# Patient Record
Sex: Female | Born: 1960
Health system: Southern US, Community
[De-identification: ages and names within clinical notes are randomized; demographics above are authoritative.]

## PROBLEM LIST (undated history)

## (undated) DIAGNOSIS — G930 Cerebral cysts: Secondary | ICD-10-CM

## (undated) DIAGNOSIS — M1990 Unspecified osteoarthritis, unspecified site: Secondary | ICD-10-CM

## (undated) DIAGNOSIS — Z98811 Dental restoration status: Secondary | ICD-10-CM

## (undated) DIAGNOSIS — R1310 Dysphagia, unspecified: Secondary | ICD-10-CM

## (undated) DIAGNOSIS — F419 Anxiety disorder, unspecified: Secondary | ICD-10-CM

## (undated) DIAGNOSIS — K219 Gastro-esophageal reflux disease without esophagitis: Secondary | ICD-10-CM

## (undated) DIAGNOSIS — J302 Other seasonal allergic rhinitis: Secondary | ICD-10-CM

## (undated) DIAGNOSIS — Z923 Personal history of irradiation: Secondary | ICD-10-CM

## (undated) DIAGNOSIS — C50919 Malignant neoplasm of unspecified site of unspecified female breast: Secondary | ICD-10-CM

## (undated) DIAGNOSIS — M810 Age-related osteoporosis without current pathological fracture: Secondary | ICD-10-CM

## (undated) DIAGNOSIS — H409 Unspecified glaucoma: Secondary | ICD-10-CM

## (undated) DIAGNOSIS — F458 Other somatoform disorders: Secondary | ICD-10-CM

## (undated) DIAGNOSIS — R198 Other specified symptoms and signs involving the digestive system and abdomen: Secondary | ICD-10-CM

## (undated) DIAGNOSIS — Z9221 Personal history of antineoplastic chemotherapy: Secondary | ICD-10-CM

## (undated) DIAGNOSIS — E039 Hypothyroidism, unspecified: Secondary | ICD-10-CM

## (undated) DIAGNOSIS — T4145XA Adverse effect of unspecified anesthetic, initial encounter: Secondary | ICD-10-CM

## (undated) DIAGNOSIS — G43909 Migraine, unspecified, not intractable, without status migrainosus: Secondary | ICD-10-CM

## (undated) DIAGNOSIS — M84376A Stress fracture, unspecified foot, initial encounter for fracture: Secondary | ICD-10-CM

## (undated) HISTORY — DX: Unspecified osteoarthritis, unspecified site: M19.90

## (undated) HISTORY — DX: Gastro-esophageal reflux disease without esophagitis: K21.9

## (undated) HISTORY — DX: Malignant neoplasm of unspecified site of unspecified female breast: C50.919

## (undated) HISTORY — PX: BREAST LUMPECTOMY: SHX2

## (undated) HISTORY — PX: TONSILLECTOMY: SUR1361

## (undated) HISTORY — PX: ESOPHAGOGASTRODUODENOSCOPY ENDOSCOPY: SHX5814

## (undated) HISTORY — PX: LASIK: SHX215

## (undated) HISTORY — PX: TOOTH EXTRACTION: SUR596

## (undated) HISTORY — DX: Cerebral cysts: G93.0

## (undated) HISTORY — DX: Age-related osteoporosis without current pathological fracture: M81.0

---

## 1997-10-13 ENCOUNTER — Other Ambulatory Visit: Admission: RE | Admit: 1997-10-13 | Discharge: 1997-10-13 | Payer: Self-pay | Admitting: *Deleted

## 1998-02-09 ENCOUNTER — Other Ambulatory Visit: Admission: RE | Admit: 1998-02-09 | Discharge: 1998-02-09 | Payer: Self-pay | Admitting: *Deleted

## 1998-09-14 ENCOUNTER — Other Ambulatory Visit: Admission: RE | Admit: 1998-09-14 | Discharge: 1998-09-14 | Payer: Self-pay | Admitting: *Deleted

## 1999-02-01 ENCOUNTER — Other Ambulatory Visit: Admission: RE | Admit: 1999-02-01 | Discharge: 1999-02-01 | Payer: Self-pay | Admitting: *Deleted

## 1999-05-31 ENCOUNTER — Other Ambulatory Visit: Admission: RE | Admit: 1999-05-31 | Discharge: 1999-05-31 | Payer: Self-pay | Admitting: *Deleted

## 1999-11-29 ENCOUNTER — Other Ambulatory Visit: Admission: RE | Admit: 1999-11-29 | Discharge: 1999-11-29 | Payer: Self-pay | Admitting: *Deleted

## 2000-05-22 ENCOUNTER — Other Ambulatory Visit: Admission: RE | Admit: 2000-05-22 | Discharge: 2000-05-22 | Payer: Self-pay | Admitting: *Deleted

## 2000-06-20 ENCOUNTER — Other Ambulatory Visit: Admission: RE | Admit: 2000-06-20 | Discharge: 2000-06-20 | Payer: Self-pay | Admitting: *Deleted

## 2000-06-20 ENCOUNTER — Encounter (INDEPENDENT_AMBULATORY_CARE_PROVIDER_SITE_OTHER): Payer: Self-pay | Admitting: Specialist

## 2001-03-12 ENCOUNTER — Other Ambulatory Visit: Admission: RE | Admit: 2001-03-12 | Discharge: 2001-03-12 | Payer: Self-pay | Admitting: *Deleted

## 2001-09-03 ENCOUNTER — Other Ambulatory Visit: Admission: RE | Admit: 2001-09-03 | Discharge: 2001-09-03 | Payer: Self-pay | Admitting: *Deleted

## 2002-10-11 ENCOUNTER — Other Ambulatory Visit: Admission: RE | Admit: 2002-10-11 | Discharge: 2002-10-11 | Payer: Self-pay | Admitting: *Deleted

## 2003-08-19 ENCOUNTER — Ambulatory Visit (HOSPITAL_COMMUNITY): Admission: RE | Admit: 2003-08-19 | Discharge: 2003-08-19 | Payer: Self-pay | Admitting: *Deleted

## 2003-10-27 ENCOUNTER — Other Ambulatory Visit: Admission: RE | Admit: 2003-10-27 | Discharge: 2003-10-27 | Payer: Self-pay | Admitting: *Deleted

## 2004-10-04 ENCOUNTER — Ambulatory Visit (HOSPITAL_COMMUNITY): Admission: RE | Admit: 2004-10-04 | Discharge: 2004-10-04 | Payer: Self-pay | Admitting: *Deleted

## 2004-11-10 ENCOUNTER — Other Ambulatory Visit: Admission: RE | Admit: 2004-11-10 | Discharge: 2004-11-10 | Payer: Self-pay | Admitting: *Deleted

## 2005-10-12 ENCOUNTER — Ambulatory Visit (HOSPITAL_COMMUNITY): Admission: RE | Admit: 2005-10-12 | Discharge: 2005-10-12 | Payer: Self-pay | Admitting: *Deleted

## 2005-12-12 ENCOUNTER — Other Ambulatory Visit: Admission: RE | Admit: 2005-12-12 | Discharge: 2005-12-12 | Payer: Self-pay | Admitting: Obstetrics and Gynecology

## 2006-10-20 ENCOUNTER — Ambulatory Visit (HOSPITAL_COMMUNITY): Admission: RE | Admit: 2006-10-20 | Discharge: 2006-10-20 | Payer: Self-pay | Admitting: Obstetrics and Gynecology

## 2006-12-15 ENCOUNTER — Other Ambulatory Visit: Admission: RE | Admit: 2006-12-15 | Discharge: 2006-12-15 | Payer: Self-pay | Admitting: Obstetrics & Gynecology

## 2006-12-22 ENCOUNTER — Ambulatory Visit (HOSPITAL_COMMUNITY): Admission: RE | Admit: 2006-12-22 | Discharge: 2006-12-22 | Payer: Self-pay | Admitting: Obstetrics & Gynecology

## 2007-11-02 ENCOUNTER — Ambulatory Visit (HOSPITAL_COMMUNITY): Admission: RE | Admit: 2007-11-02 | Discharge: 2007-11-02 | Payer: Self-pay | Admitting: Obstetrics & Gynecology

## 2007-11-21 LAB — CONVERTED CEMR LAB: Pap Smear: NORMAL

## 2007-12-17 ENCOUNTER — Other Ambulatory Visit: Admission: RE | Admit: 2007-12-17 | Discharge: 2007-12-17 | Payer: Self-pay | Admitting: Obstetrics and Gynecology

## 2008-10-15 ENCOUNTER — Encounter: Admission: RE | Admit: 2008-10-15 | Discharge: 2008-10-15 | Payer: Self-pay | Admitting: Specialist

## 2008-11-03 ENCOUNTER — Ambulatory Visit (HOSPITAL_COMMUNITY): Admission: RE | Admit: 2008-11-03 | Discharge: 2008-11-03 | Payer: Self-pay | Admitting: Obstetrics and Gynecology

## 2008-11-07 ENCOUNTER — Encounter: Payer: Self-pay | Admitting: Internal Medicine

## 2009-01-13 ENCOUNTER — Ambulatory Visit: Payer: Self-pay | Admitting: Internal Medicine

## 2009-01-13 DIAGNOSIS — R519 Headache, unspecified: Secondary | ICD-10-CM | POA: Insufficient documentation

## 2009-01-13 DIAGNOSIS — R51 Headache: Secondary | ICD-10-CM | POA: Insufficient documentation

## 2009-01-13 DIAGNOSIS — J309 Allergic rhinitis, unspecified: Secondary | ICD-10-CM | POA: Insufficient documentation

## 2009-01-13 DIAGNOSIS — H409 Unspecified glaucoma: Secondary | ICD-10-CM | POA: Insufficient documentation

## 2009-01-21 ENCOUNTER — Ambulatory Visit: Payer: Self-pay | Admitting: Internal Medicine

## 2009-01-21 LAB — CONVERTED CEMR LAB
ALT: 19 units/L (ref 0–35)
AST: 22 units/L (ref 0–37)
Albumin: 3.7 g/dL (ref 3.5–5.2)
Alkaline Phosphatase: 43 units/L (ref 39–117)
BUN: 13 mg/dL (ref 6–23)
Basophils Relative: 0.3 % (ref 0.0–3.0)
Bilirubin, Direct: 0 mg/dL (ref 0.0–0.3)
CO2: 30 meq/L (ref 19–32)
Calcium: 8.5 mg/dL (ref 8.4–10.5)
Chloride: 108 meq/L (ref 96–112)
Cholesterol: 166 mg/dL (ref 0–200)
Creatinine, Ser: 0.7 mg/dL (ref 0.4–1.2)
Eosinophils Relative: 2.4 % (ref 0.0–5.0)
GFR calc non Af Amer: 95.01 mL/min (ref >60)
Glucose, Bld: 90 mg/dL (ref 70–99)
HCT: 39.7 % (ref 36.0–46.0)
HDL: 57.7 mg/dL (ref >39.00)
Hemoglobin: 13.6 g/dL (ref 12.0–15.0)
LDL Cholesterol: 95 mg/dL (ref 0–99)
Lymphocytes Relative: 30.8 % (ref 12.0–46.0)
MCHC: 34.2 g/dL (ref 30.0–36.0)
MCV: 95.8 fL (ref 78.0–100.0)
Monocytes Relative: 7.8 % (ref 3.0–12.0)
Neutrophils Relative %: 58.7 % (ref 43.0–77.0)
Platelets: 285 10*3/uL (ref 150.0–400.0)
Potassium: 4.1 meq/L (ref 3.5–5.1)
RBC: 4.15 M/uL (ref 3.87–5.11)
RDW: 13.6 % (ref 11.5–14.6)
Sodium: 140 meq/L (ref 135–145)
TSH: 0.73 microintl units/mL (ref 0.35–5.50)
Total Bilirubin: 1.1 mg/dL (ref 0.3–1.2)
Total CHOL/HDL Ratio: 3
Total Protein: 6.9 g/dL (ref 6.0–8.3)
Triglycerides: 66 mg/dL (ref 0.0–149.0)
VLDL: 13.2 mg/dL (ref 0.0–40.0)
WBC: 5.2 10*3/uL (ref 4.5–10.5)

## 2009-01-27 LAB — CONVERTED CEMR LAB: Varicella IgG: 2.67 — ABNORMAL HIGH

## 2009-03-10 ENCOUNTER — Ambulatory Visit: Payer: Self-pay | Admitting: Internal Medicine

## 2009-03-10 DIAGNOSIS — N390 Urinary tract infection, site not specified: Secondary | ICD-10-CM | POA: Insufficient documentation

## 2009-03-10 LAB — CONVERTED CEMR LAB
Bilirubin Urine: NEGATIVE
Glucose, Urine, Semiquant: NEGATIVE
Ketones, urine, test strip: NEGATIVE
Nitrite: NEGATIVE
Protein, U semiquant: NEGATIVE
Specific Gravity, Urine: 1.01
Urobilinogen, UA: 0.2
pH: 5.5

## 2009-03-23 ENCOUNTER — Ambulatory Visit: Payer: Self-pay | Admitting: Internal Medicine

## 2009-03-23 LAB — CONVERTED CEMR LAB
Bilirubin Urine: NEGATIVE
Glucose, Urine, Semiquant: NEGATIVE
Ketones, urine, test strip: NEGATIVE
Nitrite: NEGATIVE
Protein, U semiquant: NEGATIVE
Specific Gravity, Urine: 1.01
Urobilinogen, UA: 0.2
WBC Urine, dipstick: NEGATIVE
pH: 6

## 2009-05-17 ENCOUNTER — Emergency Department (HOSPITAL_COMMUNITY): Admission: EM | Admit: 2009-05-17 | Discharge: 2009-05-17 | Payer: Self-pay | Admitting: Emergency Medicine

## 2009-06-26 ENCOUNTER — Ambulatory Visit: Payer: Self-pay | Admitting: Internal Medicine

## 2009-07-31 ENCOUNTER — Ambulatory Visit: Payer: Self-pay | Admitting: Family Medicine

## 2009-07-31 LAB — CONVERTED CEMR LAB
Bilirubin Urine: NEGATIVE
Blood in Urine, dipstick: NEGATIVE
Glucose, Urine, Semiquant: NEGATIVE
Ketones, urine, test strip: NEGATIVE
Nitrite: NEGATIVE
Protein, U semiquant: NEGATIVE
Specific Gravity, Urine: 1.01
Urobilinogen, UA: 0.2
pH: 5.5

## 2009-08-01 ENCOUNTER — Encounter: Payer: Self-pay | Admitting: Internal Medicine

## 2009-10-05 ENCOUNTER — Ambulatory Visit: Payer: Self-pay | Admitting: Internal Medicine

## 2009-10-05 DIAGNOSIS — M549 Dorsalgia, unspecified: Secondary | ICD-10-CM | POA: Insufficient documentation

## 2009-10-05 LAB — CONVERTED CEMR LAB
Bilirubin Urine: NEGATIVE
Glucose, Urine, Semiquant: NEGATIVE
Ketones, urine, test strip: NEGATIVE
Nitrite: NEGATIVE
Protein, U semiquant: NEGATIVE
Specific Gravity, Urine: 1.01
Urobilinogen, UA: 0.2
pH: 7

## 2009-10-23 ENCOUNTER — Telehealth: Payer: Self-pay | Admitting: *Deleted

## 2009-11-06 ENCOUNTER — Ambulatory Visit (HOSPITAL_COMMUNITY): Admission: RE | Admit: 2009-11-06 | Discharge: 2009-11-06 | Payer: Self-pay | Admitting: Obstetrics and Gynecology

## 2010-01-04 ENCOUNTER — Ambulatory Visit: Payer: Self-pay | Admitting: Internal Medicine

## 2010-04-22 ENCOUNTER — Ambulatory Visit: Payer: Self-pay | Admitting: Sports Medicine

## 2010-04-22 DIAGNOSIS — M25539 Pain in unspecified wrist: Secondary | ICD-10-CM | POA: Insufficient documentation

## 2010-04-22 DIAGNOSIS — M84376A Stress fracture, unspecified foot, initial encounter for fracture: Secondary | ICD-10-CM | POA: Insufficient documentation

## 2010-07-12 ENCOUNTER — Ambulatory Visit: Payer: Self-pay | Admitting: Internal Medicine

## 2010-07-12 DIAGNOSIS — R1310 Dysphagia, unspecified: Secondary | ICD-10-CM | POA: Insufficient documentation

## 2010-07-12 DIAGNOSIS — K219 Gastro-esophageal reflux disease without esophagitis: Secondary | ICD-10-CM

## 2010-07-12 HISTORY — DX: Gastro-esophageal reflux disease without esophagitis: K21.9

## 2010-07-13 ENCOUNTER — Encounter: Payer: Self-pay | Admitting: Gastroenterology

## 2010-07-21 ENCOUNTER — Encounter (INDEPENDENT_AMBULATORY_CARE_PROVIDER_SITE_OTHER): Payer: Self-pay | Admitting: *Deleted

## 2010-07-21 ENCOUNTER — Ambulatory Visit: Payer: Self-pay | Admitting: Gastroenterology

## 2010-07-21 DIAGNOSIS — R110 Nausea: Secondary | ICD-10-CM | POA: Insufficient documentation

## 2010-08-22 DIAGNOSIS — T8859XA Other complications of anesthesia, initial encounter: Secondary | ICD-10-CM

## 2010-08-22 HISTORY — DX: Other complications of anesthesia, initial encounter: T88.59XA

## 2010-09-06 ENCOUNTER — Ambulatory Visit: Admit: 2010-09-06 | Payer: Self-pay | Admitting: Gastroenterology

## 2010-09-23 NOTE — Progress Notes (Signed)
Summary: antibiotic yet  Phone Note Call from Patient Call back at Work Phone [phone]   Call For: Bonnie Ward Summary of Call: Dr. Demetrius Charity to be figuring out antibiotic for me.  Has she done it?  [facility] Initial call taken by: Rudy Jew, RN,  October 23, 2009 2:21 PM  Follow-up for Phone Call        Left message for pt call back. I also spoke with Gwenn to see if culture was ordered. She is checking on this. Follow-up by: Romualdo Bolk, CMA Duncan Dull),  October 23, 2009 2:32 PM  Additional Follow-up for Phone Call Additional follow up Details #1::        Culture wasn't ordered in EMR therefore it was not done. Additional Follow-up by: Rita Ohara,  October 23, 2009 3:06 PM    Additional Follow-up for Phone Call Additional follow up Details #2::    Left message for pt to call back to give [place] a update on her symptoms. Romualdo Bolk, CMA (AAMA)  October 23, 2009 3:58 PM LMTOCB Romualdo Bolk, CMA Duncan Dull)  October 28, 2009 11:32 AM Pt called back saying that she is not having any symptoms but is wondering if we were going to call in a antibiotic to take occ. for uti's. Follow-up by: Romualdo Bolk, CMA Duncan Dull),  October 28, 2009 1:46 PM  Additional Follow-up for Phone Call Additional follow up Details #3:: Details for Additional Follow-up Action Taken: apologize for the delay. Last germ had resistance  so go ahead and use macrobid 100  1 by mouth with  IC for uti prevention. disp 30 refill x 3   if gets new uti symptom would recheck her then . Additional Follow-up by: Madelin Headings MD,  October 29, 2009 8:24 AM  New/Updated Medications: NITROFURANTOIN MONOHYD MACRO 100 MG CAPS (NITROFURANTOIN MONOHYD MACRO) 1 by mouth once daily as needed for prevention of uti Prescriptions: NITROFURANTOIN MONOHYD MACRO 100 MG CAPS (NITROFURANTOIN MONOHYD MACRO) 1 by mouth once daily as needed for prevention of uti  #30 x 3   Entered by:   Romualdo Bolk, CMA (AAMA)   Authorized  by:   Madelin Headings MD   Signed by:   Romualdo Bolk, CMA (AAMA) on 10/29/2009   Method used:   Electronically to        [facility]. [address] [phone]* (retail)       [address]       [place], [place]  [address]       Ph: [phone] or [phone]       Fax: [phone]   RxID:   [id number]  Left message on machine that rx has been called into pharmacy. Romualdo Bolk, CMA Duncan Dull)  October 29, 2009 9:56 AM

## 2010-09-23 NOTE — Letter (Signed)
Summary: [facility] Instructions  [facility]  [address] [place], [place] [address]   Phone: [phone]  Fax: [phone]       Bonnie Ward    1960/10/24    MRN: [id number]        Procedure Day /Date:09/06/10  MON     Arrival Time:1 pm      Procedure Time:2 pm     Location of Procedure:                    X  [facility] (4th Floor)   PREPARATION FOR COLONOSCOPY WITH MOVIPREP   Starting 5 days prior to your procedure 09/01/10 do not eat nuts, seeds, popcorn, corn, beans, peas,  salads, or any raw vegetables.  Do not take any fiber supplements (e.g. Metamucil, Citrucel, and Benefiber).  THE DAY BEFORE YOUR PROCEDURE         DATE: 09/05/10  DAY: SUN  1.  Drink clear liquids the entire day-NO SOLID FOOD  2.  Do not drink anything colored red or purple.  Avoid juices with pulp.  No orange juice.  3.  Drink at least 64 oz. (8 glasses) of fluid/clear liquids during the day to prevent dehydration and help the prep work efficiently.  CLEAR LIQUIDS INCLUDE: Water Jello Ice Popsicles Tea (sugar ok, no milk/cream) Powdered fruit flavored drinks Coffee (sugar ok, no milk/cream) Gatorade Juice: apple, white grape, white cranberry  Lemonade Clear bullion, consomm, broth Carbonated beverages (any kind) Strained chicken noodle soup Hard Candy                             4.  In the morning, mix first dose of MoviPrep solution:    Empty 1 Pouch A and 1 Pouch B into the disposable container    Add lukewarm drinking water to the top line of the container. Mix to dissolve    Refrigerate (mixed solution should be used within 24 hrs)  5.  Begin drinking the prep at 5:00 p.m. The MoviPrep container is divided by 4 marks.   Every 15 minutes drink the solution down to the next mark (approximately 8 oz) until the full liter is complete.   6.  Follow completed prep with 16 oz of clear liquid of your choice (Nothing red or purple).  Continue to drink clear  liquids until bedtime.  7.  Before going to bed, mix second dose of MoviPrep solution:    Empty 1 Pouch A and 1 Pouch B into the disposable container    Add lukewarm drinking water to the top line of the container. Mix to dissolve    Refrigerate  THE DAY OF YOUR PROCEDURE      DATE: 09/06/10 DAY: MON  Beginning at 9 a.m. (5 hours before procedure):         1. Every 15 minutes, drink the solution down to the next mark (approx 8 oz) until the full liter is complete.  2. Follow completed prep with 16 oz. of clear liquid of your choice.    3. You may drink clear liquids until 12 noon (2 HOURS BEFORE PROCEDURE).   MEDICATION INSTRUCTIONS  Unless otherwise instructed, you should take regular prescription medications with a small sip of water   as early as possible the morning of your procedure.           OTHER INSTRUCTIONS  You will need a responsible adult at least  50 years of age to accompany you and drive you home.   This person must remain in the waiting room during your procedure.  Wear loose fitting clothing that is easily removed.  Leave jewelry and other valuables at home.  However, you may wish to bring a book to read or  an iPod/MP3 player to listen to music as you wait for your procedure to start.  Remove all body piercing jewelry and leave at home.  Total time from sign-in until discharge is approximately 2-3 hours.  You should go home directly after your procedure and rest.  You can resume normal activities the  day after your procedure.  The day of your procedure you should not:   Drive   Make legal decisions   Operate machinery   Drink alcohol   Return to work  You will receive specific instructions about eating, activities and medications before you leave.    The above instructions have been reviewed and explained to me by   _______________________    I fully understand and can verbalize these instructions _____________________________  Date _________

## 2010-09-23 NOTE — Assessment & Plan Note (Signed)
Summary: POSSIBLE STRESS FX LEFT FOOT TOP/ R WRIST PAIN X 1 WK   Vital Signs:  Patient profile:   50 year old female Menstrual status:  irregular BP sitting:   136 / 83  Vitals Entered By: Lillia Pauls CMA (April 22, 2010 8:35 AM)  History of Present Illness: 50yo female with c/o L foot & R wrist/hand pain. L foot/2nd MT pain since 02/22/10 when did walk/runs 3-days in a row. To [facility] a few weeks ago, dx with likely stress fx.  Wore Post-op shoe 2-3 weeks, but not wearing any more. Notes swelling of foot that is worse after end of the day.  Notes splaying of toes.  Denies numbness or tingling of the feet. Has been doing spin classes & palates without discomfort.  On her feet all day as hair dressor. Has been using tylenol. Similar episode about 1-year ago that lasted about 70-month but spontaneously resolved. Hx of broken wrist 2-years ago at step class when fell.  No hx stress fx. Hx of osteopenia based on DEXA scan 2-yrs ago - taking Calcium 1200mg  & Vit D 2000 units daily.  R wrist pain/swelling x 1-wk.  Pt is R handed & works as Producer, television/film/video.Marland Kitchen Hx of fx 2-yrs ago. No recent injury/trauma. Notes dorsal swelling on ulnar aspect of hand. No numbness/tingling.  No weakness. Not taking any medications. Not using any brace. Aggrevating her at work & with daily activities.   Allergies: No Known Drug Allergies  Review of Systems      See HPI  Physical Exam  General:  Well-developed,well-nourished,in no acute distress; alert,appropriate and cooperative throughout examination Msk:  L FOOT: mod forefoot collapse.  Small amount of swelling over dorsal aspect 2nd & 3rd MT.  TTP over distal 2nd MT, mild TTP over distal 3rd MT.  No tenderness over MT heads on plantar aspect of foot.  Rt foot without swelling, tenderness, defomity.  She has normal stability of ankles b/l.  Normal ankle ROM & strength.  R WRIST: mild amount of swelling over ulnar styloid.  Full ROM without pain.  Mild  TTP over ulnar styloid & over tendons of extensor carpi ulnaris.  No tenderness over TFCC.  Wrist strength +5/5, but did have some pain with resisted ulnar deviation & wrist extension. Left wrist with FROM without pain, swelling, tenderness, weakness. Pulses:  +2/4 radial b/l Neurologic:  sensation intact to light touch.   Additional Exam:  MSK U/S - L foot: evidence of healing 2nd MT stress fx (approx 90% healed), (+)cap sign on transverse view with good callous formation.  No significant increased blood flow visualized on doppler.  3rd MT with mild irregularity, no clear fx.  Small amount of surrounding edema at 3rd MT head.  Images saved.   Impression & Recommendations:  Problem # 1:  STRESS FRACTURE OF THE METATARSALS (ZOX-096.04) Assessment New  - Lt 2nd MT stress fx, DOI  ~02/22/10, approx 90% healed at this time, confirmed with MSK u/s today - Fitted with arch strap in office today - Small MT pads placed in sandles.  Small MT pads placed on Sports Insoles, but pt does not have shoes here in the office to confirm fit.  Should return to office if insoles do not fit in shoes or if need MT pads adjusted. - Cont. Ca + Vit D - f/u 4-wks  Orders: [place] LIMITED ([address]) Foot Orthosis ( Arch Strap/Heel Cup) [phone]) Foot Insert [phone]) Sports Insoles [phone])  Problem # 2:  FOOT  PAIN, LEFT (ICD-729.5)  - Secondary to stress fx.  See above  Orders: [place] LIMITED ([address]) Foot Orthosis ( Arch Strap/Heel Cup) [phone]) Foot Insert (U9811) Sports Insoles [phone])  Problem # 3:  WRIST PAIN, RIGHT (ICD-719.43) - Exam c/w mild tendonitis of Extensor Carpi Ulnaris - Fitted with Wrist thumb loop brace in office today.  Should wear at work, ok to get wet. - Reviewed hand/wrist exercises to do after work with light weight - wrist extensions, supination/pronation exercises. - May use tylenol prn  Orders: Upper Limb Ortosis (wrist loop, arm band, finger splint) (G9562)  Complete Medication List: 1)   Maxalt-mlt 10 Mg Tbdp (Rizatriptan benzoate) .Marland Kitchen.. 1 by mouth at onset of migraine  can repeat in 2-4 hours 2)  Nitrofurantoin Monohyd Macro 100 Mg Caps (Nitrofurantoin monohyd macro) .Marland Kitchen.. 1 by mouth once daily as needed for prevention of uti 3)  Amoxicillin-pot Clavulanate 875-125 Mg Tabs (Amoxicillin-pot clavulanate) .Marland Kitchen.. 1 by mouth two times a day  for sinustis 4)  Fluticasone Propionate 50 Mcg/act Susp (Fluticasone propionate) .... 2 sprays each nares once daily  for allergy control 5)  Fluconazole 150 Mg Tabs (Fluconazole) .Marland Kitchen.. 1 by mouth and can repeat in 3 days

## 2010-09-23 NOTE — Letter (Signed)
Summary: New Patient letter  [facility]  [address] [place], [place] [address]   Phone: [phone]  Fax: [phone]       07/13/2010 MRN: [id number]  [facility] [address] [place], [place]  [address]  Dear Ms. Cardin,  Welcome to the Gastroenterology Division at [facility].    You are scheduled to see Dr.  Christella Hartigan on 07-21-10 at 9:15am on the 3rd floor at [facility], [address]  We ask that you try to arrive at our office 15 minutes prior to your appointment time to allow for check-in.  We would like you to complete the enclosed self-administered evaluation form prior to your visit and bring it with you on the day of your appointment.  We will review it with you.  Also, please bring a complete list of all your medications or, if you prefer, bring the medication bottles and we will list them.  Please bring your insurance card so that we may make a copy of it.  If your insurance requires a referral to see a specialist, please bring your referral form from your primary care physician.  Co-payments are due at the time of your visit and may be paid by cash, check or credit card.     Your office visit will consist of a consult with your physician (includes a physical exam), any laboratory testing he/she may order, scheduling of any necessary diagnostic testing (e.g. x-ray, ultrasound, CT-scan), and scheduling of a procedure (e.g. Endoscopy, Colonoscopy) if required.  Please allow enough time on your schedule to allow for any/all of these possibilities.    If you cannot keep your appointment, please call [phone] to cancel or reschedule prior to your appointment date.  This allows [place] the opportunity to schedule an appointment for another patient in need of care.  If you do not cancel or reschedule by 5 p.m. the business day prior to your appointment date, you will be charged a $50.00 late cancellation/no-show fee.    Thank you for choosing  [facility] for your medical needs.  We appreciate the opportunity to care for you.  Please visit [place] at our website  to learn more about our practice.                     Sincerely,                                                             The Gastroenterology Division

## 2010-09-23 NOTE — Assessment & Plan Note (Signed)
Summary: BLADDER INF/CJR   Vital Signs:  Patient profile:   50 year old female Menstrual status:  irregular LMP:     07/16/2009 Weight:      148 pounds Temp:     98.4 degrees F oral Pulse rate:   72 / minute BP sitting:   100 / 60  (right arm) Cuff size:   regular  Vitals Entered By: Romualdo Bolk, CMA (AAMA) (October 05, 2009 3:22 PM) CC: Burning upon urination and left sided back that started today. LMP (date): 07/16/2009 LMP - Character: heavy Menarche (age onset years): 11   Menses interval (days): varies Menstrual flow (days): 7 Enter LMP: 07/16/2009 Last PAP Result normal   History of Present Illness: Bonnie Ward comesin today for   for SDA for above .  Onset this am with dysuria  but ocould have been there longer. No rx   Also developed left back pain worse when getting up and down.  no radiation  . No fever. Consider IC prophylaxis . Last UTI in december. Recent plane travel from [place] .  Feels well off of  Hormones .  Has less and  no recent hot flushes    Preventive Screening-Counseling & Management  Alcohol-Tobacco     Alcohol drinks/day: <1     Alcohol type: all     Smoking Status: quit     Year Quit: 6 years ago  Caffeine-Diet-Exercise     Caffeine use/day: 1-2     Does Patient Exercise: yes     Type of exercise: pilates, spin class, step and running     Times/week: 5  Current Medications (verified): 1)  Maxalt-Mlt 10 Mg Tbdp (Rizatriptan Benzoate) .Marland Kitchen.. 1 By Mouth At Onset of Migraine  Can Repeat in 2-4 Hours  Allergies (verified): No Known Drug Allergies  Past History:  Past medical, surgical, family and social histories (including risk factors) reviewed, and no changes noted (except as noted below).  Past Medical History: Reviewed history from 06/26/2009 and no changes required. Headache Allergic rhinitis  seasonal  ? cats  Migraines on OCPs     No hx of varicella? ? recurrent UTIs  Left  frontal arachnoid cyst  Past Surgical  History: Reviewed history from 01/13/2009 and no changes required. Denies surgical history  Family History: Reviewed history from 01/13/2009 and no changes required. Father: type 2 DM, HBP, osteoporosis, arthritis  Mother: Pt of Dr. Laurita Quint, Osteoporosis, HBP, Hypothyroid , Siblings: Healthy  Social History: Reviewed history from 01/13/2009 and no changes required. Occupation: Hairstylist    Married  hhof 2  Former Smoker Alcohol use-yes Regular exercise-yes Development worker, international aid.   Husband smokes outside   Review of Systems  The patient denies anorexia, fever, weight loss, weight gain, chest pain, prolonged cough, abdominal pain, melena, hematochezia, severe indigestion/heartburn, hematuria, incontinence, difficulty walking, depression, abnormal bleeding, enlarged lymph nodes, and angioedema.         no period since off ocps  and feels better  no Gaylyn Rong now.   Physical Exam  General:  Well-developed,well-nourished,in no acute distress; alert,appropriate and cooperative throughout examination Head:  normocephalic and atraumatic.   Eyes:  nl  Ears:  no external deformities.   Nose:  no external deformity and no nasal discharge.   Neck:  No deformities, masses, or tenderness noted. Lungs:  normal respiratory effort and no intercostal retractions.   Heart:  normal rate, regular rhythm, no murmur, and no gallop.   Abdomen:  soft, no distention, no masses, no guarding, no  hepatomegaly, and no splenomegaly.  no flank pain Msk:  left LS area discomfort  with mild spasm gait normal Pulses:  nl cap refill  Neurologic:  non focal  Skin:  turgor normal, color normal, no ecchymoses, and no petechiae.   Cervical Nodes:  No lymphadenopathy noted Psych:  Oriented X3, not anxious appearing, and not depressed appearing.     Impression & Recommendations:  Problem # 1:  UTI'S, RECURRENT (ICD-599.0)  ic prophylaxis is sreasonable.    wait on culture to decide on best med.  Her updated medication list  for this problem includes:    Nitrofurantoin Monohyd Macro 100 Mg Caps (Nitrofurantoin monohyd macro) .Marland Kitchen... 1 by mouth two times a day for uti  Orders: UA Dipstick w/o Micro (automated)  (81003)  Encouraged to push clear liquids, get enough rest, and take acetaminophen as needed. To be seen in 10 days if no improvement, sooner if worse.  Problem # 2:  DYSURIA (ICD-788.1)  poss uti   Her updated medication list for this problem includes:    Nitrofurantoin Monohyd Macro 100 Mg Caps (Nitrofurantoin monohyd macro) .Marland Kitchen... 1 by mouth two times a day for uti  Encouraged to push clear liquids, get enough rest, and take acetaminophen as needed. To be seen in 10 days if no improvement, sooner if worse.  Problem # 3:  HEADACHE (ICD-784.0) better off hormones  Her updated medication list for this problem includes:    Maxalt-mlt 10 Mg Tbdp (Rizatriptan benzoate) .Marland Kitchen... 1 by mouth at onset of migraine  can repeat in 2-4 hours  Problem # 4:  BACK PAIN (ICD-724.5) seems MS cause  follow up if persistent and progressive    Complete Medication List: 1)  Maxalt-mlt 10 Mg Tbdp (Rizatriptan benzoate) .Marland Kitchen.. 1 by mouth at onset of migraine  can repeat in 2-4 hours 2)  Nitrofurantoin Monohyd Macro 100 Mg Caps (Nitrofurantoin monohyd macro) .Marland Kitchen.. 1 by mouth two times a day for uti  Patient Instructions: 1)  take  antibiotic  for uti   2)  depending. on the culture result   will begin  UTI prophylaxis.  3)  Will call this in. 4)  call if persistent and progressive  problem Ithik your back pain could be muscular strain.  Prescriptions: NITROFURANTOIN MONOHYD MACRO 100 MG CAPS (NITROFURANTOIN MONOHYD MACRO) 1 by mouth two times a day for UTI  #14 x 0   Entered and Authorized by:   Madelin Headings MD   Signed by:   Madelin Headings MD on 10/05/2009   Method used:   Electronically to        [facility]. [address] [phone]* (retail)       [address]       [place], [place]  [address]        Ph: [phone] or [phone]       Fax: [phone]   RxID:   [id number]   Laboratory Results   Urine Tests    Routine Urinalysis   Color: yellow Appearance: Clear Glucose: negative   (Normal Range: Negative) Bilirubin: negative   (Normal Range: Negative) Ketone: negative   (Normal Range: Negative) Spec. Gravity: 1.010   (Normal Range: 1.003-1.035) Blood: 2+   (Normal Range: Negative) pH: 7.0   (Normal Range: 5.0-8.0) Protein: negative   (Normal Range: Negative) Urobilinogen: 0.2   (Normal Range: 0-1) Nitrite: negative   (Normal Range: Negative) Leukocyte Esterace: trace   (Normal Range:  Negative)    Comments: Rita Ohara  October 05, 2009 3:33 PM

## 2010-09-23 NOTE — Assessment & Plan Note (Signed)
History of Present Illness Visit Type: Initial Consult Primary GI MD: Rob Bunting MD Primary Provider: Berniece Andreas, MD Chief Complaint: Dysphagia, Genella Rife History of Present Illness:     very pleasant 50 year old woman.  she noticed GERD like troubles over the past year, getting worse.  About a week, a lot of nausea.  Some solid food dysphagia (especially dry food).  Overall she has lost 2 pounds intentionally.  She has early satiety, over the past 1 year or so.    NO vomitting.  Can bloat after eating (knows she is lactose intolerant, takes lactase pills).  prilosec, started a week ago.  Has really helped a lot..nausea gone.  she takes it sporadically throughout the day.  can take 4-6 advil a week for migraines.  No overt GI bleeding.           Current Medications (verified): 1)  Maxalt-Mlt 10 Mg Tbdp (Rizatriptan Benzoate) .Marland Kitchen.. 1 By Mouth At Onset of Migraine  Can Repeat in 2-4 Hours 2)  Nitrofurantoin Monohyd Macro 100 Mg Caps (Nitrofurantoin Monohyd Macro) .Marland Kitchen.. 1 By Mouth Once Daily As Needed For Prevention of Uti 3)  Prilosec 20 Mg Cpdr (Omeprazole) .Marland Kitchen.. 1 By Mouth Once Daily or Two Times A Day  As Directed  Allergies (verified): No Known Drug Allergies  Past History:  Past Medical History: Headache Allergic rhinitis  seasonal  ? cats  Migraines on OCPs     No hx of varicella? ? recurrent UTIs  Left  frontal arachnoid cyst     Past Surgical History: Denies surgical history    Family History: Father: type 2 DM, HBP, osteoporosis, arthritis  Mother: Pt of Dr. Laurita Quint, Osteoporosis, HBP, Hypothyroid , Siblings: Healthy family   mom on nexium.  no colon cancer  Social History: Occupation: Hairstylist    Married  hhof 2  Former Smoker Alcohol use-yes Regular exercise-yes Development worker, international aid.   Husband smokes outside     Review of Systems       Pertinent positive and negative review of systems were noted in the above HPI and GI specific review of systems.  All  other review of systems was otherwise negative.   Vital Signs:  Patient profile:   50 year old female Menstrual status:  irregular Height:      66.5 inches Weight:      143.8 pounds BMI:     22.94 Pulse rate:   80 / minute Pulse rhythm:   regular BP sitting:   130 / 74  (left arm) Cuff size:   regular  Vitals Entered By: Harlow Mares CMA Khalea Ventura Dull) (July 21, 2010 9:15 AM)  Physical Exam  Additional Exam:  Constitutional: generally well appearing Psychiatric: alert and oriented times 3 Eyes: extraocular movements intact Mouth: oropharynx moist, no lesions Neck: supple, no lymphadenopathy Cardiovascular: heart regular rate and rythm Lungs: CTA bilaterally Abdomen: soft, non-tender, non-distended, no obvious ascites, no peritoneal signs, normal bowel sounds Extremities: no lower extremity edema bilaterally Skin: no lesions on visible extremities    Impression & Recommendations:  Problem # 1:  dyspepsia, dysphagia like symptoms this seems acid related since her symptoms have definitely improved on Prilosec once daily. She will continue that for now and I have given her a longer term prescription for it. We will proceed with EGD at her soonest convenience to check for hiatal hernia, peptic ulcer disease ( she was taking quite a few Advil a week), infection etc.  She was given a GERD handout as well  Problem #  2:  irritable bowel-like symptoms I did not mention above but she does have lower abdominal cramping, usually improved with BMs. She has seen a small amount of blood on the tissue paper at times. We will proceed with colonoscopy at the same time as her upper endoscopy.  Other Orders: Colon/Endo (Colon/Endo)  Patient Instructions: 1)  You should change the way you are taking your antiacid medicine (prilosec) so that you are taking it 20-30 minutes prior to a decent meal as that is the way the pill is designed to work most effectively. 2)  Continue cutting back on NSAIDs  as best you can. 3)  You will be scheduled to have an upper endoscopy. 4)  You will be scheduled to have a colonoscopy. 5)  GERD handout. 6)  The medication list was reviewed and reconciled.  All changed / newly prescribed medications were explained.  A complete medication list was provided to the patient / caregiver. Prescriptions: MOVIPREP 100 GM  SOLR (PEG-KCL-NACL-NASULF-NA ASC-C) As per prep instructions.  #1 x 0   Entered by:   Chales Abrahams CMA (AAMA)   Authorized by:   Rachael Fee MD   Signed by:   Rachael Fee MD on 07/21/2010   Method used:   Electronically to        [facility] [address] [phone]* (retail)       [address]       [place], [place]  [address]       Ph: [phone] or [phone]       Fax: [phone]   RxID:   [id number] PRILOSEC 20 MG CPDR (OMEPRAZOLE) 1 pill 20-30 min prior to BF meal  #30 x 11   Entered and Authorized by:   Rachael Fee MD   Signed by:   Rachael Fee MD on 07/21/2010   Method used:   Electronically to        [facility] [address] [phone]* (retail)       [address]       [place], [place]  [address]       Ph: [phone] or [phone]       Fax: [phone]   RxID:   [id number]

## 2010-09-23 NOTE — Assessment & Plan Note (Signed)
Summary: POSSIBLE URI AND FOLLOW UP ON MIGRAINE MEDICATION//SLM   Vital Signs:  Patient profile:   50 year old female Menstrual status:  irregular LMP:     12/26/2009 Height:      66.5 inches Weight:      145 pounds BMI:     23.14 Temp:     97.9 degrees F oral Pulse rate:   88 / minute BP sitting:   110 / 70  (right arm)  Vitals Entered By: Kathrynn Speed CMA (Jan 04, 2010 9:30 AM) CC: Fu migraines/ URI sore throat, tightness in chest, ear pain both x 1 wk LMP (date): 12/26/2009 LMP - Character: heavy Menarche (age onset years): 11   Menses interval (days): varies Menstrual flow (days): 7 Enter LMP: 12/26/2009 Last PAP Result normal   History of Present Illness: Latrenda Melena  comesin [place] appt because of resurgence of HA that had been in great control.    no period from nov to march  and no ha   with periods x 3 and no MHA . And then 4 days ago having migraine x 3 days  and now today ok .    ? trigger   ( after period)   mom in hip rehab  some stress .  She has had congestion for about amonth  and now ears hurt coughing some  and face pressure expecially when bends over .   and then congestion and cough and ear pain.    Had run out of maxalt .       Preventive Screening-Counseling & Management  Alcohol-Tobacco     Alcohol drinks/day: <1     Alcohol type: all     Smoking Status: quit     Year Quit: 6 years ago  Caffeine-Diet-Exercise     Caffeine use/day: 1-2     Does Patient Exercise: yes     Type of exercise: pilates, spin class, step and running     Times/week: 5  Current Medications (verified): 1)  Maxalt-Mlt 10 Mg Tbdp (Rizatriptan Benzoate) .Marland Kitchen.. 1 By Mouth At Onset of Migraine  Can Repeat in 2-4 Hours 2)  Nitrofurantoin Monohyd Macro 100 Mg Caps (Nitrofurantoin Monohyd Macro) .Marland Kitchen.. 1 By Mouth Once Daily As Needed For Prevention of Uti  Allergies (verified): No Known Drug Allergies  Past History:  Past medical, surgical, family and social histories  (including risk factors) reviewed for relevance to current acute and chronic problems.  Past Medical History: Reviewed history from 06/26/2009 and no changes required. Headache Allergic rhinitis  seasonal  ? cats  Migraines on OCPs     No hx of varicella? ? recurrent UTIs  Left  frontal arachnoid cyst  Past Surgical History: Reviewed history from 01/13/2009 and no changes required. Denies surgical history  Past History:  Care Management: Ophthalmology:  [name]  Neurosurgery: [name]   Dermatology:[name]  Gynecology:[name].  Urology: [name]  Family History: Reviewed history from 01/13/2009 and no changes required. Father: type 2 DM, HBP, osteoporosis, arthritis  Mother: Pt of Dr. Laurita Quint, Osteoporosis, HBP, Hypothyroid , Siblings: Healthy  Social History: Reviewed history from 01/13/2009 and no changes required. Occupation: Hairstylist    Married  hhof 2  Former Smoker Alcohol use-yes Regular exercise-yes Development worker, international aid.   Husband smokes outside   Review of Systems  The patient denies anorexia, fever, weight loss, weight gain, hoarseness, prolonged cough, and hemoptysis.         gets yeast infections with antibioitic s  Physical Exam  General:  Well-developed,well-nourished,in no acute distress; alert,appropriate and cooperative throughout examination Head:  normocephalic and atraumatic.   Eyes:  vision grossly intact.   Ears:  R ear normal and L ear normal.   Nose:  no external deformity and no external erythema.  congested   facial pressure   Mouth:  pharynx pink and moist.   Neck:  No deformities, masses, or tenderness noted. Lungs:  Normal respiratory effort, chest expands symmetrically. Lungs are clear to auscultation, no crackles or wheezes. Heart:  normal rate, regular rhythm, no murmur, and no gallop.   Pulses:  nl cpa refill  Neurologic:  grossly normal  Skin:  turgor normal, color normal, no ecchymoses, and no petechiae.   Cervical Nodes:  No  lymphadenopathy noted Psych:  Oriented X3, good eye contact, not anxious appearing, and not depressed appearing.     Impression & Recommendations:  Problem # 1:  SINUSITIS - ACUTE-NOS (ICD-461.9) add r rx if  needed on antibiotic  Her updated medication list for this problem includes:    Amoxicillin-pot Clavulanate 875-125 Mg Tabs (Amoxicillin-pot clavulanate) .Marland Kitchen... 1 by mouth two times a day  for sinustis    Fluticasone Propionate 50 Mcg/act Susp (Fluticasone propionate) .Marland Kitchen... 2 sprays each nares once daily  for allergy control  Problem # 2:  HEADACHE (ICD-784.0) migraine recurred  poss from above .  refill med Her updated medication list for this problem includes:    Maxalt-mlt 10 Mg Tbdp (Rizatriptan benzoate) .Marland Kitchen... 1 by mouth at onset of migraine  can repeat in 2-4 hours  Problem # 3:  ? of ALLERGIC RHINITIS, SEASONAL (ICD-477.0)  Complete Medication List: 1)  Maxalt-mlt 10 Mg Tbdp (Rizatriptan benzoate) .Marland Kitchen.. 1 by mouth at onset of migraine  can repeat in 2-4 hours 2)  Nitrofurantoin Monohyd Macro 100 Mg Caps (Nitrofurantoin monohyd macro) .Marland Kitchen.. 1 by mouth once daily as needed for prevention of uti 3)  Amoxicillin-pot Clavulanate 875-125 Mg Tabs (Amoxicillin-pot clavulanate) .Marland Kitchen.. 1 by mouth two times a day  for sinustis 4)  Fluticasone Propionate 50 Mcg/act Susp (Fluticasone propionate) .... 2 sprays each nares once daily  for allergy control 5)  Fluconazole 150 Mg Tabs (Fluconazole) .Marland Kitchen.. 1 by mouth and can repeat in 3 days  Patient Instructions: 1)  I think you have sinsusitis  aggravating your migraines  and poss allergy underlying this. 2)  will treat for infection and allergy   3)  limit the sudafed as this can  trigger migraine rebound headaches . 4)  expect improvment in 3-5 days   5)  call if needed and not getting better  Prescriptions: FLUCONAZOLE 150 MG TABS (FLUCONAZOLE) 1 by mouth and can repeat in 3 days  #2 x 1   Entered and Authorized by:   Madelin Headings MD    Signed by:   Madelin Headings MD on 01/04/2010   Method used:   Electronically to        [facility]. [address] [phone]* (retail)       [address]       [place], [place]  [address]       Ph: [phone] or [phone]       Fax: [phone]   RxID:   [id number] FLUTICASONE PROPIONATE 50 MCG/ACT SUSP (FLUTICASONE PROPIONATE) 2 sprays each nares once daily  for allergy control  #1 x PRN   Entered and Authorized by:   Madelin Headings MD   Signed by:  Madelin Headings MD on 01/04/2010   Method used:   Electronically to        [facility]. [address] [phone]* (retail)       [address]       [place], [place]  [address]       Ph: [phone] or [phone]       Fax: [phone]   RxID:   [id number] AMOXICILLIN-POT CLAVULANATE 875-125 MG TABS (AMOXICILLIN-POT CLAVULANATE) 1 by mouth two times a day  for sinustis  #20 x 0   Entered and Authorized by:   Madelin Headings MD   Signed by:   Madelin Headings MD on 01/04/2010   Method used:   Electronically to        [facility]. [address] [phone]* (retail)       [address]       [place], [place]  [address]       Ph: [phone] or [phone]       Fax: [phone]   RxID:   [id number] MAXALT-MLT 10 MG TBDP (RIZATRIPTAN BENZOATE) 1 by mouth at onset of migraine  can repeat in 2-4 hours  #9.0 Each x 3   Entered and Authorized by:   Madelin Headings MD   Signed by:   Madelin Headings MD on 01/04/2010   Method used:   Electronically to        [facility]. [address] [phone]* (retail)       [address]       [place], [place]  [address]       Ph: [phone] or [phone]       Fax: [phone]   RxID:   [id number]   Appended Document: POSSIBLE URI AND FOLLOW UP ON MIGRAINE MEDICATION//SLM tell patient that I reviewed her record and she should know that  nasal steroids possible can raise IOP ( intra ocular pressure)  over time  and she should ask her eye  doc if she can take the flonase on a regular basis for her poss nasal allergies.  Appended Document: POSSIBLE URI AND FOLLOW UP ON MIGRAINE MEDICATION//SLM LMTOCB  Appended Document: POSSIBLE URI AND FOLLOW UP ON MIGRAINE MEDICATION//SLM LMTOCB  Appended Document: POSSIBLE URI AND FOLLOW UP ON MIGRAINE MEDICATION//SLM Pt aware of results.

## 2010-09-23 NOTE — Assessment & Plan Note (Signed)
Summary: heartburn/njr   Vital Signs:  Patient profile:   50 year old female Menstrual status:  irregular LMP:     06/07/2010 Weight:      143 pounds Temp:     98.0 degrees F oral Pulse rate:   60 / minute BP sitting:   130 / 80  (left arm) Cuff size:   regular  Vitals Entered By: Romualdo Bolk, CMA (AAMA) (July 12, 2010 10:40 AM) CC: Pt is having bad heartburn and felt like she was going to throw up. Nausea, burning, lump in throat. Pt took pepto bismol and tums whiched help. Pt states that she couldn't keep her food down and doesn' t eat as much. She had it happen again today and took prilosec whiched helped. LMP (date): 06/07/2010 LMP - Character: heavy Menarche (age onset years): 11   Menses interval (days): varies Menstrual flow (days): 7 Enter LMP: 06/07/2010 Last PAP Result normal   History of Present Illness: Bonnie Ward comes in today  for increasing problem over the past 2 months. and last might was worse. ?  if had some gerd and then got a lot worse  over the past 2 months .   lots of stress.  last pm got a lot worse and after grilled cheese felt like food in throat  . better this am but  after ate bagel food stuck in  in upper chest .   hard to get food down and  almost  vomited last night  gagged.   No abd pain change in bowel habits . choked in vitamins recently nad in past sometimes.  No cough sob   but seems to have  Hiccoups" all the time ".   trying to lose  weight  has lost 3 pounds intentionally. In the past   5 years had had  intermittent mild GEERD signs and takes  otc 2 weeks  tums etc.     Preventive Screening-Counseling & Management  Alcohol-Tobacco     Alcohol drinks/day: <1     Alcohol type: all     Smoking Status: quit     Year Quit: 6 years ago  Caffeine-Diet-Exercise     Caffeine use/day: 1-2     Does Patient Exercise: yes     Type of exercise: pilates, spin class, step and running     Times/week: 5  Current Problems  (verified): 1)  Acid Reflux Disease  (ICD-530.81) 2)  Dysphagia Unspecified  (ICD-787.20) 3)  Wrist Pain, Right  (ICD-719.43) 4)  Stress Fracture of The Metatarsals  (ICD-733.94) 5)  ? of Allergic Rhinitis, Seasonal  (ICD-477.0) 6)  Back Pain  (ICD-724.5) 7)  Uti's, Recurrent  (ICD-599.0) 8)  Physical Examination  (ICD-V70.0) 9)  Glaucoma, Left Eye  (ICD-365.9) 10)  Allergic Rhinitis  (ICD-477.9) 11)  Arachnoid Cyst-left Frontal  (ICD-348.0) 12)  Headache  (ICD-784.0)  Current Medications (verified): 1)  Maxalt-Mlt 10 Mg Tbdp (Rizatriptan Benzoate) .Marland Kitchen.. 1 By Mouth At Onset of Migraine  Can Repeat in 2-4 Hours 2)  Nitrofurantoin Monohyd Macro 100 Mg Caps (Nitrofurantoin Monohyd Macro) .Marland Kitchen.. 1 By Mouth Once Daily As Needed For Prevention of Uti 3)  Fluconazole 150 Mg Tabs (Fluconazole) .Marland Kitchen.. 1 By Mouth and Can Repeat in 3 Days  Allergies (verified): No Known Drug Allergies  Past History:  Past medical, surgical, family and social histories (including risk factors) reviewed, and no changes noted (except as noted below).  Past Medical History: Reviewed history from 06/26/2009 and no changes required.  Headache Allergic rhinitis  seasonal  ? cats  Migraines on OCPs     No hx of varicella? ? recurrent UTIs  Left  frontal arachnoid cyst  Past Surgical History: Reviewed history from 01/13/2009 and no changes required. Denies surgical history  Past History:  Care Management: Ophthalmology:  [name]  Neurosurgery: [name]   Dermatology:[name]  Gynecology:[name].  Urology: [name]  Family History: Reviewed history from 01/13/2009 and no changes required. Father: type 2 DM, HBP, osteoporosis, arthritis  Mother: Pt of Dr. Laurita Quint, Osteoporosis, HBP, Hypothyroid , Siblings: Healthy  family   mom on nexium.   Social History: Reviewed history from 01/13/2009 and no changes required. Occupation: Hairstylist    Married  hhof 2  Former Smoker Alcohol use-yes Regular  exercise-yes Development worker, international aid.   Husband smokes outside   Review of Systems       The patient complains of anorexia.  The patient denies fever, weight gain, vision loss, syncope, dyspnea on exertion, prolonged cough, abdominal pain, melena, hematochezia, hematuria, transient blindness, difficulty walking, abnormal bleeding, and enlarged lymph nodes.    Physical Exam  General:  alert, well-developed, well-nourished, and well-hydrated.   Head:  normocephalic and atraumatic.   Neck:  No deformities, masses, or tenderness noted. Lungs:  Normal respiratory effort, chest expands symmetrically. Lungs are clear to auscultation, no crackles or wheezes. Heart:  Normal rate and regular rhythm. S1 and S2 normal without gallop, murmur, click, rub or other extra sounds. Abdomen:  Bowel sounds positive,abdomen soft and non-tender without masses, organomegaly or   noted. Extremities:  no clubbing cyanosis or edema  Neurologic:  grossly normal  Skin:  turgor normal, color normal, no ecchymoses, and no petechiae.   Cervical Nodes:  No lymphadenopathy noted Psych:  Oriented X3, good eye contact, not anxious appearing, and not depressed appearing.     Impression & Recommendations:  Problem # 1:  DYSPHAGIA UNSPECIFIED (ICD-787.20) Assessment New concern about  structural dysphagia with hx of gerd signs off and on for a whhile and pill and solid food dysphagia.  and hiccoughs.  no other . No weight loss related to this .     rec need Gi consult and most likely endoscopy .  counseled  and Expectant management .    Orders: Gastroenterology Referral (GI)  Problem # 2:  ACID REFLUX DISEASE (ICD-530.81) see above.   Her updated medication list for this problem includes:    Prilosec 20 Mg Cpdr (Omeprazole) .Marland Kitchen... 1 by mouth once daily or two times a day  as directed  Orders: Gastroenterology Referral (GI)  Complete Medication List: 1)  Maxalt-mlt 10 Mg Tbdp (Rizatriptan benzoate) .Marland Kitchen.. 1 by mouth at onset of  migraine  can repeat in 2-4 hours 2)  Nitrofurantoin Monohyd Macro 100 Mg Caps (Nitrofurantoin monohyd macro) .Marland Kitchen.. 1 by mouth once daily as needed for prevention of uti 3)  Fluconazole 150 Mg Tabs (Fluconazole) .Marland Kitchen.. 1 by mouth and can repeat in 3 days 4)  Prilosec 20 Mg Cpdr (Omeprazole) .Marland Kitchen.. 1 by mouth once daily or two times a day  as directed  Other Orders: Admin 1st Vaccine ([address]) Flu Vaccine 49yrs + ([address])  Patient Instructions: 1)  Take prilosec every day  for now.  and could take two times a day also .  2)  No other pills such as vitamins for now. 3)  Will notify you about Gi appt.   I think you will  need an endoscopy.    Orders Added: 1)  Admin 1st  Vaccine [90471] 2)  Flu Vaccine 35yrs + [46962] 3)  Gastroenterology Referral [GI] 4)  Est. Patient Level IV [[address]]   Flu Vaccine Consent Questions     Do you have a history of severe allergic reactions to this vaccine? no    Any prior history of allergic reactions to egg and/or gelatin? no    Do you have a sensitivity to the preservative Thimersol? no    Do you have a past history of Guillan-Barre Syndrome? no    Do you currently have an acute febrile illness? no    Have you ever had a severe reaction to latex? no    Vaccine information given and explained to patient? yes    Are you currently pregnant? no    Lot Number:[id number]   Exp Date:02/19/2011   Site Given  Left Deltoid IM Romualdo Bolk, CMA (AAMA)  July 12, 2010 10:44 AM          .lbflu

## 2010-10-04 ENCOUNTER — Other Ambulatory Visit: Payer: Self-pay | Admitting: Obstetrics and Gynecology

## 2010-10-04 DIAGNOSIS — Z1231 Encounter for screening mammogram for malignant neoplasm of breast: Secondary | ICD-10-CM

## 2010-10-05 ENCOUNTER — Other Ambulatory Visit: Payer: Self-pay | Admitting: Gastroenterology

## 2010-10-05 ENCOUNTER — Encounter (AMBULATORY_SURGERY_CENTER): Payer: BC Managed Care – PPO | Admitting: Gastroenterology

## 2010-10-05 DIAGNOSIS — K625 Hemorrhage of anus and rectum: Secondary | ICD-10-CM

## 2010-10-05 DIAGNOSIS — K449 Diaphragmatic hernia without obstruction or gangrene: Secondary | ICD-10-CM

## 2010-10-05 DIAGNOSIS — R109 Unspecified abdominal pain: Secondary | ICD-10-CM

## 2010-10-05 DIAGNOSIS — K3189 Other diseases of stomach and duodenum: Secondary | ICD-10-CM

## 2010-10-05 DIAGNOSIS — D126 Benign neoplasm of colon, unspecified: Secondary | ICD-10-CM

## 2010-10-12 ENCOUNTER — Encounter: Payer: Self-pay | Admitting: Gastroenterology

## 2010-10-13 NOTE — Procedures (Addendum)
Summary: Colonoscopy  Patient: Bonnie Ward Note: All result statuses are Final unless otherwise noted.  Tests: (1) Colonoscopy (COL)   COL Colonoscopy           DONE     [facility]     [address]     [place], [place]  [address]           COLONOSCOPY PROCEDURE REPORT           PATIENT:  [name], [name]  MR#:  [id number]     BIRTHDATE:  04-Dec-1960, 49 yrs. old  GENDER:  female     ENDOSCOPIST:  Rachael Fee, MD     REF. BY:  Neta Mends. Panosh, M.D.     PROCEDURE DATE:  10/05/2010     PROCEDURE:  Colonoscopy with snare polypectomy     ASA CLASS:  Class II     INDICATIONS:  IBS like symptoms, mild intermittent rectal bleeding           MEDICATIONS:   Fentanyl 75 mcg IV, Versed 7 mg IV           DESCRIPTION OF PROCEDURE:   After the risks benefits and     alternatives of the procedure were thoroughly explained, informed     consent was obtained.  Digital rectal exam was performed and     revealed no rectal masses.   The LB [id number] endoscope     was introduced through the anus and advanced to the cecum, which     was identified by both the appendix and ileocecal valve, without     limitations.  The quality of the prep was good, using MoviPrep.     The instrument was then slowly withdrawn as the colon was fully     examined.     <<PROCEDUREIMAGES>>     FINDINGS: Three sessile polyps were found, all were removed with     cold snare and all were sent to pathology (jar 1). These were     located in cecum and ascending colon, were 5-30mm across (see     image2).  This was otherwise a normal examination of the colon     (see image3, image1, and image5).   Retroflexed views in the     rectum revealed no abnormalities.    The scope was then withdrawn     from the patient and the procedure completed.     COMPLICATIONS:  None           ENDOSCOPIC IMPRESSION:     1) Three polyps, all removed and all sent to pathology     2) Otherwise normal examination          RECOMMENDATIONS:     1) If the polyps removed today are proven to be adenomatous     (pre-cancerous) polyps, you will need a colonoscopy in 3 years.     Otherwise you should continue to follow colorectal cancer     screening guidelines for "routine risk" patients with a     colonoscopy in 10 years.     2) You will receive a letter within 1-2 weeks with the results     of your biopsy as well as final recommendations. Please call my     office if you have not received a letter after 3 weeks.           ______________________________     Rachael Fee, MD  n.     eSIGNED:   Rachael Fee at 10/05/2010 11:07 AM           Arthur Holms, [id number]  Note: An exclamation mark (!) indicates a result that was not dispersed into the flowsheet. Document Creation Date: 10/05/2010 11:23 AM _______________________________________________________________________  (1) Order result status: Final Collection or observation date-time: 10/05/2010 11:00 Requested date-time:  Receipt date-time:  Reported date-time:  Referring Physician:   Ordering Physician: Rob Bunting [id number]) Specimen Source:  Source: Launa Grill Order Number: [id number] Lab site:   Appended Document: Colonoscopy     Procedures Next Due Date:    Colonoscopy: 09/2013

## 2010-10-13 NOTE — Procedures (Signed)
Summary: Upper Endoscopy  Patient: [name] Note: All result statuses are Final unless otherwise noted.  Tests: (1) Upper Endoscopy (EGD)   EGD Upper Endoscopy       DONE     [facility]     [address]     [place], [place]  [address]           ENDOSCOPY PROCEDURE REPORT           PATIENT:  Bonnie Ward, Bonnie Ward  MR#:  [id number]     BIRTHDATE:  [birth year], 49 yrs. old  GENDER:  female     ENDOSCOPIST:  Rachael Fee, MD     [facility] DATE:  10/05/2010     PROCEDURE:  EGD, diagnostic 19147     ASA CLASS:  Class II     INDICATIONS:  dyspepsia     MEDICATIONS:  There was residual sedation effect present from     prior procedure., Versed 1 mg IV     TOPICAL ANESTHETIC:  Exactacain Spray           DESCRIPTION OF PROCEDURE:   After the risks benefits and     alternatives of the procedure were thoroughly explained, informed     consent was obtained.  The LB [id number] endoscope was     introduced through the mouth and advanced to the second portion of     the duodenum, without limitations.  The instrument was slowly     withdrawn as the mucosa was fully examined.     <<PROCEDUREIMAGES>>     A 2cm hiatal hernia was found (see image5).  Otherwise the     examination was normal (see image3, image4, image6, image1, and     image2).    Retroflexed views revealed no abnormalities.    The     scope was then withdrawn from the patient and the procedure     completed.     COMPLICATIONS:  None           ENDOSCOPIC IMPRESSION:     1) Small hiatal hernia     2) Otherwise normal examination           RECOMMENDATIONS:     Continue to take "as needed" anti-acid meds.           ______________________________     Rachael Fee, MD           cc: Berniece Andreas, MD           n.     Rosalie Doctor:   Rachael Fee at 10/05/2010 11:10 AM           Arthur Holms, [phone]  Note: An exclamation mark (!) indicates a result that was not dispersed into the flowsheet. Document  Creation Date: 10/05/2010 11:23 AM _______________________________________________________________________  (1) Order result status: Final Collection or observation date-time: 10/05/2010 11:06 Requested date-time:  Receipt date-time:  Reported date-time:  Referring Physician:   Ordering Physician: Rob Bunting [phone]) Specimen Source:  Source: Launa Grill Order Number: [id number] Lab site:

## 2010-10-18 ENCOUNTER — Ambulatory Visit (INDEPENDENT_AMBULATORY_CARE_PROVIDER_SITE_OTHER): Payer: BC Managed Care – PPO | Admitting: Internal Medicine

## 2010-10-18 ENCOUNTER — Encounter: Payer: Self-pay | Admitting: Internal Medicine

## 2010-10-18 VITALS — BP 120/80 | HR 88 | Temp 98.8°F | Wt 147.0 lb

## 2010-10-18 DIAGNOSIS — J069 Acute upper respiratory infection, unspecified: Secondary | ICD-10-CM

## 2010-10-18 DIAGNOSIS — Z79890 Hormone replacement therapy (postmenopausal): Secondary | ICD-10-CM | POA: Insufficient documentation

## 2010-10-18 DIAGNOSIS — G930 Cerebral cysts: Secondary | ICD-10-CM | POA: Insufficient documentation

## 2010-10-18 NOTE — Progress Notes (Signed)
  Subjective:    Patient ID: Bonnie Ward, female    DOB: 1961-01-21, 50 y.o.   MRN: [id number]  HPI Patient comes in today for acute illness onset 3 days ago with st  Sneezing. and malaise and now congestion coughing and head pressure now has yellowed nasal Dc and coughing but no sob wheezing cp.  Using nyquil hx and took sudafed today.  Husband smokes but minimal ets.  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 No hx of asthma or lung disease abut has poss all rhinitis    Review of Systems Neg cp sob fever  As per hpi  Also  bleeding on the combipatch      Objective:   Physical Exam WD WN in nad looks tired  Non toxic and congested.  HEENT: Normocephalic ;atraumatic , Eyes;  PERRL, EOMs  Full, lids and conjunctiva clear,,Ears: no deformities, canals nl, TM landmarks normal, Nose: congested  Mucoid dc  Face mildy tender face maxilla and frontal area   Mouth : OP clear without lesion or edema .slight erythema  Neck  No mass or adenopathy. Chest:  Clear to A&P without wheezes rales or rhonchi CV:  S1-S2 no gallops or murmurs peripheral perfusion is normal.           Assessment & Plan:  Acute URI with sinus involvement     Expectant management.   Call with alarm features  And if  persistent or progressive consider adding antibioitc as appropriate

## 2010-10-18 NOTE — Patient Instructions (Addendum)
This is a viral URI  With sinus involvement.  This should resolve in 7-10 days . However the body aches should be gone in 2-3 days . Take decongestant and saline nose spray for  Moisturizing.     Nose spray  Would help prevent  nose bleeds.    Cough may get worse before gets better.  If getting increasing sinus pain despite above or continuing without improvement . Call for advise . Sometime antibiotic treatment will help at that point.

## 2010-10-19 NOTE — Letter (Signed)
Summary: Results Letter  [facility]  [address] [place], [place] [address]   Phone: [phone]  Fax: [phone]        October 12, 2010 MRN: [id number]    [facility] [address] [place], [place]  [address]    Dear Ms. Shaheen,   The polyps removed during your recent procedure were proven to be adenomatous.  These are pre-cancerous polyps that may have grown into cancers if they had not been removed.  Based on current nationally recognized surveillance guidelines, I recommend that you have a repeat colonoscopy in 3 years.   We will therefore put your information in our reminder system and will contact you in 3 years to schedule a repeat procedure.  Please call if you have any questions or concerns.       Sincerely,  Rachael Fee MD  This letter has been electronically signed by your physician.  Appended Document: Results Letter letter mailed

## 2010-11-12 ENCOUNTER — Ambulatory Visit (HOSPITAL_COMMUNITY)
Admission: RE | Admit: 2010-11-12 | Discharge: 2010-11-12 | Disposition: A | Discharge status: Home / Self Care | Payer: BC Managed Care – PPO | Source: Ambulatory Visit | Attending: Obstetrics and Gynecology | Admitting: Obstetrics and Gynecology

## 2010-11-12 DIAGNOSIS — Z1231 Encounter for screening mammogram for malignant neoplasm of breast: Secondary | ICD-10-CM | POA: Insufficient documentation

## 2011-01-28 ENCOUNTER — Other Ambulatory Visit: Payer: Self-pay | Admitting: Neurological Surgery

## 2011-01-28 DIAGNOSIS — G930 Cerebral cysts: Secondary | ICD-10-CM

## 2011-02-04 ENCOUNTER — Ambulatory Visit
Admission: RE | Admit: 2011-02-04 | Discharge: 2011-02-04 | Disposition: A | Discharge status: Home / Self Care | Payer: BC Managed Care – PPO | Source: Ambulatory Visit | Attending: Neurological Surgery | Admitting: Neurological Surgery

## 2011-02-04 DIAGNOSIS — G930 Cerebral cysts: Secondary | ICD-10-CM

## 2011-10-17 ENCOUNTER — Other Ambulatory Visit: Payer: Self-pay | Admitting: Nurse Practitioner

## 2011-10-17 DIAGNOSIS — Z1231 Encounter for screening mammogram for malignant neoplasm of breast: Secondary | ICD-10-CM

## 2011-11-14 ENCOUNTER — Ambulatory Visit (HOSPITAL_COMMUNITY)
Admission: RE | Admit: 2011-11-14 | Discharge: 2011-11-14 | Disposition: A | Discharge status: Home / Self Care | Payer: BC Managed Care – PPO | Source: Ambulatory Visit | Attending: Nurse Practitioner | Admitting: Nurse Practitioner

## 2011-11-14 DIAGNOSIS — Z1231 Encounter for screening mammogram for malignant neoplasm of breast: Secondary | ICD-10-CM | POA: Insufficient documentation

## 2012-11-05 ENCOUNTER — Other Ambulatory Visit: Payer: Self-pay | Admitting: Obstetrics and Gynecology

## 2012-11-05 DIAGNOSIS — Z1231 Encounter for screening mammogram for malignant neoplasm of breast: Secondary | ICD-10-CM

## 2012-11-19 ENCOUNTER — Ambulatory Visit (HOSPITAL_COMMUNITY)
Admission: RE | Admit: 2012-11-19 | Discharge: 2012-11-19 | Disposition: A | Discharge status: Home / Self Care | Payer: BC Managed Care – PPO | Source: Ambulatory Visit | Attending: Obstetrics and Gynecology | Admitting: Obstetrics and Gynecology

## 2012-11-19 DIAGNOSIS — Z1231 Encounter for screening mammogram for malignant neoplasm of breast: Secondary | ICD-10-CM | POA: Insufficient documentation

## 2012-12-07 ENCOUNTER — Other Ambulatory Visit: Payer: Self-pay | Admitting: Dermatology

## 2013-01-31 ENCOUNTER — Encounter: Payer: Self-pay | Admitting: *Deleted

## 2013-02-18 ENCOUNTER — Ambulatory Visit: Payer: Self-pay | Admitting: Nurse Practitioner

## 2013-03-20 ENCOUNTER — Other Ambulatory Visit: Payer: Self-pay | Admitting: Nurse Practitioner

## 2013-03-20 NOTE — Telephone Encounter (Signed)
aex is 8/11/4. Pt was last given imitrex 03/12/12 #9 with 12 refills. rx approved today for #9 with no refills

## 2013-03-22 ENCOUNTER — Ambulatory Visit: Payer: Self-pay | Admitting: Nurse Practitioner

## 2013-04-01 ENCOUNTER — Encounter: Payer: Self-pay | Admitting: Nurse Practitioner

## 2013-04-01 ENCOUNTER — Ambulatory Visit (INDEPENDENT_AMBULATORY_CARE_PROVIDER_SITE_OTHER): Payer: BC Managed Care – PPO | Admitting: Nurse Practitioner

## 2013-04-01 VITALS — BP 98/44 | HR 64 | Resp 12 | Ht 66.0 in | Wt 147.4 lb

## 2013-04-01 DIAGNOSIS — M8580 Other specified disorders of bone density and structure, unspecified site: Secondary | ICD-10-CM

## 2013-04-01 DIAGNOSIS — M899 Disorder of bone, unspecified: Secondary | ICD-10-CM

## 2013-04-01 DIAGNOSIS — Z0000 Encounter for general adult medical examination without abnormal findings: Secondary | ICD-10-CM

## 2013-04-01 DIAGNOSIS — Z01419 Encounter for gynecological examination (general) (routine) without abnormal findings: Secondary | ICD-10-CM

## 2013-04-01 LAB — POCT URINALYSIS DIPSTICK
Leukocytes, UA: NEGATIVE
pH, UA: 6

## 2013-04-01 NOTE — Patient Instructions (Signed)

## 2013-04-01 NOTE — Progress Notes (Signed)
52 y.o. G0P0 Legally Separated Caucasian Fe here for annual exam.  Patient and husband are trying to work out their differences so they are still dating.  No other partners for each. No vaso symptoms and has done well on Triest (progesterone 3-150-0.75 mg troche). Bio identical HRT given by Dr. Ananias Pilgrim. No vaginal dryness.  No LMP recorded. Patient is postmenopausal.          Sexually active: no  The current method of family planning is post menopausal status.    Exercising: yes  pilates, spin, strength training and walking.  Smoker:  no  Health Maintenance: Pap:  02/13/2012  Normal with Negative HR HPV  MMG:  11/20/12 normal Colonoscopy:  10/2010 precancer repeat in 3 years BMD:   12/22/2006  T Score: spine -1.9/ left femur total -2.4/ right femur total -2.4  (all labs including PTH and calcium is normal) TDaP:  08/2011 Labs: Hgb- 12.4    reports that she has quit smoking. She has never used smokeless tobacco. She reports that she does not drink alcohol or use illicit drugs.  Past Medical History  Diagnosis Date  . GLAUCOMA, LEFT EYE 01/13/2009  . ALLERGIC RHINITIS 01/13/2009  . ACID REFLUX DISEASE 07/12/2010  . UTI'S, RECURRENT 03/10/2009  . Arachnoid cyst     right frontal  . Hx of varicella   . Metatarsal stress fracture     hx of same   . IBS (irritable bowel syndrome)   . Abnormal Pap smear     CIN-I/HPV  . Arachnoid cyst     left frontal lobe  . Fracture     fractured foot    History reviewed. No pertinent past surgical history.  Current Outpatient Prescriptions  Medication Sig Dispense Refill  . bimatoprost (LUMIGAN) 0.03 % ophthalmic solution 1 drop at bedtime.      . calcium carbonate (OS-CAL) 600 MG TABS Take 600 mg by mouth 2 (two) times daily with a meal.      . cholecalciferol (VITAMIN D) 1000 UNITS tablet Take 1,000 Units by mouth daily.      . fish oil-omega-3 fatty acids 1000 MG capsule Take 2 g by mouth daily.      Marland Kitchen ibuprofen (ADVIL,MOTRIN) 100 MG tablet Take  100 mg by mouth every 6 (six) hours as needed for fever.      . Multiple Vitamin (MULTIVITAMIN) tablet Take 1 tablet by mouth daily.      Marland Kitchen omeprazole (PRILOSEC) 10 MG capsule Take 10 mg by mouth daily.      . SUMAtriptan (IMITREX) 25 MG tablet Take 25 mg by mouth every 2 (two) hours as needed for migraine.       No current facility-administered medications for this visit.    Family History  Problem Relation Age of Onset  . Osteoporosis Mother   . Hypertension Father   . Hyperlipidemia Father   . Diabetes Father   . Dementia Father     ROS:  Pertinent items are noted in HPI.  Otherwise, a comprehensive ROS was negative.  Exam:   BP 98/44  Pulse 64  Resp 12  Ht 5\' 6"  (1.676 m)  Wt 147 lb 6.4 oz (66.86 kg)  BMI 23.8 kg/m2 Height: 5\' 6"  (167.6 cm)  Ht Readings from Last 3 Encounters:  04/01/13 5\' 6"  (1.676 m)  07/21/10 5' 6.5" (1.689 m)  01/04/10 5' 6.5" (1.689 m)    General appearance: alert, cooperative and appears stated age Head: Normocephalic, without obvious abnormality, atraumatic Neck:  no adenopathy, supple, symmetrical, trachea midline and thyroid normal to inspection and palpation Lungs: clear to auscultation bilaterally Breasts: normal appearance, no masses or tenderness Heart: regular rate and rhythm Abdomen: soft, non-tender; no masses,  no organomegaly Extremities: extremities normal, atraumatic, no cyanosis or edema Skin: Skin color, texture, turgor normal. No rashes or lesions Lymph nodes: Cervical, supraclavicular, and axillary nodes normal. No abnormal inguinal nodes palpated Neurologic: Grossly normal   Pelvic: External genitalia:  no lesions              Urethra:  normal appearing urethra with no masses, tenderness or lesions              Bartholin's and Skene's: normal                 Vagina: normal appearing vagina with normal color and discharge, no lesions              Cervix: anteverted              Pap taken: no Bimanual Exam:  Uterus:   normal size, contour, position, consistency, mobility, non-tender              Adnexa: no mass, fullness, tenderness               Rectovaginal: Confirms               Anus:  normal sphincter tone, no lesions  A:  Well Woman with normal exam  Postmenopausal on bio-identical HRT with Dr. Alessandra Bevels (did not do well on conventional HRT)  Osteopenia borderline osteoporosis - conservative treatment  [facility] of osteoporosis both parents  P:   Pap smear as per guidelines   Mammogram due 11/2013  Failed to get BMD done last year order placed with Epic to get done now and will follow. Will continue with calcium and weight bearing exercise.  Counseled on breast self exam, osteoporosis, adequate intake of calcium and vitamin D, diet and exercise return annually or prn  An After Visit Summary was printed and given to the patient.

## 2013-04-03 NOTE — Progress Notes (Signed)
Encounter reviewed by Dr. Brook Silva.  

## 2013-05-13 ENCOUNTER — Other Ambulatory Visit: Payer: Self-pay | Admitting: Nurse Practitioner

## 2013-05-13 NOTE — Telephone Encounter (Signed)
Last AEX was 04/01/13 no rx was given, please advise.

## 2013-05-29 ENCOUNTER — Other Ambulatory Visit: Payer: Self-pay | Admitting: Dermatology

## 2013-08-07 ENCOUNTER — Encounter: Payer: Self-pay | Admitting: Gastroenterology

## 2013-09-02 ENCOUNTER — Encounter: Payer: Self-pay | Admitting: Gastroenterology

## 2013-09-30 ENCOUNTER — Ambulatory Visit (AMBULATORY_SURGERY_CENTER): Payer: Self-pay | Admitting: *Deleted

## 2013-09-30 VITALS — Ht 66.0 in | Wt 152.0 lb

## 2013-09-30 DIAGNOSIS — Z86010 Personal history of colonic polyps: Secondary | ICD-10-CM

## 2013-09-30 MED ORDER — MOVIPREP 100 G PO SOLR: ORAL | Status: DC

## 2013-09-30 NOTE — Progress Notes (Signed)
Patient denies any allergies to eggs or soy. Patient denies any problems with anesthesia. Patient states she has just started taking Nexium for heart burn.

## 2013-10-02 ENCOUNTER — Encounter: Payer: Self-pay | Admitting: Gastroenterology

## 2013-10-14 ENCOUNTER — Encounter: Payer: Self-pay | Admitting: Gastroenterology

## 2013-10-14 ENCOUNTER — Ambulatory Visit (AMBULATORY_SURGERY_CENTER): Payer: BC Managed Care – PPO | Admitting: Gastroenterology

## 2013-10-14 VITALS — BP 119/55 | HR 61 | Temp 99.0°F | Resp 15 | Ht 66.0 in | Wt 152.0 lb

## 2013-10-14 DIAGNOSIS — R933 Abnormal findings on diagnostic imaging of other parts of digestive tract: Secondary | ICD-10-CM

## 2013-10-14 DIAGNOSIS — K5289 Other specified noninfective gastroenteritis and colitis: Secondary | ICD-10-CM

## 2013-10-14 DIAGNOSIS — Z86010 Personal history of colonic polyps: Secondary | ICD-10-CM

## 2013-10-14 HISTORY — PX: COLONOSCOPY WITH PROPOFOL: SHX5780

## 2013-10-14 MED ORDER — SODIUM CHLORIDE 0.9 % IV SOLN: 500.0000 mL | INTRAVENOUS | Status: DC

## 2013-10-14 NOTE — Progress Notes (Signed)
Called to room to assist during endoscopic procedure.  Patient ID and intended procedure confirmed with present staff. Received instructions for my participation in the procedure from the performing physician.  

## 2013-10-14 NOTE — Patient Instructions (Signed)
YOU HAD AN ENDOSCOPIC PROCEDURE TODAY AT THE [facility]: Refer to the procedure report that was given to you for any specific questions about what was found during the examination.  If the procedure report does not answer your questions, please call your gastroenterologist to clarify.  If you requested that your care partner not be given the details of your procedure findings, then the procedure report has been included in a sealed envelope for you to review at your convenience later.  YOU SHOULD EXPECT: Some feelings of bloating in the abdomen. Passage of more gas than usual.  Walking can help get rid of the air that was put into your GI tract during the procedure and reduce the bloating. If you had a lower endoscopy (such as a colonoscopy or flexible sigmoidoscopy) you may notice spotting of blood in your stool or on the toilet paper. If you underwent a bowel prep for your procedure, then you may not have a normal bowel movement for a few days.  DIET: Your first meal following the procedure should be a light meal and then it is ok to progress to your normal diet.  A half-sandwich or bowl of soup is an example of a good first meal.  Heavy or fried foods are harder to digest and may make you feel nauseous or bloated.  Likewise meals heavy in dairy and vegetables can cause extra gas to form and this can also increase the bloating.  Drink plenty of fluids but you should avoid alcoholic beverages for 24 hours.  ACTIVITY: Your care partner should take you home directly after the procedure.  You should plan to take it easy, moving slowly for the rest of the day.  You can resume normal activity the day after the procedure however you should NOT DRIVE or use heavy machinery for 24 hours (because of the sedation medicines used during the test).    SYMPTOMS TO REPORT IMMEDIATELY: A gastroenterologist can be reached at any hour.  During normal business hours, 8:30 AM to 5:00 PM Monday through Friday,  call [phone].  After hours and on weekends, please call the GI answering service at [phone] who will take a message and have the physician on call contact you.   Following lower endoscopy (colonoscopy or flexible sigmoidoscopy):  Excessive amounts of blood in the stool  Significant tenderness or worsening of abdominal pains  Swelling of the abdomen that is new, acute  Fever of 100F or higher  FOLLOW UP: If any biopsies were taken you will be contacted by phone or by letter within the next 1-3 weeks.  Call your gastroenterologist if you have not heard about the biopsies in 3 weeks.  Our staff will call the home number listed on your records the next business day following your procedure to check on you and address any questions or concerns that you may have at that time regarding the information given to you following your procedure. This is a courtesy call and so if there is no answer at the home number and we have not heard from you through the emergency physician on call, we will assume that you have returned to your regular daily activities without incident.  SIGNATURES/CONFIDENTIALITY: You and/or your care partner have signed paperwork which will be entered into your electronic medical record.  These signatures attest to the fact that that the information above on your After Visit Summary has been reviewed and is understood.  Full responsibility of the confidentiality of this   discharge information lies with you and/or your care-partner.  Await biopsy results  Please continue your normal medications  Dr. Ardis Hughs' office will get in touch with you in 6 weeks to check on your heartburn symptoms

## 2013-10-14 NOTE — Op Note (Signed)
[facility]  [address] [place], [address]   COLONOSCOPY PROCEDURE REPORT  PATIENT: Bonnie Ward, Bonnie Ward  MR#: [id number] BIRTHDATE: [birth year] , 52  yrs. old GENDER: Female ENDOSCOPIST: Milus Banister, MD PROCEDURE DATE:  10/14/2013 PROCEDURE:   Colonoscopy with hot biopsy/bipolar First Screening Colonoscopy - Avg.  risk and is 50 yrs.  old or older - No.  Prior Negative Screening - Now for repeat screening. N/A  History of Adenoma - Now for follow-up colonoscopy & has been > or = to 3 yrs.  Yes hx of adenoma.  Has been 3 or more years since last colonoscopy.  Polyps Removed Today? No.  Recommend repeat exam, <10 yrs? Yes.  High risk (family or personal hx). ASA CLASS:   Class II INDICATIONS:Three pre-cancerous ("serrated adenomas" in right colon) removed 2012 [facility]. MEDICATIONS: MAC sedation, administered by CRNA and propofol (Diprivan) 200mg  IV  DESCRIPTION OF PROCEDURE:   After the risks benefits and alternatives of the procedure were thoroughly explained, informed consent was obtained.  A digital rectal exam revealed no abnormalities of the rectum.   The LB [id number]  endoscope was introduced through the anus and advanced to the cecum, which was identified by both the appendix and ileocecal valve. No adverse events experienced.   The quality of the prep was good.  The instrument was then slowly withdrawn as the colon was fully examined.   COLON FINDINGS: There was an area of apparent focal inflammation (about 1cm across); granular mucosa with some puncate erosions; located in cecum.  This did not appear neoplastic but was biopsied to be certain.  The examination was otherwise normal.  Retroflexed views revealed no abnormalities. The time to cecum=3 minutes 49 seconds.  Withdrawal time=9 minutes 37 seconds.  The scope was withdrawn and the procedure completed. COMPLICATIONS: There were no complications.  ENDOSCOPIC IMPRESSION: There was an area  of apparent focal inflammation (about 1cm across); granular mucosa with some puncate erosions; located in cecum.  This did not appear neoplastic but was biopsied to be certain.  The examination was otherwise normal  RECOMMENDATIONS: Given your previous polyps in 2012, you will likely need repeat colonoscopy in 5 years.  Await final pathology results from today's examination. Dr. Ardis Hughs office will get in touch with you about office visit in 6 weeks to discuss your worsening heartburn; please stay on once daily nexium (or prilosec, prevacid, etc) until then.   eSigned:  Milus Banister, MD 10/14/2013 9:05 AM   cc: Shanon Ace

## 2013-10-14 NOTE — Progress Notes (Signed)
A/ox3 pleased with MAC, report to Kristin RN 

## 2013-10-15 ENCOUNTER — Telehealth: Payer: Self-pay | Admitting: *Deleted

## 2013-10-15 NOTE — Telephone Encounter (Signed)
Ok, thanks.

## 2013-10-15 NOTE — Telephone Encounter (Signed)
  Follow up Call-  Call back number 10/14/2013  Post procedure Call Back phone  # [phone]  Permission to leave phone message Yes     Patient questions:  Do you have a fever, pain , or abdominal swelling? no Pain Score  0 *  Have you tolerated food without any problems? yes  Have you been able to return to your normal activities? yes  Do you have any questions about your discharge instructions: Diet   no Medications  no Follow up visit  no  Do you have questions or concerns about your Care? yes  Actions: * If pain score is 4 or above: No action needed, pain <4. At the end of the conversation patient stated she wanted [place] to know a concern. Patient stating she was awake during the procedure. Patient stating she heard conversation and felt the scope. Patient denied pain during the procedure. Patient stating she is a "light weight" and usually does not require much sedation but wants more sedation at next procedure. Discussed with patient that she most likely was not awake for entire procedure. Apologized to patient and informed that I would pass on this information.

## 2013-10-18 ENCOUNTER — Encounter: Payer: Self-pay | Admitting: Gastroenterology

## 2013-10-28 ENCOUNTER — Other Ambulatory Visit: Payer: Self-pay | Admitting: Nurse Practitioner

## 2013-10-28 DIAGNOSIS — Z1231 Encounter for screening mammogram for malignant neoplasm of breast: Secondary | ICD-10-CM

## 2013-12-02 ENCOUNTER — Ambulatory Visit (HOSPITAL_COMMUNITY)
Admission: RE | Admit: 2013-12-02 | Discharge: 2013-12-02 | Disposition: A | Discharge status: Home / Self Care | Payer: BC Managed Care – PPO | Source: Ambulatory Visit | Attending: Nurse Practitioner | Admitting: Nurse Practitioner

## 2013-12-02 DIAGNOSIS — M949 Disorder of cartilage, unspecified: Secondary | ICD-10-CM

## 2013-12-02 DIAGNOSIS — M899 Disorder of bone, unspecified: Secondary | ICD-10-CM | POA: Insufficient documentation

## 2013-12-02 DIAGNOSIS — Z13820 Encounter for screening for osteoporosis: Secondary | ICD-10-CM | POA: Insufficient documentation

## 2013-12-02 DIAGNOSIS — Z1231 Encounter for screening mammogram for malignant neoplasm of breast: Secondary | ICD-10-CM | POA: Insufficient documentation

## 2013-12-02 DIAGNOSIS — M8580 Other specified disorders of bone density and structure, unspecified site: Secondary | ICD-10-CM

## 2013-12-03 ENCOUNTER — Ambulatory Visit: Payer: BC Managed Care – PPO | Admitting: Gastroenterology

## 2013-12-05 ENCOUNTER — Telehealth: Payer: Self-pay | Admitting: *Deleted

## 2013-12-05 NOTE — Telephone Encounter (Signed)
Message copied by Graylon Good on Thu Dec 05, 2013 11:36 AM ------      Message from: Kem Boroughs R      Created: Wed Dec 04, 2013  1:27 PM       Let patient know that BM compared to 2008 showed an increase at the spine from -1.9 to -1.8 which is a 1.0% increase.  Then the hip went from -2.4 to -2.2 which is a 4.1 % increase.  Overall you are very stable on calcium, Vit D and exercise routine.  Keep up the good work! ------

## 2013-12-05 NOTE — Telephone Encounter (Signed)
Pt notified in result note.  

## 2013-12-05 NOTE — Telephone Encounter (Signed)
Pt returning call. Says it's ok to leave a detailed message because she will not be able to answer.

## 2013-12-05 NOTE — Telephone Encounter (Signed)
I have attempted to contact this patient by phone with the following results: left message to return my call on answering machine (home/mobile per DPR).  

## 2013-12-09 ENCOUNTER — Encounter: Payer: Self-pay | Admitting: Gastroenterology

## 2013-12-09 ENCOUNTER — Ambulatory Visit (INDEPENDENT_AMBULATORY_CARE_PROVIDER_SITE_OTHER): Payer: BC Managed Care – PPO | Admitting: Gastroenterology

## 2013-12-09 VITALS — BP 110/80 | HR 64 | Ht 66.5 in | Wt 146.0 lb

## 2013-12-09 DIAGNOSIS — K219 Gastro-esophageal reflux disease without esophagitis: Secondary | ICD-10-CM

## 2013-12-09 NOTE — Progress Notes (Signed)
Review of pertinent gastrointestinal problems: 1. Precancerous polyps in colon: colonoscopy Three pre-cancerous ("serrated adenomas" in right colon) removed 2012 Bonnie Ward.  Repeat colonoscopy Bonnie Ward 09/2013 found no further polyps.  Recall recommended 5 years. 2. GERD related dyspepsia 09/2010 EGD Bonnie Ward 2cm HH otherwise normal exam.   HPI: This is a    very pleasant 53 year old woman whom I last saw the time of   colonoscopy about a month or 2 ago.  Pyrosis intermittently. She takes PPI  And these work well but cause constipation.    Has gerd about 2 times per month.  2 cups caffine daily, no etoh.  TUms works well.  She has never tried H2 blockers    Review of systems: Pertinent positive and negative review of systems were noted in the above HPI section. Complete review of systems was performed and was otherwise normal.    Past Medical History  Diagnosis Date  . GLAUCOMA, LEFT EYE 01/13/2009  . ALLERGIC RHINITIS 01/13/2009  . ACID REFLUX DISEASE 07/12/2010  . UTI'S, RECURRENT 03/10/2009  . Arachnoid cyst     right frontal  . Hx of varicella   . Metatarsal stress fracture 2012    hx of same   . IBS (irritable bowel syndrome)   . Abnormal Pap smear 05/2000    CIN-I/HPV  . Arachnoid cyst     left frontal lobe  . Fracture 2004    fractured foot    Past Surgical History  Procedure Laterality Date  . Tonsillectomy  age 50  . Colonoscopy w/ biopsies  10/2010    pre cancer polyp, recheck in 3 years  . Esophagogastroduodenoscopy endoscopy  10/2010    hiatal hernia  . Tooth extraction      Current Outpatient Prescriptions  Medication Sig Dispense Refill  . cetirizine (ZYRTEC) 10 MG tablet Take 10 mg by mouth daily as needed for allergies. seasonal      . cholecalciferol (VITAMIN D) 1000 UNITS tablet Take 1,000 Units by mouth daily.      . Esomeprazole Magnesium (NEXIUM PO) Take 1 tablet by mouth daily.      . fish oil-omega-3 fatty acids 1000 MG capsule Take 2 g by mouth  daily.      Marland Kitchen Hormone Cream Base CREA 1 application by Other route daily.      Marland Kitchen ibuprofen (ADVIL,MOTRIN) 100 MG tablet Take 100 mg by mouth as needed.       . latanoprost (XALATAN) 0.005 % ophthalmic solution Place 1 drop into both eyes at bedtime.      . Multiple Vitamin (MULTIVITAMIN) tablet Take 1 tablet by mouth daily.      . Probiotic Product (PROBIOTIC DAILY PO) Take by mouth.      . SUMAtriptan (IMITREX) 50 MG tablet TAKE 1 TABLET BY MOUTH AT THE ONSET OF HEADACHE. TAKE NO MORE THAN 4 TABLETS IN 24 HOURS  9 tablet  5   No current facility-administered medications for this visit.    Allergies as of 12/09/2013  . (No Known Allergies)    Family History  Problem Relation Age of Onset  . Osteoporosis Mother   . Irritable bowel syndrome Mother   . Hypertension Mother   . Hypertension Father   . Hyperlipidemia Father   . Diabetes Father   . Dementia Father   . Colon cancer Maternal Grandfather 51    History   Social History  . Marital Status: Legally Separated    Spouse Name: N/A    Number of Children:  N/A  . Years of Education: N/A   Occupational History  . Not on file.   Social History Main Topics  . Smoking status: Former Smoker -- 0.50 packs/day for 10 years    Types: Cigarettes    Quit date: 02/19/2005  . Smokeless tobacco: Never Used  . Alcohol Use: Yes     Comment: weekends  . Drug Use: No  . Sexual Activity: No   Other Topics Concern  . Not on file   Social History Narrative   Hairstylist    Hh of 2    Former smoker   Husband smokes outside       Physical Exam: BP 110/80  Pulse 64  Ht 5' 6.5" (1.689 m)  Wt 146 lb (66.225 kg)  BMI 23.21 kg/m2 Constitutional: generally well-appearing Psychiatric: alert and oriented x3   Assessment and plan: 53 y.o. female with  personal history of precancerous colon polyps, GERD, known hiatal hernia  She really has only very mild GERD symptoms intermittently 2-3 times a month. I recommended she try H2  blockers since proton pump inhibitors seem to cause any unusual side effect of constipation for her. She has never tried H2 blockers but we'll try them and will call if she has any further questions or concerns.

## 2013-12-09 NOTE — Patient Instructions (Signed)
Chocolate can contribute to heartburn. Substitute pepcid or zantac for your prilosec type meds for occasional heartburn. Return as needed.

## 2014-04-07 ENCOUNTER — Encounter: Payer: Self-pay | Admitting: Nurse Practitioner

## 2014-04-07 ENCOUNTER — Ambulatory Visit (INDEPENDENT_AMBULATORY_CARE_PROVIDER_SITE_OTHER): Payer: BC Managed Care – PPO | Admitting: Nurse Practitioner

## 2014-04-07 VITALS — BP 130/78 | HR 72 | Ht 66.5 in | Wt 148.0 lb

## 2014-04-07 DIAGNOSIS — Z0000 Encounter for general adult medical examination without abnormal findings: Secondary | ICD-10-CM

## 2014-04-07 DIAGNOSIS — Z01419 Encounter for gynecological examination (general) (routine) without abnormal findings: Secondary | ICD-10-CM

## 2014-04-07 LAB — POCT URINALYSIS DIPSTICK
BILIRUBIN UA: NEGATIVE
Glucose, UA: NEGATIVE
KETONES UA: NEGATIVE
Leukocytes, UA: NEGATIVE
NITRITE UA: NEGATIVE
PH UA: 5
PROTEIN UA: NEGATIVE
RBC UA: NEGATIVE
Urobilinogen, UA: NEGATIVE

## 2014-04-07 MED ORDER — SUMATRIPTAN SUCCINATE 50 MG PO TABS: ORAL_TABLET | ORAL | Status: DC

## 2014-04-07 NOTE — Patient Instructions (Signed)

## 2014-04-07 NOTE — Progress Notes (Signed)
53 y.o. G0P0 Legally Separated Caucasian Fe here for annual exam.  Still on Bio identical HRT by Dr. Sharol Roussel. No vaso symptoms, no vaginal symptoms.  Patient's last menstrual period was 01/25/2011.          Sexually active: No.  The current method of family planning is post menopausal status.    Exercising: Yes.    Home exercise routine includes pilates, spin class, weightlifting, walking and yoga. Smoker:  no  Health Maintenance: Pap:  02/13/2012 normal with neg HR HPV MMG:  12/03/13 Bi-Rads neg Colonoscopy:  10/14/2013 wnl biopsy, repeat in 5 yeas BMD:   12/02/2013 Spine -1.8, Hip -2.2  TDaP:  08/23/2011 Labs: PCP      UA: neg ph; 5.0   reports that she quit smoking about 9 years ago. Her smoking use included Cigarettes. She has a 5 pack-year smoking history. She has never used smokeless tobacco. She reports that she drinks alcohol. She reports that she does not use illicit drugs.  Past Medical History  Diagnosis Date  . GLAUCOMA, LEFT EYE 01/13/2009  . ALLERGIC RHINITIS 01/13/2009  . ACID REFLUX DISEASE 07/12/2010  . UTI'S, RECURRENT 03/10/2009  . Arachnoid cyst     right frontal  . Hx of varicella   . Metatarsal stress fracture 2012    hx of same   . IBS (irritable bowel syndrome)   . Abnormal Pap smear 05/2000    CIN-I/HPV  . Arachnoid cyst     left frontal lobe  . Fracture 2004    fractured foot    Past Surgical History  Procedure Laterality Date  . Tonsillectomy  age 56  . Colonoscopy w/ biopsies  10/2010    pre cancer polyp, recheck in 3 years  . Esophagogastroduodenoscopy endoscopy  10/2010    hiatal hernia  . Tooth extraction      Current Outpatient Prescriptions  Medication Sig Dispense Refill  . cetirizine (ZYRTEC) 10 MG tablet Take 10 mg by mouth daily as needed for allergies. seasonal      . cholecalciferol (VITAMIN D) 1000 UNITS tablet Take 1,000 Units by mouth daily.      . Esomeprazole Magnesium (NEXIUM PO) Take 1 tablet by mouth daily.      . fish oil-omega-3  fatty acids 1000 MG capsule Take 2 g by mouth daily.      Marland Kitchen ibuprofen (ADVIL,MOTRIN) 100 MG tablet Take 100 mg by mouth as needed.       . latanoprost (XALATAN) 0.005 % ophthalmic solution Place 1 drop into both eyes at bedtime.      Marland Kitchen liothyronine (CYTOMEL) 5 MCG tablet Take 5 mcg by mouth 2 (two) times daily.      . Multiple Vitamin (MULTIVITAMIN) tablet Take 1 tablet by mouth daily.      . Probiotic Product (PROBIOTIC DAILY PO) Take by mouth.      . SUMAtriptan (IMITREX) 50 MG tablet TAKE 1 TABLET BY MOUTH AT THE ONSET OF HEADACHE. TAKE NO MORE THAN 4 TABLETS IN 24 HOURS  9 tablet  5  . Hormone Cream Base CREA 1 application by Other route daily.       No current facility-administered medications for this visit.    Family History  Problem Relation Age of Onset  . Osteoporosis Mother   . Irritable bowel syndrome Mother   . Hypertension Mother   . Hypertension Father   . Hyperlipidemia Father   . Diabetes Father   . Dementia Father   . Colon cancer Maternal  Grandfather 70    ROS:  Pertinent items are noted in HPI.  Otherwise, a comprehensive ROS was negative.  Exam:   BP 130/78  Pulse 72  Ht 5' 6.5" (1.689 m)  Wt 148 lb (67.132 kg)  BMI 23.53 kg/m2  LMP 01/25/2011 Height: 5' 6.5" (168.9 cm)  Ht Readings from Last 3 Encounters:  04/07/14 5' 6.5" (1.689 m)  12/09/13 5' 6.5" (1.689 m)  10/14/13 5\' 6"  (1.676 m)    General appearance: alert, cooperative and appears stated age Head: Normocephalic, without obvious abnormality, atraumatic Neck: no adenopathy, supple, symmetrical, trachea midline and thyroid normal to inspection and palpation Lungs: clear to auscultation bilaterally Breasts: normal appearance, no masses or tenderness Heart: regular rate and rhythm Abdomen: soft, non-tender; no masses,  no organomegaly Extremities: extremities normal, atraumatic, no cyanosis or edema Skin: Skin color, texture, turgor normal. No rashes or lesions Lymph nodes: Cervical,  supraclavicular, and axillary nodes normal. No abnormal inguinal nodes palpated Neurologic: Grossly normal   Pelvic: External genitalia:  no lesions              Urethra:  normal appearing urethra with no masses, tenderness or lesions              Bartholin's and Skene's: normal                 Vagina: normal appearing vagina with normal color and discharge, no lesions              Cervix: anteverted              Pap taken: No. Bimanual Exam:  Uterus:  normal size, contour, position, consistency, mobility, non-tender              Adnexa: no mass, fullness, tenderness               Rectovaginal: Confirms               Anus:  normal sphincter tone, no lesions  A:  Well Woman with normal exam  Postmenopausal on HRT  Hypothyroid on replacement  History of migraine HA's  P:   Reviewed health and wellness pertinent to exam  Pap smear not taken today  Mammogram is due 4/16  Refill on Imitrex prn headaches  Counseled on breast self exam, mammography screening, adequate intake of calcium and vitamin D, diet and exercise return annually or prn  An After Visit Summary was printed and given to the patient.

## 2014-04-13 NOTE — Progress Notes (Signed)
Encounter reviewed by Dr. Brook Silva.  

## 2014-08-22 DIAGNOSIS — Z923 Personal history of irradiation: Secondary | ICD-10-CM

## 2014-08-22 DIAGNOSIS — Z9221 Personal history of antineoplastic chemotherapy: Secondary | ICD-10-CM

## 2014-08-22 HISTORY — DX: Personal history of antineoplastic chemotherapy: Z92.21

## 2014-08-22 HISTORY — DX: Personal history of irradiation: Z92.3

## 2014-08-27 ENCOUNTER — Other Ambulatory Visit: Payer: Self-pay | Admitting: Nurse Practitioner

## 2014-08-27 NOTE — Telephone Encounter (Signed)
Medication refill request: Imitrex Last AEX:  04/07/14 Next AEX: 04/13/15 Last MMG (if hormonal medication request): 12/03/13 BIRADS1:Neg Refill authorized: 04/07/14 #9tabs/ 5R. Today #9 tabs/5R?

## 2014-11-26 ENCOUNTER — Other Ambulatory Visit: Payer: Self-pay | Admitting: Nurse Practitioner

## 2014-11-26 DIAGNOSIS — Z1231 Encounter for screening mammogram for malignant neoplasm of breast: Secondary | ICD-10-CM

## 2014-12-15 ENCOUNTER — Ambulatory Visit (HOSPITAL_COMMUNITY)
Admission: RE | Admit: 2014-12-15 | Discharge: 2014-12-15 | Disposition: A | Discharge status: Home / Self Care | Payer: BLUE CROSS/BLUE SHIELD | Source: Ambulatory Visit | Attending: Nurse Practitioner | Admitting: Nurse Practitioner

## 2014-12-15 DIAGNOSIS — Z1231 Encounter for screening mammogram for malignant neoplasm of breast: Secondary | ICD-10-CM | POA: Insufficient documentation

## 2014-12-16 ENCOUNTER — Other Ambulatory Visit: Payer: Self-pay | Admitting: Nurse Practitioner

## 2014-12-16 DIAGNOSIS — R928 Other abnormal and inconclusive findings on diagnostic imaging of breast: Secondary | ICD-10-CM

## 2014-12-21 DIAGNOSIS — C50919 Malignant neoplasm of unspecified site of unspecified female breast: Secondary | ICD-10-CM

## 2014-12-21 DIAGNOSIS — M84376A Stress fracture, unspecified foot, initial encounter for fracture: Secondary | ICD-10-CM

## 2014-12-21 HISTORY — DX: Stress fracture, unspecified foot, initial encounter for fracture: M84.376A

## 2014-12-21 HISTORY — DX: Malignant neoplasm of unspecified site of unspecified female breast: C50.919

## 2014-12-22 ENCOUNTER — Other Ambulatory Visit: Payer: Self-pay | Admitting: Nurse Practitioner

## 2014-12-22 ENCOUNTER — Ambulatory Visit
Admission: RE | Admit: 2014-12-22 | Discharge: 2014-12-22 | Disposition: A | Discharge status: Home / Self Care | Payer: BLUE CROSS/BLUE SHIELD | Source: Ambulatory Visit | Attending: Nurse Practitioner | Admitting: Nurse Practitioner

## 2014-12-22 DIAGNOSIS — R928 Other abnormal and inconclusive findings on diagnostic imaging of breast: Secondary | ICD-10-CM

## 2014-12-22 DIAGNOSIS — N6310 Unspecified lump in the right breast, unspecified quadrant: Secondary | ICD-10-CM

## 2014-12-25 ENCOUNTER — Other Ambulatory Visit: Payer: Self-pay | Admitting: Nurse Practitioner

## 2014-12-25 DIAGNOSIS — N6310 Unspecified lump in the right breast, unspecified quadrant: Secondary | ICD-10-CM

## 2014-12-26 ENCOUNTER — Ambulatory Visit
Admission: RE | Admit: 2014-12-26 | Discharge: 2014-12-26 | Disposition: A | Discharge status: Home / Self Care | Payer: BLUE CROSS/BLUE SHIELD | Source: Ambulatory Visit | Attending: Nurse Practitioner | Admitting: Nurse Practitioner

## 2014-12-26 DIAGNOSIS — N6310 Unspecified lump in the right breast, unspecified quadrant: Secondary | ICD-10-CM

## 2014-12-31 ENCOUNTER — Telehealth: Payer: Self-pay | Admitting: *Deleted

## 2014-12-31 DIAGNOSIS — Z170 Estrogen receptor positive status [ER+]: Secondary | ICD-10-CM

## 2014-12-31 DIAGNOSIS — C50411 Malignant neoplasm of upper-outer quadrant of right female breast: Secondary | ICD-10-CM | POA: Insufficient documentation

## 2014-12-31 NOTE — Telephone Encounter (Signed)
Received call back from patient.  Confirmed [facility] for 01/07/15 at 8am .  Instructions and contact information given.

## 2014-12-31 NOTE — Telephone Encounter (Signed)
Left message for a return to schedule for [facility] 5/18.  Awaiting patient response.

## 2015-01-07 ENCOUNTER — Ambulatory Visit: Payer: BLUE CROSS/BLUE SHIELD

## 2015-01-07 ENCOUNTER — Ambulatory Visit (HOSPITAL_BASED_OUTPATIENT_CLINIC_OR_DEPARTMENT_OTHER): Payer: BLUE CROSS/BLUE SHIELD | Admitting: Oncology

## 2015-01-07 ENCOUNTER — Other Ambulatory Visit: Payer: Self-pay | Admitting: General Surgery

## 2015-01-07 ENCOUNTER — Encounter: Payer: Self-pay | Admitting: Oncology

## 2015-01-07 ENCOUNTER — Ambulatory Visit: Payer: BLUE CROSS/BLUE SHIELD | Attending: General Surgery | Admitting: Physical Therapy

## 2015-01-07 ENCOUNTER — Ambulatory Visit
Admission: RE | Admit: 2015-01-07 | Discharge: 2015-01-07 | Disposition: A | Discharge status: Home / Self Care | Payer: BLUE CROSS/BLUE SHIELD | Source: Ambulatory Visit | Attending: Radiation Oncology | Admitting: Radiation Oncology

## 2015-01-07 ENCOUNTER — Other Ambulatory Visit (HOSPITAL_BASED_OUTPATIENT_CLINIC_OR_DEPARTMENT_OTHER): Payer: BLUE CROSS/BLUE SHIELD

## 2015-01-07 ENCOUNTER — Encounter: Payer: Self-pay | Admitting: Skilled Nursing Facility1

## 2015-01-07 ENCOUNTER — Encounter: Payer: Self-pay | Admitting: *Deleted

## 2015-01-07 ENCOUNTER — Encounter: Payer: Self-pay | Admitting: Physical Therapy

## 2015-01-07 VITALS — BP 141/82 | HR 80 | Temp 98.1°F | Resp 18 | Ht 65.5 in | Wt 148.6 lb

## 2015-01-07 DIAGNOSIS — C50411 Malignant neoplasm of upper-outer quadrant of right female breast: Secondary | ICD-10-CM

## 2015-01-07 DIAGNOSIS — M25511 Pain in right shoulder: Secondary | ICD-10-CM | POA: Insufficient documentation

## 2015-01-07 DIAGNOSIS — M25611 Stiffness of right shoulder, not elsewhere classified: Secondary | ICD-10-CM

## 2015-01-07 DIAGNOSIS — R293 Abnormal posture: Secondary | ICD-10-CM | POA: Diagnosis not present

## 2015-01-07 LAB — COMPREHENSIVE METABOLIC PANEL (CC13)
ALK PHOS: 60 U/L (ref 40–150)
ALT: 15 U/L (ref 0–55)
AST: 18 U/L (ref 5–34)
Albumin: 3.9 g/dL (ref 3.5–5.0)
Anion Gap: 9 mEq/L (ref 3–11)
BILIRUBIN TOTAL: 0.65 mg/dL (ref 0.20–1.20)
BUN: 15.4 mg/dL (ref 7.0–26.0)
CO2: 26 meq/L (ref 22–29)
Calcium: 8.8 mg/dL (ref 8.4–10.4)
Chloride: 104 mEq/L (ref 98–109)
Creatinine: 0.8 mg/dL (ref 0.6–1.1)
EGFR: 89 mL/min/{1.73_m2} — ABNORMAL LOW (ref >90)
Glucose: 78 mg/dl (ref 70–140)
Potassium: 4.6 mEq/L (ref 3.5–5.1)
SODIUM: 139 meq/L (ref 136–145)
TOTAL PROTEIN: 7 g/dL (ref 6.4–8.3)

## 2015-01-07 LAB — CBC WITH DIFFERENTIAL/PLATELET
BASO%: 0.5 % (ref 0.0–2.0)
Basophils Absolute: 0 10*3/uL (ref 0.0–0.1)
EOS%: 1.5 % (ref 0.0–7.0)
Eosinophils Absolute: 0.1 10*3/uL (ref 0.0–0.5)
HEMATOCRIT: 41.5 % (ref 34.8–46.6)
HEMOGLOBIN: 14 g/dL (ref 11.6–15.9)
LYMPH%: 26.8 % (ref 14.0–49.7)
MCH: 32 pg (ref 25.1–34.0)
MCHC: 33.6 g/dL (ref 31.5–36.0)
MCV: 95.1 fL (ref 79.5–101.0)
MONO#: 0.5 10*3/uL (ref 0.1–0.9)
MONO%: 7.8 % (ref 0.0–14.0)
NEUT%: 63.4 % (ref 38.4–76.8)
NEUTROS ABS: 4.2 10*3/uL (ref 1.5–6.5)
Platelets: 319 10*3/uL (ref 145–400)
RBC: 4.37 10*6/uL (ref 3.70–5.45)
RDW: 14.1 % (ref 11.2–14.5)
WBC: 6.6 10*3/uL (ref 3.9–10.3)
lymph#: 1.8 10*3/uL (ref 0.9–3.3)

## 2015-01-07 NOTE — Progress Notes (Signed)
Subjective:     Patient ID: Bonnie Ward, female   DOB: [birth year], 54 y.o.   MRN: [id number]  HPI   Review of Systems     Objective:   Physical Exam For the patient to understand and be given the tools to implement a healthy plant based diet during their cancer diagnosis.     Assessment:     Patient was seen today and found to be in good spirits and accompanied by her seemingly supportive sister. Pts right breast is afflicted. Pts current/relevant medications liothyronine. Pts ht 5'5.5'' 148 pounds and BMI 24.4. Pt states she conducts plenty of physical activity with 5 days a week (duration was not stated).     Plan:     Dietitian educated the patient on implementing a plant based diet by incorporating more plant proteins, fruits, and vegetables. As a part of a healthy routine physical activity was discussed. The importance of legitimate, evidence based information was discussed and examples were given. A folder of evidence based information with a focus on a plant based diet and general nutrition during cancer was given to the patient.  As a part of the continuum of care the cancer dietitian's contact information was given to the patient in the event they would like to have a follow up appointment.

## 2015-01-07 NOTE — Progress Notes (Signed)
[facility]  [phone] Fax:(336) [phone]     ID: Bonnie Ward DOB: [birth year]  MR#: [id number]  OHY#:[id number]  Patient Care Team: Burnis Medin, MD as PCP - General [place] Cage, [place] Kem Boroughs, FNP as Nurse Practitioner (Nurse Practitioner) Fanny Skates, MD as Consulting Physician (General Surgery) Chauncey Cruel, MD as Consulting Physician (Oncology) Thea Silversmith, MD as Consulting Physician (Radiation Oncology) Rockwell Germany, RN as Registered Nurse Mauro Kaufmann, RN as Registered Nurse Holley Bouche, NP as Nurse Practitioner (Nurse Practitioner) Kristeen Miss, MD as Consulting Physician (Neurosurgery) PCP: Lottie Dawson, MD OTHER MD:  CHIEF COMPLAINT: Estrogen receptor positive breast cancer  CURRENT TREATMENT: Awaiting definitive surgery   BREAST CANCER HISTORY:  Bonnie Ward had routine bilateral screening mammography at the [facility] for [facility] 01/09/2015 showing a possible mass in the right breast. On 12/22/2014 she underwent diagnostic right mammography with tomosynthesis and right breast ultrasonography. The breast density was category C. In the right breast there was an area of increased density with architectural distortion and faint microcalcifications in the upper outer quadrant. There was mild palpable soft tissue thickening in the area in question, but no palpable right axillary lymph nodes. Ultrasound confirmed a 1.7 cm irregular hypoechoic mass. There were no abnormal-appearing right axillary lymph nodes.  Biopsy of the right breast mass in question 12/26/2014 showed (SAA 16-08/08/2005) an invasive ductal carcinoma, grade 1 or 2, with a prognostic panel still pending.  Her subsequent history is as detailed below  INTERVAL HISTORY: Bonnie Ward was evaluated in the multidisciplinary breast cancer clinic 01/07/2015, accompanied by her friend Lodema Pilot. Her case was also presented at the multidisciplinary breast  cancer conference that same morning. At that time a tentative plan was suggested namely breast conservation surgery with sentinel lymph node sampling, followed by Oncotype if the lymph node is negative, then adjuvant radiation followed by anti-estrogens if the tumor is estrogen receptor positive. On the other hand if the tumor is triple negative or HER-2 positive the patient would require chemotherapy.  REVIEW OF SYSTEMS: There were no specific symptoms leading to the original mammogram, which was routinely scheduled. The patient denies unusual headaches, visual changes, nausea, vomiting, stiff neck, dizziness, or gait imbalance. There has been no cough, phlegm production, or pleurisy, no chest pain or pressure, and no change in bowel or bladder habits. The patient denies fever, rash, bleeding, unexplained fatigue or unexplained weight loss. A detailed review of systems was otherwise entirely negative.  PAST MEDICAL HISTORY: Past Medical History  Diagnosis Date  . GLAUCOMA, LEFT EYE 01/13/2009  . ALLERGIC RHINITIS 01/13/2009  . ACID REFLUX DISEASE 07/12/2010  . UTI'S, RECURRENT 03/10/2009  . Arachnoid cyst     right frontal  . Hx of varicella   . Metatarsal stress fracture 2012    hx of same   . IBS (irritable bowel syndrome)   . Abnormal Pap smear 05/2000    CIN-I/HPV  . Arachnoid cyst     left frontal lobe  . Fracture 2004    fractured foot  . Breast cancer   . Arthritis     PAST SURGICAL HISTORY: Past Surgical History  Procedure Laterality Date  . Tonsillectomy  age 84  . Colonoscopy w/ biopsies  10/2010    pre cancer polyp, recheck in 3 years  . Esophagogastroduodenoscopy endoscopy  10/2010    hiatal hernia  . Tooth extraction      FAMILY HISTORY Family History  Problem Relation Age of Onset  .  Osteoporosis Mother   . Irritable bowel syndrome Mother   . Hypertension Mother   . Hypertension Father   . Hyperlipidemia Father   . Diabetes Father   . Dementia Father   .  Colon cancer Maternal Grandfather 23  . Breast cancer Paternal Aunt   . Breast cancer Paternal Grandmother    the patient's father died at age 38 from pneumonia. [name] died at age 68 after bowel perforation. The patient had one brother, one sister. There is no history of breast or ovarian cancer in the family.  GYNECOLOGIC HISTORY:  Patient's last menstrual period was 01/25/2011. Menarche age 17, the patient is GX P0. She stopped having periods approximately 2011. She is still on hormone replacement, but is "trickling off it". She did take birth control pills for approximately 3 years remotely with no complications  SOCIAL HISTORY:  Bonnie Ward works as a Haematologist. She is divorced and lives by herself, with 2 dogs.    ADVANCED DIRECTIVES: The patient's brother [name] is her healthcare power of attorney. He may be reached in [place] at [place]: History  Substance Use Topics  . Smoking status: Former Smoker -- 0.50 packs/day for 10 years    Types: Cigarettes    Quit date: 02/19/2005  . Smokeless tobacco: Never Used  . Alcohol Use: Yes     Comment: weekends     Colonoscopy: February 2016  PAP: June 2015  Bone density: October 2015  Lipid panel:  No Known Allergies  Current Outpatient Prescriptions  Medication Sig Dispense Refill  . Hormone Cream Base CREA 1 application by Other route daily.    Marland Kitchen liothyronine (CYTOMEL) 5 MCG tablet Take 5 mcg by mouth 2 (two) times daily.    . SUMAtriptan (IMITREX) 50 MG tablet TAKE 1 TABLET BY MOUTH AT THE ONSET OF HEADACHE. TAKE NO MORE THAN 4 TABLETS IN 24 HOURS (Patient not taking: Reported on 01/07/2015) 9 tablet 5  . SUMAtriptan (IMITREX) 50 MG tablet TAKE 1 TABLET BY MOUTH AT THE ONSET OF HEADACHE. TAKE NO MORE THAN 4 TABLETS BY MOUTH IN 24 HOURS (Patient not taking: Reported on 01/07/2015) 9 tablet 5   No current facility-administered medications for this visit.    OBJECTIVE: Middle-aged white  woman in no acute distress Filed Vitals:   01/07/15 0845  BP: 141/82  Pulse: 80  Temp: 98.1 F (36.7 C)  Resp: 18     Body mass index is 24.34 kg/(m^2).    ECOG FS:0 - Asymptomatic  Ocular: Sclerae unicteric, pupils round and equal Ear-nose-throat: Oropharynx clear and moist Lymphatic: No cervical or supraclavicular adenopathy Lungs no rales or rhonchi, good excursion bilaterally Heart regular rate and rhythm, no murmur appreciated Abd soft, nontender, positive bowel sounds MSK no focal spinal tenderness, no joint edema Neuro: non-focal, well-oriented, appropriate affect Breasts: The right breast is status post recent biopsy. I do not palpate a well-defined mass. There is no skin or nipple change of concern. The right axilla is benign per the left breast is unremarkable.   LAB RESULTS:  CMP     Component Value Date/Time   NA 139 01/07/2015 0818   NA 140 01/21/2009 0828   K 4.6 01/07/2015 0818   K 4.1 01/21/2009 0828   CL 108 01/21/2009 0828   CO2 26 01/07/2015 0818   CO2 30 01/21/2009 0828   GLUCOSE 78 01/07/2015 0818   GLUCOSE 90 01/21/2009 0828   BUN 15.4 01/07/2015 0818   BUN 13  01/21/2009 0828   CREATININE 0.8 01/07/2015 0818   CREATININE 0.7 01/21/2009 0828   CALCIUM 8.8 01/07/2015 0818   CALCIUM 8.5 01/21/2009 0828   PROT 7.0 01/07/2015 0818   PROT 6.9 01/21/2009 0828   ALBUMIN 3.9 01/07/2015 0818   ALBUMIN 3.7 01/21/2009 0828   AST 18 01/07/2015 0818   AST 22 01/21/2009 0828   ALT 15 01/07/2015 0818   ALT 19 01/21/2009 0828   ALKPHOS 60 01/07/2015 0818   ALKPHOS 43 01/21/2009 0828   BILITOT 0.65 01/07/2015 0818   BILITOT 1.1 01/21/2009 0828   GFRNONAA 95.01 01/21/2009 0828    INo results found for: SPEP, UPEP  Lab Results  Component Value Date   WBC 6.6 01/07/2015   NEUTROABS 4.2 01/07/2015   HGB 14.0 01/07/2015   HCT 41.5 01/07/2015   MCV 95.1 01/07/2015   PLT 319 01/07/2015      Chemistry      Component Value Date/Time   NA 139  01/07/2015 0818   NA 140 01/21/2009 0828   K 4.6 01/07/2015 0818   K 4.1 01/21/2009 0828   CL 108 01/21/2009 0828   CO2 26 01/07/2015 0818   CO2 30 01/21/2009 0828   BUN 15.4 01/07/2015 0818   BUN 13 01/21/2009 0828   CREATININE 0.8 01/07/2015 0818   CREATININE 0.7 01/21/2009 0828      Component Value Date/Time   CALCIUM 8.8 01/07/2015 0818   CALCIUM 8.5 01/21/2009 0828   ALKPHOS 60 01/07/2015 0818   ALKPHOS 43 01/21/2009 0828   AST 18 01/07/2015 0818   AST 22 01/21/2009 0828   ALT 15 01/07/2015 0818   ALT 19 01/21/2009 0828   BILITOT 0.65 01/07/2015 0818   BILITOT 1.1 01/21/2009 0828       No results found for: LABCA2  No components found for: LABCA125  No results for input(s): INR in the last 168 hours.  Urinalysis    Component Value Date/Time   COLORURINE yellow 10/05/2009 1454   APPEARANCEUR Clear 10/05/2009 1454   LABSPEC 1.010 10/05/2009 1454   PHURINE 7.0 10/05/2009 1454   HGBUR 2+ 10/05/2009 1454   BILIRUBINUR negative 04/07/2014 1605   BILIRUBINUR negative 10/05/2009 1454   PROTEINUR negative 04/07/2014 1605   UROBILINOGEN negative 04/07/2014 1605   UROBILINOGEN 0.2 10/05/2009 1454   NITRITE negative 04/07/2014 1605   NITRITE negative 10/05/2009 1454   LEUKOCYTESUR Negative 04/07/2014 1605    STUDIES: Mm Digital Diagnostic Unilat R  12/26/2014   CLINICAL DATA:  Confirmation of clip placement after ultrasound-guided core needle biopsy of a suspicious 1.7 cm mass in the upper outer quadrant of the right breast.  EXAM: DIAGNOSTIC RIGHT MAMMOGRAM POST ULTRASOUND BIOPSY  COMPARISON:  Previous exam(s).  FINDINGS: Mammographic images were obtained following ultrasound guided biopsy of a suspicious 1.7 cm mass in the upper outer quadrant of the right breast at the 10 o'clock position approximately 6 cm from the nipple. The ribbon shaped tissue marker clip is appropriately positioned within the mass in the upper outer quadrant, posterior depth. Expected post biopsy  changes are present without evidence of hematoma.  IMPRESSION: Appropriate placement of the ribbon shaped tissue marker clip within the mass in the upper outer quadrant of the right breast, posterior depth.  Final Assessment: Post Procedure Mammograms for Marker Placement   Electronically Signed   By: Evangeline Dakin M.D.   On: 12/26/2014 16:21   Mm Digital Screening Bilateral  12/16/2014   CLINICAL DATA:  Screening.  EXAM: DIGITAL SCREENING  BILATERAL MAMMOGRAM WITH CAD  COMPARISON:  Previous exam(s).  ACR Breast Density Category c: The breast tissue is heterogeneously dense, which may obscure small masses.  FINDINGS: In the right breast, a possible mass warrants further evaluation. In the left breast, no findings suspicious for malignancy. Images were processed with CAD.  IMPRESSION: Further evaluation is suggested for possible mass in the right breast.  RECOMMENDATION: Diagnostic mammogram and possibly ultrasound of the right breast. (Code:[id number]-[id number])  The patient will be contacted regarding the findings, and additional imaging will be scheduled.  BI-RADS CATEGORY  0: Incomplete. Need additional imaging evaluation and/or prior mammograms for comparison.   Electronically Signed   By: Lajean Manes M.D.   On: 12/16/2014 08:01   US Breast Ltd Uni Right Inc Axilla  12/22/2014   CLINICAL DATA:  Possible right breast mass at recent screening mammography.  EXAM: DIGITAL DIAGNOSTIC RIGHT MAMMOGRAM WITH 3D TOMOSYNTHESIS WITH CAD  ULTRASOUND RIGHT BREAST  COMPARISON:  Previous examinations, including the screening mammogram dated 12/15/2014.  ACR Breast Density Category c: The breast tissue is heterogeneously dense, which may obscure small masses.  FINDINGS: 3D tomographic images of the right breast confirm an area of ill-defined increased density with architectural distortion and faint microcalcifications deep in the upper-outer quadrant of the right breast.  Mammographic images were processed with CAD.  On physical  exam, there is mild palpable soft tissue thickening and focal tenderness to palpation in the 10 o'clock position of the right breast, 6 cm from the nipple. There are no palpable right axillary lymph nodes.  Targeted ultrasound is performed, showing a 1.7 x 1.4 x 1.3 cm irregular hypoechoic mass with ill-defined surrounding increased echogenicity in the 10 o'clock position of the right breast, 6 cm from the nipple. There are no abnormal appearing right axillary lymph nodes.  IMPRESSION: 1.7 cm irregular mass in the 10 o'clock position of the right breast with imaging features highly suspicious for malignancy.  RECOMMENDATION: Right breast ultrasound-guided core needle biopsy. This has been scheduled for 3 p.m. on 12/26/2014.  I have discussed the findings and recommendations with the patient. Results were also provided in writing at the conclusion of the visit. If applicable, a reminder letter will be sent to the patient regarding the next appointment.  BI-RADS CATEGORY  5: Highly suggestive of malignancy.   Electronically Signed   By: Claudie Revering M.D.   On: 12/22/2014 16:33   Mm Diag Breast Tomo Uni Right  12/22/2014   CLINICAL DATA:  Possible right breast mass at recent screening mammography.  EXAM: DIGITAL DIAGNOSTIC RIGHT MAMMOGRAM WITH 3D TOMOSYNTHESIS WITH CAD  ULTRASOUND RIGHT BREAST  COMPARISON:  Previous examinations, including the screening mammogram dated 12/15/2014.  ACR Breast Density Category c: The breast tissue is heterogeneously dense, which may obscure small masses.  FINDINGS: 3D tomographic images of the right breast confirm an area of ill-defined increased density with architectural distortion and faint microcalcifications deep in the upper-outer quadrant of the right breast.  Mammographic images were processed with CAD.  On physical exam, there is mild palpable soft tissue thickening and focal tenderness to palpation in the 10 o'clock position of the right breast, 6 cm from the nipple. There  are no palpable right axillary lymph nodes.  Targeted ultrasound is performed, showing a 1.7 x 1.4 x 1.3 cm irregular hypoechoic mass with ill-defined surrounding increased echogenicity in the 10 o'clock position of the right breast, 6 cm from the nipple. There are no abnormal appearing right axillary lymph nodes.  IMPRESSION: 1.7 cm irregular mass in the 10 o'clock position of the right breast with imaging features highly suspicious for malignancy.  RECOMMENDATION: Right breast ultrasound-guided core needle biopsy. This has been scheduled for 3 p.m. on 12/26/2014.  I have discussed the findings and recommendations with the patient. Results were also provided in writing at the conclusion of the visit. If applicable, a reminder letter will be sent to the patient regarding the next appointment.  BI-RADS CATEGORY  5: Highly suggestive of malignancy.   Electronically Signed   By: Claudie Revering M.D.   On: 12/22/2014 16:33   [place] Rt Breast Bx W Loc Dev 1st Lesion Img Bx Spec US Guide  12/31/2014   ADDENDUM REPORT: 12/29/2014 11:14  ADDENDUM: Pathology revealed GRADE I TO II INVASIVE DUCTAL CARCINOMA in the right breast. This was found to be concordant by Dr. Evangeline Dakin. Pathology was discussed with the patient by telephone. She reported doing well after the biopsy with tenderness at the site. Post biopsy instructions and care were reviewed and her questions were answered. She has been [facility] at [facility] on [facility] 07[facility] 2015. My number was provided for additional questions and concerns.  Pathology results reported by Susa Raring RN, BSN on Dec 29, 2014.   Electronically Signed   By: Evangeline Dakin M.D.   On: 12/29/2014 11:14   12/31/2014   CLINICAL DATA:  Screening detected suspicious 1.7 cm mass in the upper outer quadrant of the right breast.  EXAM: ULTRASOUND GUIDED RIGHT BREAST CORE NEEDLE BIOPSY  COMPARISON:  Previous exam(s).  FINDINGS: I met with the patient and we  discussed the procedure of ultrasound-guided biopsy, including benefits and alternatives. We discussed the high likelihood of a successful procedure. We discussed the risks of the procedure, including infection, bleeding, tissue injury, clip migration, and inadequate sampling. Informed written consent was given. The usual time-out protocol was performed immediately prior to the procedure.  Using sterile technique and 2% Lidocaine as local anesthetic, under direct ultrasound visualization, a 14 gauge Bard Marquee core needle device was placed through a 13 gauge introducer needle and was used to perform biopsy of the suspicious 1.7 cm mass at the 10 o'clock position of the right breast 6 cm from the nipple using a lateral approach. At the conclusion of the procedure a ribbon shaped tissue marker clip was deployed into the biopsy cavity. Follow up 2 view mammogram was performed and dictated separately.  IMPRESSION: Ultrasound guided biopsy of a suspicious 1.7 cm mass in the upper outer quadrant of the right breast. No apparent complications.  Electronically Signed: By: Evangeline Dakin M.D. On: 12/26/2014 16:19    ASSESSMENT: 54 y.o. [place] woman status post right breast biopsy 12/26/2014 for a clinical T1 cN0, stage IA invasive ductal carcinoma, grade 1 or 2, prognostic panel pending  (1) the patient is a good candidate for breast conservation surgery  (2) chemotherapy will be necessary if the tumor is triple negative or HER-2 positive; the tumor is estrogen receptor positive and HER-2 negative we will request an Oncotype;  (3) adjuvant radiation to follow chemotherapy  (4) anti-estrogens to follow radiation  PLAN: We spent the better part of today's hour-long appointment discussing the biology of breast cancer in general, and the specifics of the patient's tumor in particular. Bonnie Ward understands we still lack crucial information regarding her systemic therapy options. Accordingly we discussed the  fact that if her cancer is triple negative, she will definitely need chemotherapy. Also  if the cancer is node positive, she would need chemotherapy.   If the cancer turns out to be HER-2 positive, she would need immunotherapy and that is generally coupled with chemotherapy when given with curative intent. If on the other hand the cancer is HER-2 negative and estrogen receptor positive, the benefit of chemotherapy might be marginal enough that it could be omitted. To help [place] with that decision we would request an Oncotype.  In terms of local treatment she understands there is no survival benefit between lumpectomy and mastectomy so long as the lumpectomy is followed by radiation.  She already has a trip to [place] planned for the third week in June. She wonders if her surgery can be postponed until after that trip. If the tumor is estrogen receptor positive, I would start her on tamoxifen and she could postpone the surgery as long as she would like. However if the tumor is triple negative, my advice would be to have the surgery as soon as possible.  Tentatively I am making her a return appointment for a month from now, but if she opts for postponing the surgery I would see her most likely late June or early July  Bonnie Ward has a good understanding of the overall plan. She agrees with it. She knows the goal of treatment in her case is cure. She will call with any problems that may develop before her next visit here.  Chauncey Cruel, MD   01/07/2015 5:21 PM Medical Oncology and Hematology [facility] [address] [place], [place] [address] Tel. [phone]    Fax. [phone]

## 2015-01-07 NOTE — Therapy (Signed)
[place], [place], [address] Phone: [phone]   Fax:  [phone]  Physical Therapy Evaluation  Patient Details  Name: Bonnie Ward MRN: [id number] Date of Birth: [birth year] Referring Provider:  Fanny Skates, MD  Encounter Date: 01/07/2015      PT End of Session - 01/07/15 1138    Visit Number 1   Number of Visits 1   PT Start Time [phone]   PT Stop Time 1000  Also saw pt from 1025-1035   PT Time Calculation (min) 18 min   Activity Tolerance Patient tolerated treatment well   Behavior During Therapy [facility] for tasks assessed/performed      Past Medical History  Diagnosis Date  . GLAUCOMA, LEFT EYE 01/13/2009  . ALLERGIC RHINITIS 01/13/2009  . ACID REFLUX DISEASE 07/12/2010  . UTI'S, RECURRENT 03/10/2009  . Arachnoid cyst     right frontal  . Hx of varicella   . Metatarsal stress fracture 2012    hx of same   . IBS (irritable bowel syndrome)   . Abnormal Pap smear 05/2000    CIN-I/HPV  . Arachnoid cyst     left frontal lobe  . Fracture 2004    fractured foot  . Breast cancer   . Arthritis     Past Surgical History  Procedure Laterality Date  . Tonsillectomy  age 69  . Colonoscopy w/ biopsies  10/2010    pre cancer polyp, recheck in 3 years  . Esophagogastroduodenoscopy endoscopy  10/2010    hiatal hernia  . Tooth extraction      There were no vitals filed for this visit.  Visit Diagnosis:  Carcinoma of upper-outer quadrant of right female breast - Plan: PT plan of care cert/re-cert  Decreased right shoulder range of motion - Plan: PT plan of care cert/re-cert  Abnormal posture - Plan: PT plan of care cert/re-cert      Subjective Assessment - 01/07/15 1139    Pertinent History Diagnosed 12/29/14 with right breast cancer measuring 1.7 cm in size.  Her prognostic panel is pending.  It is grade 1-2 invasive ductal carcinoma and is located in the upper outer quadrant of the posterior  right breast.            [facility] PT Assessment - 01/07/15 0001    Assessment   Medical Diagnosis Right breast cancer   Onset Date 12/29/14   Precautions   Precautions Other (comment)  Active breast cancer   Restrictions   Weight Bearing Restrictions No   Balance Screen   Has the patient fallen in the past 6 months No   Has the patient had a decrease in activity level because of a fear of falling?  No   Is the patient reluctant to leave their home because of a fear of falling?  No   Home Environment   Living Enviornment Private residence   Living Arrangements Alone   Available Help at Discharge Friend(s)   Prior Function   Level of Independence Independent with basic ADLs   Vocation Full time employment  Hairstylist   Vocation Requirements Prolonged standing and use of upper extremities   Leisure She exercises 5x/wk doing spin class, weights and walking dog   Cognition   Overall Cognitive Status Within Functional Limits for tasks assessed   Posture/Postural Control   Posture/Postural Control Postural limitations   Postural Limitations Rounded Shoulders;Forward head   ROM / Strength   AROM / PROM / Strength AROM;Strength  AROM   AROM Assessment Site Shoulder   Right/Left Shoulder Right;Left   Right Shoulder Extension 42 Degrees   Right Shoulder Flexion 130 Degrees   Right Shoulder ABduction 144 Degrees   Right Shoulder Internal Rotation 72 Degrees   Right Shoulder External Rotation 77 Degrees   Left Shoulder Extension 53 Degrees   Left Shoulder Flexion 155 Degrees   Left Shoulder ABduction 159 Degrees   Left Shoulder Internal Rotation 71 Degrees   Left Shoulder External Rotation 87 Degrees   Strength   Overall Strength Within functional limits for tasks performed           LYMPHEDEMA/ONCOLOGY QUESTIONNAIRE - 01/07/15 1129    Type   Cancer Type Right breast cancer   Lymphedema Assessments   Lymphedema Assessments Upper extremities   Right Upper Extremity  Lymphedema   10 cm Proximal to Olecranon Process 25.9 cm   Olecranon Process 23 cm   10 cm Proximal to Ulnar Styloid Process 20 cm   Just Proximal to Ulnar Styloid Process 14.4 cm   Across Hand at [employer] 17.9 cm   At Standard of 2nd Digit 5.6 cm   Left Upper Extremity Lymphedema   10 cm Proximal to Olecranon Process 26.3 cm   Olecranon Process 23.1 cm   10 cm Proximal to Ulnar Styloid Process 19.7 cm   Just Proximal to Ulnar Styloid Process 14.4 cm   Across Hand at [employer] 17 cm   At [place] of 2nd Digit 5.3 cm      Patient was instructed today in a home exercise program today for post op shoulder range of motion. These included active assist shoulder flexion in sitting, scapular retraction, wall walking with shoulder abduction, and hands behind head external rotation.  She was encouraged to do these twice a day, holding 3 seconds and repeating 5 times when permitted by her physician.         PT Education - 01/07/15 1131    Education provided Yes   Education Details Post op shoulder ROM HEP and lymphedema risk reduction   Person(s) Educated Patient   Methods Explanation;Demonstration;Handout   Comprehension Verbalized understanding              Breast Clinic Goals - 01/07/15 1143    Patient will be able to verbalize understanding of pertinent lymphedema risk reduction practices relevant to her diagnosis specifically related to skin care.   Time 1   Period Days   Status Achieved   Patient will be able to return demonstrate and/or verbalize understanding of the post-op home exercise program related to regaining shoulder range of motion.   Time 1   Period Days   Status Achieved   Patient will be able to verbalize understanding of the importance of attending the postoperative After Breast Cancer Class for further lymphedema risk reduction education and therapeutic exercise.   Time 1   Period Days   Status Achieved              Plan - 01/07/15 1139     Clinical Impression Statement Diagnosed 12/29/14 with right breast cancer measuring 1.7 cm in size.  Her prognostic panel is pending.  It is grade 1-2 invasive ductal carcinoma and is located in the upper outer quadrant of the posterior right breast.  She is planning to have a right lumpectomy and sentinel node biopsy followed by radiation.  She may undergo chemotherapy and/or anti-estrogen therapy depending on her prognositc panel results.  She will  benefit from post op PT to regain shoulder ROM and reduce lymphedema risk.   Pt will benefit from skilled therapeutic intervention in order to improve on the following deficits Decreased range of motion;Pain;Impaired UE functional use;Decreased strength;Decreased knowledge of precautions   Rehab Potential Excellent   Clinical Impairments Affecting Rehab Potential none   PT Frequency One time visit   PT Treatment/Interventions Therapeutic exercise;Patient/family education   Consulted and Agree with Plan of Care Patient     Patient will follow up at outpatient cancer rehab if needed following surgery.  If the patient requires physical therapy at that time, a specific plan will be dictated and sent to the referring physician for approval. The patient was educated today on appropriate basic range of motion exercises to begin post operatively and the importance of attending the After Breast Cancer class following surgery.  Patient was educated today on lymphedema risk reduction practices as it pertains to recommendations that will benefit the patient immediately following surgery.  She verbalized good understanding.  No additional physical therapy is indicated at this time.       Problem List Patient Active Problem List   Diagnosis Date Noted  . Breast cancer of upper-outer quadrant of right female breast 12/31/2014  . Hormone replacement therapy (postmenopausal) 10/18/2010  . Arachnoid cyst   . ACID REFLUX DISEASE 07/12/2010  . DYSPHAGIA UNSPECIFIED  07/12/2010  . WRIST PAIN, RIGHT 04/22/2010  . STRESS FRACTURE OF THE METATARSALS 04/22/2010  . BACK PAIN 10/05/2009  . UTI'S, RECURRENT 03/10/2009  . GLAUCOMA, LEFT EYE 01/13/2009  . ALLERGIC RHINITIS 01/13/2009  . HEADACHE 01/13/2009    Arlan Birks,MARTI COOPER, PT 01/07/2015, 11:44 AM  [place], [place], [address] Phone: [phone]   Fax:  [phone]

## 2015-01-07 NOTE — Progress Notes (Signed)
Radiation Oncology         [phone] ________________________________  Initial outpatient Consultation - Date: 01/07/2015   Name: Bonnie Ward MRN: [id number]   DOB: 14-Jul-1961  REFERRING PHYSICIAN: Fanny Skates, MD  DIAGNOSIS AND STAGE: Stage 1 invasive ductal carcinoma of the right breast.   HISTORY OF PRESENT ILLNESS::Bonnie Ward is a 54 y.o. female  presented for a screening mammogram. The mammogram found a mass in the upper-outer quadrant of the right breast. A 1.77 mm mass was seen on the ultrasound. The biopsy showed a grade I-II invasive ductal carcinoma. Prognostic profile is pending. The patient has done well since her biopsy. She is accompanied by good friend. The patient is interested in breast conservation.  PREVIOUS RADIATION THERAPY: No  Past medical, social and family history were reviewed in the electronic chart. Review of symptoms was reviewed in the electronic chart. Medications were reviewed in the electronic chart.   Gynecologic History  Age at first menstrual period? 11  Are you still having periods? No Approximate date of last period? 5 years ago  If you no longer have periods: Have you used hormone replacement? Yes  If YES, for how long? 2 years When did you stop? Still using. Obstetric History:  How many children have you carried to term? 0  Pregnant now or trying to get pregnant? No  Have you used birth control pills or hormone shots for contraception? Yes  If so, for how long (or approximate dates)? Intermittently for 3 years.  Would you be interested in learning more about the options to preserve fertility? No Health Maintenance:  Have you ever had a colonoscopy? Yes If yes, date? 2/16  Have you ever had a bone density? Yes If yes, date? 10/15  Date of your last PAP smear? 6/15 Date of your FIRST mammogram? Age 72   PHYSICAL EXAM: There were no vitals filed for this visit.. . Pleasant female. No distress. No palpable cervical, supraclavicular or  axillary adenopathy. Biopsy change in the upper outer quadrant of the right breast.  IMPRESSION: T1cN0 invasive ductal carcinoma of the right breast  PLAN: I spoke to the patient today regarding her diagnosis and options for treatment. We discussed the equivalence in terms of survival and local failure between mastectomy and breast conservation. We discussed the role of radiation in decreasing local failures in patients who undergo lumpectomy. We discussed the process of simulation and the placement tattoos. We discussed 4-6 weeks of treatment as an outpatient. We discussed the possibility of asymptomatic lung damage. We discussed the low likelihood of secondary malignancies. We discussed the possible side effects including but not limited to skin redness, fatigue, permanent skin darkening, and breast swelling. We discussed the process of simulation and the placement of tattoos. As her prognostic profile is still pending, it is unclear whether she will require chemotherapy or not. I did clarify with her that if she needed chemotherapy this would be performed prior to radiation. She met with medical oncology as well as a member of our patient family support team and our physical therapist. I will plan on seeing her back after her surgery and the decision has been made regarding chemotherapy.   I spent 40 minutes  face to face with the patient and more than 50% of that time was spent in counseling and/or coordination of care.  This document serves as a record of services personally performed by Thea Silversmith, MD. It was created on her behalf by Darcus Austin, a trained medical  scribe. The creation of this record is based on the scribe's personal observations and the provider's statements to them. This document has been checked and approved by the attending provider.  ------------------------------------------------  Thea Silversmith, MD

## 2015-01-07 NOTE — Progress Notes (Signed)
Clinical Social Work [place] Psychosocial Distress Screening [place]  Patient completed distress screening protocol and scored a 5 on the Psychosocial Distress Thermometer which indicates moderate distress. Clinical Social Worker met with patient and patients friend in [facility] to assess for distress and other psychosocial needs. Patient stated she felt "better" after meeting with the treatment team and getting information on her treatment plan. CSW and patient discussed common feeling and emotions when being diagnosed with cancer, and the importance of support during treatment. CSW informed patient of the support team and support services at [facility], and patient was agreeable to an [place] referal.CSW provided contact information and encouraged patient to call with any questions or concerns.   ONCBCN DISTRESS SCREENING 01/07/2015  Screening Type Initial Screening  Distress experienced in past week (1-10) 5  Practical problem type Insurance  Emotional problem type Nervousness/Anxiety  Physician notified of physical symptoms Yes  Referral to clinical psychology No  Referral to clinical social work Yes  Referral to dietition No  Referral to financial advocate No  Referral to support programs Yes  Referral to palliative care No   Johnnye Lana, MSW, LCSW, OSW-C Clinical Social Worker [place] [phone]

## 2015-01-07 NOTE — Progress Notes (Signed)
Check in new pt that is technically uninsured.  She is having a problem with her insurance that is supposed to be fixed next week.  I gave her the billing department's number as well as my contact info so when her insurance is effective we can add to her chart and resubmit claims for reimbursement.

## 2015-01-07 NOTE — Progress Notes (Signed)
Bonnie Ward is a very pleasant 54 y.o. female from [place], [place] with newly diagnosed grade 1-2 invasive ductal carcinoma of the right breast.  The tumor's prognostic profile is currently pending.   She presents today with her friend to the [facility]) for treatment consideration and recommendations from the breast surgeon, radiation oncologist, and medical oncologist.     I briefly met with Bonnie Ward and her friend during her [facility] visit today. We discussed the purpose of the Survivorship Clinic, which will include monitoring for recurrence, coordinating completion of age and gender-appropriate cancer screenings, promotion of overall wellness, as well as managing potential late/long-term side effects of anti-cancer treatments.    The treatment plan for Bonnie Ward will likely include surgery, radiation therapy, and potentially anti-estrogen therapy based on the prognostic profile.  As of today, the intent of treatment for Bonnie Ward is cure, therefore she will be eligible for the Survivorship Clinic upon her completion of treatment.  Her survivorship care plan (SCP) document will be drafted and updated throughout the course of her treatment trajectory. She will receive the SCP in an office visit with myself in the Survivorship Clinic once she has completed treatment.   Bonnie Ward was encouraged to ask questions and all questions were answered to her satisfaction.  She was given my business card and encouraged to contact me with any concerns regarding survivorship.  I look forward to participating in her care.   Mike Craze, NP [place] [phone]

## 2015-01-07 NOTE — Patient Instructions (Signed)

## 2015-01-08 ENCOUNTER — Other Ambulatory Visit: Payer: Self-pay | Admitting: General Surgery

## 2015-01-08 ENCOUNTER — Telehealth: Payer: Self-pay | Admitting: Oncology

## 2015-01-08 ENCOUNTER — Telehealth: Payer: Self-pay | Admitting: Nurse Practitioner

## 2015-01-08 DIAGNOSIS — C50411 Malignant neoplasm of upper-outer quadrant of right female breast: Secondary | ICD-10-CM

## 2015-01-08 NOTE — Telephone Encounter (Signed)
Left pt. A message that we were aware of the breast pathology and knew she has already seen the Oncologist. If there was anything we could do to call us back.

## 2015-01-08 NOTE — Telephone Encounter (Signed)
s.w. pt and advised on 6.20 appt....she could not do....emialed MD for new d.t. before she leaves country on 6.15 thru 6.23

## 2015-01-11 ENCOUNTER — Other Ambulatory Visit: Payer: Self-pay | Admitting: Oncology

## 2015-01-12 ENCOUNTER — Other Ambulatory Visit: Payer: Self-pay | Admitting: Oncology

## 2015-01-12 DIAGNOSIS — C50919 Malignant neoplasm of unspecified site of unspecified female breast: Secondary | ICD-10-CM

## 2015-01-12 NOTE — Progress Notes (Unsigned)
I called Diovan and let her know the results of her prognostic panel. This is very favorable. It shows her tumor to be triple positive. This does mean she will need a port and it also means that she will need some chemotherapy with her anti-HER-2 treatment. That of course is not the news she wanted to hear. In the long run however quit this will do is cut her risk of recurrence down to his sixth, as opposed to a half if the only systemic treatment we used was antiestrogen pills.  I send Dr. Dalbert Batman and note asking him to place a port at the time of the definitive lumpectomy.

## 2015-01-13 ENCOUNTER — Telehealth: Payer: Self-pay | Admitting: *Deleted

## 2015-01-13 ENCOUNTER — Telehealth (HOSPITAL_COMMUNITY): Payer: Self-pay | Admitting: Vascular Surgery

## 2015-01-13 ENCOUNTER — Telehealth: Payer: Self-pay | Admitting: Oncology

## 2015-01-13 NOTE — Telephone Encounter (Signed)
Left vm for pt to return call regarding [place] from 01/07/15.

## 2015-01-13 NOTE — Telephone Encounter (Signed)
lvm fo rpt regarding to chemo edu class

## 2015-01-13 NOTE — Telephone Encounter (Signed)
Spoke to pt concerning [place] from 01/07/15. She was informed by Dr. Jana Hakim of her prognostic panel and the need for chemo. Discussed working while receiving chemo and time out after surgery.  Confirmed surgery date and f/u appts. Encourage pt to call with questions or concerns. Received verbal understanding. Contact information given.

## 2015-01-13 NOTE — Telephone Encounter (Signed)
Left message to make a new pt appt.. W/ ECHO

## 2015-01-14 ENCOUNTER — Telehealth: Payer: Self-pay | Admitting: Oncology

## 2015-01-14 NOTE — Telephone Encounter (Signed)
returned call and lvm for pt confirming chemo edu appt and location

## 2015-01-20 ENCOUNTER — Other Ambulatory Visit: Payer: Self-pay | Admitting: General Surgery

## 2015-01-21 NOTE — H&P (Signed)
Bonnie Ward  Location: [facility] Patient #: [id number] DOB: 06/04/1961 Undefined / Language: Undefined / Race: Undefined Female       History of Present Illness The patient is a 54 year old female who presents with breast cancer.    This is a very pleasant 54 year old Caucasian female, referred by Dr. Peggye Fothergill at the breast center of [facility] for evaluation and management of invasive dtuctal carcinoma right breast, upper outer quadrant. her PCP is Dr. Shanon Ace and Dr. Adriana Simas. She is being seen in the [facility] today by Dr. Jana Hakim and Dr. Pablo Ledger and me she is divorced. Has no children. She is here with Lodema Pilot, one of her friends. recent screening mammograms show a 1.7 cm. mass in the right breast at the 10:00 position, 6 cm from the nipple, this looks very posterior and lateral. No adenopathy. Biopsy shows a grade 1-2 invasive ductal carcinoma. Breast diagnostic profile is pending. no prior breast problems. Comorbidities include that she is a former smoker, GERD, mild irritable bowel syndrome, arachnoid cyst, mild glaucoma. Mother died of a GI perforation. Father died. No family history of breast or ovarian or prostate cancer. As mentioned above, she is divorced, no choldren, has trip to [place] 02/05/2015 and would like to get her surgery done quickly. . Rare alcohol use. She quit tobacco use in 2016. Her sister will stay with her postop. she is strongly motivated for breast conservation, and I think she is a good candidate for that. She will be scheduled for right partial mastectomy with radioactive seed localization and right axillary sentinel node biopsy. I discussed the indications, details, technique risk of bleeding, infection, reoperation for positive marglns or positive nodes, arm swelling, arm numbness, cosmetic deformity. She understands all of these issues.rm numbness. She understands all of these issues. All of her questions are  answered. She agrees with this plan. she understands that she will also need chemotherapy and a port.. if she is triple negative. My office will call her tomorrow to begin the scheduling process.    Other Problems  Arthritis Breast Cancer Gastroesophageal Reflux Disease General anesthesia - complications Lump In Breast Melanoma Migraine Headache Thyroid Disease  Past Surgical History  Breast Biopsy Right. Colon Polyp Removal - Colonoscopy Oral Surgery Tonsillectomy  Diagnostic Studies History Colonoscopy within last year Mammogram within last year Pap Smear 1-5 years ago  Social History  Alcohol use Occasional alcohol use. Caffeine use Coffee. Illicit drug use Remotely quit drug use. Tobacco use Former smoker.  Family History  Arthritis Father, Mother. Breast Cancer Family Members In General. Diabetes Mellitus Father. Heart Disease Family Members In General. Hypertension Father. Thyroid problems Mother.  Pregnancy / Birth History  Age at menarche 81 years. Age of menopause 80-50 Contraceptive History Oral contraceptives. Gravida 0 Irregular periods Para 0  Review of Systems General Not Present- Appetite Loss, Chills, Fatigue, Fever, Night Sweats, Weight Gain and Weight Loss. Skin Not Present- Change in Wart/Mole, Dryness, Hives, Jaundice, New Lesions, Non-Healing Wounds, Rash and Ulcer. HEENT Present- Seasonal Allergies and Wears glasses/contact lenses. Not Present- Earache, Hearing Loss, Hoarseness, Nose Bleed, Oral Ulcers, Ringing in the Ears, Sinus Pain, Sore Throat, Visual Disturbances and Yellow Eyes. Respiratory Not Present- Bloody sputum, Chronic Cough, Difficulty Breathing, Snoring and Wheezing. Breast Present- Breast Mass. Not Present- Breast Pain, Nipple Discharge and Skin Changes. Cardiovascular Not Present- Chest Pain, Difficulty Breathing Lying Down, Leg Cramps, Palpitations, Rapid Heart Rate, Shortness of Breath  and Swelling of Extremities. Female Genitourinary Not Present-  Frequency, Nocturia, Painful Urination, Pelvic Pain and Urgency. Musculoskeletal Not Present- Back Pain, Joint Pain, Joint Stiffness, Muscle Pain, Muscle Weakness and Swelling of Extremities. Neurological Not Present- Decreased Memory, Fainting, Headaches, Numbness, Seizures, Tingling, Tremor, Trouble walking and Weakness. Psychiatric Present- Anxiety. Not Present- Bipolar, Change in Sleep Pattern, Depression, Fearful and Frequent crying. Endocrine Not Present- Cold Intolerance, Excessive Hunger, Hair Changes, Heat Intolerance, Hot flashes and New Diabetes. Hematology Not Present- Easy Bruising, Excessive bleeding, Gland problems, HIV and Persistent Infections.   Physical Exam  General Mental Status-Alert. General Appearance-Consistent with stated age. Hydration-Well hydrated. Voice-Normal.  Head and Neck Head-normocephalic, atraumatic with no lesions or palpable masses. Trachea-midline. Thyroid Gland Characteristics - normal size and consistency.  Eye Eyeball - Bilateral-Extraocular movements intact. Sclera/Conjunctiva - Bilateral-No scleral icterus.  Chest and Lung Exam Chest and lung exam reveals -quiet, even and easy respiratory effort with no use of accessory muscles and on auscultation, normal breath sounds, no adventitious sounds and normal vocal resonance. Inspection Chest Wall - Normal. Back - normal.  Breast Breast - Left-Symmetric, Non Tender, No Biopsy scars, no Dimpling, No Inflammation, No Lumpectomy scars, No Mastectomy scars, No Peau d' Orange. Breast - Right-Symmetric, Non Tender, No Biopsy scars, no Dimpling, No Inflammation, No Lumpectomy scars, No Mastectomy scars, No Peau d' Orange. Breast Lump-No Palpable Breast Mass. Note: small bruise right breast laterally. I question a small palpable mass laterally on the right. Might simply be a tiny hematoma. No other skin change. No  axillary adenopathy.left breast unremarkable   Cardiovascular Cardiovascular examination reveals -normal heart sounds, regular rate and rhythm with no murmurs and normal pedal pulses bilaterally.  Abdomen Inspection Inspection of the abdomen reveals - No Hernias. Skin - Scar - no surgical scars. Palpation/Percussion Palpation and Percussion of the abdomen reveal - Soft, Non Tender, No Rebound tenderness, No Rigidity (guarding) and No hepatosplenomegaly. Auscultation Auscultation of the abdomen reveals - Bowel sounds normal.  Neurologic Neurologic evaluation reveals -alert and oriented x 3 with no impairment of recent or remote memory. Mental Status-Normal.  Musculoskeletal Normal Exam - Left-Upper Extremity Strength Normal and Lower Extremity Strength Normal. Normal Exam - Right-Upper Extremity Strength Normal and Lower Extremity Strength Normal.  Lymphatic Head & Neck  General Head & Neck Lymphatics: Bilateral - Description - Normal. Axillary  General Axillary Region: Bilateral - Description - Normal. Tenderness - Non Tender. Femoral & Inguinal  Generalized Femoral & Inguinal Lymphatics: Bilateral - Description - Normal. Tenderness - Non Tender.    Assessment & Plan PRIMARY CANCER OF UPPER OUTER QUADRANT OF RIGHT FEMALE BREAST (174.4  C50.411)   Schedule for Surgery your recent mammograms and biopsy show a 1.7 cm invasive ductal carcinoma of the right breast. Your hormone receptors and HER-2/neu test are still pending. we have discussed surgical options.. you have stated that, if possible,you would like breast conservation surgery if possible, and Dr. Dalbert Batman thinks that you are a good candidate for that. If your cancer turns out to be a triple negative cancer , then you might need chemotherapy and a Port-A-Cath. We will make that decision in a few days. We will go aread and schedule you for a right partial mastectomy with radioactive seed localization right  axillary sentinel lymph node biopsy We discussed the techniques and risks of the surgery in detail Please read the written information that I gave you Dr. Darrel Hoover office will call you tomorrow to begin the scheduling process. Pt Education - Lumpectomy and Axillary Lymph Node Dissection: sentinel lymph nodes  FORMER  SMOKER (V15.82  Z87.891)  ARACHNOID CYST (348.0  G93.0)  CHRONIC GERD (530.81  K21.9)    Dekker Verga M. Dalbert Batman, M.D., [facility] General and Minimally invasive Surgery Breast and Colorectal Surgery Office:   [phone] Pager:   [phone]

## 2015-01-22 ENCOUNTER — Other Ambulatory Visit: Payer: Self-pay | Admitting: General Surgery

## 2015-01-22 ENCOUNTER — Encounter (HOSPITAL_BASED_OUTPATIENT_CLINIC_OR_DEPARTMENT_OTHER): Payer: Self-pay | Admitting: *Deleted

## 2015-01-22 ENCOUNTER — Ambulatory Visit
Admission: RE | Admit: 2015-01-22 | Discharge: 2015-01-22 | Disposition: A | Discharge status: Home / Self Care | Payer: BLUE CROSS/BLUE SHIELD | Source: Ambulatory Visit | Attending: General Surgery | Admitting: General Surgery

## 2015-01-22 ENCOUNTER — Ambulatory Visit (HOSPITAL_COMMUNITY): Admission: RE | Admit: 2015-01-22 | Payer: BLUE CROSS/BLUE SHIELD | Source: Ambulatory Visit

## 2015-01-22 DIAGNOSIS — Z01818 Encounter for other preprocedural examination: Secondary | ICD-10-CM

## 2015-01-22 DIAGNOSIS — C50411 Malignant neoplasm of upper-outer quadrant of right female breast: Secondary | ICD-10-CM

## 2015-01-22 NOTE — Pre-Procedure Instructions (Signed)
Had CBC, diff, CMET 01/07/2015

## 2015-01-23 ENCOUNTER — Ambulatory Visit (HOSPITAL_BASED_OUTPATIENT_CLINIC_OR_DEPARTMENT_OTHER): Payer: BLUE CROSS/BLUE SHIELD | Admitting: Anesthesiology

## 2015-01-23 ENCOUNTER — Ambulatory Visit
Admission: RE | Admit: 2015-01-23 | Discharge: 2015-01-23 | Disposition: A | Discharge status: Home / Self Care | Payer: BLUE CROSS/BLUE SHIELD | Source: Ambulatory Visit | Attending: General Surgery | Admitting: General Surgery

## 2015-01-23 ENCOUNTER — Ambulatory Visit (HOSPITAL_COMMUNITY): Payer: BLUE CROSS/BLUE SHIELD

## 2015-01-23 ENCOUNTER — Encounter (HOSPITAL_COMMUNITY)
Admission: RE | Admit: 2015-01-23 | Discharge: 2015-01-23 | Disposition: A | Discharge status: Home / Self Care | Payer: BLUE CROSS/BLUE SHIELD | Source: Ambulatory Visit | Attending: General Surgery | Admitting: General Surgery

## 2015-01-23 ENCOUNTER — Encounter (HOSPITAL_BASED_OUTPATIENT_CLINIC_OR_DEPARTMENT_OTHER): Payer: Self-pay

## 2015-01-23 ENCOUNTER — Ambulatory Visit (HOSPITAL_BASED_OUTPATIENT_CLINIC_OR_DEPARTMENT_OTHER)
Admission: RE | Admit: 2015-01-23 | Discharge: 2015-01-23 | Disposition: A | Discharge status: Home / Self Care | Payer: BLUE CROSS/BLUE SHIELD | Source: Ambulatory Visit | Attending: General Surgery | Admitting: General Surgery

## 2015-01-23 ENCOUNTER — Encounter (HOSPITAL_BASED_OUTPATIENT_CLINIC_OR_DEPARTMENT_OTHER)
Admission: RE | Disposition: A | Discharge status: Home / Self Care | Payer: Self-pay | Source: Ambulatory Visit | Attending: General Surgery

## 2015-01-23 DIAGNOSIS — Z95828 Presence of other vascular implants and grafts: Secondary | ICD-10-CM

## 2015-01-23 DIAGNOSIS — C50411 Malignant neoplasm of upper-outer quadrant of right female breast: Secondary | ICD-10-CM | POA: Diagnosis present

## 2015-01-23 DIAGNOSIS — G43909 Migraine, unspecified, not intractable, without status migrainosus: Secondary | ICD-10-CM | POA: Insufficient documentation

## 2015-01-23 DIAGNOSIS — C773 Secondary and unspecified malignant neoplasm of axilla and upper limb lymph nodes: Secondary | ICD-10-CM | POA: Diagnosis not present

## 2015-01-23 DIAGNOSIS — C50911 Malignant neoplasm of unspecified site of right female breast: Secondary | ICD-10-CM | POA: Insufficient documentation

## 2015-01-23 DIAGNOSIS — M1990 Unspecified osteoarthritis, unspecified site: Secondary | ICD-10-CM | POA: Insufficient documentation

## 2015-01-23 DIAGNOSIS — Z85820 Personal history of malignant melanoma of skin: Secondary | ICD-10-CM | POA: Insufficient documentation

## 2015-01-23 DIAGNOSIS — E079 Disorder of thyroid, unspecified: Secondary | ICD-10-CM | POA: Insufficient documentation

## 2015-01-23 DIAGNOSIS — F1910 Other psychoactive substance abuse, uncomplicated: Secondary | ICD-10-CM | POA: Insufficient documentation

## 2015-01-23 DIAGNOSIS — Z170 Estrogen receptor positive status [ER+]: Secondary | ICD-10-CM

## 2015-01-23 DIAGNOSIS — Z87891 Personal history of nicotine dependence: Secondary | ICD-10-CM | POA: Diagnosis not present

## 2015-01-23 DIAGNOSIS — K219 Gastro-esophageal reflux disease without esophagitis: Secondary | ICD-10-CM | POA: Insufficient documentation

## 2015-01-23 DIAGNOSIS — Z01818 Encounter for other preprocedural examination: Secondary | ICD-10-CM

## 2015-01-23 HISTORY — PX: RADIOACTIVE SEED GUIDED PARTIAL MASTECTOMY WITH AXILLARY SENTINEL LYMPH NODE BIOPSY: SHX6520

## 2015-01-23 HISTORY — DX: Stress fracture, unspecified foot, initial encounter for fracture: M84.376A

## 2015-01-23 HISTORY — DX: Other specified symptoms and signs involving the digestive system and abdomen: R19.8

## 2015-01-23 HISTORY — DX: Dental restoration status: Z98.811

## 2015-01-23 HISTORY — DX: Hypothyroidism, unspecified: E03.9

## 2015-01-23 HISTORY — DX: Other somatoform disorders: F45.8

## 2015-01-23 HISTORY — DX: Anxiety disorder, unspecified: F41.9

## 2015-01-23 HISTORY — DX: Dysphagia, unspecified: R13.10

## 2015-01-23 HISTORY — DX: Adverse effect of unspecified anesthetic, initial encounter: T41.45XA

## 2015-01-23 HISTORY — DX: Other seasonal allergic rhinitis: J30.2

## 2015-01-23 HISTORY — DX: Migraine, unspecified, not intractable, without status migrainosus: G43.909

## 2015-01-23 HISTORY — PX: PORTACATH PLACEMENT: SHX2246

## 2015-01-23 HISTORY — DX: Unspecified glaucoma: H40.9

## 2015-01-23 SURGERY — RADIOACTIVE SEED GUIDED PARTIAL MASTECTOMY WITH AXILLARY SENTINEL LYMPH NODE BIOPSY
Anesthesia: Regional | Site: Chest | Laterality: Right

## 2015-01-23 MED ORDER — METHYLENE BLUE 1 % INJ SOLN
INTRAMUSCULAR | Status: AC
Start: 2015-01-23 — End: 2015-01-23
  Filled 2015-01-23: qty 10

## 2015-01-23 MED ORDER — FENTANYL CITRATE (PF) 100 MCG/2ML IJ SOLN
INTRAMUSCULAR | Status: AC
  Filled 2015-01-23: qty 2

## 2015-01-23 MED ORDER — SODIUM CHLORIDE 0.9 % IJ SOLN: 3.0000 mL | Freq: Two times a day (BID) | INTRAMUSCULAR | Status: DC

## 2015-01-23 MED ORDER — SODIUM CHLORIDE 0.9 % IV SOLN: INTRAVENOUS | Status: DC

## 2015-01-23 MED ORDER — OXYCODONE HCL 5 MG PO TABS
5.0000 mg | ORAL_TABLET | Freq: Once | ORAL | Status: AC | PRN
  Administered 2015-01-23: 5 mg via ORAL

## 2015-01-23 MED ORDER — ACETAMINOPHEN 650 MG RE SUPP: 650.0000 mg | RECTAL | Status: DC | PRN

## 2015-01-23 MED ORDER — SODIUM CHLORIDE 0.9 % IJ SOLN
INTRAMUSCULAR | Status: DC | PRN
  Administered 2015-01-23: 5 mL

## 2015-01-23 MED ORDER — FENTANYL CITRATE (PF) 100 MCG/2ML IJ SOLN: 25.0000 ug | INTRAMUSCULAR | Status: DC | PRN

## 2015-01-23 MED ORDER — FENTANYL CITRATE (PF) 100 MCG/2ML IJ SOLN
INTRAMUSCULAR | Status: DC | PRN
  Administered 2015-01-23: 50 ug via INTRAVENOUS
  Administered 2015-01-23: 100 ug via INTRAVENOUS
  Administered 2015-01-23: 50 ug via INTRAVENOUS

## 2015-01-23 MED ORDER — OXYCODONE HCL 5 MG PO TABS
5.0000 mg | ORAL_TABLET | ORAL | Status: DC | PRN
Start: 2015-01-23 — End: 2015-01-23

## 2015-01-23 MED ORDER — OXYCODONE HCL 5 MG/5ML PO SOLN: 5.0000 mg | Freq: Once | ORAL | Status: AC | PRN

## 2015-01-23 MED ORDER — BUPIVACAINE-EPINEPHRINE (PF) 0.5% -1:200000 IJ SOLN
INTRAMUSCULAR | Status: DC | PRN
  Administered 2015-01-23: 18 mL via PERINEURAL

## 2015-01-23 MED ORDER — FENTANYL CITRATE (PF) 100 MCG/2ML IJ SOLN
50.0000 ug | INTRAMUSCULAR | Status: DC | PRN
  Administered 2015-01-23: 100 ug via INTRAVENOUS

## 2015-01-23 MED ORDER — HEPARIN SOD (PORK) LOCK FLUSH 100 UNIT/ML IV SOLN
INTRAVENOUS | Status: AC
  Filled 2015-01-23: qty 5

## 2015-01-23 MED ORDER — MIDAZOLAM HCL 2 MG/2ML IJ SOLN
INTRAMUSCULAR | Status: AC
  Filled 2015-01-23: qty 2

## 2015-01-23 MED ORDER — ONDANSETRON HCL 4 MG/2ML IJ SOLN: 4.0000 mg | Freq: Four times a day (QID) | INTRAMUSCULAR | Status: DC | PRN

## 2015-01-23 MED ORDER — CEFAZOLIN SODIUM-DEXTROSE 2-3 GM-% IV SOLR
INTRAVENOUS | Status: AC
  Filled 2015-01-23: qty 50

## 2015-01-23 MED ORDER — PROPOFOL 10 MG/ML IV BOLUS
INTRAVENOUS | Status: DC | PRN
  Administered 2015-01-23: 30 mg via INTRAVENOUS
  Administered 2015-01-23: 150 mg via INTRAVENOUS

## 2015-01-23 MED ORDER — BUPIVACAINE-EPINEPHRINE (PF) 0.5% -1:200000 IJ SOLN
INTRAMUSCULAR | Status: DC | PRN
  Administered 2015-01-23: 30 mL via PERINEURAL

## 2015-01-23 MED ORDER — HYDROMORPHONE HCL 1 MG/ML IJ SOLN
INTRAMUSCULAR | Status: AC
  Filled 2015-01-23: qty 1

## 2015-01-23 MED ORDER — SODIUM CHLORIDE 0.9 % IJ SOLN
INTRAMUSCULAR | Status: AC
  Filled 2015-01-23: qty 10

## 2015-01-23 MED ORDER — GLYCOPYRROLATE 0.2 MG/ML IJ SOLN: 0.2000 mg | Freq: Once | INTRAMUSCULAR | Status: DC | PRN

## 2015-01-23 MED ORDER — HYDROCODONE-ACETAMINOPHEN 5-325 MG PO TABS: 1.0000 | ORAL_TABLET | Freq: Four times a day (QID) | ORAL | Status: DC | PRN

## 2015-01-23 MED ORDER — HEPARIN (PORCINE) IN NACL 2-0.9 UNIT/ML-% IJ SOLN
INTRAMUSCULAR | Status: DC | PRN
  Administered 2015-01-23: 1 via INTRAVENOUS

## 2015-01-23 MED ORDER — LACTATED RINGERS IV SOLN
INTRAVENOUS | Status: DC
  Administered 2015-01-23 (×3): via INTRAVENOUS

## 2015-01-23 MED ORDER — FENTANYL CITRATE (PF) 100 MCG/2ML IJ SOLN
INTRAMUSCULAR | Status: AC
  Filled 2015-01-23: qty 8

## 2015-01-23 MED ORDER — BUPIVACAINE HCL (PF) 0.5 % IJ SOLN
INTRAMUSCULAR | Status: AC
  Filled 2015-01-23: qty 30

## 2015-01-23 MED ORDER — DEXAMETHASONE SODIUM PHOSPHATE 4 MG/ML IJ SOLN
INTRAMUSCULAR | Status: DC | PRN
  Administered 2015-01-23: 10 mg via INTRAVENOUS

## 2015-01-23 MED ORDER — SUCCINYLCHOLINE CHLORIDE 20 MG/ML IJ SOLN
INTRAMUSCULAR | Status: DC | PRN
  Administered 2015-01-23: 100 mg via INTRAVENOUS

## 2015-01-23 MED ORDER — MIDAZOLAM HCL 2 MG/2ML IJ SOLN
1.0000 mg | INTRAMUSCULAR | Status: DC | PRN
  Administered 2015-01-23: 1 mg via INTRAVENOUS
  Administered 2015-01-23 (×2): 2 mg via INTRAVENOUS

## 2015-01-23 MED ORDER — BUPIVACAINE-EPINEPHRINE (PF) 0.5% -1:200000 IJ SOLN
INTRAMUSCULAR | Status: AC
  Filled 2015-01-23: qty 30

## 2015-01-23 MED ORDER — HYDROMORPHONE HCL 1 MG/ML IJ SOLN
0.2500 mg | INTRAMUSCULAR | Status: DC | PRN
  Administered 2015-01-23 (×3): 0.5 mg via INTRAVENOUS

## 2015-01-23 MED ORDER — ONDANSETRON HCL 4 MG/2ML IJ SOLN
INTRAMUSCULAR | Status: DC | PRN
  Administered 2015-01-23: 4 mg via INTRAVENOUS

## 2015-01-23 MED ORDER — LIDOCAINE HCL (CARDIAC) 20 MG/ML IV SOLN
INTRAVENOUS | Status: DC | PRN
  Administered 2015-01-23: 60 mg via INTRAVENOUS

## 2015-01-23 MED ORDER — TECHNETIUM TC 99M SULFUR COLLOID FILTERED
1.0000 | Freq: Once | INTRAVENOUS | Status: AC | PRN
  Administered 2015-01-23: 1 via INTRADERMAL

## 2015-01-23 MED ORDER — CHLORHEXIDINE GLUCONATE 4 % EX LIQD: 1.0000 "application " | Freq: Once | CUTANEOUS | Status: DC

## 2015-01-23 MED ORDER — SODIUM CHLORIDE 0.9 % IJ SOLN: 3.0000 mL | INTRAMUSCULAR | Status: DC | PRN

## 2015-01-23 MED ORDER — BUPIVACAINE-EPINEPHRINE (PF) 0.25% -1:200000 IJ SOLN
INTRAMUSCULAR | Status: AC
  Filled 2015-01-23: qty 30

## 2015-01-23 MED ORDER — OXYCODONE HCL 5 MG PO TABS
ORAL_TABLET | ORAL | Status: AC
  Filled 2015-01-23: qty 1

## 2015-01-23 MED ORDER — CEFAZOLIN SODIUM-DEXTROSE 2-3 GM-% IV SOLR
2.0000 g | INTRAVENOUS | Status: AC
  Administered 2015-01-23: 2 g via INTRAVENOUS

## 2015-01-23 MED ORDER — SODIUM CHLORIDE 0.9 % IV SOLN: 250.0000 mL | INTRAVENOUS | Status: DC | PRN

## 2015-01-23 MED ORDER — EPHEDRINE SULFATE 50 MG/ML IJ SOLN
INTRAMUSCULAR | Status: DC | PRN
  Administered 2015-01-23: 5 mg via INTRAVENOUS
  Administered 2015-01-23: 10 mg via INTRAVENOUS
  Administered 2015-01-23: 5 mg via INTRAVENOUS

## 2015-01-23 MED ORDER — HEPARIN (PORCINE) IN NACL 2-0.9 UNIT/ML-% IJ SOLN
INTRAMUSCULAR | Status: AC
  Filled 2015-01-23: qty 500

## 2015-01-23 MED ORDER — ACETAMINOPHEN 325 MG PO TABS: 650.0000 mg | ORAL_TABLET | ORAL | Status: DC | PRN

## 2015-01-23 MED ORDER — BUPIVACAINE HCL (PF) 0.25 % IJ SOLN
INTRAMUSCULAR | Status: AC
  Filled 2015-01-23: qty 30

## 2015-01-23 SURGICAL SUPPLY — 81 items
ADH SKN CLS APL DERMABOND .7 (GAUZE/BANDAGES/DRESSINGS) ×2
APL SKNCLS STERI-STRIP NONHPOA (GAUZE/BANDAGES/DRESSINGS)
APPLIER CLIP 9.375 MED OPEN (MISCELLANEOUS) ×3
APR CLP MED 9.3 20 MLT OPN (MISCELLANEOUS) ×2
BAG DECANTER FOR FLEXI CONT (MISCELLANEOUS) ×3 IMPLANT
BENZOIN TINCTURE PRP APPL 2/3 (GAUZE/BANDAGES/DRESSINGS) IMPLANT
BINDER BREAST LRG (GAUZE/BANDAGES/DRESSINGS) ×1 IMPLANT
BINDER BREAST MEDIUM (GAUZE/BANDAGES/DRESSINGS) IMPLANT
BINDER BREAST XLRG (GAUZE/BANDAGES/DRESSINGS) IMPLANT
BINDER BREAST XXLRG (GAUZE/BANDAGES/DRESSINGS) IMPLANT
BLADE HEX COATED 2.75 (ELECTRODE) ×4 IMPLANT
BLADE SURG 10 STRL SS (BLADE) IMPLANT
BLADE SURG 15 STRL LF DISP TIS (BLADE) ×2 IMPLANT
BLADE SURG 15 STRL SS (BLADE) ×3
CANISTER SUC SOCK COL 7IN (MISCELLANEOUS) IMPLANT
CANISTER SUCT 1200ML W/VALVE (MISCELLANEOUS) ×3 IMPLANT
CHLORAPREP W/TINT 26ML (MISCELLANEOUS) ×4 IMPLANT
CLIP APPLIE 9.375 MED OPEN (MISCELLANEOUS) ×2 IMPLANT
COVER BACK TABLE 60X90IN (DRAPES) ×3 IMPLANT
COVER MAYO STAND STRL (DRAPES) ×4 IMPLANT
COVER PROBE 5X48 (MISCELLANEOUS) ×3
COVER PROBE W GEL 5X96 (DRAPES) ×3 IMPLANT
COVER SURGICAL LIGHT HANDLE (MISCELLANEOUS) ×1 IMPLANT
DECANTER SPIKE VIAL GLASS SM (MISCELLANEOUS) IMPLANT
DERMABOND ADVANCED (GAUZE/BANDAGES/DRESSINGS) ×1
DERMABOND ADVANCED .7 DNX12 (GAUZE/BANDAGES/DRESSINGS) ×2 IMPLANT
DEVICE DUBIN W/COMP PLATE 8390 (MISCELLANEOUS) ×3 IMPLANT
DRAPE C-ARM 42X72 X-RAY (DRAPES) ×4 IMPLANT
DRAPE LAPAROSCOPIC ABDOMINAL (DRAPES) ×3 IMPLANT
DRAPE UTILITY XL STRL (DRAPES) ×4 IMPLANT
DRSG PAD ABDOMINAL 8X10 ST (GAUZE/BANDAGES/DRESSINGS) IMPLANT
DRSG TEGADERM 2-3/8X2-3/4 SM (GAUZE/BANDAGES/DRESSINGS) IMPLANT
DRSG TEGADERM 4X10 (GAUZE/BANDAGES/DRESSINGS) IMPLANT
DRSG TEGADERM 4X4.75 (GAUZE/BANDAGES/DRESSINGS) IMPLANT
ELECT REM PT RETURN 9FT ADLT (ELECTROSURGICAL) ×3
ELECTRODE REM PT RTRN 9FT ADLT (ELECTROSURGICAL) ×2 IMPLANT
GAUZE SPONGE 4X4 12PLY STRL (GAUZE/BANDAGES/DRESSINGS) IMPLANT
GLOVE BIO SURGEON STRL SZ 6.5 (GLOVE) ×2 IMPLANT
GLOVE BIOGEL M STRL SZ7.5 (GLOVE) ×3 IMPLANT
GLOVE EUDERMIC 7 POWDERFREE (GLOVE) ×7 IMPLANT
GOWN STRL REUS W/ TWL LRG LVL3 (GOWN DISPOSABLE) ×4 IMPLANT
GOWN STRL REUS W/ TWL XL LVL3 (GOWN DISPOSABLE) ×2 IMPLANT
GOWN STRL REUS W/TWL LRG LVL3 (GOWN DISPOSABLE) ×6
GOWN STRL REUS W/TWL XL LVL3 (GOWN DISPOSABLE) ×6
IV CATH PLACEMENT UNIT 16 GA (IV SOLUTION) IMPLANT
IV KIT MINILOC 20X1 SAFETY (NEEDLE) IMPLANT
KIT CVR 48X5XPRB PLUP LF (MISCELLANEOUS) ×2 IMPLANT
KIT MARKER MARGIN INK (KITS) ×3 IMPLANT
KIT PORT POWER 8FR ISP CVUE (Catheter) ×3 IMPLANT
NDL BLUNT 17GA (NEEDLE) IMPLANT
NDL HYPO 25X1 1.5 SAFETY (NEEDLE) ×4 IMPLANT
NDL SAFETY ECLIPSE 18X1.5 (NEEDLE) ×2 IMPLANT
NEEDLE BLUNT 17GA (NEEDLE) IMPLANT
NEEDLE HYPO 18GX1.5 SHARP (NEEDLE) ×3
NEEDLE HYPO 22GX1.5 SAFETY (NEEDLE) ×3 IMPLANT
NEEDLE HYPO 25X1 1.5 SAFETY (NEEDLE) ×6 IMPLANT
NS IRRIG 1000ML POUR BTL (IV SOLUTION) ×3 IMPLANT
PACK BASIN DAY SURGERY FS (CUSTOM PROCEDURE TRAY) ×3 IMPLANT
PENCIL BUTTON HOLSTER BLD 10FT (ELECTRODE) ×4 IMPLANT
SET SHEATH INTRODUCER 10FR (MISCELLANEOUS) IMPLANT
SHEATH COOK PEEL AWAY SET 9F (SHEATH) IMPLANT
SHEET MEDIUM DRAPE 40X70 STRL (DRAPES) IMPLANT
SLEEVE SCD COMPRESS KNEE MED (MISCELLANEOUS) ×3 IMPLANT
SPONGE GAUZE 4X4 12PLY STER LF (GAUZE/BANDAGES/DRESSINGS) IMPLANT
SPONGE LAP 18X18 X RAY DECT (DISPOSABLE) IMPLANT
SPONGE LAP 4X18 X RAY DECT (DISPOSABLE) ×3 IMPLANT
STRIP CLOSURE SKIN 1/2X4 (GAUZE/BANDAGES/DRESSINGS) IMPLANT
SUT MNCRL AB 4-0 PS2 18 (SUTURE) ×3 IMPLANT
SUT PROLENE 2 0 CT2 30 (SUTURE) ×3 IMPLANT
SUT SILK 2 0 SH (SUTURE) ×3 IMPLANT
SUT VIC AB 2-0 CT1 27 (SUTURE)
SUT VIC AB 2-0 CT1 TAPERPNT 27 (SUTURE) IMPLANT
SUT VIC AB 3-0 SH 27 (SUTURE) ×3
SUT VIC AB 3-0 SH 27X BRD (SUTURE) IMPLANT
SUT VICRYL 3-0 CR8 SH (SUTURE) ×4 IMPLANT
SYR 5ML LUER SLIP (SYRINGE) ×3 IMPLANT
SYRINGE 10CC LL (SYRINGE) ×6 IMPLANT
TOWEL OR 17X24 6PK STRL BLUE (TOWEL DISPOSABLE) ×6 IMPLANT
TOWEL OR NON WOVEN STRL DISP B (DISPOSABLE) ×3 IMPLANT
TUBE CONNECTING 20X1/4 (TUBING) ×3 IMPLANT
YANKAUER SUCT BULB TIP NO VENT (SUCTIONS) ×3 IMPLANT

## 2015-01-23 NOTE — Op Note (Signed)
Patient Name:           Bonnie Ward   Date of Surgery:        01/23/2015  Pre op Diagnosis:      Invasive ductal carcinoma right breast, upper outer quadrant, triple positive tumor, clinical stage TIc, N0  Post op Diagnosis: Same     Procedure:                 Inject blue dye right breast                                      Right partial mastectomy with radioactive seed localization                                      Reexcision lateral margin                                      Reexcision superior margin                                      Right axillary sentinel node biopsy                                      Insertion of 8 French clearVue PowerPort tunneled venous vascular access device                                      Use of fluoroscopy for guidance and positioning  Surgeon:                     Edsel Petrin. Dalbert Batman, M.D., FACS  Assistant:                      OR staff  Operative Indications:    This is a very pleasant 54 year old Caucasian female, referred by Dr. Peggye Fothergill at the breast center of [facility] for evaluation and management of invasive dtuctal carcinoma right breast, upper outer quadrant, Triple positive tumor. . her PCP is Dr. Shanon Ace and Dr. Adriana Simas. She was  seen in the [facility] recently by Dr. Jana Hakim and Dr. Pablo Ledger and me she is divorced. Has no children.  . recent screening mammograms show a 1.7 cm. mass in the right breast at the 10:00 position, 6 cm from the nipple, this looks very posterior and lateral. No adenopathy. Biopsy shows a grade 1-2 invasive ductal carcinoma. Breast diagnostic profile  Shows TPBC, Her-2 positive.   no prior breast problems. Comorbidities include that she is a former smoker, GERD, mild irritable bowel syndrome, arachnoid cyst, mild glaucoma.  No family history of breast or ovarian or prostate cancer.. Rare alcohol use. She quit tobacco use in 2016. she is strongly motivated for breast conservation,  and I think she is a good candidate for that. She will be scheduled for right partial mastectomy with radioactive seed localization and right axillary sentinel node biopsy. Dr. Randell Patient not request Port-A-Cath insertion and we  have discussed that as well.  Operative Findings:       The radioactive seed was audible with the neoprobe in the holding area.  The posterior margin includes the pectoralis fascia, so the posterior margin is the pectoralis muscle.  I felt that I might be close laterally and possibly superiorly so after removing and x-raying  the specimen I reexcised the lateral margin and the superior margin and sent them in separate specimens.  I found 3 sentinel lymph nodes.  They did not feel or look grossly abnormal.  The port was inserted through the left subclavian vein and appears to be well positioned under fluoroscopy in the operating room.  Chest x-ray is pending.  Procedure in Detail:          The radioactive seed was placed in the upper outer quadrant of the right breast by the radiologist recently.  I was able to hear the seed  with the neoprobe in the holding area.  The patient underwent a pectoral block by Dr. Al Corpus.  Radionuclide was injected into the right breast by the nuclear medicine technician in the holding area.  The patient was then taken to the operating room and underwent general anesthesia with an LMA  device.  Surgical timeout was performed.  Antibiotic's were given.  Following alcohol prep I injected 5 mL of blue dye into the right breast, subareolar area.  This is methylene blue mixed with saline and the breast was massaged for a few minutes.  The entire right breast and right axilla were prepped and draped in a sterile fashion.     0.5% Marcaine with epinephrine was used as local infiltration anesthesia.  Identified the area of maximum radioactivity in the right breast upper outer quadrant.  This was about 3 cm lateral to the areolar margin.  A curvilinear incision was made.   I dissected down into the breast tissue using the neoprobe as a guide.  The tumor was fairly posterior.  I removed the specimen and marked with silk sutures and a 6 color ink.  Specimen mammogram looked good but the tumor looked off to one side.  I felt that I was probably close lateral and possibly superior and so I reexcised the lateral and superior margins and marked these with ink and sent them as separate specimens.    When I listened in the axilla for the sentinel nodes they were very high and far away from the lumpectomy incision so I had to make a separate incision at the hairline.  I dissected down through the clavipectoral fascia into the axilla and found 3 sentinel lymph nodes that were very hot and blue.  These were sent as separate specimens.  Hemostasis was excellent in both wounds and achieved with electrocautery.  They were both irrigated.  The lumpectomy wound was marked with metal clips.  The deeper layers of both wounds were closed with interrupted sutures of 3-0 Vicryl and the skin incisions were closed with running subcuticular 4-0 Monocryl and Dermabond.    The patient was repositioned with a small roll behind her shoulders and her arms tucked at her sides.  The neck and chest were prepped and draped in a can.  Another surgical timeout was performed.  A left subclavian venipuncture was performed with good blood return and I threaded the wire into the vena cava under fluoroscopic guidance.  A small incision was made at the wire insertion site.  Using the C-arm as a guide I drew a template  on the chest wall to guide insertion and positioning of the catheter so that the catheter tip would be in the vena cava near the right atrium.  I made a transverse incision below the left clavicle and created a subcutaneous cutaneous pocket at the level of the pectoralis fascia.  Using a tunneling device passed the catheter from the port pocket site to the wire insertion site.  Using the template on the  chest wall I measured the length of the catheter and cut it 21 cm in length.  The catheter was then secured to the port with the locking device and everything was flushed with heparinized saline.  I placed the port in the pocket.  I inserted the dilator and peel-away sheath over the guidewire without difficulty and removed the wire and dilator.  I threaded the catheter through the peel-away sheath and removed the peel-away sheath.  I had excellent blood return and it flushed easily.  Fluoroscopy confirmed that the catheter was well-positioned with its tip in this.  Vena cava and no deformity anywhere along its course.  I flushed the port and the catheter with concentrated heparin.  The port was sutured to the pectoralis fascia with 3 interrupted sutures of 20 proline.  The 60s tissue was closed with 3 oblique and the skin was closed with running 4-0 Monocryl subcuticular suture and Dermabond.     The patient tolerated all these procedures well and was taken to PACU in stable condition.  EBL 25 mL.  Counts correct.  Complications none.  A portable chest x-ray is planned.     Edsel Petrin. Dalbert Batman, M.D., FACS General and Minimally Invasive Surgery Breast and Colorectal Surgery  01/23/2015 2:41 PM

## 2015-01-23 NOTE — Anesthesia Procedure Notes (Addendum)
Anesthesia Regional Block:  Pectoralis block  Pre-Anesthetic Checklist: ,, timeout performed, Correct Patient, Correct Site, Correct Laterality, Correct Procedure, Correct Position, site marked, Risks and benefits discussed,  Surgical consent,  Pre-op evaluation,  At surgeon's request and post-op pain management  Laterality: Right  Prep: chloraprep       Needles:  Injection technique: Single-shot  Needle Type: Echogenic Needle     Needle Length: 9cm 9 cm Needle Gauge: 21 and 21 G    Additional Needles:  Procedures: ultrasound guided (picture in chart) Pectoralis block Narrative:  Start time: 01/23/2015 11:48 AM End time: 01/23/2015 11:54 AM Injection made incrementally with aspirations every 5 mL. Anesthesiologist: HODIERNE, ADAM  Additional Notes: Pt tolerated the procedure well.   Procedure Name: Intubation Date/Time: 01/23/2015 12:34 PM Performed by: Baxter Flattery Pre-anesthesia Checklist: Patient identified, Emergency Drugs available, Suction available and Patient being monitored Patient Re-evaluated:Patient Re-evaluated prior to inductionOxygen Delivery Method: Circle System Utilized Preoxygenation: Pre-oxygenation with 100% oxygen Intubation Type: IV induction Ventilation: Mask ventilation without difficulty Laryngoscope Size: Miller and 2 Grade View: Grade II Tube size: 7.0 mm Number of attempts: 1 Airway Equipment and Method: Bite block and Stylet Placement Confirmation: positive ETCO2 and breath sounds checked- equal and bilateral Secured at: 22 cm Tube secured with: Tape Dental Injury: Teeth and Oropharynx as per pre-operative assessment

## 2015-01-23 NOTE — Progress Notes (Signed)
  Assisted Dr. Hodierne with right, ultrasound guided, pectoralis block. Side rails up, monitors on throughout procedure. See vital signs in flow sheet. Tolerated Procedure well. 

## 2015-01-23 NOTE — Anesthesia Preprocedure Evaluation (Signed)
Anesthesia Evaluation  Patient identified by MRN, date of birth, ID band Patient awake    Reviewed: Allergy & Precautions, NPO status , Patient's Chart, lab work & pertinent test results  Airway Mallampati: II   Neck ROM: full    Dental   Pulmonary former smoker,  breath sounds clear to auscultation        Cardiovascular negative cardio ROS  Rhythm:regular Rate:Normal     Neuro/Psych  Headaches, Anxiety    GI/Hepatic GERD-  ,  Endo/Other  Hypothyroidism   Renal/GU      Musculoskeletal  (+) Arthritis -,   Abdominal   Peds  Hematology   Anesthesia Other Findings   Reproductive/Obstetrics Breast CA                             Anesthesia Physical Anesthesia Plan  ASA: II  Anesthesia Plan: General and Regional   Post-op Pain Management: MAC Combined w/ Regional for Post-op pain   Induction: Intravenous  Airway Management Planned: Oral ETT  Additional Equipment:   Intra-op Plan:   Post-operative Plan: Extubation in OR  Informed Consent: I have reviewed the patients History and Physical, chart, labs and discussed the procedure including the risks, benefits and alternatives for the proposed anesthesia with the patient or authorized representative who has indicated his/her understanding and acceptance.     Plan Discussed with: CRNA, Anesthesiologist and Surgeon  Anesthesia Plan Comments:         Anesthesia Quick Evaluation

## 2015-01-23 NOTE — Discharge Instructions (Signed)
° °Post Anesthesia Home Care Instructions ° °Activity: °Get plenty of rest for the remainder of the day. A responsible adult should stay with you for 24 hours following the procedure.  °For the next 24 hours, DO NOT: °-Drive a car °-Operate machinery °-Drink alcoholic beverages °-Take any medication unless instructed by your physician °-Make any legal decisions or sign important papers. ° °Meals: °Start with liquid foods such as gelatin or soup. Progress to regular foods as tolerated. Avoid greasy, spicy, heavy foods. If nausea and/or vomiting occur, drink only clear liquids until the nausea and/or vomiting subsides. Call your physician if vomiting continues. ° °Special Instructions/Symptoms: °Your throat may feel dry or sore from the anesthesia or the breathing tube placed in your throat during surgery. If this causes discomfort, gargle with warm salt water. The discomfort should disappear within 24 hours. ° °If you had a scopolamine patch placed behind your ear for the management of post- operative nausea and/or vomiting: ° °1. The medication in the patch is effective for 72 hours, after which it should be removed.  Wrap patch in a tissue and discard in the trash. Wash hands thoroughly with soap and water. °2. You may remove the patch earlier than 72 hours if you experience unpleasant side effects which may include dry mouth, dizziness or visual disturbances. °3. Avoid touching the patch. Wash your hands with soap and water after contact with the patch. °  ° ° °[facility] °Office Phone Number [phone] ° °BREAST BIOPSY/ PARTIAL MASTECTOMY: POST OP INSTRUCTIONS ° °Always review your discharge instruction sheet given to you by the facility where your surgery was performed. ° °IF YOU HAVE DISABILITY OR FAMILY LEAVE FORMS, YOU MUST BRING THEM TO THE OFFICE FOR PROCESSING.  DO NOT GIVE THEM TO YOUR DOCTOR. ° °1. A prescription for pain medication may be given to you upon discharge.  Take your pain  medication as prescribed, if needed.  If narcotic pain medicine is not needed, then you may take acetaminophen (Tylenol) or ibuprofen (Advil) as needed. °2. Take your usually prescribed medications unless otherwise directed °3. If you need a refill on your pain medication, please contact your pharmacy.  They will contact our office to request authorization.  Prescriptions will not be filled after 5pm or on week-ends. °4. You should eat very light the first 24 hours after surgery, such as soup, crackers, pudding, etc.  Resume your normal diet the day after surgery. °5. Most patients will experience some swelling and bruising in the breast.  Ice packs and a good support bra will help.  Swelling and bruising can take several days to resolve.  °6. It is common to experience some constipation if taking pain medication after surgery.  Increasing fluid intake and taking a stool softener will usually help or prevent this problem from occurring.  A mild laxative (Milk of Magnesia or Miralax) should be taken according to package directions if there are no bowel movements after 48 hours. °7. Unless discharge instructions indicate otherwise, you may remove your bandages 24-48 hours after surgery, and you may shower at that time.  You may have steri-strips (small skin tapes) in place directly over the incision.  These strips should be left on the skin for 7-10 days.  If your surgeon used skin glue on the incision, you may shower in 24 hours.  The glue will flake off over the next 2-3 weeks.  Any sutures or staples will be removed at the office during your follow-up visit. °8.   ACTIVITIES:  You may resume regular daily activities (gradually increasing) beginning the next day.  Wearing a good support bra or sports bra minimizes pain and swelling.  You may have sexual intercourse when it is comfortable. °a. You may drive when you no longer are taking prescription pain medication, you can comfortably wear a seatbelt, and you can  safely maneuver your car and apply brakes. °b. RETURN TO WORK:  ______________________________________________________________________________________ °9. You should see your doctor in the office for a follow-up appointment approximately two weeks after your surgery.  Your doctor’s nurse will typically make your follow-up appointment when she calls you with your pathology report.  Expect your pathology report 2-3 business days after your surgery.  You may call to check if you do not hear from us after three days. °10. OTHER INSTRUCTIONS: _______________________________________________________________________________________________ _____________________________________________________________________________________________________________________________________ °_____________________________________________________________________________________________________________________________________ °_____________________________________________________________________________________________________________________________________ ° °WHEN TO CALL YOUR DOCTOR: °1. Fever over 101.0 °2. Nausea and/or vomiting. °3. Extreme swelling or bruising. °4. Continued bleeding from incision. °5. Increased pain, redness, or drainage from the incision. ° °The clinic staff is available to answer your questions during regular business hours.  Please don’t hesitate to call and ask to speak to one of the nurses for clinical concerns.  If you have a medical emergency, go to the nearest emergency room or call 911.  A surgeon from [facility] is always on call at the hospital. ° °For further questions, please visit [email or web address]  °

## 2015-01-23 NOTE — Anesthesia Postprocedure Evaluation (Signed)
  Anesthesia Post-op Note  Patient: Bonnie Ward  Procedure(s) Performed: Procedure(s): RIGHT PARTIAL MASTECTOMY WITH RADIOACTIVE SEED LOCALIZATION  WITH RIGHT AXILLARY SENTINEL LYMPH NODE BIOPSY (Right) INSERTION PORT-A-CATH  (Left)  Patient Location: PACU  Anesthesia Type:GA combined with regional for post-op pain  Level of Consciousness: awake and alert   Airway and Oxygen Therapy: Patient Spontanous Breathing  Post-op Pain: mild  Post-op Assessment: Post-op Vital signs reviewed  Post-op Vital Signs: Reviewed  Last Vitals:  Filed Vitals:   01/23/15 1545  BP: 136/75  Pulse: 109  Temp:   Resp: 17    Complications: No apparent anesthesia complications

## 2015-01-23 NOTE — Transfer of Care (Signed)
Immediate Anesthesia Transfer of Care Note  Patient: Bonnie Ward  Procedure(s) Performed: Procedure(s): RIGHT PARTIAL MASTECTOMY WITH RADIOACTIVE SEED LOCALIZATION  WITH RIGHT AXILLARY SENTINEL LYMPH NODE BIOPSY (Right) INSERTION PORT-A-CATH WITH ULTRASOUND (Left)  Patient Location: PACU  Anesthesia Type:GA combined with regional for post-op pain  Level of Consciousness: awake, sedated and responds to stimulation  Airway & Oxygen Therapy: Patient Spontanous Breathing and Patient connected to face mask oxygen  Post-op Assessment: Report given to RN, Post -op Vital signs reviewed and stable and Patient moving all extremities  Post vital signs: Reviewed and stable  Last Vitals:  Filed Vitals:   01/23/15 1449  BP:   Pulse: 100  Temp:   Resp: 18    Complications: No apparent anesthesia complications

## 2015-01-23 NOTE — Interval H&P Note (Signed)
History and Physical Interval Note:  01/23/2015 11:56 AM  Bonnie Ward  has presented today for surgery, with the diagnosis of cancer right breast  The various methods of treatment have been discussed with the patient and family. After consideration of risks, benefits and other options for treatment, the patient has consented to  Procedure(s): RIGHT PARTIAL MASTECTOMY WITH RADIOACTIVE SEED LOCALIZATION  WITH RIGHT AXILLARY SENTINEL LYMPH NODE BIOPSY (Right) INSERTION PORT-A-CATH WITH ULTRASOUND (N/A) as a surgical intervention .  The patient's history has been reviewed, patient examined, no change in status, stable for surgery.  I have reviewed the patient's chart and labs.  Questions were answered to the patient's satisfaction.     [name]

## 2015-01-26 ENCOUNTER — Encounter (HOSPITAL_BASED_OUTPATIENT_CLINIC_OR_DEPARTMENT_OTHER): Payer: Self-pay | Admitting: General Surgery

## 2015-01-29 ENCOUNTER — Other Ambulatory Visit: Payer: Self-pay | Admitting: General Surgery

## 2015-01-29 ENCOUNTER — Telehealth: Payer: Self-pay

## 2015-01-29 NOTE — Telephone Encounter (Signed)
Office Notes dtd 01/29/15 rcvd from [facility].  Reviewed by Dr. Jana Hakim.  Sent to scan.

## 2015-02-03 ENCOUNTER — Ambulatory Visit (HOSPITAL_BASED_OUTPATIENT_CLINIC_OR_DEPARTMENT_OTHER)
Admission: RE | Admit: 2015-02-03 | Discharge: 2015-02-03 | Disposition: A | Discharge status: Home / Self Care | Payer: BLUE CROSS/BLUE SHIELD | Source: Ambulatory Visit | Attending: Internal Medicine | Admitting: Internal Medicine

## 2015-02-03 ENCOUNTER — Encounter (HOSPITAL_COMMUNITY): Payer: Self-pay

## 2015-02-03 ENCOUNTER — Other Ambulatory Visit: Payer: BLUE CROSS/BLUE SHIELD

## 2015-02-03 ENCOUNTER — Ambulatory Visit (HOSPITAL_COMMUNITY)
Admission: RE | Admit: 2015-02-03 | Discharge: 2015-02-03 | Disposition: A | Discharge status: Home / Self Care | Payer: BLUE CROSS/BLUE SHIELD | Source: Ambulatory Visit | Attending: Internal Medicine | Admitting: Internal Medicine

## 2015-02-03 ENCOUNTER — Encounter: Payer: Self-pay | Admitting: *Deleted

## 2015-02-03 VITALS — BP 114/78 | HR 84 | Wt 146.1 lb

## 2015-02-03 DIAGNOSIS — C50411 Malignant neoplasm of upper-outer quadrant of right female breast: Secondary | ICD-10-CM | POA: Diagnosis not present

## 2015-02-03 DIAGNOSIS — C50919 Malignant neoplasm of unspecified site of unspecified female breast: Secondary | ICD-10-CM | POA: Insufficient documentation

## 2015-02-03 NOTE — Progress Notes (Addendum)
Patient ID: Bonnie Ward, female   DOB: 1961/08/04, 54 y.o.   MRN: [id number]  Oncologist: Dr. Jana Hakim  54 yo with recently-diagnosed triple receptor positive breast cancer presents for cardio-oncology evaluation.  Cancer was diagnosed in 5/16.  She is s/p right partial mastectomy.  Cancer was ER+/PR+/HER2+, so planned for chemotherapy including Herceptin.  This has not yet been started.  She has no history of cardiac problems.  She has good exercise tolerance and does Pilates, spin, and TRX with no exertional dyspnea or chest pain.  No palpitations or lightheadedness.  No orthopnea/PND.    I reviewed today's echo.  EF was normal with normal strain and lateral s' patterns.   PMH: 1. GERD 2. IBS 3. Recurrent UTIs 4. Breast cancer: ER+/PR+/HER2+.  Diagnosed 5/16, had partial right mastectomy in 6/16.  - Echo (6/16) with EF 55-60%, normal RV, lateral s' 14 cm/sec, GLS -23.3%.   SH: Quit smoking in 2016, works as Haematologist, rare ETOH.   FH: Uncle with MI in his 78s.  ROS: All systems reviewed and negative except as per HPI.   Current Outpatient Prescriptions  Medication Sig Dispense Refill  . acetaminophen (TYLENOL) 325 MG tablet Take 650 mg by mouth every 6 (six) hours as needed.    . ALPRAZolam (XANAX) 0.25 MG tablet Take 0.25 mg by mouth at bedtime as needed for anxiety. 1/2 tab. daily    . bimatoprost (LUMIGAN) 0.03 % ophthalmic solution Place 1 drop into both eyes at bedtime.    Marland Kitchen HYDROcodone-acetaminophen (NORCO) 5-325 MG per tablet Take 1-2 tablets by mouth every 6 (six) hours as needed for moderate pain or severe pain. 30 tablet 0  . liothyronine (CYTOMEL) 5 MCG tablet Take 5 mcg by mouth 2 (two) times daily.    . SUMAtriptan (IMITREX) 50 MG tablet TAKE 1 TABLET BY MOUTH AT THE ONSET OF HEADACHE. TAKE NO MORE THAN 4 TABLETS BY MOUTH IN 24 HOURS 9 tablet 5   No current facility-administered medications for this encounter.   BP 114/78 mmHg  Pulse 84  Wt 146 lb 1.9 oz (66.28 kg)   SpO2 95%  LMP 01/25/2011 General: NAD Neck: No JVD, no thyromegaly or thyroid nodule.  Lungs: Clear to auscultation bilaterally with normal respiratory effort. CV: Nondisplaced PMI.  Heart regular S1/S2, no S3/S4, no murmur.  No peripheral edema.  No carotid bruit.  Normal pedal pulses.  Abdomen: Soft, nontender, no hepatosplenomegaly, no distention.  Skin: Intact without lesions or rashes. Port right upper chest.  Neurologic: Alert and oriented x 3.  Psych: Normal affect. Extremities: No clubbing or cyanosis.  HEENT: Normal.   Assessment/Plan: 54 yo with triple positive breast cancer s/p partial mastectomy now planned for chemotherapy including Herceptin.  Baseline echo was reviewed today and appears normal.  I reviewed with her the cardiotoxicity risk with Herceptin (about 10% see a significant fall in EF) and steps that we would take if she were to develop cardiotoxicity.  We will monitor her every 3 months with an echo while on Herceptin.  Next visit with echo was scheduled.   Loralie Champagne 02/03/2015

## 2015-02-03 NOTE — Patient Instructions (Signed)
We will contact you in 3 months to schedule your next appointment and echocardiogram  

## 2015-02-03 NOTE — Progress Notes (Signed)
  Echocardiogram 2D Echocardiogram has been performed.  Bonnie Ward 02/03/2015, 10:59 AM

## 2015-02-04 ENCOUNTER — Encounter: Payer: Self-pay | Admitting: *Deleted

## 2015-02-04 NOTE — Progress Notes (Signed)
Meet with patient last pm for chemo education.  States having surgery again for clear margins on 6/27.  Will need to cancel her app't to see Dr. Jana Hakim that day.   Patient on vacation from 6/16 to 6/22 in [place].  Chemo start date still to be scheduled.  Will let Dr. Virgie Dad nurse know of above.

## 2015-02-05 ENCOUNTER — Other Ambulatory Visit: Payer: Self-pay | Admitting: *Deleted

## 2015-02-09 ENCOUNTER — Ambulatory Visit: Payer: Self-pay | Admitting: Oncology

## 2015-02-09 ENCOUNTER — Other Ambulatory Visit: Payer: Self-pay | Admitting: Oncology

## 2015-02-12 ENCOUNTER — Encounter (HOSPITAL_BASED_OUTPATIENT_CLINIC_OR_DEPARTMENT_OTHER): Payer: Self-pay | Admitting: *Deleted

## 2015-02-13 NOTE — H&P (Signed)
Bonnie Ward 01/29/2015 8:01 AM Location: [facility] Patient #: [id number] DOB: 02-17-1961 Single / Language: Bonnie Ward / Race: White Female  History of Present Illness    The patient is a 54 year old female who presents with breast cancer. This patient returns for a postop visit and to discuss her pathology and the close margins. On January 23, 2015 she underwent right partial mastectomy seed localization, reexcision of lateral and superior margins, right axillary sentinel node biopsy, and port insertion for her triple-positive breast cancer. She still a little sore and a little swollen but doing fine. 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 Her posterior margin is close, but we took the dissection all the way down to the pectoralis muscle and so no further excision is warranted there. The tumor was posterior, however. Interestingly, the anterior margin is very close, less than 0.01 cm. I discussed this with Dr. Lyndon Ward and Dr. Tillman Ward and they say that there are microscopic nests of invasive cancer that are nonpalpable anteriorly, away from the primary mass. T1c, N1a. I have advised that this area be reexcised. I told her that most likely this would render a negative margin. I told her there was the possibility of multifocal disease and a small chance that she might wind up with a mastectomy, but hopefully not. MRI would not be very diagnostic most likely  because of the postop surgical changes. She is going to need chemotherapy regardless because she is triple-positive and she understands that. She is going to [place] next week and I told her we could do this the following week. I offered her an opinion to discuss this with Dr. Pablo Ward or Dr. Jana Ward but she was okay with going ahead with surgery. We talked about the techniques of reexcision and what I was trying to accomplish. She is aware of the risks, including bleeding,  infection, skin necrosis, nerve damage, and cosmetic deformity. She understands all these issues. All of her questions are answered. She agrees with this plan.   Allergies  Milk Digestant *DIGESTIVE AIDS*  Medication History Liothyronine Sodium (5MCG Tablet, Oral) Active. Xanax (0.25MG  Tablet, Oral as needed) Active. Lumigan (0.03% Solution, Ophthalmic as needed) Active. SUMAtriptan Succinate (50MG  Tablet, Oral) Active. Lumigan (0.01% Solution, Ophthalmic) Active. Medications Reconciled  Vitals   Weight: 146 lb Height: 66in Body Surface Area: 1.76 m Body Mass Index: 23.56 kg/m Pulse: 113 (Regular)  BP: 146/82 (Sitting, Left Arm, Standard)    Physical Exam  General Note: Pleasant. Alert. No distress. Mother is with her throughout the encounter   Chest and Lung Exam Note: Lungs clear. Port site left infraclavicular area looks fine.   Breast Note: Right breast incision laterally and right axillary incisions look good. A little bit of ecchymoses but only minimal swelling. No signs of infection or drainage. No arm swelling. No arm numbness or sensory deficit.     Assessment & Plan  PRIMARY CANCER OF UPPER OUTER QUADRANT OF RIGHT FEMALE BREAST (174.4  C50.411)   Schedule for Surgery You are recovering from your right lumpectomy, sentinel node biopsy and Port-A-Cath insertion without any obvious surgical complications. The wounds look fine. We discussed her pathology report in detail. You have one out of 3 positive sentinel nodes. You have a close posterior margin but we took the dissection all the way to the muscle so there is no real reason to do any further surgery posteriorly. You have some microscopic nests of cancer very very  close to the anterior margin, and this area will need to be reexcised. This needs to be done to assess the extent of her tumor and to be sure that everything has been removed. There is a small chance that you might have more  cancer then we thought, but most likely we will be done after the reexcision. We discussed the techniques and risk of the surgery in detail. You were offered a second opinion. Youi will be scheduled for right lumpectomy with reexcision of the anterior margin in the near future. FORMER SMOKER (V15.82  Z87.891) ARACHNOID CYST (348.0  G93.0) CHRONIC GERD (530.81  K21.9)   Bonnie Ward M. Bonnie Ward, M.D., [facility] General and Minimally invasive Surgery Breast and Colorectal Surgery Office:   [phone] Pager:   [phone]

## 2015-02-14 NOTE — Progress Notes (Signed)
[place]  [phone] [phone]) [phone]     ID: Bonnie Ward DOB: Oct 25, 1960  MR#: [id number]  YSA#:[id number]  Patient Care Team: Lollie Sails, MD as PCP - General (Internal Medicine) Milford Cage, OTR Kem Boroughs, FNP as Nurse Practitioner (Nurse Practitioner) Fanny Skates, MD as Consulting Physician (General Surgery) Chauncey Cruel, MD as Consulting Physician (Oncology) Thea Silversmith, MD as Consulting Physician (Radiation Oncology) Rockwell Germany, RN as Registered Nurse Mauro Kaufmann, RN as Registered Nurse Holley Bouche, NP as Nurse Practitioner (Nurse Practitioner) Kristeen Miss, MD as Consulting Physician (Neurosurgery) PCP: Lollie Sails, MD OTHER MD:  CHIEF COMPLAINT: Estrogen receptor positive breast cancer  CURRENT TREATMENT: adjuvant chemotherapy   BREAST CANCER HISTORY: From the initial intake note:   Bonnie Ward had routine bilateral screening mammography at the [facility] for 20 01/09/2015 showing a possible mass in the right breast. On 12/22/2014 she underwent diagnostic right mammography with tomosynthesis and right breast ultrasonography. The breast density was category C. In the right breast there was an area of increased density with architectural distortion and faint microcalcifications in the upper outer quadrant. There was mild palpable soft tissue thickening in the area in question, but no palpable right axillary lymph nodes. Ultrasound confirmed a 1.7 cm irregular hypoechoic mass. There were no abnormal-appearing right axillary lymph nodes.  Biopsy of the right breast mass in question 12/26/2014 showed ([facility] 16-08/08/2005) an invasive ductal carcinoma, grade 1 or 2, with a prognostic panel still pending.  Her subsequent history is as detailed below  INTERVAL HISTORY: Bonnie Ward returns today for follow-up of her breast cancer accompanied by       . After her last visit here, the prognostic panel from her  initial biopsy showed her tumor to be triple positive, with 100% estrogen and 90% progesterone receptor positivity, both with strong staining intensity, with an MIB-1 of 11%, and HER-2 amplified, with a signals ratio of 2.07 and the number per cell 2.28. She then proceeded to right lumpectomy and sentinel lymph node sampling 01/23/2015. This showed an invasive ductal carcinoma, grade 1, measuring 1.6 cm. 1 of 3 sentinel lymph nodes sampled had a macro metastatic deposit. There was no extracapsular extension. Margins were very close posteriorly and anteriorly.   The patient's case was presented at the multidisciplinary breast cancer conference 02/04/2015. At that time the margin question was discussed. The posterior margin is at fascia. The other margin will be reexcised at the time of port placement. Since the patient's axilla was clinically negative initially there will be no axillary dissection. She will then follow with adjuvant chemotherapy and then radiation and antiestrogen is as appropriate.    was evaluated in the multidisciplinary breast cancer clinic 01/07/2015, accompanied by her friend Bonnie Ward. Her case was also presented at the multidisciplinary breast cancer conference that same morning. At that time a tentative plan was suggested namely breast conservation surgery with sentinel lymph node sampling, followed by Oncotype if the lymph node is negative, then adjuvant radiation followed by anti-estrogens if the tumor is estrogen receptor positive. On the other hand if the tumor is triple negative or HER-2 positive the patient would require chemotherapy.  REVIEW OF SYSTEMS: There were no specific symptoms leading to the original mammogram, which was routinely scheduled. The patient denies unusual headaches, visual changes, nausea, vomiting, stiff neck, dizziness, or gait imbalance. There has been no cough, phlegm production, or pleurisy, no chest pain or pressure, and no change in bowel or  bladder habits. The  patient denies fever, rash, bleeding, unexplained fatigue or unexplained weight loss. A detailed review of systems was otherwise entirely negative.  PAST MEDICAL HISTORY: Past Medical History  Diagnosis Date  . ACID REFLUX DISEASE 07/12/2010  . Arachnoid cyst     left frontal lobe  . Migraines   . Arthritis     hands  . Nervous stomach   . Difficulty swallowing pills   . Hypothyroidism   . Stress fracture of foot 12/2014    right  . Glaucoma   . Breast cancer 12/2014    right  . Anxiety   . Dental crowns present   . Seasonal allergies   . Complication of anesthesia 2012    states woke up during colonoscopy with Propofol    PAST SURGICAL HISTORY: Past Surgical History  Procedure Laterality Date  . Tonsillectomy  age 54  . Esophagogastroduodenoscopy endoscopy    . Tooth extraction    . Lasik    . Colonoscopy with propofol  10/14/2013  . Radioactive seed guided mastectomy with axillary sentinel lymph node biopsy Right 01/23/2015    Procedure: RIGHT PARTIAL MASTECTOMY WITH RADIOACTIVE SEED LOCALIZATION  WITH RIGHT AXILLARY SENTINEL LYMPH NODE BIOPSY;  Surgeon: Fanny Skates, MD;  Location: [facility];  Service: General;  Laterality: Right;  . Portacath placement Left 01/23/2015    Procedure: INSERTION PORT-A-CATH ;  Surgeon: Fanny Skates, MD;  Location: [facility];  Service: General;  Laterality: Left;    FAMILY HISTORY Family History  Problem Relation Age of Onset  . Osteoporosis Mother   . Irritable bowel syndrome Mother   . Hypertension Mother   . Hypertension Father   . Hyperlipidemia Father   . Diabetes Father   . Dementia Father   . Colon cancer Maternal Grandfather 41  . Breast cancer Paternal Aunt   . Breast cancer Paternal Grandmother    the patient's father died at age 42 from pneumonia. [name] died at age 48 after bowel perforation. The patient had one brother, one sister. There is no history of  breast or ovarian cancer in the family.  GYNECOLOGIC HISTORY:  Patient's last menstrual period was 01/25/2011. Menarche age 88, the patient is GX P0. She stopped having periods approximately 2011. She is still on hormone replacement, but is "trickling off it". She did take birth control pills for approximately 3 years remotely with no complications  SOCIAL HISTORY:  Lavra works as a Haematologist. She is divorced and lives by herself, with 2 dogs.    ADVANCED DIRECTIVES: The patient's brother [name] is her healthcare power of attorney. He may be reached in [place] at [place]: History  Substance Use Topics  . Smoking status: Former Smoker -- 0.00 packs/day for 0 years    Quit date: 02/19/2005  . Smokeless tobacco: Never Used  . Alcohol Use: Yes     Comment: occasionally     Colonoscopy: February 2016  PAP: June 2015  Bone density: October 2015  Lipid panel:  Allergies  Allergen Reactions  . Lortab [Hydrocodone-Acetaminophen] Anxiety  . Milk-Related Compounds Diarrhea    GI UPSET    Current Outpatient Prescriptions  Medication Sig Dispense Refill  . acetaminophen (TYLENOL) 325 MG tablet Take 650 mg by mouth every 6 (six) hours as needed.    . ALPRAZolam (XANAX) 0.25 MG tablet Take 0.25 mg by mouth at bedtime as needed for anxiety. 1/2 tab. daily    . bimatoprost (LUMIGAN) 0.03 % ophthalmic  solution Place 1 drop into both eyes at bedtime.    Marland Kitchen HYDROcodone-acetaminophen (NORCO) 5-325 MG per tablet Take 1-2 tablets by mouth every 6 (six) hours as needed for moderate pain or severe pain. 30 tablet 0  . liothyronine (CYTOMEL) 5 MCG tablet Take 5 mcg by mouth 2 (two) times daily.    . SUMAtriptan (IMITREX) 50 MG tablet TAKE 1 TABLET BY MOUTH AT THE ONSET OF HEADACHE. TAKE NO MORE THAN 4 TABLETS BY MOUTH IN 24 HOURS 9 tablet 5   No current facility-administered medications for this visit.    OBJECTIVE: Middle-aged white woman in no acute  distress There were no vitals filed for this visit.   There is no weight on file to calculate BMI.    ECOG FS:0 - Asymptomatic  Ocular: Sclerae unicteric, pupils round and equal Ear-nose-throat: Oropharynx clear and moist Lymphatic: No cervical or supraclavicular adenopathy Lungs no rales or rhonchi, good excursion bilaterally Heart regular rate and rhythm, no murmur appreciated Abd soft, nontender, positive bowel sounds MSK no focal spinal tenderness, no joint edema Neuro: non-focal, well-oriented, appropriate affect Breasts: The right breast is status post recent biopsy. I do not palpate a well-defined mass. There is no skin or nipple change of concern. The right axilla is benign per the left breast is unremarkable.   LAB RESULTS:  CMP     Component Value Date/Time   NA 139 01/07/2015 0818   NA 140 01/21/2009 0828   K 4.6 01/07/2015 0818   K 4.1 01/21/2009 0828   CL 108 01/21/2009 0828   CO2 26 01/07/2015 0818   CO2 30 01/21/2009 0828   GLUCOSE 78 01/07/2015 0818   GLUCOSE 90 01/21/2009 0828   BUN 15.4 01/07/2015 0818   BUN 13 01/21/2009 0828   CREATININE 0.8 01/07/2015 0818   CREATININE 0.7 01/21/2009 0828   CALCIUM 8.8 01/07/2015 0818   CALCIUM 8.5 01/21/2009 0828   PROT 7.0 01/07/2015 0818   PROT 6.9 01/21/2009 0828   ALBUMIN 3.9 01/07/2015 0818   ALBUMIN 3.7 01/21/2009 0828   AST 18 01/07/2015 0818   AST 22 01/21/2009 0828   ALT 15 01/07/2015 0818   ALT 19 01/21/2009 0828   ALKPHOS 60 01/07/2015 0818   ALKPHOS 43 01/21/2009 0828   BILITOT 0.65 01/07/2015 0818   BILITOT 1.1 01/21/2009 0828   GFRNONAA 95.01 01/21/2009 0828    INo results found for: SPEP, UPEP  Lab Results  Component Value Date   WBC 6.6 01/07/2015   NEUTROABS 4.2 01/07/2015   HGB 14.0 01/07/2015   HCT 41.5 01/07/2015   MCV 95.1 01/07/2015   PLT 319 01/07/2015      Chemistry      Component Value Date/Time   NA 139 01/07/2015 0818   NA 140 01/21/2009 0828   K 4.6 01/07/2015 0818    K 4.1 01/21/2009 0828   CL 108 01/21/2009 0828   CO2 26 01/07/2015 0818   CO2 30 01/21/2009 0828   BUN 15.4 01/07/2015 0818   BUN 13 01/21/2009 0828   CREATININE 0.8 01/07/2015 0818   CREATININE 0.7 01/21/2009 0828      Component Value Date/Time   CALCIUM 8.8 01/07/2015 0818   CALCIUM 8.5 01/21/2009 0828   ALKPHOS 60 01/07/2015 0818   ALKPHOS 43 01/21/2009 0828   AST 18 01/07/2015 0818   AST 22 01/21/2009 0828   ALT 15 01/07/2015 0818   ALT 19 01/21/2009 0828   BILITOT 0.65 01/07/2015 0818   BILITOT 1.1 01/21/2009  0828       No results found for: LABCA2  No components found for: LABCA125  No results for input(s): INR in the last 168 hours.  Urinalysis    Component Value Date/Time   COLORURINE yellow 10/05/2009 1454   APPEARANCEUR Clear 10/05/2009 1454   LABSPEC 1.010 10/05/2009 1454   PHURINE 7.0 10/05/2009 1454   HGBUR 2+ 10/05/2009 1454   BILIRUBINUR negative 04/07/2014 1605   BILIRUBINUR negative 10/05/2009 1454   PROTEINUR negative 04/07/2014 1605   UROBILINOGEN negative 04/07/2014 1605   UROBILINOGEN 0.2 10/05/2009 1454   NITRITE negative 04/07/2014 1605   NITRITE negative 10/05/2009 1454   LEUKOCYTESUR Negative 04/07/2014 1605    STUDIES: Dg Chest 2 View  01/22/2015   CLINICAL DATA:  Newly diagnosed right breast carcinoma. Pre-op respiratory exam  EXAM: CHEST  2 VIEW  COMPARISON:  None.  FINDINGS: The heart size and mediastinal contours are within normal limits. Both lungs are clear. No evidence of pleural effusion. No mass or lymphadenopathy identified. Incidental note is made of nipple shadow overlying the left lung base on the frontal projection. The visualized skeletal structures are unremarkable except for mild thoracic dextroscoliosis.  IMPRESSION: No active cardiopulmonary disease.   Electronically Signed   By: Earle Gell M.D.   On: 01/22/2015 17:03   Nm Sentinel Node Inj-no Rpt (breast)  01/23/2015   CLINICAL DATA: Cancer right breast   Sulfur colloid  was injected intradermally by the nuclear medicine  technologist for breast cancer sentinel node localization.    Mm Breast Surgical Specimen  01/23/2015   CLINICAL DATA:  54 year old female status post right lumpectomy  EXAM: SPECIMEN RADIOGRAPH OF THE RIGHT BREAST  COMPARISON:  Previous exam(s).  FINDINGS: Status post excision of the right breast. The radioactive seed and biopsy marker clip are present, completely intact, and were marked for pathology.  IMPRESSION: Specimen radiograph of the right breast.   Electronically Signed   By: Pamelia Hoit M.D.   On: 01/23/2015 13:25   Dg Chest Port 1 View  01/23/2015   CLINICAL DATA:  54 year old female status post porta cath placement. Initial encounter. Breast cancer.  EXAM: PORTABLE CHEST - 1 VIEW  COMPARISON:  01/22/2015 chest radiographs  FINDINGS: Seated AP portable view of the chest at 1543 hours. Left chest subclavian approach porta cath placed. Catheter tip at the cavoatrial junction level. Stable cardiac size and mediastinal contours. No pneumothorax.  Interval postoperative changes to the right chest wall. Mildly lower lung volumes. No pulmonary edema, pleural effusion or consolidation. Infrahilar atelectasis. Asymmetric left first rib costochondral calcifications suspected to explain these stable small density partially projecting over the posterior left fifth rib. No pulmonary nodule identified.  IMPRESSION: 1. Left chest porta cath placed with no adverse features. Right chest wall postoperative changes. 2. Infrahilar atelectasis.   Electronically Signed   By: Genevie Ann M.D.   On: 01/23/2015 15:19   Dg Fluoro Guide Cv Line-no Report  01/23/2015   CLINICAL DATA:    FLOURO GUIDE CV LINE  Fluoroscopy was utilized by the requesting physician.  No radiographic  interpretation.    Mm Rt Radioactive Seed Loc Mammo Guide  01/22/2015   CLINICAL DATA:  54 year old female with recently diagnosed next invasive ductal carcinoma of the right breast.  EXAM: MAMMOGRAPHIC  GUIDED RADIOACTIVE SEED LOCALIZATION OF THE RIGHT BREAST  COMPARISON:  Previous exam(s)  FINDINGS: Patient presents for radioactive seed localization prior to right breast lumpectomy. I met with the patient and we discussed the  procedure of seed localization including benefits and alternatives. We discussed the high likelihood of a successful procedure. We discussed the risks of the procedure including infection, bleeding, tissue injury and further surgery. We discussed the low dose of radioactivity involved in the procedure. Informed, written consent was given.  The usual time-out protocol was performed immediately prior to the procedure.  Using mammographic guidance, sterile technique, 2% lidocaine and an I-125 radioactive seed, the ribbon shaped biopsy marking clip with associated mass in the upper-outer right breast was localized using a lateral to medial approach. The follow-up mammogram images confirm the seed in the expected location and are marked for Dr. Dalbert Batman.  Follow-up survey of the patient confirms presence of radioactive seed.  Order number of I-125 seed:  [id number].  Total activity:  0.260  Reference Date: 01/15/2015  The patient tolerated the procedure well and was released from the [facility]. She was given instructions regarding seed removal.  IMPRESSION: Radioactive seed localization right breast. No apparent complications.   Electronically Signed   By: Everlean Alstrom M.D.   On: 01/22/2015 15:49    Transthoracic Echocardiography  Patient:  Sumer, [place] MR #:    [id number] Study Date: 02/03/2015 Gender:   F Age:    37 Height:   167.6 cm Weight:   65.8 kg BSA:    1.76 m^2 Pt. Status: Room:  ORDERING   Missey Hasley, Virgie Dad REFERRING  Nikolaj Geraghty, Virgie Dad ATTENDING  Burnis Medin SONOGRAPHER Johny Chess, RDCS, CCT PERFORMING  [facility], Outpatient  cc:  ------------------------------------------------------------------- LV EF: 55% -   60%  -------------------------------------------------------------------  ASSESSMENT: 54 y.o. [place] woman status post right breast biopsy 12/26/2014 for a clinical T1 cN0, stage IA invasive ductal carcinoma, grade 1 or 2, estrogen and progesterone receptor positive, with an MIB-1 of 11%, and HER-2 amplified, with a signals ratio of 2.07, number per cell 2.28.  (1) status post right lumpectomy and sentinel lymph node sampling 01/23/2015 for a pT1c pN1, stage IIA invasive ductal carcinoma, grade 1, with focally positive anterior margins  (2) chemotherapy will consist of doxorubicin and cyclophosphamide in dose dense fashion 4 followed by paclitaxel weekly given with trastuzumab and pertuzumab  (3) trastuzumab will be continued to complete 1 year  (4) adjuvant radiation to follow chemotherapy  (5) anti-estrogens to follow radiation  PLAN: Pt's appt rescheduled due to surgery   Chauncey Cruel, MD   02/14/2015 4:57 PM Medical Oncology and Hematology [facility] [place], [place] [address] Tel. [phone]    Fax. [phone]

## 2015-02-16 ENCOUNTER — Ambulatory Visit (HOSPITAL_BASED_OUTPATIENT_CLINIC_OR_DEPARTMENT_OTHER): Payer: BLUE CROSS/BLUE SHIELD | Admitting: Anesthesiology

## 2015-02-16 ENCOUNTER — Ambulatory Visit (HOSPITAL_BASED_OUTPATIENT_CLINIC_OR_DEPARTMENT_OTHER)
Admission: RE | Admit: 2015-02-16 | Discharge: 2015-02-16 | Disposition: A | Discharge status: Home / Self Care | Payer: BLUE CROSS/BLUE SHIELD | Source: Ambulatory Visit | Attending: General Surgery | Admitting: General Surgery

## 2015-02-16 ENCOUNTER — Telehealth: Payer: Self-pay | Admitting: Oncology

## 2015-02-16 ENCOUNTER — Ambulatory Visit: Payer: BLUE CROSS/BLUE SHIELD | Admitting: Oncology

## 2015-02-16 ENCOUNTER — Encounter (HOSPITAL_BASED_OUTPATIENT_CLINIC_OR_DEPARTMENT_OTHER)
Admission: RE | Disposition: A | Discharge status: Home / Self Care | Payer: Self-pay | Source: Ambulatory Visit | Attending: General Surgery

## 2015-02-16 ENCOUNTER — Encounter (HOSPITAL_BASED_OUTPATIENT_CLINIC_OR_DEPARTMENT_OTHER): Payer: Self-pay

## 2015-02-16 DIAGNOSIS — K219 Gastro-esophageal reflux disease without esophagitis: Secondary | ICD-10-CM | POA: Insufficient documentation

## 2015-02-16 DIAGNOSIS — Z79899 Other long term (current) drug therapy: Secondary | ICD-10-CM | POA: Insufficient documentation

## 2015-02-16 DIAGNOSIS — G930 Cerebral cysts: Secondary | ICD-10-CM | POA: Diagnosis not present

## 2015-02-16 DIAGNOSIS — Z87891 Personal history of nicotine dependence: Secondary | ICD-10-CM | POA: Insufficient documentation

## 2015-02-16 DIAGNOSIS — Z170 Estrogen receptor positive status [ER+]: Secondary | ICD-10-CM

## 2015-02-16 DIAGNOSIS — C50411 Malignant neoplasm of upper-outer quadrant of right female breast: Secondary | ICD-10-CM | POA: Diagnosis present

## 2015-02-16 DIAGNOSIS — E039 Hypothyroidism, unspecified: Secondary | ICD-10-CM | POA: Insufficient documentation

## 2015-02-16 HISTORY — PX: RE-EXCISION OF BREAST LUMPECTOMY: SHX6048

## 2015-02-16 LAB — POCT HEMOGLOBIN-HEMACUE: Hemoglobin: 13.5 g/dL (ref 12.0–15.0)

## 2015-02-16 SURGERY — EXCISION, LESION, BREAST
Anesthesia: General | Site: Breast | Laterality: Right

## 2015-02-16 MED ORDER — LACTATED RINGERS IV SOLN
INTRAVENOUS | Status: DC
  Administered 2015-02-16 (×2): via INTRAVENOUS

## 2015-02-16 MED ORDER — LIDOCAINE-EPINEPHRINE (PF) 1 %-1:200000 IJ SOLN
INTRAMUSCULAR | Status: AC
  Filled 2015-02-16: qty 10

## 2015-02-16 MED ORDER — SCOPOLAMINE 1 MG/3DAYS TD PT72: 1.0000 | MEDICATED_PATCH | Freq: Once | TRANSDERMAL | Status: DC | PRN

## 2015-02-16 MED ORDER — FENTANYL CITRATE (PF) 100 MCG/2ML IJ SOLN
50.0000 ug | INTRAMUSCULAR | Status: DC | PRN
Start: 2015-02-16 — End: 2015-02-16
  Administered 2015-02-16: 100 ug via INTRAVENOUS

## 2015-02-16 MED ORDER — GLYCOPYRROLATE 0.2 MG/ML IJ SOLN: 0.2000 mg | Freq: Once | INTRAMUSCULAR | Status: DC | PRN

## 2015-02-16 MED ORDER — CEFAZOLIN SODIUM-DEXTROSE 2-3 GM-% IV SOLR
INTRAVENOUS | Status: AC
  Filled 2015-02-16: qty 50

## 2015-02-16 MED ORDER — FENTANYL CITRATE (PF) 100 MCG/2ML IJ SOLN
INTRAMUSCULAR | Status: AC
  Filled 2015-02-16: qty 2

## 2015-02-16 MED ORDER — PROPOFOL 10 MG/ML IV BOLUS
INTRAVENOUS | Status: DC | PRN
  Administered 2015-02-16: 200 mg via INTRAVENOUS

## 2015-02-16 MED ORDER — DEXAMETHASONE SODIUM PHOSPHATE 4 MG/ML IJ SOLN
INTRAMUSCULAR | Status: DC | PRN
  Administered 2015-02-16: 10 mg via INTRAVENOUS

## 2015-02-16 MED ORDER — MIDAZOLAM HCL 2 MG/2ML IJ SOLN
INTRAMUSCULAR | Status: AC
  Filled 2015-02-16: qty 2

## 2015-02-16 MED ORDER — SODIUM BICARBONATE 4 % IV SOLN
INTRAVENOUS | Status: AC
  Filled 2015-02-16: qty 5

## 2015-02-16 MED ORDER — ONDANSETRON HCL 4 MG/2ML IJ SOLN
INTRAMUSCULAR | Status: DC | PRN
  Administered 2015-02-16: 4 mg via INTRAVENOUS

## 2015-02-16 MED ORDER — FENTANYL CITRATE (PF) 100 MCG/2ML IJ SOLN
25.0000 ug | INTRAMUSCULAR | Status: DC | PRN
  Administered 2015-02-16: 50 ug via INTRAVENOUS
  Administered 2015-02-16: 25 ug via INTRAVENOUS
  Administered 2015-02-16: 50 ug via INTRAVENOUS

## 2015-02-16 MED ORDER — BUPIVACAINE-EPINEPHRINE (PF) 0.5% -1:200000 IJ SOLN
INTRAMUSCULAR | Status: AC
  Filled 2015-02-16: qty 30

## 2015-02-16 MED ORDER — BUPIVACAINE-EPINEPHRINE 0.5% -1:200000 IJ SOLN
INTRAMUSCULAR | Status: DC | PRN
  Administered 2015-02-16: 9 mL

## 2015-02-16 MED ORDER — LIDOCAINE HCL (CARDIAC) 20 MG/ML IV SOLN
INTRAVENOUS | Status: DC | PRN
Start: 2015-02-16 — End: 2015-02-16
  Administered 2015-02-16: 60 mg via INTRAVENOUS

## 2015-02-16 MED ORDER — MIDAZOLAM HCL 2 MG/2ML IJ SOLN
1.0000 mg | INTRAMUSCULAR | Status: DC | PRN
Start: 2015-02-16 — End: 2015-02-16
  Administered 2015-02-16: 2 mg via INTRAVENOUS

## 2015-02-16 MED ORDER — FENTANYL CITRATE (PF) 100 MCG/2ML IJ SOLN
INTRAMUSCULAR | Status: AC
  Filled 2015-02-16: qty 4

## 2015-02-16 MED ORDER — OXYCODONE-ACETAMINOPHEN 7.5-325 MG PO TABS: 1.0000 | ORAL_TABLET | ORAL | Status: DC | PRN

## 2015-02-16 SURGICAL SUPPLY — 57 items
ADH SKN CLS APL DERMABOND .7 (GAUZE/BANDAGES/DRESSINGS) ×1
APL SKNCLS STERI-STRIP NONHPOA (GAUZE/BANDAGES/DRESSINGS)
APPLIER CLIP 9.375 MED OPEN (MISCELLANEOUS) ×2
APR CLP MED 9.3 20 MLT OPN (MISCELLANEOUS) ×1
BANDAGE ELASTIC 6 VELCRO ST LF (GAUZE/BANDAGES/DRESSINGS) IMPLANT
BENZOIN TINCTURE PRP APPL 2/3 (GAUZE/BANDAGES/DRESSINGS) IMPLANT
BINDER BREAST MEDIUM (GAUZE/BANDAGES/DRESSINGS) ×1 IMPLANT
BLADE HEX COATED 2.75 (ELECTRODE) ×2 IMPLANT
BLADE SURG 15 STRL LF DISP TIS (BLADE) ×2 IMPLANT
BLADE SURG 15 STRL SS (BLADE) ×4
CANISTER SUCT 1200ML W/VALVE (MISCELLANEOUS) ×2 IMPLANT
CHLORAPREP W/TINT 26ML (MISCELLANEOUS) ×2 IMPLANT
CLIP APPLIE 9.375 MED OPEN (MISCELLANEOUS) ×1 IMPLANT
COVER BACK TABLE 60X90IN (DRAPES) ×2 IMPLANT
COVER MAYO STAND STRL (DRAPES) ×2 IMPLANT
DECANTER SPIKE VIAL GLASS SM (MISCELLANEOUS) IMPLANT
DERMABOND ADVANCED (GAUZE/BANDAGES/DRESSINGS) ×1
DERMABOND ADVANCED .7 DNX12 (GAUZE/BANDAGES/DRESSINGS) IMPLANT
DRAPE LAPAROTOMY 100X72 PEDS (DRAPES) ×2 IMPLANT
DRAPE LAPAROTOMY TRNSV 102X78 (DRAPE) IMPLANT
DRAPE UTILITY XL STRL (DRAPES) ×2 IMPLANT
ELECT REM PT RETURN 9FT ADLT (ELECTROSURGICAL) ×2
ELECTRODE REM PT RTRN 9FT ADLT (ELECTROSURGICAL) ×1 IMPLANT
GAUZE SPONGE 4X4 16PLY XRAY LF (GAUZE/BANDAGES/DRESSINGS) IMPLANT
GLOVE EUDERMIC 7 POWDERFREE (GLOVE) ×2 IMPLANT
GOWN STRL REUS W/ TWL LRG LVL3 (GOWN DISPOSABLE) ×1 IMPLANT
GOWN STRL REUS W/ TWL XL LVL3 (GOWN DISPOSABLE) ×1 IMPLANT
GOWN STRL REUS W/TWL LRG LVL3 (GOWN DISPOSABLE) ×2
GOWN STRL REUS W/TWL XL LVL3 (GOWN DISPOSABLE) ×2
KIT MARKER MARGIN INK (KITS) ×1 IMPLANT
NDL HYPO 25X1 1.5 SAFETY (NEEDLE) ×1 IMPLANT
NEEDLE HYPO 22GX1.5 SAFETY (NEEDLE) IMPLANT
NEEDLE HYPO 25X1 1.5 SAFETY (NEEDLE) ×2 IMPLANT
NS IRRIG 1000ML POUR BTL (IV SOLUTION) ×2 IMPLANT
PACK BASIN DAY SURGERY FS (CUSTOM PROCEDURE TRAY) ×2 IMPLANT
PENCIL BUTTON HOLSTER BLD 10FT (ELECTRODE) ×2 IMPLANT
SLEEVE SCD COMPRESS KNEE MED (MISCELLANEOUS) ×1 IMPLANT
SPONGE GAUZE 4X4 12PLY STER LF (GAUZE/BANDAGES/DRESSINGS) IMPLANT
SPONGE LAP 4X18 X RAY DECT (DISPOSABLE) ×2 IMPLANT
STAPLER VISISTAT 35W (STAPLE) IMPLANT
STRIP CLOSURE SKIN 1/2X4 (GAUZE/BANDAGES/DRESSINGS) IMPLANT
SUT ETHILON 4 0 PS 2 18 (SUTURE) IMPLANT
SUT MNCRL AB 4-0 PS2 18 (SUTURE) ×2 IMPLANT
SUT SILK 2 0 SH (SUTURE) ×2 IMPLANT
SUT VIC AB 2-0 SH 27 (SUTURE)
SUT VIC AB 2-0 SH 27XBRD (SUTURE) IMPLANT
SUT VIC AB 3-0 FS2 27 (SUTURE) IMPLANT
SUT VIC AB 4-0 P-3 18XBRD (SUTURE) IMPLANT
SUT VIC AB 4-0 P3 18 (SUTURE)
SUT VICRYL 3-0 CR8 SH (SUTURE) ×2 IMPLANT
SUT VICRYL 4-0 PS2 18IN ABS (SUTURE) IMPLANT
SYR BULB 3OZ (MISCELLANEOUS) IMPLANT
SYRINGE 10CC LL (SYRINGE) ×2 IMPLANT
TAPE HYPAFIX 4 X10 (GAUZE/BANDAGES/DRESSINGS) IMPLANT
TOWEL OR NON WOVEN STRL DISP B (DISPOSABLE) ×2 IMPLANT
TUBE CONNECTING 20X1/4 (TUBING) ×2 IMPLANT
YANKAUER SUCT BULB TIP NO VENT (SUCTIONS) ×2 IMPLANT

## 2015-02-16 NOTE — Transfer of Care (Signed)
Immediate Anesthesia Transfer of Care Note  Patient: Bonnie Ward  Procedure(s) Performed: Procedure(s): RE-EXCISION OF RIGHT  BREAST LUMPECTOMY MARGINS (Right)  Patient Location: PACU  Anesthesia Type:General  Level of Consciousness: sedated  Airway & Oxygen Therapy: Patient Spontanous Breathing and Patient connected to face mask oxygen  Post-op Assessment: Report given to RN and Post -op Vital signs reviewed and stable  Post vital signs: Reviewed and stable  Last Vitals:  Filed Vitals:   02/16/15 1056  BP: 125/76  Pulse: 84  Temp: 36.5 C  Resp: 20    Complications: No apparent anesthesia complications

## 2015-02-16 NOTE — Anesthesia Procedure Notes (Signed)
Procedure Name: LMA Insertion Date/Time: 02/16/2015 11:31 AM Performed by: Maryella Shivers Pre-anesthesia Checklist: Patient identified, Emergency Drugs available, Suction available and Patient being monitored Patient Re-evaluated:Patient Re-evaluated prior to inductionOxygen Delivery Method: Circle System Utilized Preoxygenation: Pre-oxygenation with 100% oxygen Intubation Type: IV induction Ventilation: Mask ventilation without difficulty LMA: LMA inserted LMA Size: 4.0 Number of attempts: 1 Airway Equipment and Method: bite block Placement Confirmation: positive ETCO2 Tube secured with: Tape Dental Injury: Teeth and Oropharynx as per pre-operative assessment

## 2015-02-16 NOTE — Anesthesia Preprocedure Evaluation (Addendum)
Anesthesia Evaluation  Patient identified by MRN, date of birth, ID band Patient awake    Reviewed: Allergy & Precautions, NPO status , Patient's Chart, lab work & pertinent test results  Airway Mallampati: II  TM Distance: >3 FB Neck ROM: Full    Dental   Pulmonary former smoker,  breath sounds clear to auscultation        Cardiovascular negative cardio ROS  Rhythm:Regular Rate:Normal     Neuro/Psych    GI/Hepatic Neg liver ROS, GERD-  ,  Endo/Other  Hypothyroidism   Renal/GU negative Renal ROS     Musculoskeletal  (+) Arthritis -,   Abdominal   Peds  Hematology   Anesthesia Other Findings   Reproductive/Obstetrics                            Anesthesia Physical Anesthesia Plan  ASA: II  Anesthesia Plan: General   Post-op Pain Management:    Induction: Intravenous  Airway Management Planned: Oral ETT  Additional Equipment:   Intra-op Plan:   Post-operative Plan: Extubation in OR  Informed Consent: I have reviewed the patients History and Physical, chart, labs and discussed the procedure including the risks, benefits and alternatives for the proposed anesthesia with the patient or authorized representative who has indicated his/her understanding and acceptance.   Dental advisory given  Plan Discussed with: CRNA and Anesthesiologist  Anesthesia Plan Comments:         Anesthesia Quick Evaluation

## 2015-02-16 NOTE — Telephone Encounter (Signed)
Lft msg for pt confirming MD visit per 06/27 POF.... Mailed out schedule to pt .... Cherylann Banas

## 2015-02-16 NOTE — Interval H&P Note (Signed)
History and Physical Interval Note:  02/16/2015 11:16 AM  Bonnie Ward  has presented today for surgery, with the diagnosis of right breast cancer  The various methods of treatment have been discussed with the patient and family. After consideration of risks, benefits and other options for treatment, the patient has consented to  Procedure(s): RE-EXCISION OF RIGHT  BREAST LUMPECTOMY MARGINS (Right) as a surgical intervention .  The patient's history has been reviewed, patient examined, no change in status, stable for surgery.  I have reviewed the patient's chart and labs.  Questions were answered to the patient's satisfaction.     [name]

## 2015-02-16 NOTE — Discharge Instructions (Signed)
[facility] Phone Number [phone]  BREAST BIOPSY/ LUMPECTOMY  POST OP INSTRUCTIONS  Always review your discharge instruction sheet given to you by the facility where your surgery was performed.  IF YOU HAVE DISABILITY OR FAMILY LEAVE FORMS, YOU MUST BRING THEM TO THE OFFICE FOR PROCESSING.  DO NOT GIVE THEM TO YOUR DOCTOR.  1. A prescription for pain medication may be given to you upon discharge.  Take your pain medication as prescribed, if needed.  If narcotic pain medicine is not needed, then you may take acetaminophen (Tylenol) or ibuprofen (Advil) as needed. 2. Take your usually prescribed medications unless otherwise directed 3. If you need a refill on your pain medication, please contact your pharmacy.  They will contact our office to request authorization.  Prescriptions will not be filled after 5pm or on week-ends. 4. You should eat very light the first 24 hours after surgery, such as soup, crackers, pudding, etc.  Resume your normal diet the day after surgery. 5. Most patients will experience some swelling and bruising in the breast.  Ice packs and a good support bra will help.  Swelling and bruising can take several days to resolve.  6. It is common to experience some constipation if taking pain medication after surgery.  Increasing fluid intake and taking a stool softener will usually help or prevent this problem from occurring.  A mild laxative (Milk of Magnesia or Miralax) should be taken according to package directions if there are no bowel movements after 48 hours. 7. Unless discharge instructions indicate otherwise, you may remove your bandages 24-48 hours after surgery, and you may shower at that time.  You may have steri-strips (small skin tapes) in place directly over the incision.  These strips should be left on the skin for 7-10 days.  If your surgeon used skin glue on the incision, you may shower in 24 hours.  The glue will flake off over the next 2-3  weeks.  Any sutures or staples will be removed at the office during your follow-up visit. 8. ACTIVITIES:  You may resume regular daily activities (gradually increasing) beginning the next day.  Wearing a good support bra or sports bra minimizes pain and swelling.  You may have sexual intercourse when it is comfortable. a. You may drive when you no longer are taking prescription pain medication, you can comfortably wear a seatbelt, and you can safely maneuver your car and apply brakes. b. RETURN TO WORK:  ______________________________________________________________________________________ 9. You should see your doctor in the office for a follow-up appointment approximately two weeks after your surgery.  Your doctors nurse will typically make your follow-up appointment when she calls you with your pathology report.  Expect your pathology report 2-3 business days after your surgery.  You may call to check if you do not hear from [place] after three days.   WHEN TO CALL YOUR DOCTOR: 1. Fever over 101.0 2. Nausea and/or vomiting. 3. Extreme swelling or bruising. 4. Continued bleeding from incision. 5. Increased pain, redness, or drainage from the incision.  The clinic staff is available to answer your questions during regular business hours.  Please dont hesitate to call and ask to speak to one of the nurses for clinical concerns.  If you have a medical emergency, go to the nearest emergency room or call 911.  A surgeon from [facility] is always on call at the hospital.  For further questions, please visit [email or web address]  Instructions  Activity: Get plenty of rest for the remainder of the day. A responsible adult should stay with you for 24 hours following the procedure.  For the next 24 hours, DO NOT: -Drive a car -Paediatric nurse -Drink alcoholic beverages -Take any medication unless instructed by your physician -Make any legal  decisions or sign important papers.  Meals: Start with liquid foods such as gelatin or soup. Progress to regular foods as tolerated. Avoid greasy, spicy, heavy foods. If nausea and/or vomiting occur, drink only clear liquids until the nausea and/or vomiting subsides. Call your physician if vomiting continues.  Special Instructions/Symptoms: Your throat may feel dry or sore from the anesthesia or the breathing tube placed in your throat during surgery. If this causes discomfort, gargle with warm salt water. The discomfort should disappear within 24 hours.  If you had a scopolamine patch placed behind your ear for the management of post- operative nausea and/or vomiting:  1. The medication in the patch is effective for 72 hours, after which it should be removed.  Wrap patch in a tissue and discard in the trash. Wash hands thoroughly with soap and water. 2. You may remove the patch earlier than 72 hours if you experience unpleasant side effects which may include dry mouth, dizziness or visual disturbances. 3. Avoid touching the patch. Wash your hands with soap and water after contact with the patch.

## 2015-02-16 NOTE — Anesthesia Postprocedure Evaluation (Signed)
  Anesthesia Post-op Note  Patient: Bonnie Ward  Procedure(s) Performed: Procedure(s): RE-EXCISION OF RIGHT  BREAST LUMPECTOMY MARGINS (Right)  Patient Location: PACU  Anesthesia Type:General  Level of Consciousness: awake  Airway and Oxygen Therapy: Patient Spontanous Breathing  Post-op Pain: mild  Post-op Assessment: Post-op Vital signs reviewed              Post-op Vital Signs: Reviewed  Last Vitals:  Filed Vitals:   02/16/15 1335  BP: 116/79  Pulse: 82  Temp: 36.5 C  Resp: 16    Complications: No apparent anesthesia complications

## 2015-02-16 NOTE — Op Note (Signed)
Patient Name:           Bonnie Ward   Date of Surgery:        02/16/2015  Pre op Diagnosis:      Invasive ductal carcinoma right breast, upper outer quadrant, triple positive breast cancer.  Pathologic stage TIc, N1a.    Post op Diagnosis:    Same  Procedure:                 Right breast lumpectomy with reexcision of anterior margin  Surgeon:                     Edsel Petrin. Dalbert Batman, M.D., FACS  Assistant:                      OR staff  Operative Indications:     On January 23, 2015 she underwent right partial mastectomy seed localization, reexcision of lateral and superior margins, right axillary sentinel node biopsy, and port insertion for her triple-positive breast cancer. She still a little sore and a little swollen but doing fine. 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 Her posterior margin is close, but we took the dissection all the way down to the pectoralis muscle and so no further excision is warranted there. The tumor was posterior, however. Interestingly, the anterior margin is very close, less than 0.01 cm. I discussed this with Dr. Lyndon Code and Dr. Tillman Sers and they say that there are microscopic nests of invasive cancer that are nonpalpable anteriorly, away from the primary mass. T1c, N1a. I have advised that this area be reexcised. I told her that most likely this would render a negative margin. I told her there was the possibility of multifocal disease and a small chance that she might wind up with a mastectomy, but hopefully not. . She is going to need chemotherapy regardless because she is triple-positive and she understands that.  I offered her an opinion to discuss this with Dr. Pablo Ledger or Dr. Jana Hakim but she was okay with going ahead with surgery. We talked about the techniques of reexcision and what I was trying to accomplish. She is aware of the risks, including bleeding, infection, skin necrosis, nerve damage,  and cosmetic deformity. She understands all these issues. All of her questions are answered. She agrees with this plan.  Operative Findings:       Right breast lumpectomy incision upper-outer quadrant was healing normally.  No signs of infection.  There was no gross of evidence of cancer once I got down to the lumpectomy cavity.  I excised a generous portion of the anterior margin, at least 7 mm in thickness.  This was marked with ink.  Procedure in Detail:          Following the induction of general endotracheal anesthesia the patient's right breast was prepped and draped in a sterile fashion.  Intravenous antibiotic were given.  Surgical timeout was performed.  0.5% Marcaine with epinephrine was used as local infiltration and aesthetic.      I made incision at the recent lumpectomy site and dissected down through the skin and dermis.  I removed the 0 Monocryl sutures.  I created the dissection down into the breast tissue posteriorly until I encountered the lumpectomy cavity.  I then assessed the geometry of the lumpectomy cavity.  Using electrocautery I shaved a generous piece of the anterior margin, approximately 5 cm x 5 cm x  7 mm thick.  The new anterior margin was marked with green ink.  The old lumpectomy cavity was marked with black ink..  The specimen was sent fresh to the lab.  Hemostasis was excellent and achieved electrocautery.  Placed a few metallic clips and the new margins of the lumpectomy cavity.  The breast tissues were reapproximated with 30 Vicryls sutures and skin closed with a running subcuticular 4-0 Monocryl and Dermabond.  Breast binder and ice pack were placed.  The patient tolerated the procedure well was taken to PACU in stable condition.  EBL 15 mL.  Counts correct.  Complications none.      Edsel Petrin. Dalbert Batman, M.D., FACS General and Minimally Invasive Surgery Breast and Colorectal Surgery  02/16/2015 12:07 PM

## 2015-02-17 ENCOUNTER — Encounter (HOSPITAL_BASED_OUTPATIENT_CLINIC_OR_DEPARTMENT_OTHER): Payer: Self-pay | Admitting: General Surgery

## 2015-02-27 ENCOUNTER — Telehealth: Payer: Self-pay | Admitting: Oncology

## 2015-02-27 ENCOUNTER — Ambulatory Visit (HOSPITAL_BASED_OUTPATIENT_CLINIC_OR_DEPARTMENT_OTHER): Payer: BLUE CROSS/BLUE SHIELD | Admitting: Oncology

## 2015-02-27 VITALS — BP 124/72 | HR 79 | Temp 97.7°F | Resp 18 | Ht 66.0 in | Wt 150.0 lb

## 2015-02-27 DIAGNOSIS — Z170 Estrogen receptor positive status [ER+]: Secondary | ICD-10-CM

## 2015-02-27 DIAGNOSIS — C50411 Malignant neoplasm of upper-outer quadrant of right female breast: Secondary | ICD-10-CM | POA: Diagnosis not present

## 2015-02-27 MED ORDER — LORAZEPAM 0.5 MG PO TABS: 0.5000 mg | ORAL_TABLET | Freq: Every day | ORAL | Status: DC

## 2015-02-27 MED ORDER — ONDANSETRON HCL 8 MG PO TABS: 8.0000 mg | ORAL_TABLET | Freq: Two times a day (BID) | ORAL | Status: DC | PRN

## 2015-02-27 MED ORDER — LIDOCAINE-PRILOCAINE 2.5-2.5 % EX CREA: TOPICAL_CREAM | CUTANEOUS | Status: DC

## 2015-02-27 MED ORDER — DEXAMETHASONE 4 MG PO TABS: ORAL_TABLET | ORAL | Status: DC

## 2015-02-27 MED ORDER — PROCHLORPERAZINE MALEATE 10 MG PO TABS: 10.0000 mg | ORAL_TABLET | Freq: Four times a day (QID) | ORAL | Status: DC | PRN

## 2015-02-27 NOTE — Telephone Encounter (Signed)
Gave adn printed appt sched adna vs for pt for July and Aug

## 2015-02-27 NOTE — Progress Notes (Signed)
Bonnie Ward  [phone] [phone]) [phone]     ID: Bonnie Ward DOB: 15-Dec-1960  MR#: [id number]  EXB#:[id number]  Patient Care Team: Lollie Sails, MD as PCP - General (Internal Medicine) Milford Cage, OTR Kem Boroughs, FNP as Nurse Practitioner (Nurse Practitioner) Fanny Skates, MD as Consulting Physician (General Surgery) Chauncey Cruel, MD as Consulting Physician (Oncology) Thea Silversmith, MD as Consulting Physician (Radiation Oncology) Rockwell Germany, RN as Registered Nurse Mauro Kaufmann, RN as Registered Nurse Holley Bouche, NP as Nurse Practitioner (Nurse Practitioner) Kristeen Miss, MD as Consulting Physician (Neurosurgery) PCP: Lollie Sails, MD OTHER MD:  CHIEF COMPLAINT: Estrogen receptor positive breast cancer  CURRENT TREATMENT: adjuvant chemotherapy   BREAST CANCER HISTORY: From the initial intake note:   Bonnie Ward had routine bilateral screening mammography at the [facility] for 20 01/09/2015 showing a possible mass in the right breast. On 12/22/2014 she underwent diagnostic right mammography with tomosynthesis and right breast ultrasonography. The breast density was category C. In the right breast there was an area of increased density with architectural distortion and faint microcalcifications in the upper outer quadrant. There was mild palpable soft tissue thickening in the area in question, but no palpable right axillary lymph nodes. Ultrasound confirmed a 1.7 cm irregular hypoechoic mass. There were no abnormal-appearing right axillary lymph nodes.  Biopsy of the right breast mass in question 12/26/2014 showed (SAA 16-08/08/2005) an invasive ductal carcinoma, grade 1 or 2, with a prognostic panel still pending.  Her subsequent history is as detailed below  INTERVAL HISTORY: Bonnie Ward returns today for follow-up of her breast cancer. After her last visit here, the prognostic panel from her initial biopsy showed  her tumor to be triple positive, with 100% estrogen and 90% progesterone receptor positivity, both with strong staining intensity, with an MIB-1 of 11%, and HER-2 amplified, with a signals ratio of 2.07 and the number per cell 2.28. She then proceeded to right lumpectomy and sentinel lymph node sampling 01/23/2015. This showed [[id number]] an invasive ductal carcinoma, grade 1, measuring 1.6 cm. 1 of 3 sentinel lymph nodes sampled had a macro metastatic deposit. There was no extracapsular extension. Margins were very close posteriorly and anteriorly.   The patient's case was presented at the multidisciplinary breast cancer conference 02/04/2015. At that time the margin question was discussed. The posterior margin is at fascia. The other margin was to be reexcised at the time of port placement. Since the patient's axilla was clinically negative initially there will be no axillary dissection. She would then follow with adjuvant chemotherapy and then radiation and antiestrogens.  Bonnie Ward did undergo reexcision of the anterior margin 02/16/2015, showing (SZA 16-02/03/2003) no evidence of atypia or malignancy. She also had an echocardiogram on 02/03/2015 which showed a normal ejection fraction. She had her port plate in the left upper anterior chest at the time of margin reexcision.  REVIEW OF SYSTEMS: She did well with the excision surgery, with no significant pain, fever, or bleeding. She is still protecting the left shoulder area because of the port, but I have encouraged her to loosen that up, with no specific restrictions.. She incidentally just returned from a trip to [place] which she greatly enjoyed. A detailed review of systems today was otherwise stable.  PAST MEDICAL HISTORY: Past Medical History  Diagnosis Date  . ACID REFLUX DISEASE 07/12/2010  . Arachnoid cyst     left frontal lobe  . Migraines   . Arthritis     hands  .  Nervous stomach   . Difficulty swallowing pills   . Hypothyroidism     . Stress fracture of foot 12/2014    right  . Glaucoma   . Breast cancer 12/2014    right  . Anxiety   . Dental crowns present   . Seasonal allergies   . Complication of anesthesia 2012    states woke up during colonoscopy with Propofol    PAST SURGICAL HISTORY: Past Surgical History  Procedure Laterality Date  . Tonsillectomy  age 59  . Esophagogastroduodenoscopy endoscopy    . Tooth extraction    . Lasik    . Colonoscopy with propofol  10/14/2013  . Radioactive seed guided mastectomy with axillary sentinel lymph node biopsy Right 01/23/2015    Procedure: RIGHT PARTIAL MASTECTOMY WITH RADIOACTIVE SEED LOCALIZATION  WITH RIGHT AXILLARY SENTINEL LYMPH NODE BIOPSY;  Surgeon: Fanny Skates, MD;  Location: [facility];  Service: General;  Laterality: Right;  . Portacath placement Left 01/23/2015    Procedure: INSERTION PORT-A-CATH ;  Surgeon: Fanny Skates, MD;  Location: [place] [facility];  Service: General;  Laterality: Left;  . Re-excision of breast lumpectomy Right 02/16/2015    Procedure: RE-EXCISION OF RIGHT  BREAST LUMPECTOMY MARGINS;  Surgeon: Fanny Skates, MD;  Location: [facility];  Service: General;  Laterality: Right;    FAMILY HISTORY Family History  Problem Relation Age of Onset  . Osteoporosis Mother   . Irritable bowel syndrome Mother   . Hypertension Mother   . Hypertension Father   . Hyperlipidemia Father   . Diabetes Father   . Dementia Father   . Colon cancer Maternal Grandfather 78  . Breast cancer Paternal Aunt   . Breast cancer Paternal Grandmother    the patient's father died at age 66 from pneumonia. [name] died at age 70 after bowel perforation. The patient had one brother, one sister. There is no history of breast or ovarian cancer in the family.  GYNECOLOGIC HISTORY:  Patient's last menstrual period was 01/25/2011. Menarche age 56, the patient is GX P0. She stopped having periods approximately 2011.  She is still on hormone replacement, but is "trickling off it". She did take birth control pills for approximately 3 years remotely with no complications  SOCIAL HISTORY:  Bonnie Ward works as a Haematologist. She is divorced and lives by herself, with 2 dogs.    ADVANCED DIRECTIVES: The patient's brother [name] is her healthcare power of attorney. He may be reached in [place] at [place]: History  Substance Use Topics  . Smoking status: Former Smoker -- 0.00 packs/day for 0 years    Quit date: 02/19/2005  . Smokeless tobacco: Never Used  . Alcohol Use: Yes     Comment: occasionally     Colonoscopy: February 2016  PAP: June 2015  Bone density: October 2015  Lipid panel:  Allergies  Allergen Reactions  . Lortab [Hydrocodone-Acetaminophen] Anxiety  . Milk-Related Compounds Diarrhea    GI UPSET    Current Outpatient Prescriptions  Medication Sig Dispense Refill  . acetaminophen (TYLENOL) 325 MG tablet Take 650 mg by mouth every 6 (six) hours as needed.    . ALPRAZolam (XANAX) 0.25 MG tablet Take 0.25 mg by mouth at bedtime as needed for anxiety. 1/2 tab. daily    . bimatoprost (LUMIGAN) 0.03 % ophthalmic solution Place 1 drop into both eyes at bedtime.    Marland Kitchen liothyronine (CYTOMEL) 5 MCG tablet Take 5 mcg by mouth  2 (two) times daily.    Marland Kitchen oxyCODONE-acetaminophen (PERCOCET) 7.5-325 MG per tablet Take 1 tablet by mouth every 4 (four) hours as needed for severe pain. 30 tablet 0  . SUMAtriptan (IMITREX) 50 MG tablet TAKE 1 TABLET BY MOUTH AT THE ONSET OF HEADACHE. TAKE NO MORE THAN 4 TABLETS BY MOUTH IN 24 HOURS 9 tablet 5   No current facility-administered medications for this visit.    OBJECTIVE: Middle-aged white woman who appears well Filed Vitals:   02/27/15 1041  BP: 124/72  Pulse: 79  Temp: 97.7 F (36.5 C)  Resp: 18     Body mass index is 24.22 kg/(m^2).    ECOG FS:0 - Asymptomatic  Sclerae unicteric, pupils round and equal Oropharynx  clear and moist-- no thrush or other lesions No cervical or supraclavicular adenopathy Lungs no rales or rhonchi Heart regular rate and rhythm Abd soft, nontender, positive bowel sounds MSK no focal spinal tenderness, no upper extremity lymphedema Neuro: nonfocal, well oriented, appropriate affect Breasts: The right breast is status post lumpectomy. The incision is healing nicely. There is no dehiscence, erythema, or swelling. There is no evidence of residual disease. The cosmetic result is good. The right axilla is benign. The left breast is unremarkable  LAB RESULTS:  CMP     Component Value Date/Time   NA 139 01/07/2015 0818   NA 140 01/21/2009 0828   K 4.6 01/07/2015 0818   K 4.1 01/21/2009 0828   CL 108 01/21/2009 0828   CO2 26 01/07/2015 0818   CO2 30 01/21/2009 0828   GLUCOSE 78 01/07/2015 0818   GLUCOSE 90 01/21/2009 0828   BUN 15.4 01/07/2015 0818   BUN 13 01/21/2009 0828   CREATININE 0.8 01/07/2015 0818   CREATININE 0.7 01/21/2009 0828   CALCIUM 8.8 01/07/2015 0818   CALCIUM 8.5 01/21/2009 0828   PROT 7.0 01/07/2015 0818   PROT 6.9 01/21/2009 0828   ALBUMIN 3.9 01/07/2015 0818   ALBUMIN 3.7 01/21/2009 0828   AST 18 01/07/2015 0818   AST 22 01/21/2009 0828   ALT 15 01/07/2015 0818   ALT 19 01/21/2009 0828   ALKPHOS 60 01/07/2015 0818   ALKPHOS 43 01/21/2009 0828   BILITOT 0.65 01/07/2015 0818   BILITOT 1.1 01/21/2009 0828   GFRNONAA 95.01 01/21/2009 0828    INo results found for: SPEP, UPEP  Lab Results  Component Value Date   WBC 6.6 01/07/2015   NEUTROABS 4.2 01/07/2015   HGB 13.5 02/16/2015   HCT 41.5 01/07/2015   MCV 95.1 01/07/2015   PLT 319 01/07/2015      Chemistry      Component Value Date/Time   NA 139 01/07/2015 0818   NA 140 01/21/2009 0828   K 4.6 01/07/2015 0818   K 4.1 01/21/2009 0828   CL 108 01/21/2009 0828   CO2 26 01/07/2015 0818   CO2 30 01/21/2009 0828   BUN 15.4 01/07/2015 0818   BUN 13 01/21/2009 0828   CREATININE 0.8  01/07/2015 0818   CREATININE 0.7 01/21/2009 0828      Component Value Date/Time   CALCIUM 8.8 01/07/2015 0818   CALCIUM 8.5 01/21/2009 0828   ALKPHOS 60 01/07/2015 0818   ALKPHOS 43 01/21/2009 0828   AST 18 01/07/2015 0818   AST 22 01/21/2009 0828   ALT 15 01/07/2015 0818   ALT 19 01/21/2009 0828   BILITOT 0.65 01/07/2015 0818   BILITOT 1.1 01/21/2009 0828       No results found for: LABCA2  No components found for: LABCA125  No results for input(s): INR in the last 168 hours.  Urinalysis    Component Value Date/Time   COLORURINE yellow 10/05/2009 1454   APPEARANCEUR Clear 10/05/2009 1454   LABSPEC 1.010 10/05/2009 1454   PHURINE 7.0 10/05/2009 1454   HGBUR 2+ 10/05/2009 1454   BILIRUBINUR negative 04/07/2014 1605   BILIRUBINUR negative 10/05/2009 1454   PROTEINUR negative 04/07/2014 1605   UROBILINOGEN negative 04/07/2014 1605   UROBILINOGEN 0.2 10/05/2009 1454   NITRITE negative 04/07/2014 1605   NITRITE negative 10/05/2009 1454   LEUKOCYTESUR Negative 04/07/2014 1605    STUDIES: No results found.  Transthoracic Echocardiography  Patient:  Ivionna, Verley MR #:    [id number] Study Date: 02/03/2015 Gender:   F Age:    54 Height:   167.6 cm Weight:   65.8 kg BSA:    36.2 m^35 89-year-old well below her here [facility] in her bone marrow she called the number: Marylyn Ishihara just regular in her all she she will can brush her not to Dr. is elevated rash a rash on your face rash as well on his little spots in the way I think is related to the chemotherapy 1 status were involved her again in her ankles or anything else he is $ Pt. Status: Room:  ORDERING   Magrinat, Virgie Dad REFERRING  Magrinat, Virgie Dad ATTENDING  Burnis Medin SONOGRAPHER Johny Chess, RDCS, CCT PERFORMING  [facility], Outpatient  cc:  ------------------------------------------------------------------- LV EF: 55% -   60%  -------------------------------------------------------------------  ASSESSMENT: 54 y.o.  woman status post right breast biopsy 12/26/2014 for a clinical T1 cN0, stage IA invasive ductal carcinoma, grade 1 or 2, estrogen and progesterone receptor positive, with an MIB-1 of 11%, and HER-2 amplified, with a signals ratio of 2.07, number per cell 2.28.  (1) status post right lumpectomy and sentinel lymph node sampling 01/23/2015 for a pT1c pN1, stage IIA invasive ductal carcinoma, grade 1, with focally positive anterior margins  (a) additional surgery for margin clearance 02/16/2015 followed residual disease  (2) chemotherapy will  start 03/05/2015, to consist of doxorubicin and cyclophosphamide in dose dense fashion 4 followed by paclitaxel weekly,  with trastuzumab and pertuzumab given every 3 weeks   (3) trastuzumab will be continued to complete 1 year  (a) echocardiogram 02/03/2015 shows a normal ejection fraction  (4) adjuvant radiation to follow chemotherapy  (5) anti-estrogens to follow radiation  PLAN: I spent approximately 50 minutes today with Consandra going over her situation and discussing the treatment plan. She will receive a dose dense before meals beginning next thirst. I discussed the possible toxicities, side effects and complications of these agents I also gave her matrix on how to take her antinausea and other supportive medications.  She prefers the onpro system so we will cancer her appointments on day 2. We're going to see her about a week later after each dose to make sure we are troubleshooting problems and making the treatment is easy on her as possible. I will like to drop in on her first post chemotherapy visit and then I will see her again August 25 with her last cycle. At that time I will set her up for the weekly paclitaxel and anti-HER-2 treatments to follow.   She brought me a list of the various supplements and vitamins that she takes, and I  suggested she cancel the very high doses of A, D, and taurine she was taking.  The other supplementshe can continue.  I encouraged her to exercise on a regular basis to prevent the weight gain given at frequently accompanies chemotherapy treatments.  She has a good understanding of the overall plan. She agrees with it. She knows to call for any problems that may develop for the next visit    Chauncey Cruel, MD   02/27/2015 10:42 AM Medical Oncology and Hematology [facility] [place], [place] [address] Tel. [phone]    Fax. [phone]

## 2015-03-05 ENCOUNTER — Other Ambulatory Visit (HOSPITAL_BASED_OUTPATIENT_CLINIC_OR_DEPARTMENT_OTHER): Payer: BLUE CROSS/BLUE SHIELD

## 2015-03-05 ENCOUNTER — Ambulatory Visit (HOSPITAL_BASED_OUTPATIENT_CLINIC_OR_DEPARTMENT_OTHER): Payer: BLUE CROSS/BLUE SHIELD

## 2015-03-05 ENCOUNTER — Encounter: Payer: Self-pay | Admitting: *Deleted

## 2015-03-05 VITALS — BP 127/74 | HR 71 | Temp 98.3°F | Resp 20

## 2015-03-05 DIAGNOSIS — C50411 Malignant neoplasm of upper-outer quadrant of right female breast: Secondary | ICD-10-CM | POA: Diagnosis not present

## 2015-03-05 DIAGNOSIS — Z5189 Encounter for other specified aftercare: Secondary | ICD-10-CM | POA: Diagnosis not present

## 2015-03-05 DIAGNOSIS — Z5111 Encounter for antineoplastic chemotherapy: Secondary | ICD-10-CM

## 2015-03-05 LAB — CBC WITH DIFFERENTIAL/PLATELET
BASO%: 0.7 % (ref 0.0–2.0)
BASOS ABS: 0 10*3/uL (ref 0.0–0.1)
EOS%: 1.9 % (ref 0.0–7.0)
Eosinophils Absolute: 0.1 10*3/uL (ref 0.0–0.5)
HEMATOCRIT: 38.5 % (ref 34.8–46.6)
HGB: 12.9 g/dL (ref 11.6–15.9)
LYMPH%: 31.5 % (ref 14.0–49.7)
MCH: 31.9 pg (ref 25.1–34.0)
MCHC: 33.4 g/dL (ref 31.5–36.0)
MCV: 95.6 fL (ref 79.5–101.0)
MONO#: 0.6 10*3/uL (ref 0.1–0.9)
MONO%: 9.5 % (ref 0.0–14.0)
NEUT%: 56.4 % (ref 38.4–76.8)
NEUTROS ABS: 3.6 10*3/uL (ref 1.5–6.5)
Platelets: 281 10*3/uL (ref 145–400)
RBC: 4.03 10*6/uL (ref 3.70–5.45)
RDW: 14 % (ref 11.2–14.5)
WBC: 6.3 10*3/uL (ref 3.9–10.3)
lymph#: 2 10*3/uL (ref 0.9–3.3)

## 2015-03-05 LAB — COMPREHENSIVE METABOLIC PANEL (CC13)
ALBUMIN: 3.8 g/dL (ref 3.5–5.0)
ALK PHOS: 66 U/L (ref 40–150)
ALT: 19 U/L (ref 0–55)
AST: 20 U/L (ref 5–34)
Anion Gap: 7 mEq/L (ref 3–11)
BUN: 12.5 mg/dL (ref 7.0–26.0)
CO2: 29 mEq/L (ref 22–29)
CREATININE: 0.8 mg/dL (ref 0.6–1.1)
Calcium: 9.4 mg/dL (ref 8.4–10.4)
Chloride: 104 mEq/L (ref 98–109)
EGFR: 85 mL/min/{1.73_m2} — AB (ref >90)
Glucose: 92 mg/dl (ref 70–140)
Potassium: 4.4 mEq/L (ref 3.5–5.1)
Sodium: 140 mEq/L (ref 136–145)
TOTAL PROTEIN: 6.8 g/dL (ref 6.4–8.3)
Total Bilirubin: 0.42 mg/dL (ref 0.20–1.20)

## 2015-03-05 MED ORDER — CYCLOPHOSPHAMIDE CHEMO INJECTION 1 GM
600.0000 mg/m2 | Freq: Once | INTRAMUSCULAR | Status: AC
  Administered 2015-03-05: 1060 mg via INTRAVENOUS
  Filled 2015-03-05: qty 53

## 2015-03-05 MED ORDER — DOXORUBICIN HCL CHEMO IV INJECTION 2 MG/ML
60.0000 mg/m2 | Freq: Once | INTRAVENOUS | Status: AC
  Administered 2015-03-05: 106 mg via INTRAVENOUS
  Filled 2015-03-05: qty 53

## 2015-03-05 MED ORDER — PALONOSETRON HCL INJECTION 0.25 MG/5ML
0.2500 mg | Freq: Once | INTRAVENOUS | Status: AC
  Administered 2015-03-05: 0.25 mg via INTRAVENOUS

## 2015-03-05 MED ORDER — HEPARIN SOD (PORK) LOCK FLUSH 100 UNIT/ML IV SOLN
500.0000 [IU] | Freq: Once | INTRAVENOUS | Status: AC | PRN
  Administered 2015-03-05: 500 [IU]
  Filled 2015-03-05: qty 5

## 2015-03-05 MED ORDER — SODIUM CHLORIDE 0.9 % IJ SOLN
10.0000 mL | INTRAMUSCULAR | Status: DC | PRN
  Administered 2015-03-05: 10 mL
  Filled 2015-03-05: qty 10

## 2015-03-05 MED ORDER — SODIUM CHLORIDE 0.9 % IV SOLN
Freq: Once | INTRAVENOUS | Status: AC
  Administered 2015-03-05: 12:00:00 via INTRAVENOUS
  Filled 2015-03-05: qty 5

## 2015-03-05 MED ORDER — SODIUM CHLORIDE 0.9 % IV SOLN
Freq: Once | INTRAVENOUS | Status: AC
  Administered 2015-03-05: 12:00:00 via INTRAVENOUS

## 2015-03-05 MED ORDER — PALONOSETRON HCL INJECTION 0.25 MG/5ML
INTRAVENOUS | Status: AC
  Filled 2015-03-05: qty 5

## 2015-03-05 MED ORDER — PEGFILGRASTIM 6 MG/0.6ML ~~LOC~~ PSKT
6.0000 mg | PREFILLED_SYRINGE | Freq: Once | SUBCUTANEOUS | Status: AC
Start: 2015-03-05 — End: 2015-03-05
  Administered 2015-03-05: 6 mg via SUBCUTANEOUS
  Filled 2015-03-05: qty 0.6

## 2015-03-05 NOTE — Progress Notes (Signed)
Met with pt during 1st chemo treatment. Relate doing well and without complaints. Encourage pt to call with questions or needs. Received verbal understanding.

## 2015-03-05 NOTE — Patient Instructions (Signed)
[facility] Discharge Instructions for Patients Receiving Chemotherapy  Today you received the following chemotherapy agents Adriamycin/Cytoxan  To help prevent nausea and vomiting after your treatment, we encourage you to take your nausea medication    If you develop nausea and vomiting that is not controlled by your nausea medication, call the clinic.   BELOW ARE SYMPTOMS THAT SHOULD BE REPORTED IMMEDIATELY:  *FEVER GREATER THAN 100.5 F  *CHILLS WITH OR WITHOUT FEVER  NAUSEA AND VOMITING THAT IS NOT CONTROLLED WITH YOUR NAUSEA MEDICATION  *UNUSUAL SHORTNESS OF BREATH  *UNUSUAL BRUISING OR BLEEDING  TENDERNESS IN MOUTH AND THROAT WITH OR WITHOUT PRESENCE OF ULCERS  *URINARY PROBLEMS  *BOWEL PROBLEMS  UNUSUAL RASH Items with * indicate a potential emergency and should be followed up as soon as possible.  Feel free to call the clinic you have any questions or concerns. The clinic phone number is [phone].  Please show the [facility] at check-in to the Emergency Department and triage nurse.   Cyclophosphamide injection What is this medicine? CYCLOPHOSPHAMIDE (sye kloe FOSS fa mide) is a chemotherapy drug. It slows the growth of cancer cells. This medicine is used to treat many types of cancer like lymphoma, myeloma, leukemia, breast cancer, and ovarian cancer, to name a few. This medicine may be used for other purposes; ask your health care provider or pharmacist if you have questions. COMMON BRAND NAME(S): Cytoxan, Neosar What should I tell my health care provider before I take this medicine? They need to know if you have any of these conditions: -blood disorders -history of other chemotherapy -infection -kidney disease -liver disease -recent or ongoing radiation therapy -tumors in the bone marrow -an unusual or allergic reaction to cyclophosphamide, other chemotherapy, other medicines, foods, dyes, or preservatives -pregnant or trying to get  pregnant -breast-feeding How should I use this medicine? This drug is usually given as an injection into a vein or muscle or by infusion into a vein. It is administered in a hospital or clinic by a specially trained health care professional. Talk to your pediatrician regarding the use of this medicine in children. Special care may be needed. Overdosage: If you think you have taken too much of this medicine contact a poison control center or emergency room at once. NOTE: This medicine is only for you. Do not share this medicine with others. What if I miss a dose? It is important not to miss your dose. Call your doctor or health care professional if you are unable to keep an appointment. What may interact with this medicine? This medicine may interact with the following medications: -amiodarone -amphotericin B -azathioprine -certain antiviral medicines for HIV or AIDS such as protease inhibitors (e.g., indinavir, ritonavir) and zidovudine -certain blood pressure medications such as benazepril, captopril, enalapril, fosinopril, lisinopril, moexipril, monopril, perindopril, quinapril, ramipril, trandolapril -certain cancer medications such as anthracyclines (e.g., daunorubicin, doxorubicin), busulfan, cytarabine, paclitaxel, pentostatin, tamoxifen, trastuzumab -certain diuretics such as chlorothiazide, chlorthalidone, hydrochlorothiazide, indapamide, metolazone -certain medicines that treat or prevent blood clots like warfarin -certain muscle relaxants such as succinylcholine -cyclosporine -etanercept -indomethacin -medicines to increase blood counts like filgrastim, pegfilgrastim, sargramostim -medicines used as general anesthesia -metronidazole -natalizumab This list may not describe all possible interactions. Give your health care provider a list of all the medicines, herbs, non-prescription drugs, or dietary supplements you use. Also tell them if you smoke, drink alcohol, or use illegal  drugs. Some items may interact with your medicine. What should I watch for while using  this medicine? Visit your doctor for checks on your progress. This drug may make you feel generally unwell. This is not uncommon, as chemotherapy can affect healthy cells as well as cancer cells. Report any side effects. Continue your course of treatment even though you feel ill unless your doctor tells you to stop. Drink water or other fluids as directed. Urinate often, even at night. In some cases, you may be given additional medicines to help with side effects. Follow all directions for their use. Call your doctor or health care professional for advice if you get a fever, chills or sore throat, or other symptoms of a cold or flu. Do not treat yourself. This drug decreases your body's ability to fight infections. Try to avoid being around people who are sick. This medicine may increase your risk to bruise or bleed. Call your doctor or health care professional if you notice any unusual bleeding. Be careful brushing and flossing your teeth or using a toothpick because you may get an infection or bleed more easily. If you have any dental work done, tell your dentist you are receiving this medicine. You may get drowsy or dizzy. Do not drive, use machinery, or do anything that needs mental alertness until you know how this medicine affects you. Do not become pregnant while taking this medicine or for 1 year after stopping it. Women should inform their doctor if they wish to become pregnant or think they might be pregnant. Men should not father a child while taking this medicine and for 4 months after stopping it. There is a potential for serious side effects to an unborn child. Talk to your health care professional or pharmacist for more information. Do not breast-feed an infant while taking this medicine. This medicine may interfere with the ability to have a child. This medicine has caused ovarian failure in some women.  This medicine has caused reduced sperm counts in some men. You should talk with your doctor or health care professional if you are concerned about your fertility. If you are going to have surgery, tell your doctor or health care professional that you have taken this medicine. What side effects may I notice from receiving this medicine? Side effects that you should report to your doctor or health care professional as soon as possible: -allergic reactions like skin rash, itching or hives, swelling of the face, lips, or tongue -low blood counts - this medicine may decrease the number of white blood cells, red blood cells and platelets. You may be at increased risk for infections and bleeding. -signs of infection - fever or chills, cough, sore throat, pain or difficulty passing urine -signs of decreased platelets or bleeding - bruising, pinpoint red spots on the skin, black, tarry stools, blood in the urine -signs of decreased red blood cells - unusually weak or tired, fainting spells, lightheadedness -breathing problems -dark urine -dizziness -palpitations -swelling of the ankles, feet, hands -trouble passing urine or change in the amount of urine -weight gain -yellowing of the eyes or skin Side effects that usually do not require medical attention (report to your doctor or health care professional if they continue or are bothersome): -changes in nail or skin color -hair loss -missed menstrual periods -mouth sores -nausea, vomiting This list may not describe all possible side effects. Call your doctor for medical advice about side effects. You may report side effects to FDA at 1-800-FDA-1088. Where should I keep my medicine? This drug is given in a hospital or clinic and  will not be stored at home. NOTE: This sheet is a summary. It may not cover all possible information. If you have questions about this medicine, talk to your doctor, pharmacist, or health care provider.  2015, [employer]  Standard. (2012-06-22 16:22:58)   Doxorubicin injection What is this medicine? DOXORUBICIN (dox oh ROO bi sin) is a chemotherapy drug. It is used to treat many kinds of cancer like Hodgkin's disease, leukemia, non-Hodgkin's lymphoma, neuroblastoma, sarcoma, and Wilms' tumor. It is also used to treat bladder cancer, breast cancer, lung cancer, ovarian cancer, stomach cancer, and thyroid cancer. This medicine may be used for other purposes; ask your health care provider or pharmacist if you have questions. COMMON BRAND NAME(S): Adriamycin, Adriamycin PFS, Adriamycin RDF, Rubex What should I tell my health care provider before I take this medicine? They need to know if you have any of these conditions: -blood disorders -heart disease, recent heart attack -infection (especially a virus infection such as chickenpox, cold sores, or herpes) -irregular heartbeat -liver disease -recent or ongoing radiation therapy -an unusual or allergic reaction to doxorubicin, other chemotherapy agents, other medicines, foods, dyes, or preservatives -pregnant or trying to get pregnant -breast-feeding How should I use this medicine? This drug is given as an infusion into a vein. It is administered in a hospital or clinic by a specially trained health care professional. If you have pain, swelling, burning or any unusual feeling around the site of your injection, tell your health care professional right away. Talk to your pediatrician regarding the use of this medicine in children. Special care may be needed. Overdosage: If you think you have taken too much of this medicine contact a poison control center or emergency room at once. NOTE: This medicine is only for you. Do not share this medicine with others. What if I miss a dose? It is important not to miss your dose. Call your doctor or health care professional if you are unable to keep an appointment. What may interact with this medicine? Do not take this medicine  with any of the following medications: -cisapride -droperidol -halofantrine -pimozide -zidovudine This medicine may also interact with the following medications: -chloroquine -chlorpromazine -clarithromycin -cyclophosphamide -cyclosporine -erythromycin -medicines for depression, anxiety, or psychotic disturbances -medicines for irregular heart beat like amiodarone, bepridil, dofetilide, encainide, flecainide, propafenone, quinidine -medicines for seizures like ethotoin, fosphenytoin, phenytoin -medicines for nausea, vomiting like dolasetron, ondansetron, palonosetron -medicines to increase blood counts like filgrastim, pegfilgrastim, sargramostim -methadone -methotrexate -pentamidine -progesterone -vaccines -verapamil Talk to your doctor or health care professional before taking any of these medicines: -acetaminophen -aspirin -ibuprofen -ketoprofen -naproxen This list may not describe all possible interactions. Give your health care provider a list of all the medicines, herbs, non-prescription drugs, or dietary supplements you use. Also tell them if you smoke, drink alcohol, or use illegal drugs. Some items may interact with your medicine. What should I watch for while using this medicine? Your condition will be monitored carefully while you are receiving this medicine. You will need important blood work done while you are taking this medicine. This drug may make you feel generally unwell. This is not uncommon, as chemotherapy can affect healthy cells as well as cancer cells. Report any side effects. Continue your course of treatment even though you feel ill unless your doctor tells you to stop. Your urine may turn red for a few days after your dose. This is not blood. If your urine is dark or brown, call your doctor. In some cases, you may  be given additional medicines to help with side effects. Follow all directions for their use. Call your doctor or health care professional for  advice if you get a fever, chills or sore throat, or other symptoms of a cold or flu. Do not treat yourself. This drug decreases your body's ability to fight infections. Try to avoid being around people who are sick. This medicine may increase your risk to bruise or bleed. Call your doctor or health care professional if you notice any unusual bleeding. Be careful brushing and flossing your teeth or using a toothpick because you may get an infection or bleed more easily. If you have any dental work done, tell your dentist you are receiving this medicine. Avoid taking products that contain aspirin, acetaminophen, ibuprofen, naproxen, or ketoprofen unless instructed by your doctor. These medicines may hide a fever. Men and women of childbearing age should use effective birth control methods while using taking this medicine. Do not become pregnant while taking this medicine. There is a potential for serious side effects to an unborn child. Talk to your health care professional or pharmacist for more information. Do not breast-feed an infant while taking this medicine. Do not let others touch your urine or other body fluids for 5 days after each treatment with this medicine. Caregivers should wear latex gloves to avoid touching body fluids during this time. There is a maximum amount of this medicine you should receive throughout your life. The amount depends on the medical condition being treated and your overall health. Your doctor will watch how much of this medicine you receive in your lifetime. Tell your doctor if you have taken this medicine before. What side effects may I notice from receiving this medicine? Side effects that you should report to your doctor or health care professional as soon as possible: -allergic reactions like skin rash, itching or hives, swelling of the face, lips, or tongue -low blood counts - this medicine may decrease the number of white blood cells, red blood cells and platelets.  You may be at increased risk for infections and bleeding. -signs of infection - fever or chills, cough, sore throat, pain or difficulty passing urine -signs of decreased platelets or bleeding - bruising, pinpoint red spots on the skin, black, tarry stools, blood in the urine -signs of decreased red blood cells - unusually weak or tired, fainting spells, lightheadedness -breathing problems -chest pain -fast, irregular heartbeat -mouth sores -nausea, vomiting -pain, swelling, redness at site where injected -pain, tingling, numbness in the hands or feet -swelling of ankles, feet, or hands -unusual bleeding or bruising Side effects that usually do not require medical attention (report to your doctor or health care professional if they continue or are bothersome): -diarrhea -facial flushing -hair loss -loss of appetite -missed menstrual periods -nail discoloration or damage -red or watery eyes -red colored urine -stomach upset This list may not describe all possible side effects. Call your doctor for medical advice about side effects. You may report side effects to FDA at 1-800-FDA-1088. Where should I keep my medicine? This drug is given in a hospital or clinic and will not be stored at home. NOTE: This sheet is a summary. It may not cover all possible information. If you have questions about this medicine, talk to your doctor, pharmacist, or health care provider.  2015, [employer]/Gold Standard. (2012-12-04 09:54:34)   Fosaprepitant injection What is this medicine? FOSAPREPITANT (fos ap RE pi tant) is used together with other medicines to prevent nausea and vomiting  caused by cancer treatment (chemotherapy). This medicine may be used for other purposes; ask your health care provider or pharmacist if you have questions. COMMON BRAND NAME(S): Emend What should I tell my health care provider before I take this medicine? They need to know if you have any of these conditions: -liver  disease -an unusual or allergic reaction to fosaprepitant, aprepitant, medicines, foods, dyes, or preservatives -pregnant or trying to get pregnant -breast-feeding How should I use this medicine? This medicine is for injection into a vein. It is given by a health care professional in a hospital or clinic setting. Talk to your pediatrician regarding the use of this medicine in children. Special care may be needed. Overdosage: If you think you have taken too much of this medicine contact a poison control center or emergency room at once. NOTE: This medicine is only for you. Do not share this medicine with others. What if I miss a dose? This does not apply. What may interact with this medicine? Do not take this medicine with any of these medicines: -cisapride -pimozide -ranolazine This medicine may also interact with the following medications: -diltiazem -female hormones, like estrogens or progestins and birth control pills -medicines for fungal infections like ketoconazole and itraconazole -medicines for HIV -medicines for seizures or to control epilepsy like carbamazepine or phenytoin -medicines used for sleep or anxiety disorders like alprazolam, diazepam, or midazolam -nefazodone -paroxetine -rifampin -some chemotherapy medications like etoposide, ifosfamide, vinblastine, vincristine -some antibiotics like clarithromycin, erythromycin, troleandomycin -steroid medicines like dexamethasone or methylprednisolone -tolbutamide -warfarin This list may not describe all possible interactions. Give your health care provider a list of all the medicines, herbs, non-prescription drugs, or dietary supplements you use. Also tell them if you smoke, drink alcohol, or use illegal drugs. Some items may interact with your medicine. What should I watch for while using this medicine? Do not take this medicine if you already have nausea and vomiting. Ask your health care provider what to do if you  already have nausea. Birth control pills may not work properly while you are taking this medicine. Talk to your doctor about using an extra method of birth control. This medicine should not be used continuously for a long time. Visit your doctor or health care professional for regular check-ups. This medicine may change your liver function blood test results. What side effects may I notice from receiving this medicine? Side effects that you should report to your doctor or health care professional as soon as possible: -allergic reactions like skin rash, itching or hives, swelling of the face, lips, or tongue -breathing problems -changes in heart rhythm -high or low blood pressure -pain, redness, or irritation at site where injected -rectal bleeding -serious dizziness or disorientation, confusion -sharp or severe stomach pain -sharp pain in your leg Side effects that usually do not require medical attention (report to your doctor or health care professional if they continue or are bothersome): -constipation or diarrhea -hair loss -headache -hiccups -loss of appetite -nausea -upset stomach -tiredness This list may not describe all possible side effects. Call your doctor for medical advice about side effects. You may report side effects to FDA at 1-800-FDA-1088. Where should I keep my medicine? This drug is given in a hospital or clinic and will not be stored at home. NOTE: This sheet is a summary. It may not cover all possible information. If you have questions about this medicine, talk to your doctor, pharmacist, or health care provider.  2015, [employer]/Gold Standard. (2009-07-21 12:46:13)  Dexamethasone injection What is this medicine? DEXAMETHASONE (dex a METH a sone) is a corticosteroid. It is used to treat inflammation of the skin, joints, lungs, and other organs. Common conditions treated include asthma, allergies, and arthritis. It is also used for other conditions, like blood  disorders and diseases of the adrenal glands. This medicine may be used for other purposes; ask your health care provider or pharmacist if you have questions. COMMON BRAND NAME(S): Decadron, Solurex What should I tell my health care provider before I take this medicine? They need to know if you have any of these conditions: -blood clotting problems -Cushing's syndrome -diabetes -glaucoma -heart problems or disease -high blood pressure -infection like herpes, measles, tuberculosis, or chickenpox -kidney disease -liver disease -mental problems -myasthenia gravis -osteoporosis -previous heart attack -seizures -stomach, ulcer or intestine disease including colitis and diverticulitis -thyroid problem -an unusual or allergic reaction to dexamethasone, corticosteroids, other medicines, lactose, foods, dyes, or preservatives -pregnant or trying to get pregnant -breast-feeding How should I use this medicine? This medicine is for injection into a muscle, joint, lesion, soft tissue, or vein. It is given by a health care professional in a hospital or clinic setting. Talk to your pediatrician regarding the use of this medicine in children. Special care may be needed. Overdosage: If you think you have taken too much of this medicine contact a poison control center or emergency room at once. NOTE: This medicine is only for you. Do not share this medicine with others. What if I miss a dose? This may not apply. If you are having a series of injections over a prolonged period, try not to miss an appointment. Call your doctor or health care professional to reschedule if you are unable to keep an appointment. What may interact with this medicine? Do not take this medicine with any of the following medications: -mifepristone, RU-486 -vaccines This medicine may also interact with the following medications: -amphotericin B -antibiotics like clarithromycin, erythromycin, and troleandomycin -aspirin and  aspirin-like drugs -barbiturates like phenobarbital -carbamazepine -cholestyramine -cholinesterase inhibitors like donepezil, galantamine, rivastigmine, and tacrine -cyclosporine -digoxin -diuretics -ephedrine -female hormones, like estrogens or progestins and birth control pills -indinavir -isoniazid -ketoconazole -medicines for diabetes -medicines that improve muscle tone or strength for conditions like myasthenia gravis -NSAIDs, medicines for pain and inflammation, like ibuprofen or naproxen -phenytoin -rifampin -thalidomide -warfarin This list may not describe all possible interactions. Give your health care provider a list of all the medicines, herbs, non-prescription drugs, or dietary supplements you use. Also tell them if you smoke, drink alcohol, or use illegal drugs. Some items may interact with your medicine. What should I watch for while using this medicine? Your condition will be monitored carefully while you are receiving this medicine. If you are taking this medicine for a long time, carry an identification card with your name and address, the type and dose of your medicine, and your doctor's name and address. This medicine may increase your risk of getting an infection. Stay away from people who are sick. Tell your doctor or health care professional if you are around anyone with measles or chickenpox. Talk to your health care provider before you get any vaccines that you take this medicine. If you are going to have surgery, tell your doctor or health care professional that you have taken this medicine within the last twelve months. Ask your doctor or health care professional about your diet. You may need to lower the amount of salt you eat. The medicine can increase  your blood sugar. If you are a diabetic check with your doctor if you need help adjusting the dose of your diabetic medicine. What side effects may I notice from receiving this medicine? Side effects that you  should report to your doctor or health care professional as soon as possible: -allergic reactions like skin rash, itching or hives, swelling of the face, lips, or tongue -black or tarry stools -change in the amount of urine -changes in vision -confusion, excitement, restlessness, a false sense of well-being -fever, sore throat, sneezing, cough, or other signs of infection, wounds that will not heal -hallucinations -increased thirst -mental depression, mood swings, mistaken feelings of self importance or of being mistreated -pain in hips, back, ribs, arms, shoulders, or legs -pain, redness, or irritation at the injection site -redness, blistering, peeling or loosening of the skin, including inside the mouth -rounding out of face -swelling of feet or lower legs -unusual bleeding or bruising -unusual tired or weak -wounds that do not heal Side effects that usually do not require medical attention (report to your doctor or health care professional if they continue or are bothersome): -diarrhea or constipation -change in taste -headache -nausea, vomiting -skin problems, acne, thin and shiny skin -touble sleeping -unusual growth of hair on the face or body -weight gain This list may not describe all possible side effects. Call your doctor for medical advice about side effects. You may report side effects to FDA at 1-800-FDA-1088. Where should I keep my medicine? This drug is given in a hospital or clinic and will not be stored at home. NOTE: This sheet is a summary. It may not cover all possible information. If you have questions about this medicine, talk to your doctor, pharmacist, or health care provider.  2015, Elsevier/Gold Standard. (2007-11-29 14:04:12)  Ondansetron injection What is this medicine? ONDANSETRON (on DAN se tron) is used to treat nausea and vomiting caused by chemotherapy. It is also used to prevent or treat nausea and vomiting after surgery. This medicine may be  used for other purposes; ask your health care provider or pharmacist if you have questions. COMMON BRAND NAME(S): Zofran What should I tell my health care provider before I take this medicine? They need to know if you have any of these conditions: -heart disease -history of irregular heartbeat -liver disease -low levels of magnesium or potassium in the blood -an unusual or allergic reaction to ondansetron, granisetron, other medicines, foods, dyes, or preservatives -pregnant or trying to get pregnant -breast-feeding How should I use this medicine? This medicine is for infusion into a vein. It is given by a health care professional in a hospital or clinic setting. Talk to your pediatrician regarding the use of this medicine in children. Special care may be needed. Overdosage: If you think you have taken too much of this medicine contact a poison control center or emergency room at once. NOTE: This medicine is only for you. Do not share this medicine with others. What if I miss a dose? This does not apply. What may interact with this medicine? Do not take this medicine with any of the following medications: -apomorphine -certain medicines for fungal infections like fluconazole, itraconazole, ketoconazole, posaconazole, voriconazole -cisapride -dofetilide -dronedarone -pimozide -thioridazine -ziprasidone This medicine may also interact with the following medications: -carbamazepine -certain medicines for depression, anxiety, or psychotic disturbances -fentanyl -linezolid -MAOIs like Carbex, Eldepryl, Marplan, Nardil, and Parnate -methylene blue (injected into a vein) -other medicines that prolong the QT interval (cause an abnormal heart rhythm) -phenytoin -  rifampicin -tramadol This list may not describe all possible interactions. Give your health care provider a list of all the medicines, herbs, non-prescription drugs, or dietary supplements you use. Also tell them if you smoke,  drink alcohol, or use illegal drugs. Some items may interact with your medicine. What should I watch for while using this medicine? Your condition will be monitored carefully while you are receiving this medicine. What side effects may I notice from receiving this medicine? Side effects that you should report to your doctor or health care professional as soon as possible: -allergic reactions like skin rash, itching or hives, swelling of the face, lips, or tongue -breathing problems -confusion -dizziness -fast or irregular heartbeat -feeling faint or lightheaded, falls -fever and chills -loss of balance or coordination -seizures -sweating -swelling of the hands and feet -tightness in the chest -tremors -unusually weak or tired Side effects that usually do not require medical attention (report to your doctor or health care professional if they continue or are bothersome): -constipation or diarrhea -headache This list may not describe all possible side effects. Call your doctor for medical advice about side effects. You may report side effects to FDA at 1-800-FDA-1088. Where should I keep my medicine? This drug is given in a hospital or clinic and will not be stored at home. NOTE: This sheet is a summary. It may not cover all possible information. If you have questions about this medicine, talk to your doctor, pharmacist, or health care provider.  2015, Elsevier/Gold Standard. (2013-05-15 16:18:28)

## 2015-03-06 ENCOUNTER — Telehealth: Payer: Self-pay | Admitting: *Deleted

## 2015-03-06 NOTE — Telephone Encounter (Signed)
Left message on patient's voice mail with chemo follow up call. To call us back with any questions or concerns

## 2015-03-06 NOTE — Telephone Encounter (Signed)
-----   Message from Azzie Glatter, RN sent at 03/05/2015  1:52 PM EDT ----- Regarding: "1st time chemotherapy" Patient received Adriamycin and Cytoxan for the first time today, per Dr. Jana Hakim.  Tolerated treatment well with no complaints.

## 2015-03-07 ENCOUNTER — Ambulatory Visit: Payer: BLUE CROSS/BLUE SHIELD

## 2015-03-11 ENCOUNTER — Telehealth: Payer: Self-pay | Admitting: Medical Oncology

## 2015-03-11 ENCOUNTER — Other Ambulatory Visit: Payer: Self-pay | Admitting: *Deleted

## 2015-03-11 NOTE — Telephone Encounter (Addendum)
BMs were normal prior to chemo . Day after chemo last thurs she was constipated for 6 days  through last night . During  those days she took MOM, mag citrate and miralax. She starting having diarrhea , "watery"  all last night and feels like she is "cleaned out , but stomach feels bloated and distended. When her stomach is empty she stated it "burns like acid and need something to calm it down"  ( she used to take Prilosec). I told her it may be from premeds and will notify Dr Jana Hakim of her symptoms. She would like a call back.

## 2015-03-11 NOTE — Progress Notes (Signed)
This RN spoke with pt post call to [place].   Bonnie Ward was able to further note pain at present is more intestinal vs stomach.  This RN discussed use of probiotics and gas X related to intestinal discomfort.  Stomach discomfort is more noted " when my stomach is empty "  Per discussion Prilosec causes constipation ( she has been taking it for several days due to discomfort )  Bonnie Ward will use Pepcid instead as well as follow a BRAT diet for the next few days.  Pt understands to call if the above is not relieved.  She is scheduled to be seen on 7/22 and concerns with upcoming chemo and potential above recurring can be discussed at that time.

## 2015-03-12 ENCOUNTER — Encounter: Payer: Self-pay | Admitting: Gastroenterology

## 2015-03-13 ENCOUNTER — Encounter: Payer: Self-pay | Admitting: *Deleted

## 2015-03-13 ENCOUNTER — Encounter: Payer: Self-pay | Admitting: Nurse Practitioner

## 2015-03-13 ENCOUNTER — Other Ambulatory Visit (HOSPITAL_BASED_OUTPATIENT_CLINIC_OR_DEPARTMENT_OTHER): Payer: BLUE CROSS/BLUE SHIELD

## 2015-03-13 ENCOUNTER — Ambulatory Visit (HOSPITAL_BASED_OUTPATIENT_CLINIC_OR_DEPARTMENT_OTHER): Payer: BLUE CROSS/BLUE SHIELD | Admitting: Nurse Practitioner

## 2015-03-13 VITALS — BP 119/70 | HR 93 | Temp 98.0°F | Resp 18 | Ht 66.0 in | Wt 149.0 lb

## 2015-03-13 DIAGNOSIS — Z170 Estrogen receptor positive status [ER+]: Secondary | ICD-10-CM | POA: Diagnosis not present

## 2015-03-13 DIAGNOSIS — R12 Heartburn: Secondary | ICD-10-CM | POA: Diagnosis not present

## 2015-03-13 DIAGNOSIS — C50411 Malignant neoplasm of upper-outer quadrant of right female breast: Secondary | ICD-10-CM | POA: Diagnosis not present

## 2015-03-13 DIAGNOSIS — K5900 Constipation, unspecified: Secondary | ICD-10-CM

## 2015-03-13 LAB — COMPREHENSIVE METABOLIC PANEL (CC13)
ALT: 14 U/L (ref 0–55)
AST: 15 U/L (ref 5–34)
Albumin: 3.7 g/dL (ref 3.5–5.0)
Alkaline Phosphatase: 90 U/L (ref 40–150)
Anion Gap: 8 mEq/L (ref 3–11)
BILIRUBIN TOTAL: 0.29 mg/dL (ref 0.20–1.20)
BUN: 13.8 mg/dL (ref 7.0–26.0)
CO2: 29 meq/L (ref 22–29)
CREATININE: 0.8 mg/dL (ref 0.6–1.1)
Calcium: 9.6 mg/dL (ref 8.4–10.4)
Chloride: 101 mEq/L (ref 98–109)
EGFR: 85 mL/min/{1.73_m2} — ABNORMAL LOW (ref >90)
Glucose: 111 mg/dl (ref 70–140)
POTASSIUM: 4.3 meq/L (ref 3.5–5.1)
Sodium: 138 mEq/L (ref 136–145)
Total Protein: 6.8 g/dL (ref 6.4–8.3)

## 2015-03-13 LAB — CBC WITH DIFFERENTIAL/PLATELET
BASO%: 0.4 % (ref 0.0–2.0)
Basophils Absolute: 0 10*3/uL (ref 0.0–0.1)
EOS%: 2.8 % (ref 0.0–7.0)
Eosinophils Absolute: 0.1 10*3/uL (ref 0.0–0.5)
HEMATOCRIT: 35.8 % (ref 34.8–46.6)
HGB: 12.3 g/dL (ref 11.6–15.9)
LYMPH#: 2 10*3/uL (ref 0.9–3.3)
LYMPH%: 39.8 % (ref 14.0–49.7)
MCH: 32.3 pg (ref 25.1–34.0)
MCHC: 34.4 g/dL (ref 31.5–36.0)
MCV: 94 fL (ref 79.5–101.0)
MONO#: 0.6 10*3/uL (ref 0.1–0.9)
MONO%: 12.6 % (ref 0.0–14.0)
NEUT#: 2.2 10*3/uL (ref 1.5–6.5)
NEUT%: 44.4 % (ref 38.4–76.8)
Platelets: 150 10*3/uL (ref 145–400)
RBC: 3.81 10*6/uL (ref 3.70–5.45)
RDW: 13.3 % (ref 11.2–14.5)
WBC: 5 10*3/uL (ref 3.9–10.3)

## 2015-03-13 MED ORDER — SUCRALFATE 1 GM/10ML PO SUSP: 1.0000 g | Freq: Three times a day (TID) | ORAL | Status: DC

## 2015-03-13 NOTE — Progress Notes (Signed)
[facility]  [phone] [phone]     ID: Lillie Columbia DOB: [birth year]  MR#: [id number]  HWE#:[id number]  Patient Care Team: Lollie Sails, MD as PCP - General (Internal Medicine) Milford Cage, OTR Kem Boroughs, FNP as Nurse Practitioner (Nurse Practitioner) Fanny Skates, MD as Consulting Physician (General Surgery) Chauncey Cruel, MD as Consulting Physician (Oncology) Thea Silversmith, MD as Consulting Physician (Radiation Oncology) Rockwell Germany, RN as Registered Nurse Mauro Kaufmann, RN as Registered Nurse Holley Bouche, NP as Nurse Practitioner (Nurse Practitioner) Kristeen Miss, MD as Consulting Physician (Neurosurgery) PCP: Lollie Sails, MD OTHER MD:  CHIEF COMPLAINT: Estrogen receptor positive breast cancer  CURRENT TREATMENT: adjuvant chemotherapy   BREAST CANCER HISTORY: From the initial intake note:   Kae had routine bilateral screening mammography at the [facility] for [facility] 01/09/2015 showing a possible mass in the right breast. On 12/22/2014 she underwent diagnostic right mammography with tomosynthesis and right breast ultrasonography. The breast density was category C. In the right breast there was an area of increased density with architectural distortion and faint microcalcifications in the upper outer quadrant. There was mild palpable soft tissue thickening in the area in question, but no palpable right axillary lymph nodes. Ultrasound confirmed a 1.7 cm irregular hypoechoic mass. There were no abnormal-appearing right axillary lymph nodes.  Biopsy of the right breast mass in question 12/26/2014 showed ([facility] 16-08/08/2005) an invasive ductal carcinoma, grade 1 or 2, with a prognostic panel still pending.  Her subsequent history is as detailed below  INTERVAL HISTORY: Janelli returns today for follow-up of her breast cancer. Today is day 9, cycle 1 of doxorubicin and cyclophosphamide, with neulasta  given on day 2 for granulocyte support. Her main complaint today is intense heartburn. She tried omeprazole and TUMS, but they were ineffective. She claims her entire throat and esophagus feel raw, and her stomach has a gnawing pain.   REVIEW OF SYSTEMS: Tyffany denies fevers or chills. She experienced nausea, but managed it well with her PRN antiemetics. She had constipation x 6 days, then took MOM, magnesium sulfate, and miralax, which caused watery diarrhea. Her bowels are back to normal today. She denies mouth sores or rashes. She had 1 migraine and used her imitrex. She used advil for her bone aches caused by the neulasta. She is already having some vision changes. A detailed review of systems is otherwise stable.  PAST MEDICAL HISTORY: Past Medical History  Diagnosis Date  . ACID REFLUX DISEASE 07/12/2010  . Arachnoid cyst     left frontal lobe  . Migraines   . Arthritis     hands  . Nervous stomach   . Difficulty swallowing pills   . Hypothyroidism   . Stress fracture of foot 12/2014    right  . Glaucoma   . Breast cancer 12/2014    right  . Anxiety   . Dental crowns present   . Seasonal allergies   . Complication of anesthesia 2012    states woke up during colonoscopy with Propofol    PAST SURGICAL HISTORY: Past Surgical History  Procedure Laterality Date  . Tonsillectomy  age 5  . Esophagogastroduodenoscopy endoscopy    . Tooth extraction    . Lasik    . Colonoscopy with propofol  10/14/2013  . Radioactive seed guided mastectomy with axillary sentinel lymph node biopsy Right 01/23/2015    Procedure: RIGHT PARTIAL MASTECTOMY WITH RADIOACTIVE SEED LOCALIZATION  WITH RIGHT AXILLARY SENTINEL LYMPH NODE BIOPSY;  Surgeon:  Fanny Skates, MD;  Location: [facility];  Service: General;  Laterality: Right;  . Portacath placement Left 01/23/2015    Procedure: INSERTION PORT-A-CATH ;  Surgeon: Fanny Skates, MD;  Location: [facility];  Service: General;   Laterality: Left;  . Re-excision of breast lumpectomy Right 02/16/2015    Procedure: RE-EXCISION OF RIGHT  BREAST LUMPECTOMY MARGINS;  Surgeon: Fanny Skates, MD;  Location: [facility];  Service: General;  Laterality: Right;    FAMILY HISTORY Family History  Problem Relation Age of Onset  . Osteoporosis Mother   . Irritable bowel syndrome Mother   . Hypertension Mother   . Hypertension Father   . Hyperlipidemia Father   . Diabetes Father   . Dementia Father   . Colon cancer Maternal Grandfather 47  . Breast cancer Paternal Aunt   . Breast cancer Paternal Grandmother    the patient's father died at age 41 from pneumonia. [name] died at age 110 after bowel perforation. The patient had one brother, one sister. There is no history of breast or ovarian cancer in the family.  GYNECOLOGIC HISTORY:  Patient's last menstrual period was 01/25/2011. Menarche age 54, the patient is GX P0. She stopped having periods approximately 2011. She is still on hormone replacement, but is "trickling off it". She did take birth control pills for approximately 3 years remotely with no complications  SOCIAL HISTORY:  Allanna works as a Haematologist. She is divorced and lives by herself, with 2 dogs.    ADVANCED DIRECTIVES: The patient's brother [name] is her healthcare power of attorney. He may be reached in [place] at [place]: History  Substance Use Topics  . Smoking status: Former Smoker -- 0.00 packs/day for 0 years    Quit date: 02/19/2005  . Smokeless tobacco: Never Used  . Alcohol Use: Yes     Comment: occasionally     Colonoscopy: February 2016  PAP: June 2015  Bone density: October 2015  Lipid panel:  Allergies  Allergen Reactions  . Lortab [Hydrocodone-Acetaminophen] Anxiety  . Milk-Related Compounds Diarrhea    GI UPSET    Current Outpatient Prescriptions  Medication Sig Dispense Refill  . ALPRAZolam (XANAX) 0.25 MG  tablet Take 0.25 mg by mouth at bedtime as needed for anxiety. 1/2 tab. daily    . bimatoprost (LUMIGAN) 0.03 % ophthalmic solution Place 1 drop into both eyes at bedtime.    Marland Kitchen dexamethasone (DECADRON) 4 MG tablet Take 2 tablets by mouth once a day on the day after chemotherapy and then take 2 tablets two times a day for 2 days. Take with food. 30 tablet 1  . lidocaine-prilocaine (EMLA) cream Apply to affected area once 30 g 3  . liothyronine (CYTOMEL) 5 MCG tablet Take 5 mcg by mouth 2 (two) times daily.    Marland Kitchen LORazepam (ATIVAN) 0.5 MG tablet Take 1 tablet (0.5 mg total) by mouth at bedtime. 30 tablet 0  . prochlorperazine (COMPAZINE) 10 MG tablet Take 1 tablet (10 mg total) by mouth every 6 (six) hours as needed (Nausea or vomiting). 30 tablet 1  . SUMAtriptan (IMITREX) 50 MG tablet TAKE 1 TABLET BY MOUTH AT THE ONSET OF HEADACHE. TAKE NO MORE THAN 4 TABLETS BY MOUTH IN 24 HOURS 9 tablet 5  . acetaminophen (TYLENOL) 325 MG tablet Take 650 mg by mouth every 6 (six) hours as needed.    . ondansetron (ZOFRAN) 8 MG tablet Take 1 tablet (8 mg  total) by mouth 2 (two) times daily as needed. Start on the third day after chemotherapy. (Patient not taking: Reported on 03/13/2015) 30 tablet 1  . sucralfate (CARAFATE) 1 GM/10ML suspension Take 10 mLs (1 g total) by mouth 4 (four) times daily -  with meals and at bedtime. 420 mL 0   No current facility-administered medications for this visit.    OBJECTIVE: Middle-aged white woman who appears well Filed Vitals:   03/13/15 1358  BP: 119/70  Pulse: 93  Temp: 98 F (36.7 C)  Resp: 18     Body mass index is 24.06 kg/(m^2).    ECOG FS:0 - Asymptomatic  Skin: warm, dry  HEENT: sclerae anicteric, conjunctivae pink, oropharynx clear. No thrush or mucositis.  Lymph Nodes: No cervical or supraclavicular lymphadenopathy  Lungs: clear to auscultation bilaterally, no rales, wheezes, or rhonci  Heart: regular rate and rhythm  Abdomen: round, soft, non tender,  positive bowel sounds  Musculoskeletal: No focal spinal tenderness, no peripheral edema  Neuro: non focal, well oriented, positive affect  Breasts: deferred  LAB RESULTS:  CMP     Component Value Date/Time   NA 138 03/13/2015 1348   NA 140 01/21/2009 0828   K 4.3 03/13/2015 1348   K 4.1 01/21/2009 0828   CL 108 01/21/2009 0828   CO2 29 03/13/2015 1348   CO2 30 01/21/2009 0828   GLUCOSE 111 03/13/2015 1348   GLUCOSE 90 01/21/2009 0828   BUN 13.8 03/13/2015 1348   BUN 13 01/21/2009 0828   CREATININE 0.8 03/13/2015 1348   CREATININE 0.7 01/21/2009 0828   CALCIUM 9.6 03/13/2015 1348   CALCIUM 8.5 01/21/2009 0828   PROT 6.8 03/13/2015 1348   PROT 6.9 01/21/2009 0828   ALBUMIN 3.7 03/13/2015 1348   ALBUMIN 3.7 01/21/2009 0828   AST 15 03/13/2015 1348   AST 22 01/21/2009 0828   ALT 14 03/13/2015 1348   ALT 19 01/21/2009 0828   ALKPHOS 90 03/13/2015 1348   ALKPHOS 43 01/21/2009 0828   BILITOT 0.29 03/13/2015 1348   BILITOT 1.1 01/21/2009 0828   GFRNONAA 95.01 01/21/2009 0828    INo results found for: SPEP, UPEP  Lab Results  Component Value Date   WBC 5.0 03/13/2015   NEUTROABS 2.2 03/13/2015   HGB 12.3 03/13/2015   HCT 35.8 03/13/2015   MCV 94.0 03/13/2015   PLT 150 03/13/2015      Chemistry      Component Value Date/Time   NA 138 03/13/2015 1348   NA 140 01/21/2009 0828   K 4.3 03/13/2015 1348   K 4.1 01/21/2009 0828   CL 108 01/21/2009 0828   CO2 29 03/13/2015 1348   CO2 30 01/21/2009 0828   BUN 13.8 03/13/2015 1348   BUN 13 01/21/2009 0828   CREATININE 0.8 03/13/2015 1348   CREATININE 0.7 01/21/2009 0828      Component Value Date/Time   CALCIUM 9.6 03/13/2015 1348   CALCIUM 8.5 01/21/2009 0828   ALKPHOS 90 03/13/2015 1348   ALKPHOS 43 01/21/2009 0828   AST 15 03/13/2015 1348   AST 22 01/21/2009 0828   ALT 14 03/13/2015 1348   ALT 19 01/21/2009 0828   BILITOT 0.29 03/13/2015 1348   BILITOT 1.1 01/21/2009 0828       No results found for:  LABCA2  No components found for: LABCA125  No results for input(s): INR in the last 168 hours.  Urinalysis    Component Value Date/Time   COLORURINE yellow 10/05/2009 1454  APPEARANCEUR Clear 10/05/2009 1454   LABSPEC 1.010 10/05/2009 1454   PHURINE 7.0 10/05/2009 1454   HGBUR 2+ 10/05/2009 1454   BILIRUBINUR negative 04/07/2014 1605   BILIRUBINUR negative 10/05/2009 1454   PROTEINUR negative 04/07/2014 1605   UROBILINOGEN negative 04/07/2014 1605   UROBILINOGEN 0.2 10/05/2009 1454   NITRITE negative 04/07/2014 1605   NITRITE negative 10/05/2009 1454   LEUKOCYTESUR Negative 04/07/2014 1605    STUDIES: No results found.  ASSESSMENT: 54 y.o. [place] woman status post right breast biopsy 12/26/2014 for a clinical T1 cN0, stage IA invasive ductal carcinoma, grade 1 or 2, estrogen and progesterone receptor positive, with an MIB-1 of 11%, and HER-2 amplified, with a signals ratio of 2.07, number per cell 2.28.  (1) status post right lumpectomy and sentinel lymph node sampling 01/23/2015 for a pT1c pN1, stage IIA invasive ductal carcinoma, grade 1, with focally positive anterior margins  (a) additional surgery for margin clearance 02/16/2015 followed residual disease  (2) chemotherapy will  start 03/05/2015, to consist of doxorubicin and cyclophosphamide in dose dense fashion 4 followed by paclitaxel weekly,  with trastuzumab and pertuzumab given every 3 weeks   (3) trastuzumab will be continued to complete 1 year  (a) echocardiogram 02/03/2015 shows a normal ejection fraction  (4) adjuvant radiation to follow chemotherapy  (5) anti-estrogens to follow radiation  PLAN: Shahla managed treatment reasonably well, but I think it will work out better with a few adjustments. I sent in a prescription for carafate suspension for her to use QID for her heartburn. We decided on the liquid version, as the coating feature may soothe her esophagus on the way down. She is uninterested in  more omeprazole because she says it makes her more constipated. To combat the constipation that she is expecting to begin with the second cycle, she will start her stool softeners and miralax the day before treatment.   Tyna will return in 1 week for labs and cycle 2 of treatment. She understands and agrees with this plan. She knows the goal of treatment in her case is cure. She has been encouraged to call with any issues that might arise before her next visit here.  Laurie Panda, NP   03/13/2015 2:38 PM

## 2015-03-19 ENCOUNTER — Ambulatory Visit (HOSPITAL_BASED_OUTPATIENT_CLINIC_OR_DEPARTMENT_OTHER): Payer: BLUE CROSS/BLUE SHIELD

## 2015-03-19 ENCOUNTER — Ambulatory Visit (HOSPITAL_BASED_OUTPATIENT_CLINIC_OR_DEPARTMENT_OTHER): Payer: BLUE CROSS/BLUE SHIELD | Admitting: Nurse Practitioner

## 2015-03-19 ENCOUNTER — Encounter: Payer: Self-pay | Admitting: Nurse Practitioner

## 2015-03-19 ENCOUNTER — Other Ambulatory Visit: Payer: Self-pay | Admitting: Nurse Practitioner

## 2015-03-19 ENCOUNTER — Encounter: Payer: Self-pay | Admitting: *Deleted

## 2015-03-19 ENCOUNTER — Other Ambulatory Visit (HOSPITAL_BASED_OUTPATIENT_CLINIC_OR_DEPARTMENT_OTHER): Payer: BLUE CROSS/BLUE SHIELD

## 2015-03-19 VITALS — BP 127/82 | HR 83 | Temp 98.0°F | Resp 18 | Wt 151.7 lb

## 2015-03-19 DIAGNOSIS — C50411 Malignant neoplasm of upper-outer quadrant of right female breast: Secondary | ICD-10-CM

## 2015-03-19 DIAGNOSIS — Z170 Estrogen receptor positive status [ER+]: Secondary | ICD-10-CM

## 2015-03-19 DIAGNOSIS — K5900 Constipation, unspecified: Secondary | ICD-10-CM | POA: Diagnosis not present

## 2015-03-19 DIAGNOSIS — Z5111 Encounter for antineoplastic chemotherapy: Secondary | ICD-10-CM

## 2015-03-19 DIAGNOSIS — Z5189 Encounter for other specified aftercare: Secondary | ICD-10-CM | POA: Diagnosis not present

## 2015-03-19 DIAGNOSIS — R12 Heartburn: Secondary | ICD-10-CM

## 2015-03-19 LAB — CBC WITH DIFFERENTIAL/PLATELET
BASO%: 0.6 % (ref 0.0–2.0)
BASOS ABS: 0.1 10*3/uL (ref 0.0–0.1)
EOS%: 0.3 % (ref 0.0–7.0)
Eosinophils Absolute: 0 10*3/uL (ref 0.0–0.5)
HCT: 36.1 % (ref 34.8–46.6)
HGB: 12.1 g/dL (ref 11.6–15.9)
LYMPH%: 16.8 % (ref 14.0–49.7)
MCH: 31.8 pg (ref 25.1–34.0)
MCHC: 33.4 g/dL (ref 31.5–36.0)
MCV: 95.1 fL (ref 79.5–101.0)
MONO#: 0.9 10*3/uL (ref 0.1–0.9)
MONO%: 7.4 % (ref 0.0–14.0)
NEUT#: 9.1 10*3/uL — ABNORMAL HIGH (ref 1.5–6.5)
NEUT%: 74.9 % (ref 38.4–76.8)
Platelets: 284 10*3/uL (ref 145–400)
RBC: 3.79 10*6/uL (ref 3.70–5.45)
RDW: 13.6 % (ref 11.2–14.5)
WBC: 12.2 10*3/uL — ABNORMAL HIGH (ref 3.9–10.3)
lymph#: 2 10*3/uL (ref 0.9–3.3)

## 2015-03-19 LAB — COMPREHENSIVE METABOLIC PANEL (CC13)
ALK PHOS: 103 U/L (ref 40–150)
ALT: 19 U/L (ref 0–55)
AST: 21 U/L (ref 5–34)
Albumin: 3.7 g/dL (ref 3.5–5.0)
Anion Gap: 5 mEq/L (ref 3–11)
BUN: 9.8 mg/dL (ref 7.0–26.0)
CALCIUM: 9 mg/dL (ref 8.4–10.4)
CHLORIDE: 106 meq/L (ref 98–109)
CO2: 30 mEq/L — ABNORMAL HIGH (ref 22–29)
CREATININE: 0.7 mg/dL (ref 0.6–1.1)
GLUCOSE: 100 mg/dL (ref 70–140)
Potassium: 4.1 mEq/L (ref 3.5–5.1)
Sodium: 141 mEq/L (ref 136–145)
Total Bilirubin: 0.2 mg/dL (ref 0.20–1.20)
Total Protein: 6.8 g/dL (ref 6.4–8.3)

## 2015-03-19 MED ORDER — HEPARIN SOD (PORK) LOCK FLUSH 100 UNIT/ML IV SOLN
500.0000 [IU] | Freq: Once | INTRAVENOUS | Status: AC | PRN
  Administered 2015-03-19: 500 [IU]
  Filled 2015-03-19: qty 5

## 2015-03-19 MED ORDER — SODIUM CHLORIDE 0.9 % IJ SOLN
10.0000 mL | INTRAMUSCULAR | Status: DC | PRN
  Administered 2015-03-19: 10 mL
  Filled 2015-03-19: qty 10

## 2015-03-19 MED ORDER — DOXORUBICIN HCL CHEMO IV INJECTION 2 MG/ML
60.0000 mg/m2 | Freq: Once | INTRAVENOUS | Status: AC
  Administered 2015-03-19: 106 mg via INTRAVENOUS
  Filled 2015-03-19: qty 53

## 2015-03-19 MED ORDER — PALONOSETRON HCL INJECTION 0.25 MG/5ML
INTRAVENOUS | Status: AC
  Filled 2015-03-19: qty 5

## 2015-03-19 MED ORDER — PEGFILGRASTIM 6 MG/0.6ML ~~LOC~~ PSKT
6.0000 mg | PREFILLED_SYRINGE | Freq: Once | SUBCUTANEOUS | Status: AC
  Administered 2015-03-19: 6 mg via SUBCUTANEOUS
  Filled 2015-03-19: qty 0.6

## 2015-03-19 MED ORDER — SODIUM CHLORIDE 0.9 % IV SOLN
Freq: Once | INTRAVENOUS | Status: AC
  Administered 2015-03-19: 13:00:00 via INTRAVENOUS

## 2015-03-19 MED ORDER — FOSAPREPITANT DIMEGLUMINE INJECTION 150 MG
Freq: Once | INTRAVENOUS | Status: AC
  Administered 2015-03-19: 14:00:00 via INTRAVENOUS
  Filled 2015-03-19: qty 5

## 2015-03-19 MED ORDER — PALONOSETRON HCL INJECTION 0.25 MG/5ML
0.2500 mg | Freq: Once | INTRAVENOUS | Status: AC
  Administered 2015-03-19: 0.25 mg via INTRAVENOUS

## 2015-03-19 MED ORDER — SODIUM CHLORIDE 0.9 % IV SOLN
600.0000 mg/m2 | Freq: Once | INTRAVENOUS | Status: AC
  Administered 2015-03-19: 1060 mg via INTRAVENOUS
  Filled 2015-03-19: qty 53

## 2015-03-19 MED ORDER — SUCRALFATE 1 GM/10ML PO SUSP: 1.0000 g | Freq: Three times a day (TID) | ORAL | Status: DC

## 2015-03-19 NOTE — Progress Notes (Signed)
[facility]  [phone] [phone]     ID: Bonnie Ward DOB: 1961/02/26  MR#: [id number]  VVZ#:[id number]  Patient Care Team: Lollie Sails, MD as PCP - General (Internal Medicine) Milford Cage, OTR Kem Boroughs, FNP as Nurse Practitioner (Nurse Practitioner) Fanny Skates, MD as Consulting Physician (General Surgery) Chauncey Cruel, MD as Consulting Physician (Oncology) Thea Silversmith, MD as Consulting Physician (Radiation Oncology) Rockwell Germany, RN as Registered Nurse Mauro Kaufmann, RN as Registered Nurse Holley Bouche, NP as Nurse Practitioner (Nurse Practitioner) Kristeen Miss, MD as Consulting Physician (Neurosurgery) PCP: Lollie Sails, MD OTHER MD:  CHIEF COMPLAINT: Estrogen receptor positive breast cancer  CURRENT TREATMENT: adjuvant chemotherapy   BREAST CANCER HISTORY: From the initial intake note:   Bonnie Ward had routine bilateral screening mammography at the [facility] for 20 01/09/2015 showing a possible mass in the right breast. On 12/22/2014 she underwent diagnostic right mammography with tomosynthesis and right breast ultrasonography. The breast density was category C. In the right breast there was an area of increased density with architectural distortion and faint microcalcifications in the upper outer quadrant. There was mild palpable soft tissue thickening in the area in question, but no palpable right axillary lymph nodes. Ultrasound confirmed a 1.7 cm irregular hypoechoic mass. There were no abnormal-appearing right axillary lymph nodes.  Biopsy of the right breast mass in question 12/26/2014 showed ([facility] 16-08/08/2005) an invasive ductal carcinoma, grade 1 or 2, with a prognostic panel still pending.  Her subsequent history is as detailed below  INTERVAL HISTORY: Bonnie Ward returns today for follow-up of her breast cancer. Today is day 1, cycle 2 of doxorubicin and cyclophosphamide, with neulasta  given on day 2 for granulocyte support.  REVIEW OF SYSTEMS: Bonnie Ward denies fevers or chills. She has mild nausea that resolves with saltines and compazine. She avoids zofran because of headaches. Her bowels and appetite are back to normal. The carafate is very soothing for her heartburn and throat irritation. She still has her hair, but it shedding some. A detailed review of systems is otherwise stable.  PAST MEDICAL HISTORY: Past Medical History  Diagnosis Date  . ACID REFLUX DISEASE 07/12/2010  . Arachnoid cyst     left frontal lobe  . Migraines   . Arthritis     hands  . Nervous stomach   . Difficulty swallowing pills   . Hypothyroidism   . Stress fracture of foot 12/2014    right  . Glaucoma   . Breast cancer 12/2014    right  . Anxiety   . Dental crowns present   . Seasonal allergies   . Complication of anesthesia 2012    states woke up during colonoscopy with Propofol    PAST SURGICAL HISTORY: Past Surgical History  Procedure Laterality Date  . Tonsillectomy  age 18  . Esophagogastroduodenoscopy endoscopy    . Tooth extraction    . Lasik    . Colonoscopy with propofol  10/14/2013  . Radioactive seed guided mastectomy with axillary sentinel lymph node biopsy Right 01/23/2015    Procedure: RIGHT PARTIAL MASTECTOMY WITH RADIOACTIVE SEED LOCALIZATION  WITH RIGHT AXILLARY SENTINEL LYMPH NODE BIOPSY;  Surgeon: Fanny Skates, MD;  Location: [facility];  Service: General;  Laterality: Right;  . Portacath placement Left 01/23/2015    Procedure: INSERTION PORT-A-CATH ;  Surgeon: Fanny Skates, MD;  Location: [facility];  Service: General;  Laterality: Left;  . Re-excision of breast lumpectomy Right 02/16/2015  Procedure: RE-EXCISION OF RIGHT  BREAST LUMPECTOMY MARGINS;  Surgeon: Fanny Skates, MD;  Location: [facility];  Service: General;  Laterality: Right;    FAMILY HISTORY Family History  Problem Relation Age of Onset  .  Osteoporosis Mother   . Irritable bowel syndrome Mother   . Hypertension Mother   . Hypertension Father   . Hyperlipidemia Father   . Diabetes Father   . Dementia Father   . Colon cancer Maternal Grandfather 80  . Breast cancer Paternal Aunt   . Breast cancer Paternal Grandmother    the patient's father died at age 50 from pneumonia. [name] died at age 54 after bowel perforation. The patient had one brother, one sister. There is no history of breast or ovarian cancer in the family.  GYNECOLOGIC HISTORY:  Patient's last menstrual period was 01/25/2011. Menarche age 39, the patient is GX P0. She stopped having periods approximately 2011. She is still on hormone replacement, but is "trickling off it". She did take birth control pills for approximately 3 years remotely with no complications  SOCIAL HISTORY:  Bonnie Ward works as a Haematologist. She is divorced and lives by herself, with 2 dogs.    ADVANCED DIRECTIVES: The patient's brother [name] is her healthcare power of attorney. He may be reached in [place] at [place]: History  Substance Use Topics  . Smoking status: Former Smoker -- 0.00 packs/day for 0 years    Quit date: 02/19/2005  . Smokeless tobacco: Never Used  . Alcohol Use: Yes     Comment: occasionally     Colonoscopy: February 2016  PAP: June 2015  Bone density: October 2015  Lipid panel:  Allergies  Allergen Reactions  . Lortab [Hydrocodone-Acetaminophen] Anxiety  . Milk-Related Compounds Diarrhea    GI UPSET    Current Outpatient Prescriptions  Medication Sig Dispense Refill  . ALPRAZolam (XANAX) 0.25 MG tablet Take 0.25 mg by mouth at bedtime as needed for anxiety. 1/2 tab. daily    . bimatoprost (LUMIGAN) 0.03 % ophthalmic solution Place 1 drop into both eyes at bedtime.    Marland Kitchen CARAFATE 1 GM/10ML suspension TAKE 10 MLS BY MOUTH FOUR TIMES DAILY WITH MEALS AND AT BEDTIME 420 mL 0  . dexamethasone (DECADRON) 4 MG  tablet Take 2 tablets by mouth once a day on the day after chemotherapy and then take 2 tablets two times a day for 2 days. Take with food. 30 tablet 1  . lidocaine-prilocaine (EMLA) cream Apply to affected area once 30 g 3  . liothyronine (CYTOMEL) 5 MCG tablet Take 5 mcg by mouth 2 (two) times daily.    Marland Kitchen LORazepam (ATIVAN) 0.5 MG tablet Take 1 tablet (0.5 mg total) by mouth at bedtime. 30 tablet 0  . prochlorperazine (COMPAZINE) 10 MG tablet Take 1 tablet (10 mg total) by mouth every 6 (six) hours as needed (Nausea or vomiting). 30 tablet 1  . SUMAtriptan (IMITREX) 50 MG tablet TAKE 1 TABLET BY MOUTH AT THE ONSET OF HEADACHE. TAKE NO MORE THAN 4 TABLETS BY MOUTH IN 24 HOURS 9 tablet 5  . acetaminophen (TYLENOL) 325 MG tablet Take 650 mg by mouth every 6 (six) hours as needed.    . ondansetron (ZOFRAN) 8 MG tablet Take 1 tablet (8 mg total) by mouth 2 (two) times daily as needed. Start on the third day after chemotherapy. (Patient not taking: Reported on 03/13/2015) 30 tablet 1  . sucralfate (CARAFATE) 1 GM/10ML suspension Take 10  mLs (1 g total) by mouth 4 (four) times daily -  with meals and at bedtime. 420 mL 0   No current facility-administered medications for this visit.    OBJECTIVE: Middle-aged white woman who appears well Filed Vitals:   03/19/15 1118  BP: 127/82  Pulse: 83  Temp: 98 F (36.7 C)  Resp: 18     Body mass index is 24.5 kg/(m^2).    ECOG FS:0 - Asymptomatic  Sclerae unicteric, pupils round and equal Oropharynx clear and moist-- no thrush or other lesions No cervical or supraclavicular adenopathy Lungs no rales or rhonchi Heart regular rate and rhythm Abd soft, nontender, positive bowel sounds MSK no focal spinal tenderness, no upper extremity lymphedema Neuro: nonfocal, well oriented, appropriate affect Breasts: deferred  LAB RESULTS:  CMP     Component Value Date/Time   NA 141 03/19/2015 1047   NA 140 01/21/2009 0828   K 4.1 03/19/2015 1047   K 4.1  01/21/2009 0828   CL 108 01/21/2009 0828   CO2 30* 03/19/2015 1047   CO2 30 01/21/2009 0828   GLUCOSE 100 03/19/2015 1047   GLUCOSE 90 01/21/2009 0828   BUN 9.8 03/19/2015 1047   BUN 13 01/21/2009 0828   CREATININE 0.7 03/19/2015 1047   CREATININE 0.7 01/21/2009 0828   CALCIUM 9.0 03/19/2015 1047   CALCIUM 8.5 01/21/2009 0828   PROT 6.8 03/19/2015 1047   PROT 6.9 01/21/2009 0828   ALBUMIN 3.7 03/19/2015 1047   ALBUMIN 3.7 01/21/2009 0828   AST 21 03/19/2015 1047   AST 22 01/21/2009 0828   ALT 19 03/19/2015 1047   ALT 19 01/21/2009 0828   ALKPHOS 103 03/19/2015 1047   ALKPHOS 43 01/21/2009 0828   BILITOT 0.20 03/19/2015 1047   BILITOT 1.1 01/21/2009 0828   GFRNONAA 95.01 01/21/2009 0828    INo results found for: SPEP, UPEP  Lab Results  Component Value Date   WBC 12.2* 03/19/2015   NEUTROABS 9.1* 03/19/2015   HGB 12.1 03/19/2015   HCT 36.1 03/19/2015   MCV 95.1 03/19/2015   PLT 284 03/19/2015      Chemistry      Component Value Date/Time   NA 141 03/19/2015 1047   NA 140 01/21/2009 0828   K 4.1 03/19/2015 1047   K 4.1 01/21/2009 0828   CL 108 01/21/2009 0828   CO2 30* 03/19/2015 1047   CO2 30 01/21/2009 0828   BUN 9.8 03/19/2015 1047   BUN 13 01/21/2009 0828   CREATININE 0.7 03/19/2015 1047   CREATININE 0.7 01/21/2009 0828      Component Value Date/Time   CALCIUM 9.0 03/19/2015 1047   CALCIUM 8.5 01/21/2009 0828   ALKPHOS 103 03/19/2015 1047   ALKPHOS 43 01/21/2009 0828   AST 21 03/19/2015 1047   AST 22 01/21/2009 0828   ALT 19 03/19/2015 1047   ALT 19 01/21/2009 0828   BILITOT 0.20 03/19/2015 1047   BILITOT 1.1 01/21/2009 0828       No results found for: LABCA2  No components found for: LABCA125  No results for input(s): INR in the last 168 hours.  Urinalysis    Component Value Date/Time   COLORURINE yellow 10/05/2009 1454   APPEARANCEUR Clear 10/05/2009 1454   LABSPEC 1.010 10/05/2009 1454   PHURINE 7.0 10/05/2009 1454   HGBUR 2+  10/05/2009 1454   BILIRUBINUR negative 04/07/2014 1605   BILIRUBINUR negative 10/05/2009 1454   PROTEINUR negative 04/07/2014 1605   UROBILINOGEN negative 04/07/2014 1605   UROBILINOGEN  0.2 10/05/2009 1454   NITRITE negative 04/07/2014 1605   NITRITE negative 10/05/2009 1454   LEUKOCYTESUR Negative 04/07/2014 1605    STUDIES: No results found.  ASSESSMENT: 54 y.o. [place] woman status post right breast biopsy 12/26/2014 for a clinical T1 cN0, stage IA invasive ductal carcinoma, grade 1 or 2, estrogen and progesterone receptor positive, with an MIB-1 of 11%, and HER-2 amplified, with a signals ratio of 2.07, number per cell 2.28.  (1) status post right lumpectomy and sentinel lymph node sampling 01/23/2015 for a pT1c pN1, stage IIA invasive ductal carcinoma, grade 1, with focally positive anterior margins  (a) additional surgery for margin clearance 02/16/2015 followed residual disease  (2) chemotherapy will  start 03/05/2015, to consist of doxorubicin and cyclophosphamide in dose dense fashion 4 followed by paclitaxel weekly,  with trastuzumab and pertuzumab given every 3 weeks   (3) trastuzumab will be continued to complete 1 year  (a) echocardiogram 02/03/2015 shows a normal ejection fraction  (4) adjuvant radiation to follow chemotherapy  (5) anti-estrogens to follow radiation  PLAN: Bonnie Ward is doing reasonably well today. She is anxious about having more constipation, so she will start her stool softeners at night. The carafate is working well for her, but she can have throat irritation when she skips a dose. Unfortunately the bottle she got from [facility] cost her near $40. I have given her a paper copy to shop around at our outpatient pharmacy or another retailed to find a more reasonable price.   The labs were reviewed in detail and were entirely stable. She will proceed with cycle 2 of doxorubicin and cyclophosphamide as planned today.  Bonnie Ward will return in 1 week for labs  and a nadir visit. She understands and agrees with this plan. She knows the goal of treatment in her case is cure. She has been encouraged to call with any issues that might arise before her next visit here.  Laurie Panda, NP   03/19/2015 11:44 AM

## 2015-03-19 NOTE — Patient Instructions (Signed)
[facility] Discharge Instructions for Patients Receiving Chemotherapy  Today you received the following chemotherapy agents: Adriamycin and Cytoxan.  To help prevent nausea and vomiting after your treatment, we encourage you to take your nausea medication: Compazine. Take one every 6 hours as needed. For nausea on or after 7/31 you may take Zofran every 8 hours as needed.    If you develop nausea and vomiting that is not controlled by your nausea medication, call the clinic.   BELOW ARE SYMPTOMS THAT SHOULD BE REPORTED IMMEDIATELY:  *FEVER GREATER THAN 100.5 F  *CHILLS WITH OR WITHOUT FEVER  NAUSEA AND VOMITING THAT IS NOT CONTROLLED WITH YOUR NAUSEA MEDICATION  *UNUSUAL SHORTNESS OF BREATH  *UNUSUAL BRUISING OR BLEEDING  TENDERNESS IN MOUTH AND THROAT WITH OR WITHOUT PRESENCE OF ULCERS  *URINARY PROBLEMS  *BOWEL PROBLEMS  UNUSUAL RASH Items with * indicate a potential emergency and should be followed up as soon as possible.  Feel free to call the clinic should you have any questions or concerns. The clinic phone number is [phone].  Please show the [name] at check-in to the Emergency Department and triage nurse.

## 2015-03-21 ENCOUNTER — Ambulatory Visit: Payer: BLUE CROSS/BLUE SHIELD

## 2015-03-27 ENCOUNTER — Encounter: Payer: Self-pay | Admitting: Nurse Practitioner

## 2015-03-27 ENCOUNTER — Other Ambulatory Visit (HOSPITAL_BASED_OUTPATIENT_CLINIC_OR_DEPARTMENT_OTHER): Payer: BLUE CROSS/BLUE SHIELD

## 2015-03-27 ENCOUNTER — Ambulatory Visit (HOSPITAL_BASED_OUTPATIENT_CLINIC_OR_DEPARTMENT_OTHER): Payer: BLUE CROSS/BLUE SHIELD | Admitting: Nurse Practitioner

## 2015-03-27 VITALS — BP 109/76 | HR 105 | Temp 98.3°F | Resp 18 | Ht 66.0 in | Wt 151.3 lb

## 2015-03-27 DIAGNOSIS — C50411 Malignant neoplasm of upper-outer quadrant of right female breast: Secondary | ICD-10-CM | POA: Diagnosis not present

## 2015-03-27 DIAGNOSIS — K210 Gastro-esophageal reflux disease with esophagitis: Secondary | ICD-10-CM | POA: Diagnosis not present

## 2015-03-27 DIAGNOSIS — Z170 Estrogen receptor positive status [ER+]: Secondary | ICD-10-CM | POA: Diagnosis not present

## 2015-03-27 DIAGNOSIS — K5900 Constipation, unspecified: Secondary | ICD-10-CM

## 2015-03-27 DIAGNOSIS — K1231 Oral mucositis (ulcerative) due to antineoplastic therapy: Secondary | ICD-10-CM | POA: Insufficient documentation

## 2015-03-27 DIAGNOSIS — R12 Heartburn: Secondary | ICD-10-CM

## 2015-03-27 LAB — COMPREHENSIVE METABOLIC PANEL (CC13)
ALK PHOS: 121 U/L (ref 40–150)
ALT: 18 U/L (ref 0–55)
AST: 13 U/L (ref 5–34)
Albumin: 3.6 g/dL (ref 3.5–5.0)
Anion Gap: 7 mEq/L (ref 3–11)
BILIRUBIN TOTAL: 0.22 mg/dL (ref 0.20–1.20)
BUN: 9.3 mg/dL (ref 7.0–26.0)
CO2: 29 meq/L (ref 22–29)
Calcium: 8.7 mg/dL (ref 8.4–10.4)
Chloride: 103 mEq/L (ref 98–109)
Creatinine: 0.7 mg/dL (ref 0.6–1.1)
EGFR: >90 mL/min/{1.73_m2} (ref >90)
Glucose: 103 mg/dl (ref 70–140)
POTASSIUM: 4.1 meq/L (ref 3.5–5.1)
Sodium: 139 mEq/L (ref 136–145)
Total Protein: 6.5 g/dL (ref 6.4–8.3)

## 2015-03-27 LAB — CBC WITH DIFFERENTIAL/PLATELET
BASO%: 1.7 % (ref 0.0–2.0)
BASOS ABS: 0.1 10*3/uL (ref 0.0–0.1)
EOS%: 1.4 % (ref 0.0–7.0)
Eosinophils Absolute: 0 10*3/uL (ref 0.0–0.5)
HCT: 32.6 % — ABNORMAL LOW (ref 34.8–46.6)
HGB: 11.1 g/dL — ABNORMAL LOW (ref 11.6–15.9)
LYMPH%: 41.5 % (ref 14.0–49.7)
MCH: 32.2 pg (ref 25.1–34.0)
MCHC: 34 g/dL (ref 31.5–36.0)
MCV: 94.5 fL (ref 79.5–101.0)
MONO#: 0.7 10*3/uL (ref 0.1–0.9)
MONO%: 23.7 % — AB (ref 0.0–14.0)
NEUT#: 0.9 10*3/uL — ABNORMAL LOW (ref 1.5–6.5)
NEUT%: 31.7 % — AB (ref 38.4–76.8)
Platelets: 185 10*3/uL (ref 145–400)
RBC: 3.45 10*6/uL — ABNORMAL LOW (ref 3.70–5.45)
RDW: 13.6 % (ref 11.2–14.5)
WBC: 2.9 10*3/uL — ABNORMAL LOW (ref 3.9–10.3)
lymph#: 1.2 10*3/uL (ref 0.9–3.3)

## 2015-03-27 MED ORDER — VALACYCLOVIR HCL 500 MG PO TABS: 500.0000 mg | ORAL_TABLET | Freq: Every day | ORAL | Status: DC

## 2015-03-27 MED ORDER — OMEPRAZOLE 40 MG PO CPDR: 40.0000 mg | DELAYED_RELEASE_CAPSULE | Freq: Every day | ORAL | Status: DC

## 2015-03-27 NOTE — Progress Notes (Signed)
[facility]  Telephone:(336) [phone] Fax:(336) [phone]     ID: Lillie Columbia DOB: [birth year]  MR#: [id number]  BOF#:[id number]  Patient Care Team: Lollie Sails, MD as PCP - General (Internal Medicine) Milford Cage, OTR Kem Boroughs, FNP as Nurse Practitioner (Nurse Practitioner) Fanny Skates, MD as Consulting Physician (General Surgery) Chauncey Cruel, MD as Consulting Physician (Oncology) Thea Silversmith, MD as Consulting Physician (Radiation Oncology) Rockwell Germany, RN as Registered Nurse Mauro Kaufmann, RN as Registered Nurse Holley Bouche, NP as Nurse Practitioner (Nurse Practitioner) Kristeen Miss, MD as Consulting Physician (Neurosurgery) PCP: Lollie Sails, MD OTHER MD:  CHIEF COMPLAINT: Estrogen receptor positive breast cancer  CURRENT TREATMENT: adjuvant chemotherapy   BREAST CANCER HISTORY: From the initial intake note:   Sameen had routine bilateral screening mammography at the [facility] for 20 01/09/2015 showing a possible mass in the right breast. On 12/22/2014 she underwent diagnostic right mammography with tomosynthesis and right breast ultrasonography. The breast density was category C. In the right breast there was an area of increased density with architectural distortion and faint microcalcifications in the upper outer quadrant. There was mild palpable soft tissue thickening in the area in question, but no palpable right axillary lymph nodes. Ultrasound confirmed a 1.7 cm irregular hypoechoic mass. There were no abnormal-appearing right axillary lymph nodes.  Biopsy of the right breast mass in question 12/26/2014 showed (SAA 16-08/08/2005) an invasive ductal carcinoma, grade 1 or 2, with a prognostic panel still pending.  Her subsequent history is as detailed below  INTERVAL HISTORY: Bindi returns today for follow-up of her breast cancer. Today is day 1, cycle 2 of doxorubicin and cyclophosphamide, with neulasta  given on day 2 for granulocyte support.  REVIEW OF SYSTEMS: Rital denies fevers or chills. She has mild nausea that resolves with saltines and compazine. She avoids zofran because of headaches. Her bowels and appetite are back to normal. The carafate is very soothing for her heartburn and throat irritation. She still has her hair, but it shedding some. A detailed review of systems is otherwise stable.  PAST MEDICAL HISTORY: Past Medical History  Diagnosis Date  . ACID REFLUX DISEASE 07/12/2010  . Arachnoid cyst     left frontal lobe  . Migraines   . Arthritis     hands  . Nervous stomach   . Difficulty swallowing pills   . Hypothyroidism   . Stress fracture of foot 12/2014    right  . Glaucoma   . Breast cancer 12/2014    right  . Anxiety   . Dental crowns present   . Seasonal allergies   . Complication of anesthesia 2012    states woke up during colonoscopy with Propofol    PAST SURGICAL HISTORY: Past Surgical History  Procedure Laterality Date  . Tonsillectomy  age 77  . Esophagogastroduodenoscopy endoscopy    . Tooth extraction    . Lasik    . Colonoscopy with propofol  10/14/2013  . Radioactive seed guided mastectomy with axillary sentinel lymph node biopsy Right 01/23/2015    Procedure: RIGHT PARTIAL MASTECTOMY WITH RADIOACTIVE SEED LOCALIZATION  WITH RIGHT AXILLARY SENTINEL LYMPH NODE BIOPSY;  Surgeon: Fanny Skates, MD;  Location: [facility];  Service: General;  Laterality: Right;  . Portacath placement Left 01/23/2015    Procedure: INSERTION PORT-A-CATH ;  Surgeon: Fanny Skates, MD;  Location: [facility];  Service: General;  Laterality: Left;  . Re-excision of breast lumpectomy Right 02/16/2015  Procedure: RE-EXCISION OF RIGHT  BREAST LUMPECTOMY MARGINS;  Surgeon: Fanny Skates, MD;  Location: [facility];  Service: General;  Laterality: Right;    FAMILY HISTORY Family History  Problem Relation Age of Onset  .  Osteoporosis Mother   . Irritable bowel syndrome Mother   . Hypertension Mother   . Hypertension Father   . Hyperlipidemia Father   . Diabetes Father   . Dementia Father   . Colon cancer Maternal Grandfather 54  . Breast cancer Paternal Aunt   . Breast cancer Paternal Grandmother    the patient's father died at age 54 from pneumonia. [name] died at age 54 after bowel perforation. The patient had one brother, one sister. There is no history of breast or ovarian cancer in the family.  GYNECOLOGIC HISTORY:  Patient's last menstrual period was 01/25/2011. Menarche age 28, the patient is GX P0. She stopped having periods approximately 2011. She is still on hormone replacement, but is "trickling off it". She did take birth control pills for approximately 3 years remotely with no complications  SOCIAL HISTORY:  Forever works as a Haematologist. She is divorced and lives by herself, with 2 dogs.    ADVANCED DIRECTIVES: The patient's brother [name] is her healthcare power of attorney. He may be reached in [place] at [place]: History  Substance Use Topics  . Smoking status: Former Smoker -- 0.00 packs/day for 0 years    Quit date: 02/19/2005  . Smokeless tobacco: Never Used  . Alcohol Use: Yes     Comment: occasionally     Colonoscopy: February 2016  PAP: June 2015  Bone density: October 2015  Lipid panel:  Allergies  Allergen Reactions  . Lortab [Hydrocodone-Acetaminophen] Anxiety  . Milk-Related Compounds Diarrhea    GI UPSET    Current Outpatient Prescriptions  Medication Sig Dispense Refill  . acetaminophen (TYLENOL) 325 MG tablet Take 650 mg by mouth every 6 (six) hours as needed.    . ALPRAZolam (XANAX) 0.25 MG tablet Take 0.25 mg by mouth at bedtime as needed for anxiety. 1/2 tab. daily    . bimatoprost (LUMIGAN) 0.03 % ophthalmic solution Place 1 drop into both eyes at bedtime.    Marland Kitchen dexamethasone (DECADRON) 4 MG tablet Take 2  tablets by mouth once a day on the day after chemotherapy and then take 2 tablets two times a day for 2 days. Take with food. 30 tablet 1  . lidocaine-prilocaine (EMLA) cream Apply to affected area once 30 g 3  . liothyronine (CYTOMEL) 5 MCG tablet Take 5 mcg by mouth 2 (two) times daily.    Marland Kitchen LORazepam (ATIVAN) 0.5 MG tablet Take 1 tablet (0.5 mg total) by mouth at bedtime. 30 tablet 0  . prochlorperazine (COMPAZINE) 10 MG tablet Take 1 tablet (10 mg total) by mouth every 6 (six) hours as needed (Nausea or vomiting). 30 tablet 1  . sucralfate (CARAFATE) 1 GM/10ML suspension Take 10 mLs (1 g total) by mouth 4 (four) times daily -  with meals and at bedtime. 420 mL 0  . omeprazole (PRILOSEC) 40 MG capsule Take 1 capsule (40 mg total) by mouth daily. 30 capsule 3  . ondansetron (ZOFRAN) 8 MG tablet Take 1 tablet (8 mg total) by mouth 2 (two) times daily as needed. Start on the third day after chemotherapy. (Patient not taking: Reported on 03/13/2015) 30 tablet 1  . SUMAtriptan (IMITREX) 50 MG tablet TAKE 1 TABLET BY MOUTH AT THE  ONSET OF HEADACHE. TAKE NO MORE THAN 4 TABLETS BY MOUTH IN 24 HOURS (Patient not taking: Reported on 03/27/2015) 9 tablet 5  . valACYclovir (VALTREX) 500 MG tablet Take 1 tablet (500 mg total) by mouth daily. 30 tablet 3   No current facility-administered medications for this visit.    OBJECTIVE: Middle-aged white woman who appears well Filed Vitals:   03/27/15 1339  BP: 109/76  Pulse: 105  Temp: 98.3 F (36.8 C)  Resp: 18     Body mass index is 24.43 kg/(m^2).    ECOG FS:0 - Asymptomatic  Sclerae unicteric, pupils round and equal Oropharynx clear and moist-- no thrush or other lesions No cervical or supraclavicular adenopathy Lungs no rales or rhonchi Heart regular rate and rhythm Abd soft, nontender, positive bowel sounds MSK no focal spinal tenderness, no upper extremity lymphedema Neuro: nonfocal, well oriented, appropriate affect Breasts: deferred  LAB  RESULTS:  CMP     Component Value Date/Time   NA 139 03/27/2015 1330   NA 140 01/21/2009 0828   K 4.1 03/27/2015 1330   K 4.1 01/21/2009 0828   CL 108 01/21/2009 0828   CO2 29 03/27/2015 1330   CO2 30 01/21/2009 0828   GLUCOSE 103 03/27/2015 1330   GLUCOSE 90 01/21/2009 0828   BUN 9.3 03/27/2015 1330   BUN 13 01/21/2009 0828   CREATININE 0.7 03/27/2015 1330   CREATININE 0.7 01/21/2009 0828   CALCIUM 8.7 03/27/2015 1330   CALCIUM 8.5 01/21/2009 0828   PROT 6.5 03/27/2015 1330   PROT 6.9 01/21/2009 0828   ALBUMIN 3.6 03/27/2015 1330   ALBUMIN 3.7 01/21/2009 0828   AST 13 03/27/2015 1330   AST 22 01/21/2009 0828   ALT 18 03/27/2015 1330   ALT 19 01/21/2009 0828   ALKPHOS 121 03/27/2015 1330   ALKPHOS 43 01/21/2009 0828   BILITOT 0.22 03/27/2015 1330   BILITOT 1.1 01/21/2009 0828   GFRNONAA 95.01 01/21/2009 0828    INo results found for: SPEP, UPEP  Lab Results  Component Value Date   WBC 2.9* 03/27/2015   NEUTROABS 0.9* 03/27/2015   HGB 11.1* 03/27/2015   HCT 32.6* 03/27/2015   MCV 94.5 03/27/2015   PLT 185 03/27/2015      Chemistry      Component Value Date/Time   NA 139 03/27/2015 1330   NA 140 01/21/2009 0828   K 4.1 03/27/2015 1330   K 4.1 01/21/2009 0828   CL 108 01/21/2009 0828   CO2 29 03/27/2015 1330   CO2 30 01/21/2009 0828   BUN 9.3 03/27/2015 1330   BUN 13 01/21/2009 0828   CREATININE 0.7 03/27/2015 1330   CREATININE 0.7 01/21/2009 0828      Component Value Date/Time   CALCIUM 8.7 03/27/2015 1330   CALCIUM 8.5 01/21/2009 0828   ALKPHOS 121 03/27/2015 1330   ALKPHOS 43 01/21/2009 0828   AST 13 03/27/2015 1330   AST 22 01/21/2009 0828   ALT 18 03/27/2015 1330   ALT 19 01/21/2009 0828   BILITOT 0.22 03/27/2015 1330   BILITOT 1.1 01/21/2009 0828       No results found for: LABCA2  No components found for: LABCA125  No results for input(s): INR in the last 168 hours.  Urinalysis    Component Value Date/Time   COLORURINE yellow  10/05/2009 1454   APPEARANCEUR Clear 10/05/2009 1454   LABSPEC 1.010 10/05/2009 1454   PHURINE 7.0 10/05/2009 1454   HGBUR 2+ 10/05/2009 1454   BILIRUBINUR negative 04/07/2014  [place] negative 10/05/2009 1454   PROTEINUR negative 04/07/2014 1605   UROBILINOGEN negative 04/07/2014 1605   UROBILINOGEN 0.2 10/05/2009 1454   NITRITE negative 04/07/2014 1605   NITRITE negative 10/05/2009 1454   LEUKOCYTESUR Negative 04/07/2014 1605    STUDIES: No results found.  ASSESSMENT: 54 y.o. [place] woman status post right breast biopsy 12/26/2014 for a clinical T1 cN0, stage IA invasive ductal carcinoma, grade 1 or 2, estrogen and progesterone receptor positive, with an MIB-1 of 11%, and HER-2 amplified, with a signals ratio of 2.07, number per cell 2.28.  (1) status post right lumpectomy and sentinel lymph node sampling 01/23/2015 for a pT1c pN1, stage IIA invasive ductal carcinoma, grade 1, with focally positive anterior margins  (a) additional surgery for margin clearance 02/16/2015 followed residual disease  (2) chemotherapy will  start 03/05/2015, to consist of doxorubicin and cyclophosphamide in dose dense fashion 4 followed by paclitaxel weekly,  with trastuzumab and pertuzumab given every 3 weeks   (3) trastuzumab will be continued to complete 1 year  (a) echocardiogram 02/03/2015 shows a normal ejection fraction  (4) adjuvant radiation to follow chemotherapy  (5) anti-estrogens to follow radiation  PLAN: Rennee's reflux, esophagitis symptoms continue to be her main concern. As she has a history of a hiatal hernia, I told her it was best to follow up with her GI specialist Dr. Ardis Hughs as soon as possible. In the meantime she will continue the carafate, and she has given in to my request to add 39m omeprazole daily. Her concern with this drug was constipation, which she is already experiencing. She will increase her colace to 2077mBID, add senna BID, as well as miralax daily.  She will increase her water and fiber intake as well, artificially if she has to with metamucil or citrucil potentiall.  For her mouth sores I have called in venlafaxine 50058maily.   Joyice will return in 1 week for labs and cycle 3 of treatment. She understands and agrees with this plan. She knows the goal of treatment in her case is cure. She has been encouraged to call with any issues that might arise before her next visit here.  HeaLaurie PandaP   03/27/2015 3:16 PM

## 2015-04-02 ENCOUNTER — Ambulatory Visit (HOSPITAL_BASED_OUTPATIENT_CLINIC_OR_DEPARTMENT_OTHER): Payer: BLUE CROSS/BLUE SHIELD

## 2015-04-02 ENCOUNTER — Encounter: Payer: Self-pay | Admitting: Nurse Practitioner

## 2015-04-02 ENCOUNTER — Telehealth: Payer: Self-pay | Admitting: Nurse Practitioner

## 2015-04-02 ENCOUNTER — Other Ambulatory Visit (HOSPITAL_BASED_OUTPATIENT_CLINIC_OR_DEPARTMENT_OTHER): Payer: BLUE CROSS/BLUE SHIELD

## 2015-04-02 ENCOUNTER — Ambulatory Visit (HOSPITAL_BASED_OUTPATIENT_CLINIC_OR_DEPARTMENT_OTHER): Payer: BLUE CROSS/BLUE SHIELD | Admitting: Nurse Practitioner

## 2015-04-02 ENCOUNTER — Encounter: Payer: Self-pay | Admitting: *Deleted

## 2015-04-02 VITALS — BP 117/79 | HR 87 | Temp 98.7°F | Resp 18 | Ht 66.0 in | Wt 153.7 lb

## 2015-04-02 DIAGNOSIS — Z170 Estrogen receptor positive status [ER+]: Secondary | ICD-10-CM

## 2015-04-02 DIAGNOSIS — Z5111 Encounter for antineoplastic chemotherapy: Secondary | ICD-10-CM

## 2015-04-02 DIAGNOSIS — C50411 Malignant neoplasm of upper-outer quadrant of right female breast: Secondary | ICD-10-CM

## 2015-04-02 DIAGNOSIS — R519 Headache, unspecified: Secondary | ICD-10-CM

## 2015-04-02 DIAGNOSIS — R12 Heartburn: Secondary | ICD-10-CM

## 2015-04-02 DIAGNOSIS — R51 Headache: Secondary | ICD-10-CM

## 2015-04-02 DIAGNOSIS — Z5189 Encounter for other specified aftercare: Secondary | ICD-10-CM | POA: Diagnosis not present

## 2015-04-02 LAB — CBC WITH DIFFERENTIAL/PLATELET
BASO%: 0.5 % (ref 0.0–2.0)
Basophils Absolute: 0.1 10*3/uL (ref 0.0–0.1)
EOS%: 0.1 % (ref 0.0–7.0)
Eosinophils Absolute: 0 10*3/uL (ref 0.0–0.5)
HCT: 31.9 % — ABNORMAL LOW (ref 34.8–46.6)
HGB: 10.7 g/dL — ABNORMAL LOW (ref 11.6–15.9)
LYMPH%: 10.9 % — ABNORMAL LOW (ref 14.0–49.7)
MCH: 31.8 pg (ref 25.1–34.0)
MCHC: 33.4 g/dL (ref 31.5–36.0)
MCV: 95.1 fL (ref 79.5–101.0)
MONO#: 1.1 10*3/uL — AB (ref 0.1–0.9)
MONO%: 8.6 % (ref 0.0–14.0)
NEUT#: 10.5 10*3/uL — ABNORMAL HIGH (ref 1.5–6.5)
NEUT%: 79.9 % — AB (ref 38.4–76.8)
PLATELETS: 250 10*3/uL (ref 145–400)
RBC: 3.36 10*6/uL — ABNORMAL LOW (ref 3.70–5.45)
RDW: 14 % (ref 11.2–14.5)
WBC: 13.1 10*3/uL — ABNORMAL HIGH (ref 3.9–10.3)
lymph#: 1.4 10*3/uL (ref 0.9–3.3)

## 2015-04-02 LAB — COMPREHENSIVE METABOLIC PANEL (CC13)
ALK PHOS: 115 U/L (ref 40–150)
ALT: 17 U/L (ref 0–55)
AST: 15 U/L (ref 5–34)
Albumin: 3.5 g/dL (ref 3.5–5.0)
Anion Gap: 6 mEq/L (ref 3–11)
BILIRUBIN TOTAL: 0.2 mg/dL (ref 0.20–1.20)
BUN: 9.9 mg/dL (ref 7.0–26.0)
CO2: 30 meq/L — AB (ref 22–29)
Calcium: 8.9 mg/dL (ref 8.4–10.4)
Chloride: 105 mEq/L (ref 98–109)
Creatinine: 0.7 mg/dL (ref 0.6–1.1)
GLUCOSE: 97 mg/dL (ref 70–140)
POTASSIUM: 4.5 meq/L (ref 3.5–5.1)
Sodium: 141 mEq/L (ref 136–145)
TOTAL PROTEIN: 6.4 g/dL (ref 6.4–8.3)

## 2015-04-02 MED ORDER — SODIUM CHLORIDE 0.9 % IV SOLN
Freq: Once | INTRAVENOUS | Status: AC
  Administered 2015-04-02: 11:00:00 via INTRAVENOUS

## 2015-04-02 MED ORDER — SODIUM CHLORIDE 0.9 % IJ SOLN
10.0000 mL | INTRAMUSCULAR | Status: DC | PRN
  Administered 2015-04-02: 10 mL
  Filled 2015-04-02: qty 10

## 2015-04-02 MED ORDER — CYCLOPHOSPHAMIDE CHEMO INJECTION 1 GM
600.0000 mg/m2 | Freq: Once | INTRAMUSCULAR | Status: AC
  Administered 2015-04-02: 1060 mg via INTRAVENOUS
  Filled 2015-04-02: qty 53

## 2015-04-02 MED ORDER — HEPARIN SOD (PORK) LOCK FLUSH 100 UNIT/ML IV SOLN
500.0000 [IU] | Freq: Once | INTRAVENOUS | Status: AC | PRN
  Administered 2015-04-02: 500 [IU]
  Filled 2015-04-02: qty 5

## 2015-04-02 MED ORDER — PALONOSETRON HCL INJECTION 0.25 MG/5ML
INTRAVENOUS | Status: AC
  Filled 2015-04-02: qty 5

## 2015-04-02 MED ORDER — DOXORUBICIN HCL CHEMO IV INJECTION 2 MG/ML
60.0000 mg/m2 | Freq: Once | INTRAVENOUS | Status: AC
  Administered 2015-04-02: 106 mg via INTRAVENOUS
  Filled 2015-04-02: qty 53

## 2015-04-02 MED ORDER — AZITHROMYCIN 250 MG PO TABS: ORAL_TABLET | ORAL | Status: DC

## 2015-04-02 MED ORDER — PEGFILGRASTIM 6 MG/0.6ML ~~LOC~~ PSKT
6.0000 mg | PREFILLED_SYRINGE | Freq: Once | SUBCUTANEOUS | Status: AC
  Administered 2015-04-02: 6 mg via SUBCUTANEOUS
  Filled 2015-04-02: qty 0.6

## 2015-04-02 MED ORDER — PALONOSETRON HCL INJECTION 0.25 MG/5ML
0.2500 mg | Freq: Once | INTRAVENOUS | Status: AC
Start: 2015-04-02 — End: 2015-04-02
  Administered 2015-04-02: 0.25 mg via INTRAVENOUS

## 2015-04-02 MED ORDER — SODIUM CHLORIDE 0.9 % IV SOLN
Freq: Once | INTRAVENOUS | Status: AC
  Administered 2015-04-02: 11:00:00 via INTRAVENOUS
  Filled 2015-04-02: qty 5

## 2015-04-02 NOTE — Progress Notes (Signed)
[facility]  Telephone:(336) [phone] Fax:(336) [phone]     ID: Lillie Columbia DOB: 1960/11/19  MR#: [id number]  UKG#:[id number]  Patient Care Team: Lollie Sails, MD as PCP - General (Internal Medicine) Milford Cage, OTR Kem Boroughs, FNP as Nurse Practitioner (Nurse Practitioner) Fanny Skates, MD as Consulting Physician (General Surgery) Chauncey Cruel, MD as Consulting Physician (Oncology) Thea Silversmith, MD as Consulting Physician (Radiation Oncology) Rockwell Germany, RN as Registered Nurse Mauro Kaufmann, RN as Registered Nurse Holley Bouche, NP as Nurse Practitioner (Nurse Practitioner) Kristeen Miss, MD as Consulting Physician (Neurosurgery) PCP: Lollie Sails, MD OTHER MD:  CHIEF COMPLAINT: Estrogen receptor positive breast cancer  CURRENT TREATMENT: adjuvant chemotherapy   BREAST CANCER HISTORY: From the initial intake note:   Jaquitta had routine bilateral screening mammography at the [facility] for 20 01/09/2015 showing a possible mass in the right breast. On 12/22/2014 she underwent diagnostic right mammography with tomosynthesis and right breast ultrasonography. The breast density was category C. In the right breast there was an area of increased density with architectural distortion and faint microcalcifications in the upper outer quadrant. There was mild palpable soft tissue thickening in the area in question, but no palpable right axillary lymph nodes. Ultrasound confirmed a 1.7 cm irregular hypoechoic mass. There were no abnormal-appearing right axillary lymph nodes.  Biopsy of the right breast mass in question 12/26/2014 showed (SAA 16-08/08/2005) an invasive ductal carcinoma, grade 1 or 2, with a prognostic panel still pending.  Her subsequent history is as detailed below  INTERVAL HISTORY: Norelle returns today for follow-up of her breast cancer. Today is day 1, cycle 3 of doxorubicin and cyclophosphamide, with neulasta  given on day 2 for granulocyte support.  REVIEW OF SYSTEMS: Since Zauria has finally started on the omeprazole, she has experienced much less heartburn and throat irritation. She is having to use less of the carafate. She is on stool softeners and miralax and has been able to move her bowels better. She denies fevers, chills, nausea, or vomiting. She has had sinus headaches every morning since Sunday and her mucus is turning green. She has gained some weight, but does not have the energy to work out. A detailed review of systems is otherwise stable.  PAST MEDICAL HISTORY: Past Medical History  Diagnosis Date  . ACID REFLUX DISEASE 07/12/2010  . Arachnoid cyst     left frontal lobe  . Migraines   . Arthritis     hands  . Nervous stomach   . Difficulty swallowing pills   . Hypothyroidism   . Stress fracture of foot 12/2014    right  . Glaucoma   . Breast cancer 12/2014    right  . Anxiety   . Dental crowns present   . Seasonal allergies   . Complication of anesthesia 2012    states woke up during colonoscopy with Propofol    PAST SURGICAL HISTORY: Past Surgical History  Procedure Laterality Date  . Tonsillectomy  age 63  . Esophagogastroduodenoscopy endoscopy    . Tooth extraction    . Lasik    . Colonoscopy with propofol  10/14/2013  . Radioactive seed guided mastectomy with axillary sentinel lymph node biopsy Right 01/23/2015    Procedure: RIGHT PARTIAL MASTECTOMY WITH RADIOACTIVE SEED LOCALIZATION  WITH RIGHT AXILLARY SENTINEL LYMPH NODE BIOPSY;  Surgeon: Fanny Skates, MD;  Location: [facility];  Service: General;  Laterality: Right;  . Portacath placement Left 01/23/2015    Procedure: INSERTION PORT-A-CATH ;  Surgeon: Fanny Skates, MD;  Location: [facility];  Service: General;  Laterality: Left;  . Re-excision of breast lumpectomy Right 02/16/2015    Procedure: RE-EXCISION OF RIGHT  BREAST LUMPECTOMY MARGINS;  Surgeon: Fanny Skates, MD;  Location:  [facility];  Service: General;  Laterality: Right;    FAMILY HISTORY Family History  Problem Relation Age of Onset  . Osteoporosis Mother   . Irritable bowel syndrome Mother   . Hypertension Mother   . Hypertension Father   . Hyperlipidemia Father   . Diabetes Father   . Dementia Father   . Colon cancer Maternal Grandfather 3  . Breast cancer Paternal Aunt   . Breast cancer Paternal Grandmother    the patient's father died at age 22 from pneumonia. [name] died at age 12 after bowel perforation. The patient had one brother, one sister. There is no history of breast or ovarian cancer in the family.  GYNECOLOGIC HISTORY:  Patient's last menstrual period was 01/25/2011. Menarche age 76, the patient is GX P0. She stopped having periods approximately 2011. She is still on hormone replacement, but is "trickling off it". 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    ADVANCED DIRECTIVES: The patient's brother [name] is her healthcare power of attorney. He may be reached in [place] at [place]: Social History  Substance Use Topics  . Smoking status: Former Smoker -- 0.00 packs/day for 0 years    Quit date: 02/19/2005  . Smokeless tobacco: Never Used  . Alcohol Use: Yes     Comment: occasionally     Colonoscopy: February 2016  PAP: June 2015  Bone density: October 2015  Lipid panel:  Allergies  Allergen Reactions  . Lortab [Hydrocodone-Acetaminophen] Anxiety  . Milk-Related Compounds Diarrhea    GI UPSET    Current Outpatient Prescriptions  Medication Sig Dispense Refill  . acetaminophen (TYLENOL) 325 MG tablet Take 650 mg by mouth every 6 (six) hours as needed.    . ALPRAZolam (XANAX) 0.25 MG tablet Take 0.25 mg by mouth at bedtime as needed for anxiety. 1/2 tab. daily     . bimatoprost (LUMIGAN) 0.03 % ophthalmic solution Place 1 drop into both eyes at bedtime.    Marland Kitchen dexamethasone (DECADRON) 4 MG tablet Take 2 tablets by mouth once a day on the day after chemotherapy and then take 2 tablets two times a day for 2 days. Take with food. 30 tablet 1  . lidocaine-prilocaine (EMLA) cream Apply to affected area once 30 g 3  . liothyronine (CYTOMEL) 5 MCG tablet Take 5 mcg by mouth 2 (two) times daily.    Marland Kitchen LORazepam (ATIVAN) 0.5 MG tablet Take 1 tablet (0.5 mg total) by mouth at bedtime. 30 tablet 0  . omeprazole (PRILOSEC) 40 MG capsule Take 1 capsule (40 mg total) by mouth daily. 30 capsule 3  . prochlorperazine (COMPAZINE) 10 MG tablet Take 1 tablet (10 mg total) by mouth every 6 (six) hours as needed (Nausea or vomiting). 30 tablet 1  . sucralfate (CARAFATE) 1 GM/10ML suspension Take 10 mLs (1 g total) by mouth 4 (four) times daily -  with meals and at bedtime. 420 mL 0  . valACYclovir (VALTREX) 500 MG tablet Take 1 tablet (500 mg total) by mouth daily. 30 tablet 3  . azithromycin (ZITHROMAX)  250 MG tablet Follow as package instructs. 6 each 0  . ondansetron (ZOFRAN) 8 MG tablet Take 1 tablet (8 mg total) by mouth 2 (two) times daily as needed. Start on the third day after chemotherapy. (Patient not taking: Reported on 03/13/2015) 30 tablet 1  . SUMAtriptan (IMITREX) 50 MG tablet TAKE 1 TABLET BY MOUTH AT THE ONSET OF HEADACHE. TAKE NO MORE THAN 4 TABLETS BY MOUTH IN 24 HOURS (Patient not taking: Reported on 03/27/2015) 9 tablet 5   No current facility-administered medications for this visit.    OBJECTIVE: Middle-aged white woman who appears well Filed Vitals:   04/02/15 1013  BP: 117/79  Pulse: 87  Temp: 98.7 F (37.1 C)  Resp: 18     Body mass index is 24.82 kg/(m^2).    ECOG FS:0 - Asymptomatic  Skin: warm, dry  HEENT: sclerae anicteric, conjunctivae pink, oropharynx clear. No thrush or mucositis.  Lymph Nodes: No cervical or supraclavicular lymphadenopathy   Lungs: clear to auscultation bilaterally, no rales, wheezes, or rhonci  Heart: regular rate and rhythm  Abdomen: round, soft, non tender, positive bowel sounds  Musculoskeletal: No focal spinal tenderness, no peripheral edema  Neuro: non focal, well oriented, positive affect  Breasts: deferred  LAB RESULTS:  CMP     Component Value Date/Time   NA 141 04/02/2015 0942   NA 140 01/21/2009 0828   K 4.5 04/02/2015 0942   K 4.1 01/21/2009 0828   CL 108 01/21/2009 0828   CO2 30* 04/02/2015 0942   CO2 30 01/21/2009 0828   GLUCOSE 97 04/02/2015 0942   GLUCOSE 90 01/21/2009 0828   BUN 9.9 04/02/2015 0942   BUN 13 01/21/2009 0828   CREATININE 0.7 04/02/2015 0942   CREATININE 0.7 01/21/2009 0828   CALCIUM 8.9 04/02/2015 0942   CALCIUM 8.5 01/21/2009 0828   PROT 6.4 04/02/2015 0942   PROT 6.9 01/21/2009 0828   ALBUMIN 3.5 04/02/2015 0942   ALBUMIN 3.7 01/21/2009 0828   AST 15 04/02/2015 0942   AST 22 01/21/2009 0828   ALT 17 04/02/2015 0942   ALT 19 01/21/2009 0828   ALKPHOS 115 04/02/2015 0942   ALKPHOS 43 01/21/2009 0828   BILITOT 0.20 04/02/2015 0942   BILITOT 1.1 01/21/2009 0828   GFRNONAA 95.01 01/21/2009 0828    INo results found for: SPEP, UPEP  Lab Results  Component Value Date   WBC 13.1* 04/02/2015   NEUTROABS 10.5* 04/02/2015   HGB 10.7* 04/02/2015   HCT 31.9* 04/02/2015   MCV 95.1 04/02/2015   PLT 250 04/02/2015      Chemistry      Component Value Date/Time   NA 141 04/02/2015 0942   NA 140 01/21/2009 0828   K 4.5 04/02/2015 0942   K 4.1 01/21/2009 0828   CL 108 01/21/2009 0828   CO2 30* 04/02/2015 0942   CO2 30 01/21/2009 0828   BUN 9.9 04/02/2015 0942   BUN 13 01/21/2009 0828   CREATININE 0.7 04/02/2015 0942   CREATININE 0.7 01/21/2009 0828      Component Value Date/Time   CALCIUM 8.9 04/02/2015 0942   CALCIUM 8.5 01/21/2009 0828   ALKPHOS 115 04/02/2015 0942   ALKPHOS 43 01/21/2009 0828   AST 15 04/02/2015 0942   AST 22 01/21/2009 0828    ALT 17 04/02/2015 0942   ALT 19 01/21/2009 0828   BILITOT 0.20 04/02/2015 0942   BILITOT 1.1 01/21/2009 0828       No results found for: LABCA2  No  components found for: [id number]  No results for input(s): INR in the last 168 hours.  Urinalysis    Component Value Date/Time   COLORURINE yellow 10/05/2009 1454   APPEARANCEUR Clear 10/05/2009 1454   LABSPEC 1.010 10/05/2009 1454   PHURINE 7.0 10/05/2009 1454   HGBUR 2+ 10/05/2009 1454   BILIRUBINUR negative 04/07/2014 1605   BILIRUBINUR negative 10/05/2009 1454   PROTEINUR negative 04/07/2014 1605   UROBILINOGEN negative 04/07/2014 1605   UROBILINOGEN 0.2 10/05/2009 1454   NITRITE negative 04/07/2014 1605   NITRITE negative 10/05/2009 1454   LEUKOCYTESUR Negative 04/07/2014 1605    STUDIES: No results found.  ASSESSMENT: 54 y.o. [place] woman status post right breast biopsy 12/26/2014 for a clinical T1 cN0, stage IA invasive ductal carcinoma, grade 1 or 2, estrogen and progesterone receptor positive, with an MIB-1 of 11%, and HER-2 amplified, with a signals ratio of 2.07, number per cell 2.28.  (1) status post right lumpectomy and sentinel lymph node sampling 01/23/2015 for a pT1c pN1, stage IIA invasive ductal carcinoma, grade 1, with focally positive anterior margins  (a) additional surgery for margin clearance 02/16/2015 followed residual disease  (2) chemotherapy will  start 03/05/2015, to consist of doxorubicin and cyclophosphamide in dose dense fashion 4 followed by paclitaxel weekly,  with trastuzumab and pertuzumab given every 3 weeks   (3) trastuzumab will be continued to complete 1 year  (a) echocardiogram 02/03/2015 shows a normal ejection fraction  (4) adjuvant radiation to follow chemotherapy  (5) anti-estrogens to follow radiation  PLAN: Cyrilla looks and feels well today. The labs were reviewed in detail and were entirely stable. She will proceed with cycle 3 of doxorubicin and cyclophosphamide as  planned today.   We discussed holding her home doses of dexamethasone, as she has had tremendous heartburn in the past. She knows to lean more heavily on the zofran and compazine.   I have written her a prescription for a z-pack due to her sinus headaches and mucus color changes. If she does not improve with sudafed in 3 days, she will have this prescription filled.   Chivonne will return in 1 week for labs and a nadir visit. She understands and agrees with this plan. She knows the goal of treatment in her case is cure. She has been encouraged to call with any issues that might arise before her next visit here.  Laurie Panda, NP   04/02/2015 10:22 AM

## 2015-04-02 NOTE — Patient Instructions (Signed)
[facility] Discharge Instructions for Patients Receiving Chemotherapy  Today you received the following chemotherapy agents: Adriamycin, Cytoxan  To help prevent nausea and vomiting after your treatment, we encourage you to take your nausea medication as prescribed by your physician.   If you develop nausea and vomiting that is not controlled by your nausea medication, call the clinic.   BELOW ARE SYMPTOMS THAT SHOULD BE REPORTED IMMEDIATELY:  *FEVER GREATER THAN 100.5 F  *CHILLS WITH OR WITHOUT FEVER  NAUSEA AND VOMITING THAT IS NOT CONTROLLED WITH YOUR NAUSEA MEDICATION  *UNUSUAL SHORTNESS OF BREATH  *UNUSUAL BRUISING OR BLEEDING  TENDERNESS IN MOUTH AND THROAT WITH OR WITHOUT PRESENCE OF ULCERS  *URINARY PROBLEMS  *BOWEL PROBLEMS  UNUSUAL RASH Items with * indicate a potential emergency and should be followed up as soon as possible.  Feel free to call the clinic you have any questions or concerns. The clinic phone number is [phone].  Please show the [place] at check-in to the Emergency Department and triage nurse.

## 2015-04-02 NOTE — Telephone Encounter (Signed)
Called and left a message with new appointments and she can get a new schedule 8/19

## 2015-04-04 ENCOUNTER — Ambulatory Visit: Payer: BLUE CROSS/BLUE SHIELD

## 2015-04-10 ENCOUNTER — Encounter: Payer: Self-pay | Admitting: Nurse Practitioner

## 2015-04-10 ENCOUNTER — Ambulatory Visit (HOSPITAL_BASED_OUTPATIENT_CLINIC_OR_DEPARTMENT_OTHER): Payer: BLUE CROSS/BLUE SHIELD | Admitting: Nurse Practitioner

## 2015-04-10 ENCOUNTER — Other Ambulatory Visit (HOSPITAL_BASED_OUTPATIENT_CLINIC_OR_DEPARTMENT_OTHER): Payer: BLUE CROSS/BLUE SHIELD

## 2015-04-10 VITALS — BP 126/68 | HR 88 | Temp 97.8°F | Resp 18 | Ht 66.0 in | Wt 157.4 lb

## 2015-04-10 DIAGNOSIS — R21 Rash and other nonspecific skin eruption: Secondary | ICD-10-CM

## 2015-04-10 DIAGNOSIS — Z170 Estrogen receptor positive status [ER+]: Secondary | ICD-10-CM | POA: Diagnosis not present

## 2015-04-10 DIAGNOSIS — L270 Generalized skin eruption due to drugs and medicaments taken internally: Secondary | ICD-10-CM | POA: Insufficient documentation

## 2015-04-10 DIAGNOSIS — C50411 Malignant neoplasm of upper-outer quadrant of right female breast: Secondary | ICD-10-CM | POA: Diagnosis not present

## 2015-04-10 LAB — CBC WITH DIFFERENTIAL/PLATELET
BASO%: 2.2 % — ABNORMAL HIGH (ref 0.0–2.0)
BASOS ABS: 0 10*3/uL (ref 0.0–0.1)
EOS ABS: 0 10*3/uL (ref 0.0–0.5)
EOS%: 0.5 % (ref 0.0–7.0)
HCT: 27.3 % — ABNORMAL LOW (ref 34.8–46.6)
HGB: 9.1 g/dL — ABNORMAL LOW (ref 11.6–15.9)
LYMPH%: 45.1 % (ref 14.0–49.7)
MCH: 31.4 pg (ref 25.1–34.0)
MCHC: 33.3 g/dL (ref 31.5–36.0)
MCV: 94.1 fL (ref 79.5–101.0)
MONO#: 0.6 10*3/uL (ref 0.1–0.9)
MONO%: 30.8 % — ABNORMAL HIGH (ref 0.0–14.0)
NEUT#: 0.4 10*3/uL — CL (ref 1.5–6.5)
NEUT%: 21.4 % — ABNORMAL LOW (ref 38.4–76.8)
NRBC: 1 % — AB (ref 0–0)
PLATELETS: 202 10*3/uL (ref 145–400)
RBC: 2.9 10*6/uL — AB (ref 3.70–5.45)
RDW: 14.4 % (ref 11.2–14.5)
WBC: 1.8 10*3/uL — ABNORMAL LOW (ref 3.9–10.3)
lymph#: 0.8 10*3/uL — ABNORMAL LOW (ref 0.9–3.3)

## 2015-04-10 LAB — COMPREHENSIVE METABOLIC PANEL (CC13)
ALBUMIN: 3.5 g/dL (ref 3.5–5.0)
ALK PHOS: 109 U/L (ref 40–150)
ALT: 15 U/L (ref 0–55)
ANION GAP: 8 meq/L (ref 3–11)
AST: 13 U/L (ref 5–34)
BUN: 8.8 mg/dL (ref 7.0–26.0)
CO2: 27 meq/L (ref 22–29)
Calcium: 8.8 mg/dL (ref 8.4–10.4)
Chloride: 105 mEq/L (ref 98–109)
Creatinine: 0.7 mg/dL (ref 0.6–1.1)
GLUCOSE: 98 mg/dL (ref 70–140)
POTASSIUM: 4 meq/L (ref 3.5–5.1)
Sodium: 140 mEq/L (ref 136–145)
TOTAL PROTEIN: 6.2 g/dL — AB (ref 6.4–8.3)

## 2015-04-10 NOTE — Progress Notes (Signed)
[name]  [phone] [phone]     ID: Bonnie Ward DOB: 1960-12-12  MR#: [id number]  MMH#:[id number]  Patient Care Team: Lollie Sails, MD as PCP - General (Internal Medicine) Milford Cage, OTR Kem Boroughs, FNP as Nurse Practitioner (Nurse Practitioner) Fanny Skates, MD as Consulting Physician (General Surgery) Chauncey Cruel, MD as Consulting Physician (Oncology) Thea Silversmith, MD as Consulting Physician (Radiation Oncology) Rockwell Germany, RN as Registered Nurse Mauro Kaufmann, RN as Registered Nurse Holley Bouche, NP as Nurse Practitioner (Nurse Practitioner) Kristeen Miss, MD as Consulting Physician (Neurosurgery) PCP: Lollie Sails, MD OTHER MD:  CHIEF COMPLAINT: Estrogen receptor positive breast cancer  CURRENT TREATMENT: adjuvant chemotherapy   BREAST CANCER HISTORY: From the initial intake note:   Bonnie Ward had routine bilateral screening mammography at the [facility] for 20 01/09/2015 showing a possible mass in the right breast. On 12/22/2014 she underwent diagnostic right mammography with tomosynthesis and right breast ultrasonography. The breast density was category C. In the right breast there was an area of increased density with architectural distortion and faint microcalcifications in the upper outer quadrant. There was mild palpable soft tissue thickening in the area in question, but no palpable right axillary lymph nodes. Ultrasound confirmed a 1.7 cm irregular hypoechoic mass. There were no abnormal-appearing right axillary lymph nodes.  Biopsy of the right breast mass in question 12/26/2014 showed ([facility] 16-08/08/2005) an invasive ductal carcinoma, grade 1 or 2, with a prognostic panel still pending.  Her subsequent history is as detailed below  INTERVAL HISTORY: Bonnie Ward returns today for follow-up of her breast cancer. Today is day 8, cycle 3 of doxorubicin and cyclophosphamide, with neulasta  given on day 2 for granulocyte support. She finished her zpak and no longer has any sinus symptoms. New this week is a generalized rash to her trunk and arms. It does not itch, but can be sensitive when brushed up against.   REVIEW OF SYSTEMS: [name] denies fevers or chills. Her nausea is no worse since we decided to stop the home doses of dexamethasone. Her heartburn is minimal on omeprazole daily. She is moving her bowels daily, but in small quantities. She feels bloated. She has gained some water weight, but thinks this is from some salty food she has had this week. She has had minimal joint pain from the neulasta this time. She is using valtrex for her mouth sores. A detailed review of systems is otherwise stable.  PAST MEDICAL HISTORY: Past Medical History  Diagnosis Date  . ACID REFLUX DISEASE 07/12/2010  . Arachnoid cyst     left frontal lobe  . Migraines   . Arthritis     hands  . Nervous stomach   . Difficulty swallowing pills   . Hypothyroidism   . Stress fracture of foot 12/2014    right  . Glaucoma   . Breast cancer 12/2014    right  . Anxiety   . Dental crowns present   . Seasonal allergies   . Complication of anesthesia 2012    states woke up during colonoscopy with Propofol    PAST SURGICAL HISTORY: Past Surgical History  Procedure Laterality Date  . Tonsillectomy  age 19  . Esophagogastroduodenoscopy endoscopy    . Tooth extraction    . Lasik    . Colonoscopy with propofol  10/14/2013  . Radioactive seed guided mastectomy with axillary sentinel lymph node biopsy Right 01/23/2015    Procedure: RIGHT PARTIAL MASTECTOMY WITH RADIOACTIVE SEED LOCALIZATION  WITH RIGHT AXILLARY SENTINEL LYMPH NODE BIOPSY;  Surgeon: Fanny Skates, MD;  Location: [facility];  Service: General;  Laterality: Right;  . Portacath placement Left 01/23/2015    Procedure: INSERTION PORT-A-CATH ;  Surgeon: Fanny Skates, MD;  Location: [facility];  Service: General;   Laterality: Left;  . Re-excision of breast lumpectomy Right 02/16/2015    Procedure: RE-EXCISION OF RIGHT  BREAST LUMPECTOMY MARGINS;  Surgeon: Fanny Skates, MD;  Location: [facility];  Service: General;  Laterality: Right;    FAMILY HISTORY Family History  Problem Relation Age of Onset  . Osteoporosis Mother   . Irritable bowel syndrome Mother   . Hypertension Mother   . Hypertension Father   . Hyperlipidemia Father   . Diabetes Father   . Dementia Father   . Colon cancer Maternal Grandfather 78  . Breast cancer Paternal Aunt   . Breast cancer Paternal Grandmother    the patient's father died at age 16 from pneumonia. [name] died at age 80 after bowel perforation. The patient had one brother, one sister. There is no history of breast or ovarian cancer in the family.  GYNECOLOGIC HISTORY:  Patient's last menstrual period was 01/25/2011. Menarche age 52, the patient is GX P0. She stopped having periods approximately 2011. She is still on hormone replacement, but is "trickling off it". She did take birth control pills for approximately 3 years remotely with no complications  SOCIAL HISTORY:  Bonnie Ward works as a Haematologist. She is divorced and lives by herself, with 2 dogs.    ADVANCED DIRECTIVES: The patient's brother [name] is her healthcare power of attorney. He may be reached in [place] at [place]: Social History  Substance Use Topics  . Smoking status: Former Smoker -- 0.00 packs/day for 0 years    Quit date: 02/19/2005  . Smokeless tobacco: Never Used  . Alcohol Use: Yes     Comment: occasionally     Colonoscopy: February 2016  PAP: June 2015  Bone density: October 2015  Lipid panel:  Allergies  Allergen Reactions  . Lortab [Hydrocodone-Acetaminophen] Anxiety  . Milk-Related Compounds Diarrhea    GI UPSET    Current Outpatient Prescriptions  Medication Sig Dispense Refill  . ALPRAZolam (XANAX) 0.25  MG tablet Take 0.25 mg by mouth at bedtime as needed for anxiety. 1/2 tab. daily    . azithromycin (ZITHROMAX) 250 MG tablet Follow as package instructs. 6 each 0  . bimatoprost (LUMIGAN) 0.03 % ophthalmic solution Place 1 drop into both eyes at bedtime.    Marland Kitchen dexamethasone (DECADRON) 4 MG tablet Take 2 tablets by mouth once a day on the day after chemotherapy and then take 2 tablets two times a day for 2 days. Take with food. 30 tablet 1  . lidocaine-prilocaine (EMLA) cream Apply to affected area once 30 g 3  . liothyronine (CYTOMEL) 5 MCG tablet Take 5 mcg by mouth 2 (two) times daily.    Marland Kitchen LORazepam (ATIVAN) 0.5 MG tablet Take 1 tablet (0.5 mg total) by mouth at bedtime. 30 tablet 0  . omeprazole (PRILOSEC) 40 MG capsule Take 1 capsule (40 mg total) by mouth daily. 30 capsule 3  . prochlorperazine (COMPAZINE) 10 MG tablet Take 1 tablet (10 mg total) by mouth every 6 (six) hours as needed (Nausea or vomiting). 30 tablet 1  . sucralfate (CARAFATE) 1 GM/10ML suspension Take 10 mLs (1 g total) by mouth 4 (four) times daily -  with meals and at bedtime. 420 mL 0  . valACYclovir (VALTREX) 500 MG tablet Take 1 tablet (500 mg total) by mouth daily. 30 tablet 3  . acetaminophen (TYLENOL) 325 MG tablet Take 650 mg by mouth every 6 (six) hours as needed.    . ondansetron (ZOFRAN) 8 MG tablet Take 1 tablet (8 mg total) by mouth 2 (two) times daily as needed. Start on the third day after chemotherapy. (Patient not taking: Reported on 03/13/2015) 30 tablet 1  . SUMAtriptan (IMITREX) 50 MG tablet TAKE 1 TABLET BY MOUTH AT THE ONSET OF HEADACHE. TAKE NO MORE THAN 4 TABLETS BY MOUTH IN 24 HOURS (Patient not taking: Reported on 03/27/2015) 9 tablet 5   No current facility-administered medications for this visit.    OBJECTIVE: Middle-aged white woman who appears well Filed Vitals:   04/10/15 1348  BP: 126/68  Pulse: 88  Temp: 97.8 F (36.6 C)  Resp: 18     Body mass index is 25.42 kg/(m^2).    ECOG FS:0 -  Asymptomatic  Skin: warm, dry, diffuse erythematous spotting to her abdomen and upper arms HEENT: sclerae anicteric, conjunctivae pink, oropharynx clear. No thrush or mucositis.  Lymph Nodes: No cervical or supraclavicular lymphadenopathy  Lungs: clear to auscultation bilaterally, no rales, wheezes, or rhonci  Heart: regular rate and rhythm  Abdomen: round, soft, non tender, positive bowel sounds  Musculoskeletal: No focal spinal tenderness, no peripheral edema  Neuro: non focal, well oriented, positive affect  Breasts: deferred  LAB RESULTS:  CMP     Component Value Date/Time   NA 140 04/10/2015 1334   NA 140 01/21/2009 0828   K 4.0 04/10/2015 1334   K 4.1 01/21/2009 0828   CL 108 01/21/2009 0828   CO2 27 04/10/2015 1334   CO2 30 01/21/2009 0828   GLUCOSE 98 04/10/2015 1334   GLUCOSE 90 01/21/2009 0828   BUN 8.8 04/10/2015 1334   BUN 13 01/21/2009 0828   CREATININE 0.7 04/10/2015 1334   CREATININE 0.7 01/21/2009 0828   CALCIUM 8.8 04/10/2015 1334   CALCIUM 8.5 01/21/2009 0828   PROT 6.2* 04/10/2015 1334   PROT 6.9 01/21/2009 0828   ALBUMIN 3.5 04/10/2015 1334   ALBUMIN 3.7 01/21/2009 0828   AST 13 04/10/2015 1334   AST 22 01/21/2009 0828   ALT 15 04/10/2015 1334   ALT 19 01/21/2009 0828   ALKPHOS 109 04/10/2015 1334   ALKPHOS 43 01/21/2009 0828   BILITOT <0.20 04/10/2015 1334   BILITOT 1.1 01/21/2009 0828   GFRNONAA 95.01 01/21/2009 0828    INo results found for: SPEP, UPEP  Lab Results  Component Value Date   WBC 1.8* 04/10/2015   NEUTROABS 0.4* 04/10/2015   HGB 9.1* 04/10/2015   HCT 27.3* 04/10/2015   MCV 94.1 04/10/2015   PLT 202 04/10/2015      Chemistry      Component Value Date/Time   NA 140 04/10/2015 1334   NA 140 01/21/2009 0828   K 4.0 04/10/2015 1334   K 4.1 01/21/2009 0828   CL 108 01/21/2009 0828   CO2 27 04/10/2015 1334   CO2 30 01/21/2009 0828   BUN 8.8 04/10/2015 1334   BUN 13 01/21/2009 0828   CREATININE 0.7 04/10/2015 1334    CREATININE 0.7 01/21/2009 0828      Component Value Date/Time   CALCIUM 8.8 04/10/2015 1334   CALCIUM 8.5 01/21/2009 0828   ALKPHOS 109 04/10/2015 1334   ALKPHOS 43 01/21/2009 0828   AST 13  04/10/2015 1334   AST 22 01/21/2009 0828   ALT 15 04/10/2015 1334   ALT 19 01/21/2009 0828   BILITOT <0.20 04/10/2015 1334   BILITOT 1.1 01/21/2009 0828       No results found for: LABCA2  No components found for: LABCA125  No results for input(s): INR in the last 168 hours.  Urinalysis    Component Value Date/Time   COLORURINE yellow 10/05/2009 1454   APPEARANCEUR Clear 10/05/2009 1454   LABSPEC 1.010 10/05/2009 1454   PHURINE 7.0 10/05/2009 1454   HGBUR 2+ 10/05/2009 1454   BILIRUBINUR negative 04/07/2014 1605   BILIRUBINUR negative 10/05/2009 1454   PROTEINUR negative 04/07/2014 1605   UROBILINOGEN negative 04/07/2014 1605   UROBILINOGEN 0.2 10/05/2009 1454   NITRITE negative 04/07/2014 1605   NITRITE negative 10/05/2009 1454   LEUKOCYTESUR Negative 04/07/2014 1605    STUDIES: No results found.  ASSESSMENT: 54 y.o. [place] woman status post right breast biopsy 12/26/2014 for a clinical T1 cN0, stage IA invasive ductal carcinoma, grade 1 or 2, estrogen and progesterone receptor positive, with an MIB-1 of 11%, and HER-2 amplified, with a signals ratio of 2.07, number per cell 2.28.  (1) status post right lumpectomy and sentinel lymph node sampling 01/23/2015 for a pT1c pN1, stage IIA invasive ductal carcinoma, grade 1, with focally positive anterior margins  (a) additional surgery for margin clearance 02/16/2015 followed residual disease  (2) chemotherapy will  start 03/05/2015, to consist of doxorubicin and cyclophosphamide in dose dense fashion 4 followed by paclitaxel weekly,  with trastuzumab and pertuzumab given every 3 weeks   (3) trastuzumab will be continued to complete 1 year  (a) echocardiogram 02/03/2015 shows a normal ejection fraction  (4) adjuvant radiation  to follow chemotherapy  (5) anti-estrogens to follow radiation  PLAN: Cova is doing well today. Eliminating the home doses of her dexamethasone was very helpful, and she would like to continue this practice with her last cycle of AC.   I do believe the source of her rash is going to be the zpak she finally finished this morning. She is going to try benadryl and pepcid at home, but with time this rash should disappear on its own. Of course she will have Dr. Jana Hakim look at it next week if it gets worse and not better.   [facility] will return in 1 week for labs and her 4th and final cycle of doxorubicin and cyclophosphamide. During this visit they may review her next regimen paclitaxel, with trastuzumab and pertuzumab. She will have a repeat echocardiogram before these drugs begin. She understands and agrees with this plan. She knows the goal of treatment in her case is cure. She has been encouraged to call with any issues that might arise before her next visit here.  Laurie Panda, NP   04/10/2015 2:32 PM

## 2015-04-13 ENCOUNTER — Encounter: Payer: Self-pay | Admitting: Nurse Practitioner

## 2015-04-13 ENCOUNTER — Ambulatory Visit (INDEPENDENT_AMBULATORY_CARE_PROVIDER_SITE_OTHER): Payer: BLUE CROSS/BLUE SHIELD | Admitting: Nurse Practitioner

## 2015-04-13 VITALS — BP 100/66 | HR 80 | Ht 66.5 in | Wt 154.0 lb

## 2015-04-13 DIAGNOSIS — Z01419 Encounter for gynecological examination (general) (routine) without abnormal findings: Secondary | ICD-10-CM

## 2015-04-13 DIAGNOSIS — R87619 Unspecified abnormal cytological findings in specimens from cervix uteri: Secondary | ICD-10-CM | POA: Diagnosis not present

## 2015-04-13 DIAGNOSIS — Z0000 Encounter for general adult medical examination without abnormal findings: Secondary | ICD-10-CM | POA: Diagnosis not present

## 2015-04-13 DIAGNOSIS — Z79890 Hormone replacement therapy (postmenopausal): Secondary | ICD-10-CM | POA: Diagnosis not present

## 2015-04-13 DIAGNOSIS — C50911 Malignant neoplasm of unspecified site of right female breast: Secondary | ICD-10-CM

## 2015-04-13 LAB — POCT URINALYSIS DIPSTICK
BILIRUBIN UA: NEGATIVE
Blood, UA: NEGATIVE
Glucose, UA: NEGATIVE
Ketones, UA: NEGATIVE
LEUKOCYTES UA: NEGATIVE
NITRITE UA: NEGATIVE
Protein, UA: NEGATIVE
UROBILINOGEN UA: NEGATIVE
pH, UA: 6

## 2015-04-13 NOTE — Patient Instructions (Signed)

## 2015-04-13 NOTE — Progress Notes (Signed)
Patient ID: Bonnie Ward, female   DOB: February 26, 1961, 54 y.o.   MRN: [id number] 54 y.o. G0P0 Divorced Caucasian Fe here for annual exam.  New diagnosis of Right breast cancer  12/2014.  On chemo with Dr. Jana Hakim.  Lumpectomy X 2 with lymph node dissection on 6/3 & 02/16/15.  Will have radiation treatment next in December.  Treated for sinus infection with Azithromycin and now has a rash.  PCP thinks most likely due to chemotherapy.  She and her ex husband are still friends and he has been supportive.  They are not SA.  Patient's last menstrual period was 01/25/2011.          Sexually active: No.  The current method of family planning is abstinence.    Exercising: Yes.    cardio when able, riding exercise bike and walking dogs Smoker:  no  Health Maintenance: Pap: 02/13/2012 normal with neg HR HPV MMG:  12/15/14, with diagnostic right showing GRADE I INVASIVE DUCTAL CARCINOMA with lymph node involvement Colonoscopy: 10/14/2013 wnl biopsy, repeat in 5 yeas BMD: 12/02/2013 Spine -1.8, Hip -2.2 (improved since 12/2006) TDaP: 08/23/2011 Labs:  [place]  Urine:  Negative   reports that she quit smoking about 10 years ago. She has never used smokeless tobacco. She reports that she drinks alcohol. She reports that she does not use illicit drugs.  Past Medical History  Diagnosis Date  . ACID REFLUX DISEASE 07/12/2010  . Arachnoid cyst     left frontal lobe  . Migraines   . Arthritis     hands  . Nervous stomach   . Difficulty swallowing pills   . Hypothyroidism   . Stress fracture of foot 12/2014    right  . Glaucoma   . Breast cancer 12/2014    right  . Anxiety   . Dental crowns present   . Seasonal allergies   . Complication of anesthesia 2012    states woke up during colonoscopy with Propofol    Past Surgical History  Procedure Laterality Date  . Tonsillectomy  age 44  . Esophagogastroduodenoscopy endoscopy    . Tooth extraction    . Lasik    . Colonoscopy with propofol   10/14/2013  . Radioactive seed guided mastectomy with axillary sentinel lymph node biopsy Right 01/23/2015    Procedure: RIGHT PARTIAL MASTECTOMY WITH RADIOACTIVE SEED LOCALIZATION  WITH RIGHT AXILLARY SENTINEL LYMPH NODE BIOPSY;  Surgeon: Fanny Skates, MD;  Location: [facility];  Service: General;  Laterality: Right;  . Portacath placement Left 01/23/2015    Procedure: INSERTION PORT-A-CATH ;  Surgeon: Fanny Skates, MD;  Location: [facility];  Service: General;  Laterality: Left;  . Re-excision of breast lumpectomy Right 02/16/2015    Procedure: RE-EXCISION OF RIGHT  BREAST LUMPECTOMY MARGINS;  Surgeon: Fanny Skates, MD;  Location: [facility];  Service: General;  Laterality: Right;    Current Outpatient Prescriptions  Medication Sig Dispense Refill  . acetaminophen (TYLENOL) 325 MG tablet Take 650 mg by mouth every 6 (six) hours as needed.    . ALPRAZolam (XANAX) 0.5 MG tablet Take 1 tablet by mouth as needed.  4  . bimatoprost (LUMIGAN) 0.03 % ophthalmic solution Place 1 drop into both eyes at bedtime.    . lidocaine-prilocaine (EMLA) cream Apply to affected area once 30 g 3  . liothyronine (CYTOMEL) 5 MCG tablet Take 5 mcg by mouth 2 (two) times daily.    Marland Kitchen LORazepam (ATIVAN) 0.5 MG tablet Take 1  tablet (0.5 mg total) by mouth at bedtime. 30 tablet 0  . LUMIGAN 0.01 % SOLN Place 1 drop into both eyes at bedtime.  0  . omeprazole (PRILOSEC) 40 MG capsule Take 1 capsule (40 mg total) by mouth daily. 30 capsule 3  . prochlorperazine (COMPAZINE) 10 MG tablet Take 1 tablet (10 mg total) by mouth every 6 (six) hours as needed (Nausea or vomiting). 30 tablet 1  . sucralfate (CARAFATE) 1 GM/10ML suspension Take 10 mLs (1 g total) by mouth 4 (four) times daily -  with meals and at bedtime. 420 mL 0  . SUMAtriptan (IMITREX) 50 MG tablet TAKE 1 TABLET BY MOUTH AT THE ONSET OF HEADACHE. TAKE NO MORE THAN 4 TABLETS BY MOUTH IN 24 HOURS 9 tablet 5  .  valACYclovir (VALTREX) 500 MG tablet Take 1 tablet (500 mg total) by mouth daily. 30 tablet 3  . ondansetron (ZOFRAN) 8 MG tablet Take 1 tablet (8 mg total) by mouth 2 (two) times daily as needed. Start on the third day after chemotherapy. (Patient not taking: Reported on 03/13/2015) 30 tablet 1   No current facility-administered medications for this visit.    Family History  Problem Relation Age of Onset  . Osteoporosis Mother   . Irritable bowel syndrome Mother   . Hypertension Mother   . Hypertension Father   . Hyperlipidemia Father   . Diabetes Father   . Dementia Father   . Colon cancer Maternal Grandfather 16  . Breast cancer Paternal Aunt   . Breast cancer Paternal Grandmother     ROS:  Pertinent items are noted in HPI.  Otherwise, a comprehensive ROS was negative.  Exam:   BP 100/66 mmHg  Pulse 80  Ht 5' 6.5" (1.689 m)  Wt 154 lb (69.854 kg)  BMI 24.49 kg/m2  LMP 01/25/2011 Height: 5' 6.5" (168.9 cm) Ht Readings from Last 3 Encounters:  04/13/15 5' 6.5" (1.689 m)  04/10/15 5\' 6"  (1.676 m)  04/02/15 5\' 6"  (1.676 m)    General appearance: alert, cooperative and appears stated age Head: Normocephalic, without obvious abnormality, atraumatic Neck: no adenopathy, supple, symmetrical, trachea midline and thyroid normal to inspection and palpation Lungs: clear to auscultation bilaterally Breasts: normal appearance, no masses or tenderness, positive findings: surgical changes on the right. Healing wound with scab. Heart: regular rate and rhythm Abdomen: soft, non-tender; no masses,  no organomegaly Extremities: extremities normal, atraumatic, no cyanosis or edema Skin: Skin color, texture, turgor normal. No rashes or lesions Lymph nodes: Cervical, supraclavicular, and axillary nodes normal. No abnormal inguinal nodes palpated Neurologic: Grossly normal   Pelvic: External genitalia:  no lesions              Urethra:  normal appearing urethra with no masses, tenderness  or lesions              Bartholin's and Skene's: normal                 Vagina: extreme atrophic vaginitis appearing vagina with normal color and discharge, no lesions              Cervix: anteverted              Pap taken: Yes.   Bimanual Exam:  Uterus:  normal size, contour, position, consistency, mobility, non-tender              Adnexa: no mass, fullness, tenderness  Rectovaginal: Confirms               Anus:  normal sphincter tone, no lesions  Chaperone present:  yes  A:  Well Woman with normal exam  Postmenopausal on bio-identical HRT with Dr. Sharol Roussel (did not do well on conventional HRT) from 11/2010 -  12/2014  S/P Right breast cancer with invasive ductal grade 1 and triple positive  (ER/PR/HER 2 Nu amplified) with lumpectomy 01/23/15 & 02/16/2015 Osteopenia borderline osteoporosis - conservative treatment [place] of osteoporosis both parents  Hypothyroid on replacement History of migraine HA's  P:   Reviewed health and wellness pertinent to exam  Pap smear as above  Mammogram is due 11/2015  She will use OTC lubrication prn  Counseled on breast self exam, mammography screening, adequate intake of calcium and vitamin D, diet and exercise return annually or prn  An After Visit Summary was printed and given to the patient.

## 2015-04-16 ENCOUNTER — Other Ambulatory Visit (HOSPITAL_BASED_OUTPATIENT_CLINIC_OR_DEPARTMENT_OTHER): Payer: BLUE CROSS/BLUE SHIELD

## 2015-04-16 ENCOUNTER — Other Ambulatory Visit: Payer: Self-pay | Admitting: Oncology

## 2015-04-16 ENCOUNTER — Ambulatory Visit (HOSPITAL_BASED_OUTPATIENT_CLINIC_OR_DEPARTMENT_OTHER): Payer: BLUE CROSS/BLUE SHIELD | Admitting: Oncology

## 2015-04-16 ENCOUNTER — Ambulatory Visit (HOSPITAL_BASED_OUTPATIENT_CLINIC_OR_DEPARTMENT_OTHER): Payer: BLUE CROSS/BLUE SHIELD

## 2015-04-16 VITALS — BP 114/71 | HR 79 | Temp 97.7°F | Resp 18 | Ht 66.5 in | Wt 155.0 lb

## 2015-04-16 DIAGNOSIS — Z5111 Encounter for antineoplastic chemotherapy: Secondary | ICD-10-CM

## 2015-04-16 DIAGNOSIS — Z170 Estrogen receptor positive status [ER+]: Secondary | ICD-10-CM | POA: Diagnosis not present

## 2015-04-16 DIAGNOSIS — Z5189 Encounter for other specified aftercare: Secondary | ICD-10-CM | POA: Diagnosis not present

## 2015-04-16 DIAGNOSIS — C50411 Malignant neoplasm of upper-outer quadrant of right female breast: Secondary | ICD-10-CM

## 2015-04-16 LAB — CBC WITH DIFFERENTIAL/PLATELET
BASO%: 0.4 % (ref 0.0–2.0)
BASOS ABS: 0 10*3/uL (ref 0.0–0.1)
EOS%: 0.2 % (ref 0.0–7.0)
Eosinophils Absolute: 0 10*3/uL (ref 0.0–0.5)
HEMATOCRIT: 30.2 % — AB (ref 34.8–46.6)
HGB: 10.2 g/dL — ABNORMAL LOW (ref 11.6–15.9)
LYMPH#: 1.1 10*3/uL (ref 0.9–3.3)
LYMPH%: 12.9 % — ABNORMAL LOW (ref 14.0–49.7)
MCH: 32.2 pg (ref 25.1–34.0)
MCHC: 33.7 g/dL (ref 31.5–36.0)
MCV: 95.5 fL (ref 79.5–101.0)
MONO#: 0.9 10*3/uL (ref 0.1–0.9)
MONO%: 11.3 % (ref 0.0–14.0)
NEUT#: 6.3 10*3/uL (ref 1.5–6.5)
NEUT%: 75.2 % (ref 38.4–76.8)
Platelets: 277 10*3/uL (ref 145–400)
RBC: 3.16 10*6/uL — AB (ref 3.70–5.45)
RDW: 15.1 % — ABNORMAL HIGH (ref 11.2–14.5)
WBC: 8.4 10*3/uL (ref 3.9–10.3)

## 2015-04-16 LAB — COMPREHENSIVE METABOLIC PANEL (CC13)
ALT: 16 U/L (ref 0–55)
AST: 16 U/L (ref 5–34)
Albumin: 3.6 g/dL (ref 3.5–5.0)
Alkaline Phosphatase: 93 U/L (ref 40–150)
Anion Gap: 8 mEq/L (ref 3–11)
BUN: 11.2 mg/dL (ref 7.0–26.0)
CHLORIDE: 107 meq/L (ref 98–109)
CO2: 27 meq/L (ref 22–29)
CREATININE: 0.7 mg/dL (ref 0.6–1.1)
Calcium: 9 mg/dL (ref 8.4–10.4)
EGFR: >90 mL/min/{1.73_m2} (ref >90)
GLUCOSE: 87 mg/dL (ref 70–140)
POTASSIUM: 4.5 meq/L (ref 3.5–5.1)
SODIUM: 142 meq/L (ref 136–145)
Total Bilirubin: 0.28 mg/dL (ref 0.20–1.20)
Total Protein: 6.3 g/dL — ABNORMAL LOW (ref 6.4–8.3)

## 2015-04-16 MED ORDER — HEPARIN SOD (PORK) LOCK FLUSH 100 UNIT/ML IV SOLN
500.0000 [IU] | Freq: Once | INTRAVENOUS | Status: AC | PRN
  Administered 2015-04-16: 500 [IU]
  Filled 2015-04-16: qty 5

## 2015-04-16 MED ORDER — PALONOSETRON HCL INJECTION 0.25 MG/5ML
INTRAVENOUS | Status: AC
  Filled 2015-04-16: qty 5

## 2015-04-16 MED ORDER — SODIUM CHLORIDE 0.9 % IV SOLN
Freq: Once | INTRAVENOUS | Status: AC
  Administered 2015-04-16: 10:00:00 via INTRAVENOUS

## 2015-04-16 MED ORDER — SODIUM CHLORIDE 0.9 % IJ SOLN
10.0000 mL | INTRAMUSCULAR | Status: DC | PRN
  Administered 2015-04-16: 10 mL
  Filled 2015-04-16: qty 10

## 2015-04-16 MED ORDER — CYCLOPHOSPHAMIDE CHEMO INJECTION 1 GM
600.0000 mg/m2 | Freq: Once | INTRAMUSCULAR | Status: AC
  Administered 2015-04-16: 1060 mg via INTRAVENOUS
  Filled 2015-04-16: qty 53

## 2015-04-16 MED ORDER — DOXORUBICIN HCL CHEMO IV INJECTION 2 MG/ML
60.0000 mg/m2 | Freq: Once | INTRAVENOUS | Status: AC
  Administered 2015-04-16: 106 mg via INTRAVENOUS
  Filled 2015-04-16: qty 53

## 2015-04-16 MED ORDER — SODIUM CHLORIDE 0.9 % IV SOLN
Freq: Once | INTRAVENOUS | Status: AC
  Administered 2015-04-16: 10:00:00 via INTRAVENOUS
  Filled 2015-04-16: qty 5

## 2015-04-16 MED ORDER — PALONOSETRON HCL INJECTION 0.25 MG/5ML
0.2500 mg | Freq: Once | INTRAVENOUS | Status: AC
  Administered 2015-04-16: 0.25 mg via INTRAVENOUS

## 2015-04-16 MED ORDER — PEGFILGRASTIM 6 MG/0.6ML ~~LOC~~ PSKT
6.0000 mg | PREFILLED_SYRINGE | Freq: Once | SUBCUTANEOUS | Status: AC
  Administered 2015-04-16: 6 mg via SUBCUTANEOUS
  Filled 2015-04-16: qty 0.6

## 2015-04-16 MED ORDER — METOCLOPRAMIDE HCL 5 MG PO TABS: 5.0000 mg | ORAL_TABLET | Freq: Four times a day (QID) | ORAL | Status: DC | PRN

## 2015-04-16 NOTE — Progress Notes (Signed)
Encounter reviewed by Dr. Brook Amundson C. Silva.  

## 2015-04-16 NOTE — Progress Notes (Signed)
[place]  [phone] [phone]     ID: Bonnie Ward DOB: 1960-12-27  MR#: [id number]  GYI#:[id number]  Patient Care Team: Lollie Sails, MD as PCP - General (Internal Medicine) Milford Cage, OTR Kem Boroughs, FNP as Nurse Practitioner (Nurse Practitioner) Fanny Skates, MD as Consulting Physician (General Surgery) Chauncey Cruel, MD as Consulting Physician (Oncology) Thea Silversmith, MD as Consulting Physician (Radiation Oncology) Rockwell Germany, RN as Registered Nurse Mauro Kaufmann, RN as Registered Nurse Holley Bouche, NP as Nurse Practitioner (Nurse Practitioner) Kristeen Miss, MD as Consulting Physician (Neurosurgery) PCP: Lollie Sails, MD OTHER MD:  CHIEF COMPLAINT: Estrogen receptor positive breast cancer  CURRENT TREATMENT: adjuvant chemotherapy   BREAST CANCER HISTORY: From the initial intake note:   Bonnie Ward had routine bilateral screening mammography at the [facility] for [facility] 01/09/2015 showing a possible mass in the right breast. On 12/22/2014 she underwent diagnostic right mammography with tomosynthesis and right breast ultrasonography. The breast density was category C. In the right breast there was an area of increased density with architectural distortion and faint microcalcifications in the upper outer quadrant. There was mild palpable soft tissue thickening in the area in question, but no palpable right axillary lymph nodes. Ultrasound confirmed a 1.7 cm irregular hypoechoic mass. There were no abnormal-appearing right axillary lymph nodes.  Biopsy of the right breast mass in question 12/26/2014 showed (SAA 16-08/08/2005) an invasive ductal carcinoma, grade 1 or 2, with a prognostic panel still pending.  Her subsequent history is as detailed below  INTERVAL HISTORY: Bonnie Ward returns today for follow-up of her breast cancer. Today is day 1, cycle 4 of doxorubicin and cyclophosphamide, with neulasta  given on day 2 for granulocyte support. This is her last cycle and she will be starting on weekly paclitaxel 04/30/2015   REVIEW OF SYSTEMS: Bonnie Ward has tolerated the chemotherapy so far generally well. The worse problem she has had is constipation. This is unusual given that many people have diarrhea with the pertuzumab. She has had no symptoms suggestive of congestive heart failure. She feels really loopy as she puts it from the Compazine. It is "too strong". She is also concerned about weight gain and we have stopped her oral steroids but she has still gained about 7 pounds even though she makes it to the gym at least twice a week and she walks her dog 3 times a day. She sleeps poorly, has a runny nose which is not responding to Claritin) but does respond to the steroids we give her pre-treatment), a little bit of sore throat and hoarseness at times, and has had some mouth sores, which she treats with Valtrex. She has some heartburn problems. She developed a rash after the recent Z-Pak, but she has taken Z-Pak's before with no change. She has a rare headache. She feels a little bit forgetful at times. Hot flashes are moderate. A detailed review of systems today was otherwise stable  PAST MEDICAL HISTORY: Past Medical History  Diagnosis Date  . ACID REFLUX DISEASE 07/12/2010  . Arachnoid cyst     left frontal lobe  . Migraines   . Arthritis     hands  . Nervous stomach   . Difficulty swallowing pills   . Hypothyroidism   . Stress fracture of foot 12/2014    right  . Glaucoma   . Breast cancer 12/2014    right  . Anxiety   . Dental crowns present   . Seasonal allergies   .  Complication of anesthesia 2012    states woke up during colonoscopy with Propofol    PAST SURGICAL HISTORY: Past Surgical History  Procedure Laterality Date  . Tonsillectomy  age 24  . Esophagogastroduodenoscopy endoscopy    . Tooth extraction    . Lasik    . Colonoscopy with propofol  10/14/2013  . Radioactive  seed guided mastectomy with axillary sentinel lymph node biopsy Right 01/23/2015    Procedure: RIGHT PARTIAL MASTECTOMY WITH RADIOACTIVE SEED LOCALIZATION  WITH RIGHT AXILLARY SENTINEL LYMPH NODE BIOPSY;  Surgeon: Fanny Skates, MD;  Location: [facility];  Service: General;  Laterality: Right;  . Portacath placement Left 01/23/2015    Procedure: INSERTION PORT-A-CATH ;  Surgeon: Fanny Skates, MD;  Location: [facility];  Service: General;  Laterality: Left;  . Re-excision of breast lumpectomy Right 02/16/2015    Procedure: RE-EXCISION OF RIGHT  BREAST LUMPECTOMY MARGINS;  Surgeon: Fanny Skates, MD;  Location: [facility];  Service: General;  Laterality: Right;    FAMILY HISTORY Family History  Problem Relation Age of Onset  . Osteoporosis Mother   . Irritable bowel syndrome Mother   . Hypertension Mother   . Hypertension Father   . Hyperlipidemia Father   . Diabetes Father   . Dementia Father   . Colon cancer Maternal Grandfather 81  . Breast cancer Paternal Aunt   . Breast cancer Paternal Grandmother    the patient's father died at age 57 from pneumonia. [name] died at age 25 after bowel perforation. The patient had one brother, one sister. There is no history of breast or ovarian cancer in the family.  GYNECOLOGIC HISTORY:  Patient's last menstrual period was 01/25/2011. Menarche age 43, the patient is GX P0. She stopped having periods approximately 2011. She is still on hormone replacement, but is "trickling off it". She did take birth control pills for approximately 3 years remotely with no complications  SOCIAL HISTORY:  Anyra works as a Haematologist. She is divorced and lives by herself, with 2 dogs.    ADVANCED DIRECTIVES: The patient's brother [name] is her healthcare power of attorney. He may be reached in [place] at [place]: Social History  Substance Use Topics  . Smoking status:  Former Smoker -- 0.00 packs/day for 0 years    Quit date: 02/19/2005  . Smokeless tobacco: Never Used  . Alcohol Use: Yes     Comment: occasionally     Colonoscopy: February 2016  PAP: June 2015  Bone density: October 2015  Lipid panel:  Allergies  Allergen Reactions  . Lortab [Hydrocodone-Acetaminophen] Anxiety  . Milk-Related Compounds Diarrhea    GI UPSET    Current Outpatient Prescriptions  Medication Sig Dispense Refill  . acetaminophen (TYLENOL) 325 MG tablet Take 650 mg by mouth every 6 (six) hours as needed.    . ALPRAZolam (XANAX) 0.5 MG tablet Take 1 tablet by mouth as needed.  4  . bimatoprost (LUMIGAN) 0.03 % ophthalmic solution Place 1 drop into both eyes at bedtime.    . lidocaine-prilocaine (EMLA) cream Apply to affected area once 30 g 3  . liothyronine (CYTOMEL) 5 MCG tablet Take 5 mcg by mouth 2 (two) times daily.    Bonnie Ward Kitchen LORazepam (ATIVAN) 0.5 MG tablet Take 1 tablet (0.5 mg total) by mouth at bedtime. 30 tablet 0  . LUMIGAN 0.01 % SOLN Place 1 drop into both eyes at bedtime.  0  . omeprazole (PRILOSEC) 40 MG  capsule Take 1 capsule (40 mg total) by mouth daily. 30 capsule 3  . ondansetron (ZOFRAN) 8 MG tablet Take 1 tablet (8 mg total) by mouth 2 (two) times daily as needed. Start on the third day after chemotherapy. (Patient not taking: Reported on 03/13/2015) 30 tablet 1  . prochlorperazine (COMPAZINE) 10 MG tablet Take 1 tablet (10 mg total) by mouth every 6 (six) hours as needed (Nausea or vomiting). 30 tablet 1  . sucralfate (CARAFATE) 1 GM/10ML suspension Take 10 mLs (1 g total) by mouth 4 (four) times daily -  with meals and at bedtime. 420 mL 0  . SUMAtriptan (IMITREX) 50 MG tablet TAKE 1 TABLET BY MOUTH AT THE ONSET OF HEADACHE. TAKE NO MORE THAN 4 TABLETS BY MOUTH IN 24 HOURS 9 tablet 5  . valACYclovir (VALTREX) 500 MG tablet Take 1 tablet (500 mg total) by mouth daily. 30 tablet 3   No current facility-administered medications for this visit.     OBJECTIVE: Middle-aged white woman in no acute distress Filed Vitals:   04/16/15 0845  BP: 114/71  Pulse: 79  Temp: 97.7 F (36.5 C)  Resp: 18     Body mass index is 24.65 kg/(m^2).    ECOG FS:1 - Symptomatic but completely ambulatory  Sclerae unicteric, EOMs intact Oropharynx clear, dentition in good repair No cervical or supraclavicular adenopathy Lungs no rales or rhonchi Heart regular rate and rhythm Abd soft, nontender, positive bowel sounds MSK no focal spinal tenderness, no upper extremity lymphedema Neuro: nonfocal, well oriented, appropriate affect Breasts: Deferred Skin: Scattered erythematous acneform rash involving areas of the arms and torso, not face  LAB RESULTS:  CMP     Component Value Date/Time   NA 142 04/16/2015 0822   NA 140 01/21/2009 0828   K 4.5 04/16/2015 0822   K 4.1 01/21/2009 0828   CL 108 01/21/2009 0828   CO2 27 04/16/2015 0822   CO2 30 01/21/2009 0828   GLUCOSE 87 04/16/2015 0822   GLUCOSE 90 01/21/2009 0828   BUN 11.2 04/16/2015 0822   BUN 13 01/21/2009 0828   CREATININE 0.7 04/16/2015 0822   CREATININE 0.7 01/21/2009 0828   CALCIUM 9.0 04/16/2015 0822   CALCIUM 8.5 01/21/2009 0828   PROT 6.3* 04/16/2015 0822   PROT 6.9 01/21/2009 0828   ALBUMIN 3.6 04/16/2015 0822   ALBUMIN 3.7 01/21/2009 0828   AST 16 04/16/2015 0822   AST 22 01/21/2009 0828   ALT 16 04/16/2015 0822   ALT 19 01/21/2009 0828   ALKPHOS 93 04/16/2015 0822   ALKPHOS 43 01/21/2009 0828   BILITOT 0.28 04/16/2015 0822   BILITOT 1.1 01/21/2009 0828   GFRNONAA 95.01 01/21/2009 0828    INo results found for: SPEP, UPEP  Lab Results  Component Value Date   WBC 8.4 04/16/2015   NEUTROABS 6.3 04/16/2015   HGB 10.2* 04/16/2015   HCT 30.2* 04/16/2015   MCV 95.5 04/16/2015   PLT 277 04/16/2015      Chemistry      Component Value Date/Time   NA 142 04/16/2015 0822   NA 140 01/21/2009 0828   K 4.5 04/16/2015 0822   K 4.1 01/21/2009 0828   CL 108  01/21/2009 0828   CO2 27 04/16/2015 0822   CO2 30 01/21/2009 0828   BUN 11.2 04/16/2015 0822   BUN 13 01/21/2009 0828   CREATININE 0.7 04/16/2015 0822   CREATININE 0.7 01/21/2009 0828      Component Value Date/Time   CALCIUM 9.0  04/16/2015 0822   CALCIUM 8.5 01/21/2009 0828   ALKPHOS 93 04/16/2015 0822   ALKPHOS 43 01/21/2009 0828   AST 16 04/16/2015 0822   AST 22 01/21/2009 0828   ALT 16 04/16/2015 0822   ALT 19 01/21/2009 0828   BILITOT 0.28 04/16/2015 0822   BILITOT 1.1 01/21/2009 0828       No results found for: LABCA2  No components found for: LABCA125  No results for input(s): INR in the last 168 hours.  Urinalysis    Component Value Date/Time   COLORURINE yellow 10/05/2009 1454   APPEARANCEUR Clear 10/05/2009 1454   LABSPEC 1.010 10/05/2009 1454   PHURINE 7.0 10/05/2009 1454   HGBUR 2+ 10/05/2009 1454   BILIRUBINUR neg 04/13/2015 1605   BILIRUBINUR negative 10/05/2009 1454   PROTEINUR neg 04/13/2015 1605   UROBILINOGEN negative 04/13/2015 1605   UROBILINOGEN 0.2 10/05/2009 1454   NITRITE neg 04/13/2015 1605   NITRITE negative 10/05/2009 1454   LEUKOCYTESUR Negative 04/13/2015 1605    STUDIES: No results found.  ASSESSMENT: 54 y.o. [place] woman status post right breast biopsy 12/26/2014 for a clinical T1 cN0, stage IA invasive ductal carcinoma, grade 1 or 2, estrogen and progesterone receptor positive, with an MIB-1 of 11%, and HER-2 amplified, with a signals ratio of 2.07, number per cell 2.28.  (1) status post right lumpectomy and sentinel lymph node sampling 01/23/2015 for a pT1c pN1, stage IIA invasive ductal carcinoma, grade 1, with focally positive anterior margins  (a) additional surgery for margin clearance 02/16/2015 followed residual disease  (2) chemotherapy starting 03/05/2015, to consist of doxorubicin and cyclophosphamide in dose dense fashion 4, to be followed by paclitaxel weekly,  with trastuzumab and pertuzumab given every 3 weeks    (3) trastuzumab will be continued to complete 1 year  (a) echocardiogram 02/03/2015 shows a normal ejection fraction  (4) adjuvant radiation to follow chemotherapy  (5) anti-estrogens to follow radiation  PLAN: Minette we will completed the more intense portion of her chemotherapy today. Overall she has done remarkably well with this, and has been able to continue to work. She has been having problems with the Compazine and we are stopping that drug. I am putting her on metoclopramide at a lower dose, 5 mg 4 times a day. Hopefully this will not affect her thinking so much. In addition Reglan can help with the constipation problem.  She is going to be starting weekly paclitaxel in 2 weeks. Today we discussed the possible toxicities, side effects and complications of this agent. Generally it is much better tolerated. The first dose however can cause some unusual reactions. 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 I asked her to discuss that after her first Taxol dose when she will have at some experience and how she actually will feel with those treatments.  I commended her for excellent exercise program!  She will see [place] again in a week. She knows to call for any problems that may develop before that visit.     Chauncey Cruel, MD   04/16/2015 9:22 AM

## 2015-04-16 NOTE — Addendum Note (Signed)
Addended by: Ignacia Felling on: 04/16/2015 11:19 AM   Modules accepted: Orders

## 2015-04-16 NOTE — Patient Instructions (Signed)
[facility] Discharge Instructions for Patients Receiving Chemotherapy  Today you received the following chemotherapy agents: Adriamycin, Cytoxan  To help prevent nausea and vomiting after your treatment, we encourage you to take your nausea medication as prescribed by your physician.   If you develop nausea and vomiting that is not controlled by your nausea medication, call the clinic.   BELOW ARE SYMPTOMS THAT SHOULD BE REPORTED IMMEDIATELY:  *FEVER GREATER THAN 100.5 F  *CHILLS WITH OR WITHOUT FEVER  NAUSEA AND VOMITING THAT IS NOT CONTROLLED WITH YOUR NAUSEA MEDICATION  *UNUSUAL SHORTNESS OF BREATH  *UNUSUAL BRUISING OR BLEEDING  TENDERNESS IN MOUTH AND THROAT WITH OR WITHOUT PRESENCE OF ULCERS  *URINARY PROBLEMS  *BOWEL PROBLEMS  UNUSUAL RASH Items with * indicate a potential emergency and should be followed up as soon as possible.  Feel free to call the clinic you have any questions or concerns. The clinic phone number is [phone].  Please show the [place] at check-in to the Emergency Department and triage nurse.

## 2015-04-17 LAB — IPS PAP TEST WITH HPV

## 2015-04-17 NOTE — Addendum Note (Signed)
Addended by: Kem Boroughs R on: 04/17/2015 12:10 PM   Modules accepted: Orders

## 2015-04-18 ENCOUNTER — Ambulatory Visit: Payer: BLUE CROSS/BLUE SHIELD

## 2015-04-20 LAB — IPS HPV GENOTYPING 16/18

## 2015-04-22 ENCOUNTER — Telehealth: Payer: Self-pay | Admitting: Emergency Medicine

## 2015-04-22 ENCOUNTER — Other Ambulatory Visit: Payer: Self-pay | Admitting: *Deleted

## 2015-04-22 DIAGNOSIS — C50411 Malignant neoplasm of upper-outer quadrant of right female breast: Secondary | ICD-10-CM

## 2015-04-22 NOTE — Telephone Encounter (Signed)
-----   Message from Kem Boroughs, [place] sent at 04/20/2015  5:39 PM EDT ----- Please let pt. Know that pap was normal but + HPV.  We then added genotype # 16 & 18 and both were negative.   She needs repeat pap in 1 year 08 recall.

## 2015-04-22 NOTE — Telephone Encounter (Signed)
Message left to return call to Bonnie Ward at [phone].    

## 2015-04-23 ENCOUNTER — Other Ambulatory Visit (HOSPITAL_BASED_OUTPATIENT_CLINIC_OR_DEPARTMENT_OTHER): Payer: BLUE CROSS/BLUE SHIELD

## 2015-04-23 ENCOUNTER — Encounter: Payer: Self-pay | Admitting: Nurse Practitioner

## 2015-04-23 ENCOUNTER — Ambulatory Visit (HOSPITAL_BASED_OUTPATIENT_CLINIC_OR_DEPARTMENT_OTHER): Payer: BLUE CROSS/BLUE SHIELD | Admitting: Nurse Practitioner

## 2015-04-23 VITALS — BP 116/80 | HR 93 | Temp 98.0°F | Resp 18 | Ht 66.5 in | Wt 153.6 lb

## 2015-04-23 DIAGNOSIS — R21 Rash and other nonspecific skin eruption: Secondary | ICD-10-CM | POA: Diagnosis not present

## 2015-04-23 DIAGNOSIS — T451X5A Adverse effect of antineoplastic and immunosuppressive drugs, initial encounter: Secondary | ICD-10-CM

## 2015-04-23 DIAGNOSIS — D649 Anemia, unspecified: Secondary | ICD-10-CM

## 2015-04-23 DIAGNOSIS — C50411 Malignant neoplasm of upper-outer quadrant of right female breast: Secondary | ICD-10-CM | POA: Diagnosis not present

## 2015-04-23 DIAGNOSIS — G4700 Insomnia, unspecified: Secondary | ICD-10-CM | POA: Diagnosis not present

## 2015-04-23 DIAGNOSIS — Z170 Estrogen receptor positive status [ER+]: Secondary | ICD-10-CM

## 2015-04-23 DIAGNOSIS — D6481 Anemia due to antineoplastic chemotherapy: Secondary | ICD-10-CM

## 2015-04-23 LAB — COMPREHENSIVE METABOLIC PANEL (CC13)
ALBUMIN: 3.6 g/dL (ref 3.5–5.0)
ALK PHOS: 127 U/L (ref 40–150)
ALT: 11 U/L (ref 0–55)
AST: 12 U/L (ref 5–34)
Anion Gap: 7 mEq/L (ref 3–11)
BUN: 11.8 mg/dL (ref 7.0–26.0)
CALCIUM: 9 mg/dL (ref 8.4–10.4)
CO2: 27 mEq/L (ref 22–29)
CREATININE: 0.7 mg/dL (ref 0.6–1.1)
Chloride: 104 mEq/L (ref 98–109)
EGFR: >90 mL/min/{1.73_m2} (ref >90)
Glucose: 127 mg/dl (ref 70–140)
POTASSIUM: 4.4 meq/L (ref 3.5–5.1)
Sodium: 138 mEq/L (ref 136–145)
Total Bilirubin: 0.65 mg/dL (ref 0.20–1.20)
Total Protein: 6.4 g/dL (ref 6.4–8.3)

## 2015-04-23 LAB — CBC WITH DIFFERENTIAL/PLATELET
BASO%: 1.2 % (ref 0.0–2.0)
BASOS ABS: 0 10*3/uL (ref 0.0–0.1)
EOS ABS: 0 10*3/uL (ref 0.0–0.5)
EOS%: 0.1 % (ref 0.0–7.0)
HEMATOCRIT: 27.9 % — AB (ref 34.8–46.6)
HEMOGLOBIN: 9.5 g/dL — AB (ref 11.6–15.9)
LYMPH#: 0.3 10*3/uL — AB (ref 0.9–3.3)
LYMPH%: 26.8 % (ref 14.0–49.7)
MCH: 32.2 pg (ref 25.1–34.0)
MCHC: 33.9 g/dL (ref 31.5–36.0)
MCV: 95.1 fL (ref 79.5–101.0)
MONO#: 0.2 10*3/uL (ref 0.1–0.9)
MONO%: 12.3 % (ref 0.0–14.0)
NEUT#: 0.8 10*3/uL — ABNORMAL LOW (ref 1.5–6.5)
NEUT%: 59.6 % (ref 38.4–76.8)
PLATELETS: 223 10*3/uL (ref 145–400)
RBC: 2.93 10*6/uL — ABNORMAL LOW (ref 3.70–5.45)
RDW: 16.2 % — AB (ref 11.2–14.5)
WBC: 1.3 10*3/uL — ABNORMAL LOW (ref 3.9–10.3)

## 2015-04-23 MED ORDER — TRIAMCINOLONE ACETONIDE 0.1 % EX CREA: 1.0000 "application " | TOPICAL_CREAM | Freq: Two times a day (BID) | CUTANEOUS | Status: DC

## 2015-04-23 NOTE — Telephone Encounter (Signed)
Message left to return call to [place] at [phone].  Did not leave detailed message. Advised would like to speak with her regarding message at her convenience. If I am not available, to please ask for Manuelito or Gay Filler.

## 2015-04-23 NOTE — Progress Notes (Signed)
[name]  [phone] [phone]     ID: Bonnie Ward DOB: September 10, 1960  MR#: [id number]  AJO#:[id number]  Patient Care Team: Lollie Sails, MD as PCP - General (Internal Medicine) Milford Cage, OTR Kem Boroughs, FNP as Nurse Practitioner (Nurse Practitioner) Fanny Skates, MD as Consulting Physician (General Surgery) Chauncey Cruel, MD as Consulting Physician (Oncology) Thea Silversmith, MD as Consulting Physician (Radiation Oncology) Rockwell Germany, RN as Registered Nurse Mauro Kaufmann, RN as Registered Nurse Holley Bouche, NP as Nurse Practitioner (Nurse Practitioner) Kristeen Miss, MD as Consulting Physician (Neurosurgery) PCP: Lollie Sails, MD OTHER MD:  CHIEF COMPLAINT: Estrogen receptor positive breast cancer  CURRENT TREATMENT: adjuvant chemotherapy   BREAST CANCER HISTORY: From the initial intake note:   Bonnie Ward had routine bilateral screening mammography at the [facility] for 20 01/09/2015 showing a possible mass in the right breast. On 12/22/2014 she underwent diagnostic right mammography with tomosynthesis and right breast ultrasonography. The breast density was category C. In the right breast there was an area of increased density with architectural distortion and faint microcalcifications in the upper outer quadrant. There was mild palpable soft tissue thickening in the area in question, but no palpable right axillary lymph nodes. Ultrasound confirmed a 1.7 cm irregular hypoechoic mass. There were no abnormal-appearing right axillary lymph nodes.  Biopsy of the right breast mass in question 12/26/2014 showed (SAA 16-08/08/2005) an invasive ductal carcinoma, grade 1 or 2, with a prognostic panel still pending.  Her subsequent history is as detailed below  INTERVAL HISTORY: Bonnie Ward returns today for follow-up of her breast cancer. Today is day 8, cycle 4 of doxorubicin and cyclophosphamide, with neulasta  given on day 2 for granulocyte support.   REVIEW OF SYSTEMS: This last cycle went well for Bonnie Ward. She denies fevers or chills. She had mild nausea well controlled with antiemetics. She is moving her bowels daily. Her rash has intensified since this last dose of chemo. It is no longer on her torso, but widespread on her chest, arms, and legs. It is pruritic. She has been applying cortisone and this is somewhat helpful. She never tried any benadryl. She is not sleeping well at night and hasn't used any ativan. She has fatigue during the day. Her headaches have not been as severe or frequent. A detailed review of systems is otherwise stable.  PAST MEDICAL HISTORY: Past Medical History  Diagnosis Date  . ACID REFLUX DISEASE 07/12/2010  . Arachnoid cyst     left frontal lobe  . Migraines   . Arthritis     hands  . Nervous stomach   . Difficulty swallowing pills   . Hypothyroidism   . Stress fracture of foot 12/2014    right  . Glaucoma   . Breast cancer 12/2014    right  . Anxiety   . Dental crowns present   . Seasonal allergies   . Complication of anesthesia 2012    states woke up during colonoscopy with Propofol    PAST SURGICAL HISTORY: Past Surgical History  Procedure Laterality Date  . Tonsillectomy  age 18  . Esophagogastroduodenoscopy endoscopy    . Tooth extraction    . Lasik    . Colonoscopy with propofol  10/14/2013  . Radioactive seed guided mastectomy with axillary sentinel lymph node biopsy Right 01/23/2015    Procedure: RIGHT PARTIAL MASTECTOMY WITH RADIOACTIVE SEED LOCALIZATION  WITH RIGHT AXILLARY SENTINEL LYMPH NODE BIOPSY;  Surgeon: Fanny Skates, MD;  Location: [facility];  Service: General;  Laterality: Right;  . Portacath placement Left 01/23/2015    Procedure: INSERTION PORT-A-CATH ;  Surgeon: Fanny Skates, MD;  Location: [facility];  Service: General;  Laterality: Left;  . Re-excision of breast lumpectomy Right 02/16/2015     Procedure: RE-EXCISION OF RIGHT  BREAST LUMPECTOMY MARGINS;  Surgeon: Fanny Skates, MD;  Location: [facility];  Service: General;  Laterality: Right;    FAMILY HISTORY Family History  Problem Relation Age of Onset  . Osteoporosis Mother   . Irritable bowel syndrome Mother   . Hypertension Mother   . Hypertension Father   . Hyperlipidemia Father   . Diabetes Father   . Dementia Father   . Colon cancer Maternal Grandfather 57  . Breast cancer Paternal Aunt   . Breast cancer Paternal Grandmother    the patient's father died at age 37 from pneumonia. [name] died at age 72 after bowel perforation. The patient had one brother, one sister. There is no history of breast or ovarian cancer in the family.  GYNECOLOGIC HISTORY:  Patient's last menstrual period was 01/25/2011. Menarche age 51, the patient is GX P0. She stopped having periods approximately 2011. She is still on hormone replacement, but is "trickling off it". She did take birth control pills for approximately 3 years remotely with no complications  SOCIAL HISTORY:  Bonnie Ward works as a Haematologist. She is divorced and lives by herself, with 2 dogs.    ADVANCED DIRECTIVES: The patient's brother [name] is her healthcare power of attorney. He may be reached in [place] at [place]: Social History  Substance Use Topics  . Smoking status: Former Smoker -- 0.00 packs/day for 0 years    Quit date: 02/19/2005  . Smokeless tobacco: Never Used  . Alcohol Use: Yes     Comment: occasionally     Colonoscopy: February 2016  PAP: June 2015  Bone density: October 2015  Lipid panel:  Allergies  Allergen Reactions  . Lortab [Hydrocodone-Acetaminophen] Anxiety  . Milk-Related Compounds Diarrhea    GI UPSET    Current Outpatient Prescriptions  Medication Sig Dispense Refill  . acetaminophen (TYLENOL) 325 MG tablet Take 650 mg by mouth every 6 (six) hours as needed.    .  ALPRAZolam (XANAX) 0.5 MG tablet Take 1 tablet by mouth as needed.  4  . bimatoprost (LUMIGAN) 0.03 % ophthalmic solution Place 1 drop into both eyes at bedtime.    . lidocaine-prilocaine (EMLA) cream APP EXT AA 1 TIME  3  . liothyronine (CYTOMEL) 5 MCG tablet Take 5 mcg by mouth 2 (two) times daily.    Marland Kitchen LORazepam (ATIVAN) 0.5 MG tablet TK 1 T PO  HS  0  . metoCLOPramide (REGLAN) 5 MG tablet Take 1 tablet (5 mg total) by mouth every 6 (six) hours as needed for nausea. 60 tablet 2  . omeprazole (PRILOSEC) 40 MG capsule Take 1 capsule (40 mg total) by mouth daily. 30 capsule 3  . valACYclovir (VALTREX) 500 MG tablet Take 1 tablet (500 mg total) by mouth daily. 30 tablet 3  . LUMIGAN 0.01 % SOLN Place 1 drop into both eyes at bedtime.  0  . ondansetron (ZOFRAN) 8 MG tablet   1  . sucralfate (CARAFATE) 1 GM/10ML suspension Take 10 mLs (1 g total) by mouth 4 (four) times daily -  with meals and at bedtime. (Patient not taking: Reported on 04/23/2015) 420 mL 0  . SUMAtriptan (  IMITREX) 50 MG tablet TAKE 1 TABLET BY MOUTH AT THE ONSET OF HEADACHE. TAKE NO MORE THAN 4 TABLETS BY MOUTH IN 24 HOURS (Patient not taking: Reported on 04/23/2015) 9 tablet 5  . triamcinolone cream (KENALOG) 0.1 % Apply 1 application topically 2 (two) times daily. 45 g 0   No current facility-administered medications for this visit.   Facility-Administered Medications Ordered in Other Visits  Medication Dose Route Frequency Provider Last Rate Last Dose  . sodium chloride 0.9 % injection 10 mL  10 mL Intracatheter PRN Chauncey Cruel, MD   10 mL at 04/16/15 1209    OBJECTIVE: Middle-aged white woman in no acute distress Filed Vitals:   04/23/15 0836  BP: 116/80  Pulse: 93  Temp: 98 F (36.7 C)  Resp: 18     Body mass index is 24.42 kg/(m^2).    ECOG FS:1 - Symptomatic but completely ambulatory  Skin: warm, dry, scattered acniform rash to legs, arms, and chest HEENT: sclerae anicteric, conjunctivae pink, oropharynx  clear. No thrush or mucositis.  Lymph Nodes: No cervical or supraclavicular lymphadenopathy  Lungs: clear to auscultation bilaterally, no rales, wheezes, or rhonci  Heart: regular rate and rhythm  Abdomen: round, soft, non tender, positive bowel sounds  Musculoskeletal: No focal spinal tenderness, no peripheral edema  Neuro: non focal, well oriented, positive affect  Breasts: deferred  LAB RESULTS:  CMP     Component Value Date/Time   NA 142 04/16/2015 0822   NA 140 01/21/2009 0828   K 4.5 04/16/2015 0822   K 4.1 01/21/2009 0828   CL 108 01/21/2009 0828   CO2 27 04/16/2015 0822   CO2 30 01/21/2009 0828   GLUCOSE 87 04/16/2015 0822   GLUCOSE 90 01/21/2009 0828   BUN 11.2 04/16/2015 0822   BUN 13 01/21/2009 0828   CREATININE 0.7 04/16/2015 0822   CREATININE 0.7 01/21/2009 0828   CALCIUM 9.0 04/16/2015 0822   CALCIUM 8.5 01/21/2009 0828   PROT 6.3* 04/16/2015 0822   PROT 6.9 01/21/2009 0828   ALBUMIN 3.6 04/16/2015 0822   ALBUMIN 3.7 01/21/2009 0828   AST 16 04/16/2015 0822   AST 22 01/21/2009 0828   ALT 16 04/16/2015 0822   ALT 19 01/21/2009 0828   ALKPHOS 93 04/16/2015 0822   ALKPHOS 43 01/21/2009 0828   BILITOT 0.28 04/16/2015 0822   BILITOT 1.1 01/21/2009 0828   GFRNONAA 95.01 01/21/2009 0828    INo results found for: SPEP, UPEP  Lab Results  Component Value Date   WBC 1.3* 04/23/2015   NEUTROABS 0.8* 04/23/2015   HGB 9.5* 04/23/2015   HCT 27.9* 04/23/2015   MCV 95.1 04/23/2015   PLT 223 04/23/2015      Chemistry      Component Value Date/Time   NA 142 04/16/2015 0822   NA 140 01/21/2009 0828   K 4.5 04/16/2015 0822   K 4.1 01/21/2009 0828   CL 108 01/21/2009 0828   CO2 27 04/16/2015 0822   CO2 30 01/21/2009 0828   BUN 11.2 04/16/2015 0822   BUN 13 01/21/2009 0828   CREATININE 0.7 04/16/2015 0822   CREATININE 0.7 01/21/2009 0828      Component Value Date/Time   CALCIUM 9.0 04/16/2015 0822   CALCIUM 8.5 01/21/2009 0828   ALKPHOS 93 04/16/2015  0822   ALKPHOS 43 01/21/2009 0828   AST 16 04/16/2015 0822   AST 22 01/21/2009 0828   ALT 16 04/16/2015 0822   ALT 19 01/21/2009 0828   BILITOT  0.28 04/16/2015 0822   BILITOT 1.1 01/21/2009 0828       No results found for: LABCA2  No components found for: QMGNO037  No results for input(s): INR in the last 168 hours.  Urinalysis    Component Value Date/Time   COLORURINE yellow 10/05/2009 1454   APPEARANCEUR Clear 10/05/2009 1454   LABSPEC 1.010 10/05/2009 1454   PHURINE 7.0 10/05/2009 1454   HGBUR 2+ 10/05/2009 1454   BILIRUBINUR neg 04/13/2015 1605   BILIRUBINUR negative 10/05/2009 1454   PROTEINUR neg 04/13/2015 1605   UROBILINOGEN negative 04/13/2015 1605   UROBILINOGEN 0.2 10/05/2009 1454   NITRITE neg 04/13/2015 1605   NITRITE negative 10/05/2009 1454   LEUKOCYTESUR Negative 04/13/2015 1605    STUDIES: No results found.  ASSESSMENT: 54 y.o. [place] woman status post right breast biopsy 12/26/2014 for a clinical T1 cN0, stage IA invasive ductal carcinoma, grade 1 or 2, estrogen and progesterone receptor positive, with an MIB-1 of 11%, and HER-2 amplified, with a signals ratio of 2.07, number per cell 2.28.  (1) status post right lumpectomy and sentinel lymph node sampling 01/23/2015 for a pT1c pN1, stage IIA invasive ductal carcinoma, grade 1, with focally positive anterior margins  (a) additional surgery for margin clearance 02/16/2015 followed residual disease  (2) chemotherapy starting 03/05/2015, to consist of doxorubicin and cyclophosphamide in dose dense fashion 4, to be followed by paclitaxel weekly,  with trastuzumab and pertuzumab given every 3 weeks   (3) trastuzumab will be continued to complete 1 year  (a) echocardiogram 02/03/2015 shows a normal ejection fraction  (4) adjuvant radiation to follow chemotherapy  (5) anti-estrogens to follow radiation  PLAN: Bonnie Ward is doing well overall today. The labs were reviewed in detail.Marland Kitchen Her ANC is 0.9  today, so she will not require prophylactic antibiotics this round. She is slightly more anemic at 9.5, but she is asymptomatic.  I prescribed triamcinolone cream to apply to her rash. She will take benadryl nightly, and this should not only help clear the rash but help her sleep as well.    Bonnie Ward will return in 1 week for cycle 1 of paclitaxel along with trastuzumab and pertuzumab. Prior to this, she will have a repeat echocardiogram. She understands and agrees with this plan. She knows the goal of treatment in he case is cure. She has been encouraged to call with any issues that might arise before her next visit here.   Laurie Panda, NP   04/23/2015 9:02 AM

## 2015-04-23 NOTE — Telephone Encounter (Signed)
Patient returning call.  Ok to leave a detailed message.

## 2015-04-23 NOTE — Telephone Encounter (Signed)
Late entry for 1351: Patient returned call and she is given message from Milford Cage, [place]. Patient given normal pap smear results and HR HPV Results, however, negative 16/18 subtype.  Patient currently receiving chemotherapy for breast cancer. Not sexually active.  Patient agreeable to appointment in one year, has appointment scheduled. She will follow up prn.  Routing to provider for final review. Patient agreeable to disposition. Will close encounter.   08 Recall in place.

## 2015-04-28 ENCOUNTER — Ambulatory Visit (HOSPITAL_COMMUNITY)
Admission: RE | Admit: 2015-04-28 | Discharge: 2015-04-28 | Disposition: A | Discharge status: Home / Self Care | Payer: BLUE CROSS/BLUE SHIELD | Source: Ambulatory Visit | Attending: Internal Medicine | Admitting: Internal Medicine

## 2015-04-28 DIAGNOSIS — C50411 Malignant neoplasm of upper-outer quadrant of right female breast: Secondary | ICD-10-CM | POA: Diagnosis present

## 2015-04-28 NOTE — Progress Notes (Signed)
Echocardiogram 2D Echocardiogram has been performed.  Jennette Dubin 04/28/2015, 12:00 PM

## 2015-04-29 ENCOUNTER — Other Ambulatory Visit: Payer: Self-pay | Admitting: *Deleted

## 2015-04-29 DIAGNOSIS — C50411 Malignant neoplasm of upper-outer quadrant of right female breast: Secondary | ICD-10-CM

## 2015-04-30 ENCOUNTER — Ambulatory Visit: Payer: BLUE CROSS/BLUE SHIELD | Admitting: Nurse Practitioner

## 2015-04-30 ENCOUNTER — Ambulatory Visit (HOSPITAL_BASED_OUTPATIENT_CLINIC_OR_DEPARTMENT_OTHER): Payer: BLUE CROSS/BLUE SHIELD

## 2015-04-30 ENCOUNTER — Other Ambulatory Visit: Payer: Self-pay | Admitting: Oncology

## 2015-04-30 ENCOUNTER — Other Ambulatory Visit (HOSPITAL_BASED_OUTPATIENT_CLINIC_OR_DEPARTMENT_OTHER): Payer: BLUE CROSS/BLUE SHIELD

## 2015-04-30 ENCOUNTER — Encounter: Payer: Self-pay | Admitting: *Deleted

## 2015-04-30 ENCOUNTER — Other Ambulatory Visit: Payer: BLUE CROSS/BLUE SHIELD

## 2015-04-30 VITALS — BP 119/67 | HR 91 | Temp 98.9°F | Resp 18

## 2015-04-30 DIAGNOSIS — C50919 Malignant neoplasm of unspecified site of unspecified female breast: Secondary | ICD-10-CM

## 2015-04-30 DIAGNOSIS — Z5112 Encounter for antineoplastic immunotherapy: Secondary | ICD-10-CM | POA: Diagnosis not present

## 2015-04-30 DIAGNOSIS — C50411 Malignant neoplasm of upper-outer quadrant of right female breast: Secondary | ICD-10-CM | POA: Diagnosis not present

## 2015-04-30 DIAGNOSIS — Z5111 Encounter for antineoplastic chemotherapy: Secondary | ICD-10-CM | POA: Diagnosis not present

## 2015-04-30 DIAGNOSIS — D649 Anemia, unspecified: Secondary | ICD-10-CM

## 2015-04-30 LAB — COMPREHENSIVE METABOLIC PANEL (CC13)
ALT: 19 U/L (ref 0–55)
ANION GAP: 8 meq/L (ref 3–11)
AST: 20 U/L (ref 5–34)
Albumin: 3.9 g/dL (ref 3.5–5.0)
Alkaline Phosphatase: 101 U/L (ref 40–150)
BUN: 10.3 mg/dL (ref 7.0–26.0)
CALCIUM: 9.1 mg/dL (ref 8.4–10.4)
CHLORIDE: 104 meq/L (ref 98–109)
CO2: 27 meq/L (ref 22–29)
CREATININE: 0.7 mg/dL (ref 0.6–1.1)
Glucose: 84 mg/dl (ref 70–140)
Potassium: 4 mEq/L (ref 3.5–5.1)
Sodium: 140 mEq/L (ref 136–145)
Total Bilirubin: 0.42 mg/dL (ref 0.20–1.20)
Total Protein: 6.8 g/dL (ref 6.4–8.3)

## 2015-04-30 LAB — CBC WITH DIFFERENTIAL/PLATELET
BASO%: 0.5 % (ref 0.0–2.0)
BASOS ABS: 0.1 10*3/uL (ref 0.0–0.1)
EOS ABS: 0 10*3/uL (ref 0.0–0.5)
EOS%: 0.1 % (ref 0.0–7.0)
HEMATOCRIT: 30 % — AB (ref 34.8–46.6)
HGB: 10.1 g/dL — ABNORMAL LOW (ref 11.6–15.9)
LYMPH#: 0.9 10*3/uL (ref 0.9–3.3)
LYMPH%: 8 % — AB (ref 14.0–49.7)
MCH: 32.9 pg (ref 25.1–34.0)
MCHC: 33.6 g/dL (ref 31.5–36.0)
MCV: 97.8 fL (ref 79.5–101.0)
MONO#: 1.2 10*3/uL — AB (ref 0.1–0.9)
MONO%: 10.7 % (ref 0.0–14.0)
NEUT#: 9.2 10*3/uL — ABNORMAL HIGH (ref 1.5–6.5)
NEUT%: 80.7 % — AB (ref 38.4–76.8)
PLATELETS: 233 10*3/uL (ref 145–400)
RBC: 3.07 10*6/uL — AB (ref 3.70–5.45)
RDW: 18.9 % — ABNORMAL HIGH (ref 11.2–14.5)
WBC: 11.5 10*3/uL — ABNORMAL HIGH (ref 3.9–10.3)

## 2015-04-30 MED ORDER — PACLITAXEL CHEMO INJECTION 300 MG/50ML
80.0000 mg/m2 | Freq: Once | INTRAVENOUS | Status: AC
  Administered 2015-04-30: 144 mg via INTRAVENOUS
  Filled 2015-04-30: qty 24

## 2015-04-30 MED ORDER — SODIUM CHLORIDE 0.9 % IJ SOLN
10.0000 mL | INTRAMUSCULAR | Status: DC | PRN
  Administered 2015-04-30: 10 mL
  Filled 2015-04-30: qty 10

## 2015-04-30 MED ORDER — DIPHENHYDRAMINE HCL 25 MG PO CAPS
ORAL_CAPSULE | ORAL | Status: AC
  Filled 2015-04-30: qty 2

## 2015-04-30 MED ORDER — ACETAMINOPHEN 325 MG PO TABS
ORAL_TABLET | ORAL | Status: AC
  Filled 2015-04-30: qty 2

## 2015-04-30 MED ORDER — FAMOTIDINE IN NACL 20-0.9 MG/50ML-% IV SOLN
20.0000 mg | Freq: Once | INTRAVENOUS | Status: AC
  Administered 2015-04-30: 20 mg via INTRAVENOUS

## 2015-04-30 MED ORDER — DIPHENHYDRAMINE HCL 50 MG/ML IJ SOLN
INTRAMUSCULAR | Status: AC
  Filled 2015-04-30: qty 1

## 2015-04-30 MED ORDER — DIPHENHYDRAMINE HCL 50 MG/ML IJ SOLN
25.0000 mg | Freq: Once | INTRAMUSCULAR | Status: AC
  Administered 2015-04-30: 25 mg via INTRAVENOUS

## 2015-04-30 MED ORDER — SODIUM CHLORIDE 0.9 % IV SOLN
Freq: Once | INTRAVENOUS | Status: AC
  Administered 2015-04-30: 11:00:00 via INTRAVENOUS

## 2015-04-30 MED ORDER — HEPARIN SOD (PORK) LOCK FLUSH 100 UNIT/ML IV SOLN
500.0000 [IU] | Freq: Once | INTRAVENOUS | Status: AC | PRN
  Administered 2015-04-30: 500 [IU]
  Filled 2015-04-30: qty 5

## 2015-04-30 MED ORDER — SODIUM CHLORIDE 0.9 % IV SOLN
840.0000 mg | Freq: Once | INTRAVENOUS | Status: AC
  Administered 2015-04-30: 840 mg via INTRAVENOUS
  Filled 2015-04-30: qty 28

## 2015-04-30 MED ORDER — TRASTUZUMAB CHEMO INJECTION 440 MG
8.0000 mg/kg | Freq: Once | INTRAVENOUS | Status: AC
  Administered 2015-04-30: 567 mg via INTRAVENOUS
  Filled 2015-04-30: qty 27

## 2015-04-30 MED ORDER — ACETAMINOPHEN 325 MG PO TABS
650.0000 mg | ORAL_TABLET | Freq: Once | ORAL | Status: AC
  Administered 2015-04-30: 650 mg via ORAL

## 2015-04-30 MED ORDER — DIPHENHYDRAMINE HCL 25 MG PO CAPS
25.0000 mg | ORAL_CAPSULE | Freq: Once | ORAL | Status: AC
  Administered 2015-04-30: 25 mg via ORAL

## 2015-04-30 MED ORDER — SODIUM CHLORIDE 0.9 % IV SOLN
Freq: Once | INTRAVENOUS | Status: AC
  Administered 2015-04-30: 16:00:00 via INTRAVENOUS
  Filled 2015-04-30: qty 4

## 2015-04-30 MED ORDER — FAMOTIDINE IN NACL 20-0.9 MG/50ML-% IV SOLN
INTRAVENOUS | Status: AC
Start: 2015-04-30 — End: 2015-04-30
  Filled 2015-04-30: qty 50

## 2015-04-30 NOTE — Progress Notes (Signed)
Pt reports nausea and worsened diarrhea during 1 hour post observation after Perjeta, Selena Lesser NP aware and states to have pt start taking 2 imodium every six hours as needed. Pt informed and told to call clinic if symptoms worsen after leaving here today and if imodium does not help. Pt verbalizes understanding.

## 2015-04-30 NOTE — Patient Instructions (Signed)
[facility] Discharge Instructions for Patients Receiving Chemotherapy  Today you received the following chemotherapy agents: Taxol, Herceptin and Perjeta   To help prevent nausea and vomiting after your treatment, we encourage you to take your nausea medication as directed: Reglan 73m every six hours as needed.    If you develop nausea and vomiting that is not controlled by your nausea medication, call the clinic.   BELOW ARE SYMPTOMS THAT SHOULD BE REPORTED IMMEDIATELY:  *FEVER GREATER THAN 100.5 F  *CHILLS WITH OR WITHOUT FEVER  NAUSEA AND VOMITING THAT IS NOT CONTROLLED WITH YOUR NAUSEA MEDICATION  *UNUSUAL SHORTNESS OF BREATH  *UNUSUAL BRUISING OR BLEEDING  TENDERNESS IN MOUTH AND THROAT WITH OR WITHOUT PRESENCE OF ULCERS  *URINARY PROBLEMS  *BOWEL PROBLEMS  UNUSUAL RASH Items with * indicate a potential emergency and should be followed up as soon as possible.  Feel free to call the clinic you have any questions or concerns. The clinic phone number is [phone].  Please show the [facility] check-in to the Emergency Department and triage nurse.   Paclitaxel injection What is this medicine? PACLITAXEL (PAK li TAX el) is a chemotherapy drug. It targets fast dividing cells, like cancer cells, and causes these cells to die. This medicine is used to treat ovarian cancer, breast cancer, and other cancers. This medicine may be used for other purposes; ask your health care provider or pharmacist if you have questions. COMMON BRAND NAME(S): Onxol, Taxol What should I tell my health care provider before I take this medicine? They need to know if you have any of these conditions: -blood disorders -irregular heartbeat -infection (especially a virus infection such as chickenpox, cold sores, or herpes) -liver disease -previous or ongoing radiation therapy -an unusual or allergic reaction to paclitaxel, alcohol, polyoxyethylated castor oil, other  chemotherapy agents, other medicines, foods, dyes, or preservatives -pregnant or trying to get pregnant -breast-feeding How should I use this medicine? This drug is given as an infusion into a vein. It is administered in a hospital or clinic by a specially trained health care professional. Talk to your pediatrician regarding the use of this medicine in children. Special care may be needed. Overdosage: If you think you have taken too much of this medicine contact a poison control center or emergency room at once. NOTE: This medicine is only for you. Do not share this medicine with others. What if I miss a dose? It is important not to miss your dose. Call your doctor or health care professional if you are unable to keep an appointment. What may interact with this medicine? Do not take this medicine with any of the following medications: -disulfiram -metronidazole This medicine may also interact with the following medications: -cyclosporine -diazepam -ketoconazole -medicines to increase blood counts like filgrastim, pegfilgrastim, sargramostim -other chemotherapy drugs like cisplatin, doxorubicin, epirubicin, etoposide, teniposide, vincristine -quinidine -testosterone -vaccines -verapamil Talk to your doctor or health care professional before taking any of these medicines: -acetaminophen -aspirin -ibuprofen -ketoprofen -naproxen This list may not describe all possible interactions. Give your health care provider a list of all the medicines, herbs, non-prescription drugs, or dietary supplements you use. Also tell them if you smoke, drink alcohol, or use illegal drugs. Some items may interact with your medicine. What should I watch for while using this medicine? Your condition will be monitored carefully while you are receiving this medicine. You will need important blood work done while you are taking this medicine. This drug may make  you feel generally unwell. This is not uncommon, as  chemotherapy can affect healthy cells as well as cancer cells. Report any side effects. Continue your course of treatment even though you feel ill unless your doctor tells you to stop. In some cases, you may be given additional medicines to help with side effects. Follow all directions for their use. Call your doctor or health care professional for advice if you get a fever, chills or sore throat, or other symptoms of a cold or flu. Do not treat yourself. This drug decreases your body's ability to fight infections. Try to avoid being around people who are sick. This medicine may increase your risk to bruise or bleed. Call your doctor or health care professional if you notice any unusual bleeding. Be careful brushing and flossing your teeth or using a toothpick because you may get an infection or bleed more easily. If you have any dental work done, tell your dentist you are receiving this medicine. Avoid taking products that contain aspirin, acetaminophen, ibuprofen, naproxen, or ketoprofen unless instructed by your doctor. These medicines may hide a fever. Do not become pregnant while taking this medicine. Women should inform their doctor if they wish to become pregnant or think they might be pregnant. There is a potential for serious side effects to an unborn child. Talk to your health care professional or pharmacist for more information. Do not breast-feed an infant while taking this medicine. Men are advised not to father a child while receiving this medicine. What side effects may I notice from receiving this medicine? Side effects that you should report to your doctor or health care professional as soon as possible: -allergic reactions like skin rash, itching or hives, swelling of the face, lips, or tongue -low blood counts - This drug may decrease the number of white blood cells, red blood cells and platelets. You may be at increased risk for infections and bleeding. -signs of infection - fever or  chills, cough, sore throat, pain or difficulty passing urine -signs of decreased platelets or bleeding - bruising, pinpoint red spots on the skin, black, tarry stools, nosebleeds -signs of decreased red blood cells - unusually weak or tired, fainting spells, lightheadedness -breathing problems -chest pain -high or low blood pressure -mouth sores -nausea and vomiting -pain, swelling, redness or irritation at the injection site -pain, tingling, numbness in the hands or feet -slow or irregular heartbeat -swelling of the ankle, feet, hands Side effects that usually do not require medical attention (report to your doctor or health care professional if they continue or are bothersome): -bone pain -complete hair loss including hair on your head, underarms, pubic hair, eyebrows, and eyelashes -changes in the color of fingernails -diarrhea -loosening of the fingernails -loss of appetite -muscle or joint pain -red flush to skin -sweating This list may not describe all possible side effects. Call your doctor for medical advice about side effects. You may report side effects to FDA at 1-800-FDA-1088. Where should I keep my medicine? This drug is given in a hospital or clinic and will not be stored at home. NOTE: This sheet is a summary. It may not cover all possible information. If you have questions about this medicine, talk to your doctor, pharmacist, or health care provider.  2015, [employer] Standard. (2012-10-01 16:41:21)   Trastuzumab injection for infusion What is this medicine? TRASTUZUMAB (tras TOO zoo mab) is a monoclonal antibody. It targets a protein called HER2. This protein is found in some stomach and breast cancers.  This medicine can stop cancer cell growth. This medicine may be used with other cancer treatments. This medicine may be used for other purposes; ask your health care provider or pharmacist if you have questions. COMMON BRAND NAME(S): Herceptin What should I tell  my health care provider before I take this medicine? They need to know if you have any of these conditions: -heart disease -heart failure -infection (especially a virus infection such as chickenpox, cold sores, or herpes) -lung or breathing disease, like asthma -recent or ongoing radiation therapy -an unusual or allergic reaction to trastuzumab, benzyl alcohol, or other medications, foods, dyes, or preservatives -pregnant or trying to get pregnant -breast-feeding How should I use this medicine? This drug is given as an infusion into a vein. It is administered in a hospital or clinic by a specially trained health care professional. Talk to your pediatrician regarding the use of this medicine in children. This medicine is not approved for use in children. Overdosage: If you think you have taken too much of this medicine contact a poison control center or emergency room at once. NOTE: This medicine is only for you. Do not share this medicine with others. What if I miss a dose? It is important not to miss a dose. Call your doctor or health care professional if you are unable to keep an appointment. What may interact with this medicine? -cyclophosphamide -doxorubicin -warfarin This list may not describe all possible interactions. Give your health care provider a list of all the medicines, herbs, non-prescription drugs, or dietary supplements you use. Also tell them if you smoke, drink alcohol, or use illegal drugs. Some items may interact with your medicine. What should I watch for while using this medicine? Visit your doctor for checks on your progress. Report any side effects. Continue your course of treatment even though you feel ill unless your doctor tells you to stop. Call your doctor or health care professional for advice if you get a fever, chills or sore throat, or other symptoms of a cold or flu. Do not treat yourself. Try to avoid being around people who are sick. You may experience  fever, chills and shaking during your first infusion. These effects are usually mild and can be treated with other medicines. Report any side effects during the infusion to your health care professional. Fever and chills usually do not happen with later infusions. What side effects may I notice from receiving this medicine? Side effects that you should report to your doctor or other health care professional as soon as possible: -breathing difficulties -chest pain or palpitations -cough -dizziness or fainting -fever or chills, sore throat -skin rash, itching or hives -swelling of the legs or ankles -unusually weak or tired Side effects that usually do not require medical attention (report to your doctor or other health care professional if they continue or are bothersome): -loss of appetite -headache -muscle aches -nausea This list may not describe all possible side effects. Call your doctor for medical advice about side effects. You may report side effects to FDA at 1-800-FDA-1088. Where should I keep my medicine? This drug is given in a hospital or clinic and will not be stored at home. NOTE: This sheet is a summary. It may not cover all possible information. If you have questions about this medicine, talk to your doctor, pharmacist, or health care provider.  2015, [employer]/Gold Standard. (2009-06-12 13:43:15)   Pertuzumab injection What is this medicine? PERTUZUMAB (per TOOZ ue mab) is a monoclonal antibody that  targets a protein called HER2. HER2 is found in some breast cancers. This medicine can stop cancer cell growth. This medicine is used with other cancer treatments. This medicine may be used for other purposes; ask your health care provider or pharmacist if you have questions. COMMON BRAND NAME(S): PERJETA What should I tell my health care provider before I take this medicine? They need to know if you have any of these conditions: -heart disease -heart failure -high blood  pressure -history of irregular heart beat -recent or ongoing radiation therapy -an unusual or allergic reaction to pertuzumab, other medicines, foods, dyes, or preservatives -pregnant or trying to get pregnant -breast-feeding How should I use this medicine? This medicine is for infusion into a vein. It is given by a health care professional in a hospital or clinic setting. Talk to your pediatrician regarding the use of this medicine in children. Special care may be needed. Overdosage: If you think you've taken too much of this medicine contact a poison control center or emergency room at once. Overdosage: If you think you have taken too much of this medicine contact a poison control center or emergency room at once. NOTE: This medicine is only for you. Do not share this medicine with others. What if I miss a dose? It is important not to miss your dose. Call your doctor or health care professional if you are unable to keep an appointment. What may interact with this medicine? Interactions are not expected. Give your health care provider a list of all the medicines, herbs, non-prescription drugs, or dietary supplements you use. Also tell them if you smoke, drink alcohol, or use illegal drugs. Some items may interact with your medicine. This list may not describe all possible interactions. Give your health care provider a list of all the medicines, herbs, non-prescription drugs, or dietary supplements you use. Also tell them if you smoke, drink alcohol, or use illegal drugs. Some items may interact with your medicine. What should I watch for while using this medicine? Your condition will be monitored carefully while you are receiving this medicine. Report any side effects. Continue your course of treatment even though you feel ill unless your doctor tells you to stop. Do not become pregnant while taking this medicine. Women should inform their doctor if they wish to become pregnant or think they  might be pregnant. There is a potential for serious side effects to an unborn child. Talk to your health care professional or pharmacist for more information. Do not breast-feed an infant while taking this medicine. Call your doctor or health care professional for advice if you get a fever, chills or sore throat, or other symptoms of a cold or flu. Do not treat yourself. Try to avoid being around people who are sick. You may experience fever, chills, and headache during the infusion. Report any side effects during the infusion to your health care professional. What side effects may I notice from receiving this medicine? Side effects that you should report to your doctor or health care professional as soon as possible: -breathing problems -chest pain or palpitations -dizziness -feeling faint or lightheaded -fever or chills -skin rash, itching or hives -sore throat -swelling of the face, lips, or tongue -swelling of the legs or ankles -unusually weak or tired Side effects that usually do not require medical attention (Report these to your doctor or health care professional if they continue or are bothersome.): -diarrhea -hair loss -nausea, vomiting -tiredness This list may not describe all possible  side effects. Call your doctor for medical advice about side effects. You may report side effects to FDA at 1-800-FDA-1088. Where should I keep my medicine? This drug is given in a hospital or clinic and will not be stored at home. NOTE: This sheet is a summary. It may not cover all possible information. If you have questions about this medicine, talk to your doctor, pharmacist, or health care provider.  2015, [employer] Standard. (2012-06-06 16:54:15)

## 2015-05-01 ENCOUNTER — Telehealth: Payer: Self-pay | Admitting: Nurse Practitioner

## 2015-05-01 NOTE — Telephone Encounter (Signed)
Returned [employer] account executive. Left message confirming appointment 09/29 canceled due to patient going out of town.

## 2015-05-05 ENCOUNTER — Telehealth: Payer: Self-pay | Admitting: *Deleted

## 2015-05-05 NOTE — Telephone Encounter (Signed)
WHEN PT. GOT UP THIS MORNING SHE NOTICED HER DISTANT VISION WAS BLURRY AND HER UP CLOSE VISION IS CLEAR WHICH IS THE REVERSE OF WHAT HER VISION NORMALLY IS. PT.'S CO WORKERS TOLD PT. HER PUPILS ARE CONSTRICTED. HER EYES ARE VERY DRY AND IRRITATED. SHE IS USING REWETTING DROPS WHICH HELP FOR A SHORT PERIOD OF TIME. SHOULD PT. CALL HER EYE DOCTOR?

## 2015-05-05 NOTE — Telephone Encounter (Signed)
LEFT MESSAGE ON PT.'S VOICE MAIL TO RETURN A CALL TO TRIAGE WITH MORE INFORMATION CONCERNING "VISUAL SYMPTOMS".

## 2015-05-06 ENCOUNTER — Other Ambulatory Visit: Payer: Self-pay | Admitting: *Deleted

## 2015-05-06 DIAGNOSIS — C50411 Malignant neoplasm of upper-outer quadrant of right female breast: Secondary | ICD-10-CM

## 2015-05-07 ENCOUNTER — Telehealth: Payer: Self-pay | Admitting: Nurse Practitioner

## 2015-05-07 ENCOUNTER — Other Ambulatory Visit (HOSPITAL_BASED_OUTPATIENT_CLINIC_OR_DEPARTMENT_OTHER): Payer: BLUE CROSS/BLUE SHIELD

## 2015-05-07 ENCOUNTER — Ambulatory Visit (HOSPITAL_BASED_OUTPATIENT_CLINIC_OR_DEPARTMENT_OTHER): Payer: BLUE CROSS/BLUE SHIELD | Admitting: Nurse Practitioner

## 2015-05-07 ENCOUNTER — Ambulatory Visit (HOSPITAL_BASED_OUTPATIENT_CLINIC_OR_DEPARTMENT_OTHER): Payer: BLUE CROSS/BLUE SHIELD

## 2015-05-07 VITALS — BP 129/72 | HR 88 | Temp 97.7°F | Resp 18 | Ht 66.5 in | Wt 156.3 lb

## 2015-05-07 DIAGNOSIS — C50411 Malignant neoplasm of upper-outer quadrant of right female breast: Secondary | ICD-10-CM

## 2015-05-07 DIAGNOSIS — H538 Other visual disturbances: Secondary | ICD-10-CM

## 2015-05-07 DIAGNOSIS — R21 Rash and other nonspecific skin eruption: Secondary | ICD-10-CM

## 2015-05-07 DIAGNOSIS — Z170 Estrogen receptor positive status [ER+]: Secondary | ICD-10-CM | POA: Diagnosis not present

## 2015-05-07 DIAGNOSIS — R5383 Other fatigue: Secondary | ICD-10-CM

## 2015-05-07 DIAGNOSIS — Z5111 Encounter for antineoplastic chemotherapy: Secondary | ICD-10-CM

## 2015-05-07 LAB — CBC WITH DIFFERENTIAL/PLATELET
BASO%: 0.4 % (ref 0.0–2.0)
Basophils Absolute: 0 10*3/uL (ref 0.0–0.1)
EOS ABS: 0 10*3/uL (ref 0.0–0.5)
EOS%: 0.2 % (ref 0.0–7.0)
HCT: 27.5 % — ABNORMAL LOW (ref 34.8–46.6)
HGB: 9.1 g/dL — ABNORMAL LOW (ref 11.6–15.9)
LYMPH%: 18 % (ref 14.0–49.7)
MCH: 32.5 pg (ref 25.1–34.0)
MCHC: 33.1 g/dL (ref 31.5–36.0)
MCV: 98.2 fL (ref 79.5–101.0)
MONO#: 0.5 10*3/uL (ref 0.1–0.9)
MONO%: 11.6 % (ref 0.0–14.0)
NEUT%: 69.8 % (ref 38.4–76.8)
NEUTROS ABS: 3.2 10*3/uL (ref 1.5–6.5)
Platelets: 375 10*3/uL (ref 145–400)
RBC: 2.8 10*6/uL — AB (ref 3.70–5.45)
RDW: 18.4 % — ABNORMAL HIGH (ref 11.2–14.5)
WBC: 4.6 10*3/uL (ref 3.9–10.3)
lymph#: 0.8 10*3/uL — ABNORMAL LOW (ref 0.9–3.3)

## 2015-05-07 MED ORDER — FAMOTIDINE IN NACL 20-0.9 MG/50ML-% IV SOLN
20.0000 mg | Freq: Once | INTRAVENOUS | Status: AC
  Administered 2015-05-07: 20 mg via INTRAVENOUS

## 2015-05-07 MED ORDER — SODIUM CHLORIDE 0.9 % IV SOLN
Freq: Once | INTRAVENOUS | Status: AC
  Administered 2015-05-07: 12:00:00 via INTRAVENOUS
  Filled 2015-05-07: qty 4

## 2015-05-07 MED ORDER — SODIUM CHLORIDE 0.9 % IJ SOLN
10.0000 mL | INTRAMUSCULAR | Status: DC | PRN
  Administered 2015-05-07: 10 mL
  Filled 2015-05-07: qty 10

## 2015-05-07 MED ORDER — FAMOTIDINE IN NACL 20-0.9 MG/50ML-% IV SOLN
INTRAVENOUS | Status: AC
Start: 2015-05-07 — End: 2015-05-07
  Filled 2015-05-07: qty 50

## 2015-05-07 MED ORDER — DIPHENHYDRAMINE HCL 50 MG/ML IJ SOLN
25.0000 mg | Freq: Once | INTRAMUSCULAR | Status: AC
  Administered 2015-05-07: 25 mg via INTRAVENOUS

## 2015-05-07 MED ORDER — DIPHENHYDRAMINE HCL 50 MG/ML IJ SOLN
INTRAMUSCULAR | Status: AC
  Filled 2015-05-07: qty 1

## 2015-05-07 MED ORDER — PACLITAXEL CHEMO INJECTION 300 MG/50ML
80.0000 mg/m2 | Freq: Once | INTRAVENOUS | Status: AC
  Administered 2015-05-07: 144 mg via INTRAVENOUS
  Filled 2015-05-07: qty 24

## 2015-05-07 MED ORDER — CLINDAMYCIN PHOSPHATE 1 % EX GEL: Freq: Two times a day (BID) | CUTANEOUS | Status: DC

## 2015-05-07 MED ORDER — HEPARIN SOD (PORK) LOCK FLUSH 100 UNIT/ML IV SOLN
500.0000 [IU] | Freq: Once | INTRAVENOUS | Status: AC | PRN
  Administered 2015-05-07: 500 [IU]
  Filled 2015-05-07: qty 5

## 2015-05-07 MED ORDER — SODIUM CHLORIDE 0.9 % IV SOLN
Freq: Once | INTRAVENOUS | Status: AC
  Administered 2015-05-07: 12:00:00 via INTRAVENOUS

## 2015-05-07 NOTE — Progress Notes (Signed)
[facility]  [phone] [phone]     ID: Bonnie Ward DOB: 21-Oct-1960  MR#: [id number]  TMA#:[id number]  Patient Care Team: Lollie Sails, MD as PCP - General (Internal Medicine) Milford Cage, OTR Kem Boroughs, FNP as Nurse Practitioner (Nurse Practitioner) Fanny Skates, MD as Consulting Physician (General Surgery) Chauncey Cruel, MD as Consulting Physician (Oncology) Thea Silversmith, MD as Consulting Physician (Radiation Oncology) Rockwell Germany, RN as Registered Nurse Mauro Kaufmann, RN as Registered Nurse Holley Bouche, NP as Nurse Practitioner (Nurse Practitioner) Kristeen Miss, MD as Consulting Physician (Neurosurgery) PCP: Lollie Sails, MD OTHER MD:  CHIEF COMPLAINT: Estrogen receptor positive breast cancer  CURRENT TREATMENT: adjuvant chemotherapy   BREAST CANCER HISTORY: From the initial intake note:   Bonnie Ward had routine bilateral screening mammography at the [facility] for 20 01/09/2015 showing a possible mass in the right breast. On 12/22/2014 she underwent diagnostic right mammography with tomosynthesis and right breast ultrasonography. The breast density was category C. In the right breast there was an area of increased density with architectural distortion and faint microcalcifications in the upper outer quadrant. There was mild palpable soft tissue thickening in the area in question, but no palpable right axillary lymph nodes. Ultrasound confirmed a 1.7 cm irregular hypoechoic mass. There were no abnormal-appearing right axillary lymph nodes.  Biopsy of the right breast mass in question 12/26/2014 showed (SAA 16-08/08/2005) an invasive ductal carcinoma, grade 1 or 2, with a prognostic panel still pending.  Her subsequent history is as detailed below  INTERVAL HISTORY: Bonnie Ward returns today for follow-up of her breast cancer. Today is day 1, cycle 2 of paclitaxel, with trastuzumab and pertuzumab given  on every 3rd week of treatment.   REVIEW OF SYSTEMS: Bonnie Ward was caught off guard with her treatments last week. She did understand that she would be given the antibodies along with the paclitaxel last week, so she was here for 8 hours and had a long day. During the infusion process she had no reactions, but she believed the pertuzumab made her nauseous during the "observation period." When the dexamethasone was given as premeds for the paclitaxel, the nausea resolved. Her nausea was minimal for the days afterwards. She no longer wants the dexamethasone however because it blurred her vision worse than ever before. She was nervous to drive. She is batting sinus symptoms such as a runny nose and eyes. She denies fevers, chills, or changes in bowel or bladder habits. Her appetite is fair. Her main complaint is excessive fatigue. She wonders if she needs blood. She still has the rash to her arms, chest, and legs.  A detailed review of systems is otherwise stable.   PAST MEDICAL HISTORY: Past Medical History  Diagnosis Date  . ACID REFLUX DISEASE 07/12/2010  . Arachnoid cyst     left frontal lobe  . Migraines   . Arthritis     hands  . Nervous stomach   . Difficulty swallowing pills   . Hypothyroidism   . Stress fracture of foot 12/2014    right  . Glaucoma   . Breast cancer 12/2014    right  . Anxiety   . Dental crowns present   . Seasonal allergies   . Complication of anesthesia 2012    states woke up during colonoscopy with Propofol    PAST SURGICAL HISTORY: Past Surgical History  Procedure Laterality Date  . Tonsillectomy  age 70  . Esophagogastroduodenoscopy endoscopy    . Tooth extraction    .  Lasik    . Colonoscopy with propofol  10/14/2013  . Radioactive seed guided mastectomy with axillary sentinel lymph node biopsy Right 01/23/2015    Procedure: RIGHT PARTIAL MASTECTOMY WITH RADIOACTIVE SEED LOCALIZATION  WITH RIGHT AXILLARY SENTINEL LYMPH NODE BIOPSY;  Surgeon: Fanny Skates,  MD;  Location: [facility];  Service: General;  Laterality: Right;  . Portacath placement Left 01/23/2015    Procedure: INSERTION PORT-A-CATH ;  Surgeon: Fanny Skates, MD;  Location: [facility];  Service: General;  Laterality: Left;  . Re-excision of breast lumpectomy Right 02/16/2015    Procedure: RE-EXCISION OF RIGHT  BREAST LUMPECTOMY MARGINS;  Surgeon: Fanny Skates, MD;  Location: [facility];  Service: General;  Laterality: Right;    FAMILY HISTORY Family History  Problem Relation Age of Onset  . Osteoporosis Mother   . Irritable bowel syndrome Mother   . Hypertension Mother   . Hypertension Father   . Hyperlipidemia Father   . Diabetes Father   . Dementia Father   . Colon cancer Maternal Grandfather 48  . Breast cancer Paternal Aunt   . Breast cancer Paternal Grandmother    the patient's father died at age 10 from pneumonia. [name] died at age 47 after bowel perforation. The patient had one brother, one sister. There is no history of breast or ovarian cancer in the family.  GYNECOLOGIC HISTORY:  Patient's last menstrual period was 01/25/2011. Menarche age 66, the patient is GX P0. She stopped having periods approximately 2011. She is still on hormone replacement, but is "trickling off it". She did take birth control pills for approximately 3 years remotely with no complications  SOCIAL HISTORY:  Bonnie Ward works as a Haematologist. She is divorced and lives by herself, with 2 dogs.    ADVANCED DIRECTIVES: The patient's brother [name] is her healthcare power of attorney. He may be reached in [place] at [place]: Social History  Substance Use Topics  . Smoking status: Former Smoker -- 0.00 packs/day for 0 years    Quit date: 02/19/2005  . Smokeless tobacco: Never Used  . Alcohol Use: Yes     Comment: occasionally     Colonoscopy: February 2016  PAP: June 2015  Bone density: October  2015  Lipid panel:  Allergies  Allergen Reactions  . Lortab [Hydrocodone-Acetaminophen] Anxiety  . Milk-Related Compounds Diarrhea    GI UPSET    Current Outpatient Prescriptions  Medication Sig Dispense Refill  . acetaminophen (TYLENOL) 325 MG tablet Take 650 mg by mouth every 6 (six) hours as needed.    . ALPRAZolam (XANAX) 0.5 MG tablet Take 1 tablet by mouth as needed.  4  . bimatoprost (LUMIGAN) 0.03 % ophthalmic solution Place 1 drop into both eyes at bedtime.    . clindamycin (CLINDAGEL) 1 % gel Apply topically 2 (two) times daily. 30 g 0  . lidocaine-prilocaine (EMLA) cream APP EXT AA 1 TIME  3  . liothyronine (CYTOMEL) 5 MCG tablet Take 5 mcg by mouth 2 (two) times daily.    Marland Kitchen LORazepam (ATIVAN) 0.5 MG tablet TK 1 T PO  HS  0  . LUMIGAN 0.01 % SOLN Place 1 drop into both eyes at bedtime.  0  . metoCLOPramide (REGLAN) 5 MG tablet Take 1 tablet (5 mg total) by mouth every 6 (six) hours as needed for nausea. 60 tablet 2  . omeprazole (PRILOSEC) 40 MG capsule Take 1 capsule (40 mg total) by mouth daily. [place]  capsule 3  . ondansetron (ZOFRAN) 8 MG tablet   1  . sucralfate (CARAFATE) 1 GM/10ML suspension Take 10 mLs (1 g total) by mouth 4 (four) times daily -  with meals and at bedtime. (Patient not taking: Reported on 04/23/2015) 420 mL 0  . SUMAtriptan (IMITREX) 50 MG tablet TAKE 1 TABLET BY MOUTH AT THE ONSET OF HEADACHE. TAKE NO MORE THAN 4 TABLETS BY MOUTH IN 24 HOURS (Patient not taking: Reported on 04/23/2015) 9 tablet 5  . triamcinolone cream (KENALOG) 0.1 % Apply 1 application topically 2 (two) times daily. 45 g 0  . valACYclovir (VALTREX) 500 MG tablet Take 1 tablet (500 mg total) by mouth daily. 30 tablet 3   No current facility-administered medications for this visit.   Facility-Administered Medications Ordered in Other Visits  Medication Dose Route Frequency Provider Last Rate Last Dose  . sodium chloride 0.9 % injection 10 mL  10 mL Intracatheter PRN Chauncey Cruel, MD    10 mL at 04/16/15 1209  . sodium chloride 0.9 % injection 10 mL  10 mL Intracatheter PRN Chauncey Cruel, MD   10 mL at 05/07/15 1339    OBJECTIVE: Middle-aged white woman in no acute distress Filed Vitals:   05/07/15 1034  BP: 129/72  Pulse: 88  Temp: 97.7 F (36.5 C)  Resp: 18     Body mass index is 24.85 kg/(m^2).    ECOG FS:1 - Symptomatic but completely ambulatory  Sclerae unicteric, pupils round and equal Oropharynx clear and moist-- no thrush or other lesions No cervical or supraclavicular adenopathy Lungs no rales or rhonchi Heart regular rate and rhythm Abd soft, nontender, positive bowel sounds MSK no focal spinal tenderness, no upper extremity lymphedema Neuro: nonfocal, well oriented, appropriate affect Breasts: deferred  LAB RESULTS:  CMP     Component Value Date/Time   NA 140 04/30/2015 0915   NA 140 01/21/2009 0828   K 4.0 04/30/2015 0915   K 4.1 01/21/2009 0828   CL 108 01/21/2009 0828   CO2 27 04/30/2015 0915   CO2 30 01/21/2009 0828   GLUCOSE 84 04/30/2015 0915   GLUCOSE 90 01/21/2009 0828   BUN 10.3 04/30/2015 0915   BUN 13 01/21/2009 0828   CREATININE 0.7 04/30/2015 0915   CREATININE 0.7 01/21/2009 0828   CALCIUM 9.1 04/30/2015 0915   CALCIUM 8.5 01/21/2009 0828   PROT 6.8 04/30/2015 0915   PROT 6.9 01/21/2009 0828   ALBUMIN 3.9 04/30/2015 0915   ALBUMIN 3.7 01/21/2009 0828   AST 20 04/30/2015 0915   AST 22 01/21/2009 0828   ALT 19 04/30/2015 0915   ALT 19 01/21/2009 0828   ALKPHOS 101 04/30/2015 0915   ALKPHOS 43 01/21/2009 0828   BILITOT 0.42 04/30/2015 0915   BILITOT 1.1 01/21/2009 0828   GFRNONAA 95.01 01/21/2009 0828    INo results found for: SPEP, UPEP  Lab Results  Component Value Date   WBC 4.6 05/07/2015   NEUTROABS 3.2 05/07/2015   HGB 9.1* 05/07/2015   HCT 27.5* 05/07/2015   MCV 98.2 05/07/2015   PLT 375 05/07/2015      Chemistry      Component Value Date/Time   NA 140 04/30/2015 0915   NA 140 01/21/2009 0828    K 4.0 04/30/2015 0915   K 4.1 01/21/2009 0828   CL 108 01/21/2009 0828   CO2 27 04/30/2015 0915   CO2 30 01/21/2009 0828   BUN 10.3 04/30/2015 0915   BUN 13 01/21/2009 0828  CREATININE 0.7 04/30/2015 0915   CREATININE 0.7 01/21/2009 0828      Component Value Date/Time   CALCIUM 9.1 04/30/2015 0915   CALCIUM 8.5 01/21/2009 0828   ALKPHOS 101 04/30/2015 0915   ALKPHOS 43 01/21/2009 0828   AST 20 04/30/2015 0915   AST 22 01/21/2009 0828   ALT 19 04/30/2015 0915   ALT 19 01/21/2009 0828   BILITOT 0.42 04/30/2015 0915   BILITOT 1.1 01/21/2009 0828       No results found for: LABCA2  No components found for: LABCA125  No results for input(s): INR in the last 168 hours.  Urinalysis    Component Value Date/Time   COLORURINE yellow 10/05/2009 1454   APPEARANCEUR Clear 10/05/2009 1454   LABSPEC 1.010 10/05/2009 1454   PHURINE 7.0 10/05/2009 1454   HGBUR 2+ 10/05/2009 1454   BILIRUBINUR neg 04/13/2015 1605   BILIRUBINUR negative 10/05/2009 1454   PROTEINUR neg 04/13/2015 1605   UROBILINOGEN negative 04/13/2015 1605   UROBILINOGEN 0.2 10/05/2009 1454   NITRITE neg 04/13/2015 1605   NITRITE negative 10/05/2009 1454   LEUKOCYTESUR Negative 04/13/2015 1605    STUDIES: No results found.  ASSESSMENT: 54 y.o. [place] woman status post right breast biopsy 12/26/2014 for a clinical T1 cN0, stage IA invasive ductal carcinoma, grade 1 or 2, estrogen and progesterone receptor positive, with an MIB-1 of 11%, and HER-2 amplified, with a signals ratio of 2.07, number per cell 2.28.  (1) status post right lumpectomy and sentinel lymph node sampling 01/23/2015 for a pT1c pN1, stage IIA invasive ductal carcinoma, grade 1, with focally positive anterior margins  (a) additional surgery for margin clearance 02/16/2015 followed residual disease  (2) chemotherapy starting 03/05/2015, to consist of doxorubicin and cyclophosphamide in dose dense fashion 4, to be followed by paclitaxel  weekly,  with trastuzumab and pertuzumab given every 3 weeks   (3) trastuzumab will be continued to complete 1 year  (a) echocardiogram 04/28/2015 shows a normal ejection fraction  (4) adjuvant radiation to follow chemotherapy  (5) anti-estrogens to follow radiation  PLAN: Bonnie Ward is hesitant about this next cycle of chemotherapy given her experience with the first. The labs were reviewed in detail and her hgb is down to 9.1 and this could be the cause of some of her fatigue, of course she is not low enough to require a blood transfusion. She was adamant about having the dexamethasone removed from her treatment plan, as she experienced extreme vision changes with the last dose given as a premed. I feel comfortable with this as she had no reaction with her first cycle of paclitaxel and had minimal nausea afterwards.   She will continue OTC remedies such as eye drops, antihistamines, and nasal sprays for her allergy symptoms. I have sent in a prescription for clindamycin gel to apply to her broken out areas. She was uninterested in oral doxycycline.   Bonnie Ward will return in 1 week for next cycle of treatment. She understands and agrees with this plan. She knows the goal of treatment in he case is cure. She has been encouraged to call with any issues that might arise before her next visit here.   Laurie Panda, NP   05/07/2015 1:55 PM

## 2015-05-07 NOTE — Patient Instructions (Signed)
[facility] Discharge Instructions for Patients Receiving Chemotherapy  Today you received the following chemotherapy agents:  Taxol  To help prevent nausea and vomiting after your treatment, we encourage you to take your nausea medication as prescribed.   If you develop nausea and vomiting that is not controlled by your nausea medication, call the clinic.   BELOW ARE SYMPTOMS THAT SHOULD BE REPORTED IMMEDIATELY:  *FEVER GREATER THAN 100.5 F  *CHILLS WITH OR WITHOUT FEVER  NAUSEA AND VOMITING THAT IS NOT CONTROLLED WITH YOUR NAUSEA MEDICATION  *UNUSUAL SHORTNESS OF BREATH  *UNUSUAL BRUISING OR BLEEDING  TENDERNESS IN MOUTH AND THROAT WITH OR WITHOUT PRESENCE OF ULCERS  *URINARY PROBLEMS  *BOWEL PROBLEMS  UNUSUAL RASH Items with * indicate a potential emergency and should be followed up as soon as possible.  Feel free to call the clinic you have any questions or concerns. The clinic phone number is [phone].  Please show the CHEMO ALERT CARD at check-in to the Emergency Department and triage nurse.   

## 2015-05-07 NOTE — Telephone Encounter (Signed)
Pt confirmed labs/ov per 09/15 POF, gave pt AVS and Calendar... KJ, added chemo to schedule

## 2015-05-08 ENCOUNTER — Encounter: Payer: Self-pay | Admitting: Nurse Practitioner

## 2015-05-08 ENCOUNTER — Encounter (HOSPITAL_COMMUNITY): Payer: BLUE CROSS/BLUE SHIELD

## 2015-05-08 ENCOUNTER — Telehealth: Payer: Self-pay | Admitting: *Deleted

## 2015-05-08 DIAGNOSIS — R5383 Other fatigue: Secondary | ICD-10-CM | POA: Insufficient documentation

## 2015-05-08 NOTE — Telephone Encounter (Signed)
Per staff message and POF I have scheduled appts. Advised scheduler of appts. JMW  

## 2015-05-14 ENCOUNTER — Encounter: Payer: Self-pay | Admitting: Nurse Practitioner

## 2015-05-14 ENCOUNTER — Other Ambulatory Visit (HOSPITAL_BASED_OUTPATIENT_CLINIC_OR_DEPARTMENT_OTHER): Payer: BLUE CROSS/BLUE SHIELD

## 2015-05-14 ENCOUNTER — Ambulatory Visit (HOSPITAL_BASED_OUTPATIENT_CLINIC_OR_DEPARTMENT_OTHER): Payer: BLUE CROSS/BLUE SHIELD

## 2015-05-14 ENCOUNTER — Ambulatory Visit (HOSPITAL_BASED_OUTPATIENT_CLINIC_OR_DEPARTMENT_OTHER): Payer: BLUE CROSS/BLUE SHIELD | Admitting: Nurse Practitioner

## 2015-05-14 ENCOUNTER — Encounter: Payer: Self-pay | Admitting: *Deleted

## 2015-05-14 VITALS — BP 135/80 | HR 88 | Temp 98.3°F | Resp 18 | Ht 66.5 in | Wt 155.7 lb

## 2015-05-14 DIAGNOSIS — Z5111 Encounter for antineoplastic chemotherapy: Secondary | ICD-10-CM

## 2015-05-14 DIAGNOSIS — C50411 Malignant neoplasm of upper-outer quadrant of right female breast: Secondary | ICD-10-CM

## 2015-05-14 LAB — CBC WITH DIFFERENTIAL/PLATELET
BASO%: 0.6 % (ref 0.0–2.0)
Basophils Absolute: 0 10*3/uL (ref 0.0–0.1)
EOS%: 0.8 % (ref 0.0–7.0)
Eosinophils Absolute: 0 10*3/uL (ref 0.0–0.5)
HCT: 28.3 % — ABNORMAL LOW (ref 34.8–46.6)
HGB: 9.5 g/dL — ABNORMAL LOW (ref 11.6–15.9)
LYMPH%: 20.2 % (ref 14.0–49.7)
MCH: 33.2 pg (ref 25.1–34.0)
MCHC: 33.5 g/dL (ref 31.5–36.0)
MCV: 98.9 fL (ref 79.5–101.0)
MONO#: 0.5 10*3/uL (ref 0.1–0.9)
MONO%: 14.3 % — AB (ref 0.0–14.0)
NEUT#: 2.1 10*3/uL (ref 1.5–6.5)
NEUT%: 64.1 % (ref 38.4–76.8)
Platelets: 305 10*3/uL (ref 145–400)
RBC: 2.87 10*6/uL — AB (ref 3.70–5.45)
RDW: 20.1 % — ABNORMAL HIGH (ref 11.2–14.5)
WBC: 3.3 10*3/uL — ABNORMAL LOW (ref 3.9–10.3)
lymph#: 0.7 10*3/uL — ABNORMAL LOW (ref 0.9–3.3)

## 2015-05-14 LAB — COMPREHENSIVE METABOLIC PANEL (CC13)
ALT: 46 U/L (ref 0–55)
AST: 28 U/L (ref 5–34)
Albumin: 3.7 g/dL (ref 3.5–5.0)
Alkaline Phosphatase: 65 U/L (ref 40–150)
Anion Gap: 5 mEq/L (ref 3–11)
BUN: 11.1 mg/dL (ref 7.0–26.0)
CHLORIDE: 106 meq/L (ref 98–109)
CO2: 28 meq/L (ref 22–29)
CREATININE: 0.7 mg/dL (ref 0.6–1.1)
Calcium: 9 mg/dL (ref 8.4–10.4)
EGFR: >90 mL/min/{1.73_m2} (ref >90)
Glucose: 95 mg/dl (ref 70–140)
Potassium: 4.2 mEq/L (ref 3.5–5.1)
Sodium: 139 mEq/L (ref 136–145)
Total Bilirubin: 0.44 mg/dL (ref 0.20–1.20)
Total Protein: 6.3 g/dL — ABNORMAL LOW (ref 6.4–8.3)

## 2015-05-14 MED ORDER — SODIUM CHLORIDE 0.9 % IV SOLN
Freq: Once | INTRAVENOUS | Status: DC
  Filled 2015-05-14: qty 4

## 2015-05-14 MED ORDER — PACLITAXEL CHEMO INJECTION 300 MG/50ML
80.0000 mg/m2 | Freq: Once | INTRAVENOUS | Status: AC
  Administered 2015-05-14: 144 mg via INTRAVENOUS
  Filled 2015-05-14: qty 24

## 2015-05-14 MED ORDER — SODIUM CHLORIDE 0.9 % IV SOLN
Freq: Once | INTRAVENOUS | Status: AC
  Administered 2015-05-14: 11:00:00 via INTRAVENOUS

## 2015-05-14 MED ORDER — FAMOTIDINE IN NACL 20-0.9 MG/50ML-% IV SOLN
INTRAVENOUS | Status: AC
  Filled 2015-05-14: qty 50

## 2015-05-14 MED ORDER — DIPHENHYDRAMINE HCL 50 MG/ML IJ SOLN
INTRAMUSCULAR | Status: AC
  Filled 2015-05-14: qty 1

## 2015-05-14 MED ORDER — SODIUM CHLORIDE 0.9 % IJ SOLN
10.0000 mL | INTRAMUSCULAR | Status: DC | PRN
  Administered 2015-05-14: 10 mL
  Filled 2015-05-14: qty 10

## 2015-05-14 MED ORDER — SODIUM CHLORIDE 0.9 % IV SOLN
Freq: Once | INTRAVENOUS | Status: AC
  Administered 2015-05-14: 11:00:00 via INTRAVENOUS
  Filled 2015-05-14: qty 4

## 2015-05-14 MED ORDER — FAMOTIDINE IN NACL 20-0.9 MG/50ML-% IV SOLN
20.0000 mg | Freq: Once | INTRAVENOUS | Status: AC
  Administered 2015-05-14: 20 mg via INTRAVENOUS

## 2015-05-14 MED ORDER — HEPARIN SOD (PORK) LOCK FLUSH 100 UNIT/ML IV SOLN
500.0000 [IU] | Freq: Once | INTRAVENOUS | Status: AC | PRN
  Administered 2015-05-14: 500 [IU]
  Filled 2015-05-14: qty 5

## 2015-05-14 MED ORDER — DIPHENHYDRAMINE HCL 50 MG/ML IJ SOLN
25.0000 mg | Freq: Once | INTRAMUSCULAR | Status: AC
  Administered 2015-05-14: 25 mg via INTRAVENOUS

## 2015-05-14 NOTE — Patient Instructions (Signed)
[facility] Discharge Instructions for Patients Receiving Chemotherapy  Today you received the following chemotherapy agents:  Taxol  To help prevent nausea and vomiting after your treatment, we encourage you to take your nausea medication as prescribed.   If you develop nausea and vomiting that is not controlled by your nausea medication, call the clinic.   BELOW ARE SYMPTOMS THAT SHOULD BE REPORTED IMMEDIATELY:  *FEVER GREATER THAN 100.5 F  *CHILLS WITH OR WITHOUT FEVER  NAUSEA AND VOMITING THAT IS NOT CONTROLLED WITH YOUR NAUSEA MEDICATION  *UNUSUAL SHORTNESS OF BREATH  *UNUSUAL BRUISING OR BLEEDING  TENDERNESS IN MOUTH AND THROAT WITH OR WITHOUT PRESENCE OF ULCERS  *URINARY PROBLEMS  *BOWEL PROBLEMS  UNUSUAL RASH Items with * indicate a potential emergency and should be followed up as soon as possible.  Feel free to call the clinic you have any questions or concerns. The clinic phone number is [phone].  Please show the CHEMO ALERT CARD at check-in to the Emergency Department and triage nurse.   

## 2015-05-14 NOTE — Progress Notes (Signed)
Patient states she should not have steroids as she develops a "temporary blindness". Dr. Jana Hakim notified. Verbal order given and relayed to pharmacy to remove dexamethasone from today's treatment and further treatments.

## 2015-05-14 NOTE — Progress Notes (Signed)
[place]  [phone] [phone]     ID: Bonnie Ward DOB: June 05, 1961  MR#: [id number]  LNL#:[id number]  Patient Care Team: Lollie Sails, MD as PCP - General (Internal Medicine) Milford Cage, OTR Kem Boroughs, FNP as Nurse Practitioner (Nurse Practitioner) Fanny Skates, MD as Consulting Physician (General Surgery) Chauncey Cruel, MD as Consulting Physician (Oncology) Thea Silversmith, MD as Consulting Physician (Radiation Oncology) Rockwell Germany, RN as Registered Nurse Mauro Kaufmann, RN as Registered Nurse Holley Bouche, NP as Nurse Practitioner (Nurse Practitioner) Kristeen Miss, MD as Consulting Physician (Neurosurgery) PCP: Lollie Sails, MD OTHER MD:  CHIEF COMPLAINT: Estrogen receptor positive breast cancer  CURRENT TREATMENT: adjuvant chemotherapy   BREAST CANCER HISTORY: From the initial intake note:   Bonnie Ward had routine bilateral screening mammography at the [facility] for 20 01/09/2015 showing a possible mass in the right breast. On 12/22/2014 she underwent diagnostic right mammography with tomosynthesis and right breast ultrasonography. The breast density was category C. In the right breast there was an area of increased density with architectural distortion and faint microcalcifications in the upper outer quadrant. There was mild palpable soft tissue thickening in the area in question, but no palpable right axillary lymph nodes. Ultrasound confirmed a 1.7 cm irregular hypoechoic mass. There were no abnormal-appearing right axillary lymph nodes.  Biopsy of the right breast mass in question 12/26/2014 showed (SAA 16-08/08/2005) an invasive ductal carcinoma, grade 1 or 2, with a prognostic panel still pending.  Her subsequent history is as detailed below  INTERVAL HISTORY: Bonnie Ward returns today for follow-up of her breast cancer. Today is day 1, cycle 3 of paclitaxel, with trastuzumab and pertuzumab given  on every 3rd week of treatment.   REVIEW OF SYSTEMS: Bonnie Ward tolerated the 2nd cycle of paclitaxel much better than the first, as it was not given with the antibodies. She denies fevers or chills. Her nausea was minimal even without the steroids as a predmed. She had just 1 episode of loose stools. Her appetite is good. She denies mouth sores, or neuropathy symptoms. The rash to her arms is still present, but is no worse than before. Her fatigue was not as bad, and she denies pain. A detailed review of systems is otherwise stable.   PAST MEDICAL HISTORY: Past Medical History  Diagnosis Date  . ACID REFLUX DISEASE 07/12/2010  . Arachnoid cyst     left frontal lobe  . Migraines   . Arthritis     hands  . Nervous stomach   . Difficulty swallowing pills   . Hypothyroidism   . Stress fracture of foot 12/2014    right  . Glaucoma   . Breast cancer 12/2014    right  . Anxiety   . Dental crowns present   . Seasonal allergies   . Complication of anesthesia 2012    states woke up during colonoscopy with Propofol    PAST SURGICAL HISTORY: Past Surgical History  Procedure Laterality Date  . Tonsillectomy  age 87  . Esophagogastroduodenoscopy endoscopy    . Tooth extraction    . Lasik    . Colonoscopy with propofol  10/14/2013  . Radioactive seed guided mastectomy with axillary sentinel lymph node biopsy Right 01/23/2015    Procedure: RIGHT PARTIAL MASTECTOMY WITH RADIOACTIVE SEED LOCALIZATION  WITH RIGHT AXILLARY SENTINEL LYMPH NODE BIOPSY;  Surgeon: Fanny Skates, MD;  Location: [facility];  Service: General;  Laterality: Right;  . Portacath placement Left 01/23/2015  Procedure: INSERTION PORT-A-CATH ;  Surgeon: Fanny Skates, MD;  Location: [facility];  Service: General;  Laterality: Left;  . Re-excision of breast lumpectomy Right 02/16/2015    Procedure: RE-EXCISION OF RIGHT  BREAST LUMPECTOMY MARGINS;  Surgeon: Fanny Skates, MD;  Location: [facility];  Service: General;  Laterality: Right;    FAMILY HISTORY Family History  Problem Relation Age of Onset  . Osteoporosis Mother   . Irritable bowel syndrome Mother   . Hypertension Mother   . Hypertension Father   . Hyperlipidemia Father   . Diabetes Father   . Dementia Father   . Colon cancer Maternal Grandfather 62  . Breast cancer Paternal Aunt   . Breast cancer Paternal Grandmother    the patient's father died at age 70 from pneumonia. [name] died at age 66 after bowel perforation. The patient had one brother, one sister. There is no history of breast or ovarian cancer in the family.  GYNECOLOGIC HISTORY:  Patient's last menstrual period was 01/25/2011. Menarche age 66, the patient is GX P0. She stopped having periods approximately 2011. She is still on hormone replacement, but is "trickling off it". She did take birth control pills for approximately 3 years remotely with no complications  SOCIAL HISTORY:  Aura works as a Haematologist. She is divorced and lives by herself, with 2 dogs.    ADVANCED DIRECTIVES: The patient's brother [name] is her healthcare power of attorney. He may be reached in [place] at [place]: Social History  Substance Use Topics  . Smoking status: Former Smoker -- 0.00 packs/day for 0 years    Quit date: 02/19/2005  . Smokeless tobacco: Never Used  . Alcohol Use: Yes     Comment: occasionally     Colonoscopy: February 2016  PAP: June 2015  Bone density: October 2015  Lipid panel:  Allergies  Allergen Reactions  . Lortab [Hydrocodone-Acetaminophen] Anxiety  . Milk-Related Compounds Diarrhea    GI UPSET    Current Outpatient Prescriptions  Medication Sig Dispense Refill  . ALPRAZolam (XANAX) 0.5 MG tablet Take 1 tablet by mouth as needed.  4  . bimatoprost (LUMIGAN) 0.03 % ophthalmic solution Place 1 drop into both eyes at bedtime.    . clindamycin (CLINDAGEL) 1 % gel Apply  topically 2 (two) times daily. 30 g 0  . lidocaine-prilocaine (EMLA) cream APP EXT AA 1 TIME  3  . liothyronine (CYTOMEL) 5 MCG tablet Take 5 mcg by mouth 2 (two) times daily.    Marland Kitchen LORazepam (ATIVAN) 0.5 MG tablet TK 1 T PO  HS  0  . LUMIGAN 0.01 % SOLN Place 1 drop into both eyes at bedtime.  0  . metoCLOPramide (REGLAN) 5 MG tablet Take 1 tablet (5 mg total) by mouth every 6 (six) hours as needed for nausea. 60 tablet 2  . omeprazole (PRILOSEC) 40 MG capsule Take 1 capsule (40 mg total) by mouth daily. 30 capsule 3  . valACYclovir (VALTREX) 500 MG tablet Take 1 tablet (500 mg total) by mouth daily. 30 tablet 3  . acetaminophen (TYLENOL) 325 MG tablet Take 650 mg by mouth every 6 (six) hours as needed.    . ondansetron (ZOFRAN) 8 MG tablet   1  . sucralfate (CARAFATE) 1 GM/10ML suspension Take 10 mLs (1 g total) by mouth 4 (four) times daily -  with meals and at bedtime. (Patient not taking: Reported on 04/23/2015) 420 mL 0  . SUMAtriptan (  IMITREX) 50 MG tablet TAKE 1 TABLET BY MOUTH AT THE ONSET OF HEADACHE. TAKE NO MORE THAN 4 TABLETS BY MOUTH IN 24 HOURS (Patient not taking: Reported on 04/23/2015) 9 tablet 5  . triamcinolone cream (KENALOG) 0.1 % Apply 1 application topically 2 (two) times daily. (Patient not taking: Reported on 05/14/2015) 45 g 0   No current facility-administered medications for this visit.   Facility-Administered Medications Ordered in Other Visits  Medication Dose Route Frequency Provider Last Rate Last Dose  . famotidine (PEPCID) IVPB 20 mg premix  20 mg Intravenous Once Chauncey Cruel, MD      . heparin lock flush 100 unit/mL  500 Units Intracatheter Once PRN Chauncey Cruel, MD      . ondansetron (ZOFRAN) 8 mg in sodium chloride 0.9 % 50 mL IVPB   Intravenous Once Laurie Panda, NP      . PACLitaxel (TAXOL) 144 mg in dextrose 5 % 250 mL chemo infusion (</= 2m/m2)  80 mg/m2 (Treatment Plan Actual) Intravenous Once GChauncey Cruel MD      . sodium chloride  0.9 % injection 10 mL  10 mL Intracatheter PRN GChauncey Cruel MD   10 mL at 04/16/15 1209  . sodium chloride 0.9 % injection 10 mL  10 mL Intracatheter PRN GChauncey Cruel MD        OBJECTIVE: Middle-aged white woman in no acute distress Filed Vitals:   05/14/15 0957  BP: 135/80  Pulse: 88  Temp: 98.3 F (36.8 C)  Resp: 18     Body mass index is 24.76 kg/(m^2).    ECOG FS:1 - Symptomatic but completely ambulatory  Skin: warm, dry  HEENT: sclerae anicteric, conjunctivae pink, oropharynx clear. No thrush or mucositis.  Lymph Nodes: No cervical or supraclavicular lymphadenopathy  Lungs: clear to auscultation bilaterally, no rales, wheezes, or rhonci  Heart: regular rate and rhythm  Abdomen: round, soft, non tender, positive bowel sounds  Musculoskeletal: No focal spinal tenderness, no peripheral edema  Neuro: non focal, well oriented, positive affect  Breasts: deferred  LAB RESULTS:  CMP     Component Value Date/Time   NA 139 05/14/2015 0943   NA 140 01/21/2009 0828   K 4.2 05/14/2015 0943   K 4.1 01/21/2009 0828   CL 108 01/21/2009 0828   CO2 28 05/14/2015 0943   CO2 30 01/21/2009 0828   GLUCOSE 95 05/14/2015 0943   GLUCOSE 90 01/21/2009 0828   BUN 11.1 05/14/2015 0943   BUN 13 01/21/2009 0828   CREATININE 0.7 05/14/2015 0943   CREATININE 0.7 01/21/2009 0828   CALCIUM 9.0 05/14/2015 0943   CALCIUM 8.5 01/21/2009 0828   PROT 6.3* 05/14/2015 0943   PROT 6.9 01/21/2009 0828   ALBUMIN 3.7 05/14/2015 0943   ALBUMIN 3.7 01/21/2009 0828   AST 28 05/14/2015 0943   AST 22 01/21/2009 0828   ALT 46 05/14/2015 0943   ALT 19 01/21/2009 0828   ALKPHOS 65 05/14/2015 0943   ALKPHOS 43 01/21/2009 0828   BILITOT 0.44 05/14/2015 0943   BILITOT 1.1 01/21/2009 0828   GFRNONAA 95.01 01/21/2009 0828    INo results found for: SPEP, UPEP  Lab Results  Component Value Date   WBC 3.3* 05/14/2015   NEUTROABS 2.1 05/14/2015   HGB 9.5* 05/14/2015   HCT 28.3* 05/14/2015   MCV  98.9 05/14/2015   PLT 305 05/14/2015      Chemistry      Component Value Date/Time   NA 139  05/14/2015 0943   NA 140 01/21/2009 0828   K 4.2 05/14/2015 0943   K 4.1 01/21/2009 0828   CL 108 01/21/2009 0828   CO2 28 05/14/2015 0943   CO2 30 01/21/2009 0828   BUN 11.1 05/14/2015 0943   BUN 13 01/21/2009 0828   CREATININE 0.7 05/14/2015 0943   CREATININE 0.7 01/21/2009 0828      Component Value Date/Time   CALCIUM 9.0 05/14/2015 0943   CALCIUM 8.5 01/21/2009 0828   ALKPHOS 65 05/14/2015 0943   ALKPHOS 43 01/21/2009 0828   AST 28 05/14/2015 0943   AST 22 01/21/2009 0828   ALT 46 05/14/2015 0943   ALT 19 01/21/2009 0828   BILITOT 0.44 05/14/2015 0943   BILITOT 1.1 01/21/2009 0828       No results found for: LABCA2  No components found for: GLOVF643  No results for input(s): INR in the last 168 hours.  Urinalysis    Component Value Date/Time   COLORURINE yellow 10/05/2009 1454   APPEARANCEUR Clear 10/05/2009 1454   LABSPEC 1.010 10/05/2009 1454   PHURINE 7.0 10/05/2009 1454   HGBUR 2+ 10/05/2009 1454   BILIRUBINUR neg 04/13/2015 1605   BILIRUBINUR negative 10/05/2009 1454   PROTEINUR neg 04/13/2015 1605   UROBILINOGEN negative 04/13/2015 1605   UROBILINOGEN 0.2 10/05/2009 1454   NITRITE neg 04/13/2015 1605   NITRITE negative 10/05/2009 1454   LEUKOCYTESUR Negative 04/13/2015 1605    STUDIES: No results found.  ASSESSMENT: 54 y.o. [place] woman status post right breast biopsy 12/26/2014 for a clinical T1 cN0, stage IA invasive ductal carcinoma, grade 1 or 2, estrogen and progesterone receptor positive, with an MIB-1 of 11%, and HER-2 amplified, with a signals ratio of 2.07, number per cell 2.28.  (1) status post right lumpectomy and sentinel lymph node sampling 01/23/2015 for a pT1c pN1, stage IIA invasive ductal carcinoma, grade 1, with focally positive anterior margins  (a) additional surgery for margin clearance 02/16/2015 followed residual  disease  (2) chemotherapy starting 03/05/2015, to consist of doxorubicin and cyclophosphamide in dose dense fashion 4, to be followed by paclitaxel weekly,  with trastuzumab and pertuzumab given every 3 weeks   (3) trastuzumab will be continued to complete 1 year  (a) echocardiogram 04/28/2015 shows a normal ejection fraction  (4) adjuvant radiation to follow chemotherapy  (5) anti-estrogens to follow radiation  PLAN: Bonnie Ward is feeling improved from last week. The labs were reviewed in detail and were stable. She will proceed with cycle 3 of paclitaxel as planned.   Bonnie Ward will return in 2 weeks for next cycle of treatment which will include trastuzumab and pertuzumab, as she is taking a break for a vacation next week. She understands and agrees with this plan. She knows the goal of treatment in he case is cure. She has been encouraged to call with any issues that might arise before her next visit here.   Laurie Panda, NP   05/14/2015 11:13 AM

## 2015-05-21 ENCOUNTER — Other Ambulatory Visit: Payer: BLUE CROSS/BLUE SHIELD

## 2015-05-21 ENCOUNTER — Ambulatory Visit: Payer: BLUE CROSS/BLUE SHIELD | Admitting: Nurse Practitioner

## 2015-05-21 ENCOUNTER — Ambulatory Visit: Payer: BLUE CROSS/BLUE SHIELD

## 2015-05-26 ENCOUNTER — Telehealth: Payer: Self-pay | Admitting: Oncology

## 2015-05-26 ENCOUNTER — Other Ambulatory Visit: Payer: Self-pay | Admitting: Nurse Practitioner

## 2015-05-26 NOTE — Telephone Encounter (Signed)
Added appts for NOV pt will get appt in tx or at nxt visit

## 2015-05-28 ENCOUNTER — Ambulatory Visit (HOSPITAL_BASED_OUTPATIENT_CLINIC_OR_DEPARTMENT_OTHER): Payer: BLUE CROSS/BLUE SHIELD | Admitting: Nurse Practitioner

## 2015-05-28 ENCOUNTER — Other Ambulatory Visit (HOSPITAL_BASED_OUTPATIENT_CLINIC_OR_DEPARTMENT_OTHER): Payer: BLUE CROSS/BLUE SHIELD

## 2015-05-28 ENCOUNTER — Telehealth: Payer: Self-pay | Admitting: Nurse Practitioner

## 2015-05-28 ENCOUNTER — Encounter: Payer: Self-pay | Admitting: Nurse Practitioner

## 2015-05-28 ENCOUNTER — Ambulatory Visit (HOSPITAL_BASED_OUTPATIENT_CLINIC_OR_DEPARTMENT_OTHER): Payer: BLUE CROSS/BLUE SHIELD

## 2015-05-28 ENCOUNTER — Other Ambulatory Visit: Payer: Self-pay | Admitting: Hematology and Oncology

## 2015-05-28 VITALS — BP 112/77 | HR 87 | Temp 97.8°F | Resp 18 | Ht 66.5 in | Wt 156.6 lb

## 2015-05-28 DIAGNOSIS — C50411 Malignant neoplasm of upper-outer quadrant of right female breast: Secondary | ICD-10-CM | POA: Diagnosis not present

## 2015-05-28 DIAGNOSIS — Z5112 Encounter for antineoplastic immunotherapy: Secondary | ICD-10-CM | POA: Diagnosis not present

## 2015-05-28 DIAGNOSIS — Z5111 Encounter for antineoplastic chemotherapy: Secondary | ICD-10-CM

## 2015-05-28 DIAGNOSIS — Z170 Estrogen receptor positive status [ER+]: Secondary | ICD-10-CM | POA: Diagnosis not present

## 2015-05-28 DIAGNOSIS — T451X5A Adverse effect of antineoplastic and immunosuppressive drugs, initial encounter: Secondary | ICD-10-CM

## 2015-05-28 DIAGNOSIS — G620 Drug-induced polyneuropathy: Secondary | ICD-10-CM | POA: Insufficient documentation

## 2015-05-28 DIAGNOSIS — G629 Polyneuropathy, unspecified: Secondary | ICD-10-CM | POA: Diagnosis not present

## 2015-05-28 LAB — CBC WITH DIFFERENTIAL/PLATELET
BASO%: 0.7 % (ref 0.0–2.0)
BASOS ABS: 0 10*3/uL (ref 0.0–0.1)
EOS%: 4 % (ref 0.0–7.0)
Eosinophils Absolute: 0.1 10*3/uL (ref 0.0–0.5)
HEMATOCRIT: 34.5 % — AB (ref 34.8–46.6)
HGB: 11.6 g/dL (ref 11.6–15.9)
LYMPH#: 0.7 10*3/uL — AB (ref 0.9–3.3)
LYMPH%: 19 % (ref 14.0–49.7)
MCH: 32.9 pg (ref 25.1–34.0)
MCHC: 33.5 g/dL (ref 31.5–36.0)
MCV: 98.2 fL (ref 79.5–101.0)
MONO#: 0.6 10*3/uL (ref 0.1–0.9)
MONO%: 17.2 % — AB (ref 0.0–14.0)
NEUT#: 2.1 10*3/uL (ref 1.5–6.5)
NEUT%: 59.1 % (ref 38.4–76.8)
Platelets: 292 10*3/uL (ref 145–400)
RBC: 3.51 10*6/uL — AB (ref 3.70–5.45)
RDW: 18 % — ABNORMAL HIGH (ref 11.2–14.5)
WBC: 3.5 10*3/uL — ABNORMAL LOW (ref 3.9–10.3)

## 2015-05-28 LAB — COMPREHENSIVE METABOLIC PANEL (CC13)
ALT: 41 U/L (ref 0–55)
AST: 33 U/L (ref 5–34)
Albumin: 4 g/dL (ref 3.5–5.0)
Alkaline Phosphatase: 69 U/L (ref 40–150)
Anion Gap: 7 mEq/L (ref 3–11)
BUN: 11.5 mg/dL (ref 7.0–26.0)
CHLORIDE: 105 meq/L (ref 98–109)
CO2: 27 meq/L (ref 22–29)
CREATININE: 0.7 mg/dL (ref 0.6–1.1)
Calcium: 9.4 mg/dL (ref 8.4–10.4)
EGFR: >90 mL/min/{1.73_m2} (ref >90)
GLUCOSE: 86 mg/dL (ref 70–140)
POTASSIUM: 4 meq/L (ref 3.5–5.1)
SODIUM: 140 meq/L (ref 136–145)
Total Bilirubin: 0.59 mg/dL (ref 0.20–1.20)
Total Protein: 6.9 g/dL (ref 6.4–8.3)

## 2015-05-28 MED ORDER — PACLITAXEL CHEMO INJECTION 300 MG/50ML
80.0000 mg/m2 | Freq: Once | INTRAVENOUS | Status: AC
  Administered 2015-05-28: 144 mg via INTRAVENOUS
  Filled 2015-05-28: qty 24

## 2015-05-28 MED ORDER — TRASTUZUMAB CHEMO INJECTION 440 MG
6.0000 mg/kg | Freq: Once | INTRAVENOUS | Status: AC
  Administered 2015-05-28: 420 mg via INTRAVENOUS
  Filled 2015-05-28: qty 20

## 2015-05-28 MED ORDER — SODIUM CHLORIDE 0.9 % IJ SOLN
10.0000 mL | INTRAMUSCULAR | Status: DC | PRN
  Administered 2015-05-28: 10 mL
  Filled 2015-05-28: qty 10

## 2015-05-28 MED ORDER — SODIUM CHLORIDE 0.9 % IV SOLN
420.0000 mg | Freq: Once | INTRAVENOUS | Status: AC
  Administered 2015-05-28: 420 mg via INTRAVENOUS
  Filled 2015-05-28: qty 14

## 2015-05-28 MED ORDER — SODIUM CHLORIDE 0.9 % IV SOLN
Freq: Once | INTRAVENOUS | Status: AC
  Administered 2015-05-28: 12:00:00 via INTRAVENOUS
  Filled 2015-05-28: qty 4

## 2015-05-28 MED ORDER — ACETAMINOPHEN 325 MG PO TABS
ORAL_TABLET | ORAL | Status: AC
  Filled 2015-05-28: qty 2

## 2015-05-28 MED ORDER — DIPHENHYDRAMINE HCL 25 MG PO CAPS
ORAL_CAPSULE | ORAL | Status: AC
  Filled 2015-05-28: qty 1

## 2015-05-28 MED ORDER — DIPHENHYDRAMINE HCL 50 MG/ML IJ SOLN
INTRAMUSCULAR | Status: AC
Start: 2015-05-28 — End: 2015-05-28
  Filled 2015-05-28: qty 1

## 2015-05-28 MED ORDER — HEPARIN SOD (PORK) LOCK FLUSH 100 UNIT/ML IV SOLN
500.0000 [IU] | Freq: Once | INTRAVENOUS | Status: AC | PRN
  Administered 2015-05-28: 500 [IU]
  Filled 2015-05-28: qty 5

## 2015-05-28 MED ORDER — ACETAMINOPHEN 325 MG PO TABS
650.0000 mg | ORAL_TABLET | Freq: Once | ORAL | Status: AC
  Administered 2015-05-28: 650 mg via ORAL

## 2015-05-28 MED ORDER — SODIUM CHLORIDE 0.9 % IV SOLN
Freq: Once | INTRAVENOUS | Status: AC
  Administered 2015-05-28: 10:00:00 via INTRAVENOUS

## 2015-05-28 MED ORDER — FAMOTIDINE IN NACL 20-0.9 MG/50ML-% IV SOLN
INTRAVENOUS | Status: AC
  Filled 2015-05-28: qty 50

## 2015-05-28 MED ORDER — DIPHENHYDRAMINE HCL 50 MG/ML IJ SOLN
25.0000 mg | Freq: Once | INTRAMUSCULAR | Status: AC
  Administered 2015-05-28: 25 mg via INTRAVENOUS

## 2015-05-28 MED ORDER — FAMOTIDINE IN NACL 20-0.9 MG/50ML-% IV SOLN
20.0000 mg | Freq: Once | INTRAVENOUS | Status: AC
  Administered 2015-05-28: 20 mg via INTRAVENOUS

## 2015-05-28 MED ORDER — DIPHENHYDRAMINE HCL 25 MG PO CAPS
25.0000 mg | ORAL_CAPSULE | Freq: Once | ORAL | Status: AC
  Administered 2015-05-28: 25 mg via ORAL

## 2015-05-28 NOTE — Progress Notes (Signed)
[facility]  [phone] [phone]) [phone]     ID: Bonnie Ward DOB: 12-03-1960  MR#: [id number]  LNL#:[id number]  Patient Care Team: Lollie Sails, MD as PCP - General (Internal Medicine) Milford Cage, OTR Kem Boroughs, FNP as Nurse Practitioner (Nurse Practitioner) Fanny Skates, MD as Consulting Physician (General Surgery) Chauncey Cruel, MD as Consulting Physician (Oncology) Thea Silversmith, MD as Consulting Physician (Radiation Oncology) Rockwell Germany, RN as Registered Nurse Mauro Kaufmann, RN as Registered Nurse Holley Bouche, NP as Nurse Practitioner (Nurse Practitioner) Kristeen Miss, MD as Consulting Physician (Neurosurgery) PCP: Lollie Sails, MD OTHER MD:  CHIEF COMPLAINT: Estrogen receptor positive breast cancer  CURRENT TREATMENT: adjuvant chemotherapy   BREAST CANCER HISTORY: From the initial intake note:   Bonnie Ward had routine bilateral screening mammography at the [facility] for 20 01/09/2015 showing a possible mass in the right breast. On 12/22/2014 she underwent diagnostic right mammography with tomosynthesis and right breast ultrasonography. The breast density was category C. In the right breast there was an area of increased density with architectural distortion and faint microcalcifications in the upper outer quadrant. There was mild palpable soft tissue thickening in the area in question, but no palpable right axillary lymph nodes. Ultrasound confirmed a 1.7 cm irregular hypoechoic mass. There were no abnormal-appearing right axillary lymph nodes.  Biopsy of the right breast mass in question 12/26/2014 showed ([facility] 16-08/08/2005) an invasive ductal carcinoma, grade 1 or 2, with a prognostic panel still pending.  Her subsequent history is as detailed below  INTERVAL HISTORY: [name] returns today for follow-up of her breast cancer. Today is day 1, cycle 4 of paclitaxel, with trastuzumab and pertuzumab given  on every 3rd week of treatment which are both due today. She had last week off to go on a beach vacation, which was enjoyable.  REVIEW OF SYSTEMS: Rikia denies fevers or chills. Her nausea is minimal. She is moving her bowels well this past week with no constipation. She is eating well and focusing on iron rich food. She denies mouth sores. She has a continued full body rash, which is improving. New this week is intermittent numbness and pain to her bilateral feet. This is not present on today's exam. She denies shortness of breath, chest pain, cough, or palpitations. A detailed review of systems is otherwise stable.   PAST MEDICAL HISTORY: Past Medical History  Diagnosis Date  . ACID REFLUX DISEASE 07/12/2010  . Arachnoid cyst     left frontal lobe  . Migraines   . Arthritis     hands  . Nervous stomach   . Difficulty swallowing pills   . Hypothyroidism   . Stress fracture of foot 12/2014    right  . Glaucoma   . Breast cancer 12/2014    right  . Anxiety   . Dental crowns present   . Seasonal allergies   . Complication of anesthesia 2012    states woke up during colonoscopy with Propofol    PAST SURGICAL HISTORY: Past Surgical History  Procedure Laterality Date  . Tonsillectomy  age 76  . Esophagogastroduodenoscopy endoscopy    . Tooth extraction    . Lasik    . Colonoscopy with propofol  10/14/2013  . Radioactive seed guided mastectomy with axillary sentinel lymph node biopsy Right 01/23/2015    Procedure: RIGHT PARTIAL MASTECTOMY WITH RADIOACTIVE SEED LOCALIZATION  WITH RIGHT AXILLARY SENTINEL LYMPH NODE BIOPSY;  Surgeon: Fanny Skates, MD;  Location: [facility];  Service:  General;  Laterality: Right;  . Portacath placement Left 01/23/2015    Procedure: INSERTION PORT-A-CATH ;  Surgeon: Fanny Skates, MD;  Location: [facility];  Service: General;  Laterality: Left;  . Re-excision of breast lumpectomy Right 02/16/2015    Procedure: RE-EXCISION OF  RIGHT  BREAST LUMPECTOMY MARGINS;  Surgeon: Fanny Skates, MD;  Location: [facility];  Service: General;  Laterality: Right;    FAMILY HISTORY Family History  Problem Relation Age of Onset  . Osteoporosis Mother   . Irritable bowel syndrome Mother   . Hypertension Mother   . Hypertension Father   . Hyperlipidemia Father   . Diabetes Father   . Dementia Father   . Colon cancer Maternal Grandfather 41  . Breast cancer Paternal Aunt   . Breast cancer Paternal Grandmother    the patient's father died at age 58 from pneumonia. [name] died at age 2 after bowel perforation. The patient had one brother, one sister. There is no history of breast or ovarian cancer in the family.  GYNECOLOGIC HISTORY:  Patient's last menstrual period was 01/25/2011. Menarche age 89, the patient is GX P0. She stopped having periods approximately 2011. She is still on hormone replacement, but is "trickling off it". She did take birth control pills for approximately 3 years remotely with no complications  SOCIAL HISTORY:  Jordyan works as a Haematologist. She is divorced and lives by herself, with 2 dogs.    ADVANCED DIRECTIVES: The patient's brother [name] is her healthcare power of attorney. He may be reached in [place] at [place]: Social History  Substance Use Topics  . Smoking status: Former Smoker -- 0.00 packs/day for 0 years    Quit date: 02/19/2005  . Smokeless tobacco: Never Used  . Alcohol Use: Yes     Comment: occasionally     Colonoscopy: February 2016  PAP: June 2015  Bone density: October 2015  Lipid panel:  Allergies  Allergen Reactions  . Lortab [Hydrocodone-Acetaminophen] Anxiety  . Milk-Related Compounds Diarrhea    GI UPSET    Current Outpatient Prescriptions  Medication Sig Dispense Refill  . ALPRAZolam (XANAX) 0.5 MG tablet Take 1 tablet by mouth as needed.  4  . bimatoprost (LUMIGAN) 0.03 % ophthalmic solution  Place 1 drop into both eyes at bedtime.    . clindamycin (CLINDAGEL) 1 % gel Apply topically 2 (two) times daily. 30 g 0  . lidocaine-prilocaine (EMLA) cream APP EXT AA 1 TIME  3  . liothyronine (CYTOMEL) 5 MCG tablet Take 5 mcg by mouth 2 (two) times daily.    Marland Kitchen LORazepam (ATIVAN) 0.5 MG tablet TK 1 T PO  HS  0  . LUMIGAN 0.01 % SOLN Place 1 drop into both eyes at bedtime.  0  . omeprazole (PRILOSEC) 40 MG capsule Take 1 capsule (40 mg total) by mouth daily. 30 capsule 3  . ondansetron (ZOFRAN) 8 MG tablet   1  . acetaminophen (TYLENOL) 325 MG tablet Take 650 mg by mouth every 6 (six) hours as needed.    . metoCLOPramide (REGLAN) 5 MG tablet Take 1 tablet (5 mg total) by mouth every 6 (six) hours as needed for nausea. (Patient not taking: Reported on 05/28/2015) 60 tablet 2  . sucralfate (CARAFATE) 1 GM/10ML suspension Take 10 mLs (1 g total) by mouth 4 (four) times daily -  with meals and at bedtime. (Patient not taking: Reported on 04/23/2015) 420 mL 0  . SUMAtriptan (  IMITREX) 50 MG tablet TAKE 1 TABLET BY MOUTH AT THE ONSET OF HEADACHE. TAKE NO MORE THAN 4 TABLETS BY MOUTH IN 24 HOURS (Patient not taking: Reported on 04/23/2015) 9 tablet 5  . triamcinolone cream (KENALOG) 0.1 % Apply 1 application topically 2 (two) times daily. (Patient not taking: Reported on 05/14/2015) 45 g 0  . valACYclovir (VALTREX) 500 MG tablet Take 1 tablet (500 mg total) by mouth daily. (Patient not taking: Reported on 05/28/2015) 30 tablet 3   No current facility-administered medications for this visit.   Facility-Administered Medications Ordered in Other Visits  Medication Dose Route Frequency Provider Last Rate Last Dose  . sodium chloride 0.9 % injection 10 mL  10 mL Intracatheter PRN Chauncey Cruel, MD   10 mL at 04/16/15 1209    OBJECTIVE: Middle-aged white woman in no acute distress Filed Vitals:   05/28/15 0836  BP: 112/77  Pulse: 87  Temp: 97.8 F (36.6 C)  Resp: 18     Body mass index is 24.9  kg/(m^2).    ECOG FS:1 - Symptomatic but completely ambulatory  Sclerae unicteric, pupils round and equal Oropharynx clear and moist-- no thrush or other lesions No cervical or supraclavicular adenopathy Lungs no rales or rhonchi Heart regular rate and rhythm Abd soft, nontender, positive bowel sounds MSK no focal spinal tenderness, no upper extremity lymphedema Neuro: nonfocal, well oriented, appropriate affect Breasts: deferred  LAB RESULTS:  CMP     Component Value Date/Time   NA 140 05/28/2015 0824   NA 140 01/21/2009 0828   K 4.0 05/28/2015 0824   K 4.1 01/21/2009 0828   CL 108 01/21/2009 0828   CO2 27 05/28/2015 0824   CO2 30 01/21/2009 0828   GLUCOSE 86 05/28/2015 0824   GLUCOSE 90 01/21/2009 0828   BUN 11.5 05/28/2015 0824   BUN 13 01/21/2009 0828   CREATININE 0.7 05/28/2015 0824   CREATININE 0.7 01/21/2009 0828   CALCIUM 9.4 05/28/2015 0824   CALCIUM 8.5 01/21/2009 0828   PROT 6.9 05/28/2015 0824   PROT 6.9 01/21/2009 0828   ALBUMIN 4.0 05/28/2015 0824   ALBUMIN 3.7 01/21/2009 0828   AST 33 05/28/2015 0824   AST 22 01/21/2009 0828   ALT 41 05/28/2015 0824   ALT 19 01/21/2009 0828   ALKPHOS 69 05/28/2015 0824   ALKPHOS 43 01/21/2009 0828   BILITOT 0.59 05/28/2015 0824   BILITOT 1.1 01/21/2009 0828   GFRNONAA 95.01 01/21/2009 0828    INo results found for: SPEP, UPEP  Lab Results  Component Value Date   WBC 3.5* 05/28/2015   NEUTROABS 2.1 05/28/2015   HGB 11.6 05/28/2015   HCT 34.5* 05/28/2015   MCV 98.2 05/28/2015   PLT 292 05/28/2015      Chemistry      Component Value Date/Time   NA 140 05/28/2015 0824   NA 140 01/21/2009 0828   K 4.0 05/28/2015 0824   K 4.1 01/21/2009 0828   CL 108 01/21/2009 0828   CO2 27 05/28/2015 0824   CO2 30 01/21/2009 0828   BUN 11.5 05/28/2015 0824   BUN 13 01/21/2009 0828   CREATININE 0.7 05/28/2015 0824   CREATININE 0.7 01/21/2009 0828      Component Value Date/Time   CALCIUM 9.4 05/28/2015 0824    CALCIUM 8.5 01/21/2009 0828   ALKPHOS 69 05/28/2015 0824   ALKPHOS 43 01/21/2009 0828   AST 33 05/28/2015 0824   AST 22 01/21/2009 0828   ALT 41 05/28/2015 0824  ALT 19 01/21/2009 0828   BILITOT 0.59 05/28/2015 0824   BILITOT 1.1 01/21/2009 0828       No results found for: LABCA2  No components found for: LABCA125  No results for input(s): INR in the last 168 hours.  Urinalysis    Component Value Date/Time   COLORURINE yellow 10/05/2009 1454   APPEARANCEUR Clear 10/05/2009 1454   LABSPEC 1.010 10/05/2009 1454   PHURINE 7.0 10/05/2009 1454   HGBUR 2+ 10/05/2009 1454   BILIRUBINUR neg 04/13/2015 1605   BILIRUBINUR negative 10/05/2009 1454   PROTEINUR neg 04/13/2015 1605   UROBILINOGEN negative 04/13/2015 1605   UROBILINOGEN 0.2 10/05/2009 1454   NITRITE neg 04/13/2015 1605   NITRITE negative 10/05/2009 1454   LEUKOCYTESUR Negative 04/13/2015 1605    STUDIES: No results found.  ASSESSMENT: 54 y.o. [place] woman status post right breast biopsy 12/26/2014 for a clinical T1 cN0, stage IA invasive ductal carcinoma, grade 1 or 2, estrogen and progesterone receptor positive, with an MIB-1 of 11%, and HER-2 amplified, with a signals ratio of 2.07, number per cell 2.28.  (1) status post right lumpectomy and sentinel lymph node sampling 01/23/2015 for a pT1c pN1, stage IIA invasive ductal carcinoma, grade 1, with focally positive anterior margins  (a) additional surgery for margin clearance 02/16/2015 followed residual disease  (2) chemotherapy starting 03/05/2015, to consist of doxorubicin and cyclophosphamide in dose dense fashion 4, to be followed by paclitaxel weekly,  with trastuzumab and pertuzumab given every 3 weeks   (3) trastuzumab will be continued to complete 1 year  (a) echocardiogram 04/28/2015 shows a normal ejection fraction  (4) adjuvant radiation to follow chemotherapy  (5) anti-estrogens to follow radiation  PLAN: Ivelise feels hesitant about treatment  today due to her experience with the antibodies last time, but she is willing to give it a second try. The labs were reviewed in detail and were stable. Her anemia has resolved. She will proceed with cycle 4 of taxol along with cycle 2 of herceptin and perjeta. We will review her budding neuropathy symptoms next week. She understands that peripheral neuropathy can be a permanent condition if not managed delicately. She may need the week off next week.  Dao will return in 1 week for cycle 5 of paclitaxel. She understands and agrees with this plan. She knows the goal of treatment in he case is cure. She has been encouraged to call with any issues that might arise before her next visit here.   Laurie Panda, NP   05/28/2015 9:07 AM

## 2015-05-28 NOTE — Progress Notes (Signed)
Pt states she can feel her feet getting a bit more numb during the taxol infusion. This has happened before and Sherell wants Dr. Jana Hakim to know. Notified. MD/Heather

## 2015-05-28 NOTE — Patient Instructions (Signed)
[facility] Discharge Instructions for Patients Receiving Chemotherapy  Today you received the following chemotherapy agents : Herceptin, Perjeta and Taxol. To help prevent nausea and vomiting after your treatment, we encourage you to take your nausea medication: Zofran 8 mg every 8 hours as needed.   If you develop nausea and vomiting that is not controlled by your nausea medication, call the clinic.   BELOW ARE SYMPTOMS THAT SHOULD BE REPORTED IMMEDIATELY:  *FEVER GREATER THAN 100.5 F  *CHILLS WITH OR WITHOUT FEVER  NAUSEA AND VOMITING THAT IS NOT CONTROLLED WITH YOUR NAUSEA MEDICATION  *UNUSUAL SHORTNESS OF BREATH  *UNUSUAL BRUISING OR BLEEDING  TENDERNESS IN MOUTH AND THROAT WITH OR WITHOUT PRESENCE OF ULCERS  *URINARY PROBLEMS  *BOWEL PROBLEMS  UNUSUAL RASH Items with * indicate a potential emergency and should be followed up as soon as possible.  Feel free to call the clinic you have any questions or concerns. The clinic phone number is [phone].  Please show the [facility] at check-in to the Emergency Department and triage nurse.

## 2015-05-28 NOTE — Telephone Encounter (Signed)
Appointments made and avs printed for pateint °

## 2015-06-01 ENCOUNTER — Other Ambulatory Visit: Payer: Self-pay | Admitting: Nurse Practitioner

## 2015-06-04 ENCOUNTER — Encounter: Payer: Self-pay | Admitting: Nurse Practitioner

## 2015-06-04 ENCOUNTER — Other Ambulatory Visit (HOSPITAL_BASED_OUTPATIENT_CLINIC_OR_DEPARTMENT_OTHER): Payer: BLUE CROSS/BLUE SHIELD

## 2015-06-04 ENCOUNTER — Encounter: Payer: Self-pay | Admitting: *Deleted

## 2015-06-04 ENCOUNTER — Ambulatory Visit: Payer: BLUE CROSS/BLUE SHIELD

## 2015-06-04 ENCOUNTER — Ambulatory Visit (HOSPITAL_BASED_OUTPATIENT_CLINIC_OR_DEPARTMENT_OTHER): Payer: BLUE CROSS/BLUE SHIELD | Admitting: Nurse Practitioner

## 2015-06-04 ENCOUNTER — Other Ambulatory Visit: Payer: Self-pay | Admitting: *Deleted

## 2015-06-04 ENCOUNTER — Other Ambulatory Visit: Payer: Self-pay | Admitting: Nurse Practitioner

## 2015-06-04 VITALS — BP 139/81 | HR 94 | Temp 98.3°F | Resp 18 | Ht 66.5 in | Wt 156.3 lb

## 2015-06-04 DIAGNOSIS — R232 Flushing: Secondary | ICD-10-CM | POA: Insufficient documentation

## 2015-06-04 DIAGNOSIS — C50411 Malignant neoplasm of upper-outer quadrant of right female breast: Secondary | ICD-10-CM

## 2015-06-04 DIAGNOSIS — G620 Drug-induced polyneuropathy: Secondary | ICD-10-CM | POA: Diagnosis not present

## 2015-06-04 DIAGNOSIS — T451X5A Adverse effect of antineoplastic and immunosuppressive drugs, initial encounter: Secondary | ICD-10-CM

## 2015-06-04 DIAGNOSIS — Z170 Estrogen receptor positive status [ER+]: Secondary | ICD-10-CM

## 2015-06-04 LAB — COMPREHENSIVE METABOLIC PANEL (CC13)
ALT: 57 U/L — AB (ref 0–55)
ANION GAP: 6 meq/L (ref 3–11)
AST: 35 U/L — ABNORMAL HIGH (ref 5–34)
Albumin: 4 g/dL (ref 3.5–5.0)
Alkaline Phosphatase: 71 U/L (ref 40–150)
BILIRUBIN TOTAL: 0.45 mg/dL (ref 0.20–1.20)
BUN: 11.9 mg/dL (ref 7.0–26.0)
CALCIUM: 9.5 mg/dL (ref 8.4–10.4)
CHLORIDE: 107 meq/L (ref 98–109)
CO2: 29 mEq/L (ref 22–29)
CREATININE: 0.7 mg/dL (ref 0.6–1.1)
Glucose: 90 mg/dl (ref 70–140)
Potassium: 4.5 mEq/L (ref 3.5–5.1)
Sodium: 141 mEq/L (ref 136–145)
TOTAL PROTEIN: 6.9 g/dL (ref 6.4–8.3)

## 2015-06-04 LAB — CBC WITH DIFFERENTIAL/PLATELET
BASO%: 0.9 % (ref 0.0–2.0)
Basophils Absolute: 0 10*3/uL (ref 0.0–0.1)
EOS ABS: 0.2 10*3/uL (ref 0.0–0.5)
EOS%: 5.2 % (ref 0.0–7.0)
HEMATOCRIT: 33.6 % — AB (ref 34.8–46.6)
HGB: 11.3 g/dL — ABNORMAL LOW (ref 11.6–15.9)
LYMPH#: 0.7 10*3/uL — AB (ref 0.9–3.3)
LYMPH%: 20.2 % (ref 14.0–49.7)
MCH: 33 pg (ref 25.1–34.0)
MCHC: 33.6 g/dL (ref 31.5–36.0)
MCV: 98.1 fL (ref 79.5–101.0)
MONO#: 0.4 10*3/uL (ref 0.1–0.9)
MONO%: 10.2 % (ref 0.0–14.0)
NEUT#: 2.3 10*3/uL (ref 1.5–6.5)
NEUT%: 63.5 % (ref 38.4–76.8)
PLATELETS: 288 10*3/uL (ref 145–400)
RBC: 3.43 10*6/uL — AB (ref 3.70–5.45)
RDW: 16.9 % — ABNORMAL HIGH (ref 11.2–14.5)
WBC: 3.6 10*3/uL — ABNORMAL LOW (ref 3.9–10.3)

## 2015-06-04 MED ORDER — GABAPENTIN 300 MG PO CAPS: 300.0000 mg | ORAL_CAPSULE | Freq: Every day | ORAL | Status: DC

## 2015-06-04 MED ORDER — OMEPRAZOLE 40 MG PO CPDR: 40.0000 mg | DELAYED_RELEASE_CAPSULE | Freq: Every day | ORAL | Status: DC

## 2015-06-04 NOTE — Progress Notes (Signed)
Treatment cancelled per MD.

## 2015-06-04 NOTE — Patient Instructions (Signed)
[facility] Discharge Instructions for Patients Receiving Chemotherapy  Today you received the following chemotherapy agents taxol  To help prevent nausea and vomiting after your treatment, we encourage you to take your nausea medication if needed   If you develop nausea and vomiting that is not controlled by your nausea medication, call the clinic.   BELOW ARE SYMPTOMS THAT SHOULD BE REPORTED IMMEDIATELY:  *FEVER GREATER THAN 100.5 F  *CHILLS WITH OR WITHOUT FEVER  NAUSEA AND VOMITING THAT IS NOT CONTROLLED WITH YOUR NAUSEA MEDICATION  *UNUSUAL SHORTNESS OF BREATH  *UNUSUAL BRUISING OR BLEEDING  TENDERNESS IN MOUTH AND THROAT WITH OR WITHOUT PRESENCE OF ULCERS  *URINARY PROBLEMS  *BOWEL PROBLEMS  UNUSUAL RASH Items with * indicate a potential emergency and should be followed up as soon as possible.  Feel free to call the clinic you have any questions or concerns. The clinic phone number is [phone].  Please show the CHEMO ALERT CARD at check-in to the Emergency Department and triage nurse.   

## 2015-06-04 NOTE — Progress Notes (Signed)
Met with pt for treatment. Pt had to d/c chemo d/t neuropathy. Denies needs at this time. Encourage pt to call with questions or concerns.

## 2015-06-04 NOTE — Progress Notes (Signed)
[place]  [phone] [phone]     ID: Bonnie Ward DOB: 1960/10/23  MR#: [id number]  KKX#:[id number]  Patient Care Team: Bonnie Sails, MD as PCP - General (Internal Medicine) Milford Cage, OTR Kem Boroughs, FNP as Nurse Practitioner (Nurse Practitioner) Fanny Skates, MD as Consulting Physician (General Surgery) Chauncey Cruel, MD as Consulting Physician (Oncology) Thea Silversmith, MD as Consulting Physician (Radiation Oncology) Rockwell Germany, RN as Registered Nurse Mauro Kaufmann, RN as Registered Nurse Holley Bouche, NP as Nurse Practitioner (Nurse Practitioner) Kristeen Miss, MD as Consulting Physician (Neurosurgery) PCP: Bonnie Sails, MD OTHER MD:  CHIEF COMPLAINT: Estrogen receptor positive breast cancer  CURRENT TREATMENT: adjuvant chemotherapy   BREAST CANCER HISTORY: From the initial intake note:   Bonnie Ward had routine bilateral screening mammography at the [facility] for 20 01/09/2015 showing a possible mass in the right breast. On 12/22/2014 she underwent diagnostic right mammography with tomosynthesis and right breast ultrasonography. The breast density was category C. In the right breast there was an area of increased density with architectural distortion and faint microcalcifications in the upper outer quadrant. There was mild palpable soft tissue thickening in the area in question, but no palpable right axillary lymph nodes. Ultrasound confirmed a 1.7 cm irregular hypoechoic mass. There were no abnormal-appearing right axillary lymph nodes.  Biopsy of the right breast mass in question 12/26/2014 showed (SAA 16-08/08/2005) an invasive ductal carcinoma, grade 1 or 2, with a prognostic panel still pending.  Her subsequent history is as detailed below  INTERVAL HISTORY: Bonnie Ward returns today for follow-up of her breast cancer. Today is day 1, cycle 5 of paclitaxel, with trastuzumab and pertuzumab given  on every 3rd week of treatment (next due 10/27).  REVIEW OF SYSTEMS: Bonnie Ward tolerated this second round of antibodies much better than the first with no reactions that she is aware of. Her full body rash is improving. She denies fevers or chills. Her nausea is minimal. She is moving her bowels well with no issues. She is eating well and maintaining her weight. The intermittent numbness and pain to her bilateral feet is now constant and keeping her up at night. She would like to stop chemo at this point. A detailed review of systems is otherwise stable.  PAST MEDICAL HISTORY: Past Medical History  Diagnosis Date  . ACID REFLUX DISEASE 07/12/2010  . Arachnoid cyst     left frontal lobe  . Migraines   . Arthritis     hands  . Nervous stomach   . Difficulty swallowing pills   . Hypothyroidism   . Stress fracture of foot 12/2014    right  . Glaucoma   . Breast cancer ([place]) 12/2014    right  . Anxiety   . Dental crowns present   . Seasonal allergies   . Complication of anesthesia 2012    states woke up during colonoscopy with Propofol    PAST SURGICAL HISTORY: Past Surgical History  Procedure Laterality Date  . Tonsillectomy  age 27  . Esophagogastroduodenoscopy endoscopy    . Tooth extraction    . Lasik    . Colonoscopy with propofol  10/14/2013  . Radioactive seed guided mastectomy with axillary sentinel lymph node biopsy Right 01/23/2015    Procedure: RIGHT PARTIAL MASTECTOMY WITH RADIOACTIVE SEED LOCALIZATION  WITH RIGHT AXILLARY SENTINEL LYMPH NODE BIOPSY;  Surgeon: Fanny Skates, MD;  Location: [facility];  Service: General;  Laterality: Right;  . Portacath placement Left 01/23/2015  Procedure: INSERTION PORT-A-CATH ;  Surgeon: Fanny Skates, MD;  Location: [facility];  Service: General;  Laterality: Left;  . Re-excision of breast lumpectomy Right 02/16/2015    Procedure: RE-EXCISION OF RIGHT  BREAST LUMPECTOMY MARGINS;  Surgeon: Fanny Skates, MD;   Location: [facility];  Service: General;  Laterality: Right;    FAMILY HISTORY Family History  Problem Relation Age of Onset  . Osteoporosis Mother   . Irritable bowel syndrome Mother   . Hypertension Mother   . Hypertension Father   . Hyperlipidemia Father   . Diabetes Father   . Dementia Father   . Colon cancer Maternal Grandfather 73  . Breast cancer Paternal Aunt   . Breast cancer Paternal Grandmother    the patient's father died at age 37 from pneumonia. [name] died at age 37 after bowel perforation. The patient had one brother, one sister. There is no history of breast or ovarian cancer in the family.  GYNECOLOGIC HISTORY:  Patient's last menstrual period was 01/25/2011. Menarche age 10, the patient is GX P0. She stopped having periods approximately 2011. She is still on hormone replacement, but is "trickling off it". She did take birth control pills for approximately 3 years remotely with no complications  SOCIAL HISTORY:  Bonnie Ward works as a Haematologist. She is divorced and lives by herself, with 2 dogs.    ADVANCED DIRECTIVES: The patient's brother Bonnie Ward is her healthcare power of attorney. He may be reached in [place] at [place]: Social History  Substance Use Topics  . Smoking status: Former Smoker -- 0.00 packs/day for 0 years    Quit date: 02/19/2005  . Smokeless tobacco: Never Used  . Alcohol Use: Yes     Comment: occasionally     Colonoscopy: February 2016  PAP: June 2015  Bone density: October 2015  Lipid panel:  Allergies  Allergen Reactions  . Lortab [Hydrocodone-Acetaminophen] Anxiety  . Milk-Related Compounds Diarrhea    GI UPSET    Current Outpatient Prescriptions  Medication Sig Dispense Refill  . ALPRAZolam (XANAX) 0.5 MG tablet Take 1 tablet by mouth as needed.  4  . bimatoprost (LUMIGAN) 0.03 % ophthalmic solution Place 1 drop into both eyes at bedtime.    . clindamycin  (CLINDAGEL) 1 % gel Apply topically 2 (two) times daily. 30 g 0  . lidocaine-prilocaine (EMLA) cream APP EXT AA 1 TIME  3  . liothyronine (CYTOMEL) 5 MCG tablet Take 5 mcg by mouth 2 (two) times daily.    Marland Kitchen LORazepam (ATIVAN) 0.5 MG tablet TK 1 T PO  HS  0  . LUMIGAN 0.01 % SOLN Place 1 drop into both eyes at bedtime.  0  . acetaminophen (TYLENOL) 325 MG tablet Take 650 mg by mouth every 6 (six) hours as needed.    . gabapentin (NEURONTIN) 300 MG capsule Take 1 capsule (300 mg total) by mouth at bedtime. 30 capsule 6  . metoCLOPramide (REGLAN) 5 MG tablet Take 1 tablet (5 mg total) by mouth every 6 (six) hours as needed for nausea. (Patient not taking: Reported on 05/28/2015) 60 tablet 2  . omeprazole (PRILOSEC) 40 MG capsule Take 1 capsule (40 mg total) by mouth daily. 30 capsule 3  . ondansetron (ZOFRAN) 8 MG tablet   1  . sucralfate (CARAFATE) 1 GM/10ML suspension Take 10 mLs (1 g total) by mouth 4 (four) times daily -  with meals and at bedtime. (Patient not taking: Reported on  04/23/2015) 420 mL 0  . SUMAtriptan (IMITREX) 50 MG tablet TAKE 1 TABLET BY MOUTH AT THE ONSET OF HEADACHE. TAKE NO MORE THAN 4 TABLETS BY MOUTH IN 24 HOURS (Patient not taking: Reported on 04/23/2015) 9 tablet 5  . triamcinolone cream (KENALOG) 0.1 % Apply 1 application topically 2 (two) times daily. (Patient not taking: Reported on 05/14/2015) 45 g 0  . valACYclovir (VALTREX) 500 MG tablet Take 1 tablet (500 mg total) by mouth daily. (Patient not taking: Reported on 05/28/2015) 30 tablet 3   No current facility-administered medications for this visit.   Facility-Administered Medications Ordered in Other Visits  Medication Dose Route Frequency Provider Last Rate Last Dose  . sodium chloride 0.9 % injection 10 mL  10 mL Intracatheter PRN Chauncey Cruel, MD   10 mL at 04/16/15 1209    OBJECTIVE: Middle-aged white woman in no acute distress Filed Vitals:   06/04/15 0934  BP: 139/81  Pulse: 94  Temp: 98.3 F (36.8 C)    Resp: 18     Body mass index is 24.85 kg/(m^2).    ECOG FS:1 - Symptomatic but completely ambulatory  Skin: warm, dry  HEENT: sclerae anicteric, conjunctivae pink, oropharynx clear. No thrush or mucositis.  Lymph Nodes: No cervical or supraclavicular lymphadenopathy  Lungs: clear to auscultation bilaterally, no rales, wheezes, or rhonci  Heart: regular rate and rhythm  Abdomen: round, soft, non tender, positive bowel sounds  Musculoskeletal: No focal spinal tenderness, no peripheral edema  Neuro: non focal, well oriented, positive affect  Breast: deferred  LAB RESULTS:  CMP     Component Value Date/Time   NA 141 06/04/2015 0918   NA 140 01/21/2009 0828   K 4.5 06/04/2015 0918   K 4.1 01/21/2009 0828   CL 108 01/21/2009 0828   CO2 29 06/04/2015 0918   CO2 30 01/21/2009 0828   GLUCOSE 90 06/04/2015 0918   GLUCOSE 90 01/21/2009 0828   BUN 11.9 06/04/2015 0918   BUN 13 01/21/2009 0828   CREATININE 0.7 06/04/2015 0918   CREATININE 0.7 01/21/2009 0828   CALCIUM 9.5 06/04/2015 0918   CALCIUM 8.5 01/21/2009 0828   PROT 6.9 06/04/2015 0918   PROT 6.9 01/21/2009 0828   ALBUMIN 4.0 06/04/2015 0918   ALBUMIN 3.7 01/21/2009 0828   AST 35* 06/04/2015 0918   AST 22 01/21/2009 0828   ALT 57* 06/04/2015 0918   ALT 19 01/21/2009 0828   ALKPHOS 71 06/04/2015 0918   ALKPHOS 43 01/21/2009 0828   BILITOT 0.45 06/04/2015 0918   BILITOT 1.1 01/21/2009 0828   GFRNONAA 95.01 01/21/2009 0828    INo results found for: SPEP, UPEP  Lab Results  Component Value Date   WBC 3.6* 06/04/2015   NEUTROABS 2.3 06/04/2015   HGB 11.3* 06/04/2015   HCT 33.6* 06/04/2015   MCV 98.1 06/04/2015   PLT 288 06/04/2015      Chemistry      Component Value Date/Time   NA 141 06/04/2015 0918   NA 140 01/21/2009 0828   K 4.5 06/04/2015 0918   K 4.1 01/21/2009 0828   CL 108 01/21/2009 0828   CO2 29 06/04/2015 0918   CO2 30 01/21/2009 0828   BUN 11.9 06/04/2015 0918   BUN 13 01/21/2009 0828    CREATININE 0.7 06/04/2015 0918   CREATININE 0.7 01/21/2009 0828      Component Value Date/Time   CALCIUM 9.5 06/04/2015 0918   CALCIUM 8.5 01/21/2009 0828   ALKPHOS 71 06/04/2015 0918  ALKPHOS 43 01/21/2009 0828   AST 35* 06/04/2015 0918   AST 22 01/21/2009 0828   ALT 57* 06/04/2015 0918   ALT 19 01/21/2009 0828   BILITOT 0.45 06/04/2015 0918   BILITOT 1.1 01/21/2009 0828       No results found for: LABCA2  No components found for: QNVVY721  No results for input(s): INR in the last 168 hours.  Urinalysis    Component Value Date/Time   COLORURINE yellow 10/05/2009 1454   APPEARANCEUR Clear 10/05/2009 1454   LABSPEC 1.010 10/05/2009 1454   PHURINE 7.0 10/05/2009 1454   HGBUR 2+ 10/05/2009 1454   BILIRUBINUR neg 04/13/2015 1605   BILIRUBINUR negative 10/05/2009 1454   PROTEINUR neg 04/13/2015 1605   UROBILINOGEN negative 04/13/2015 1605   UROBILINOGEN 0.2 10/05/2009 1454   NITRITE neg 04/13/2015 1605   NITRITE negative 10/05/2009 1454   LEUKOCYTESUR Negative 04/13/2015 1605    STUDIES: No results found.  ASSESSMENT: 54 y.o. [place] woman status post right breast biopsy 12/26/2014 for a clinical T1 cN0, stage IA invasive ductal carcinoma, grade 1 or 2, estrogen and progesterone receptor positive, with an MIB-1 of 11%, and HER-2 amplified, with a signals ratio of 2.07, number per cell 2.28.  (1) status post right lumpectomy and sentinel lymph node sampling 01/23/2015 for a pT1c pN1, stage IIA invasive ductal carcinoma, grade 1, with focally positive anterior margins  (a) additional surgery for margin clearance 02/16/2015 followed residual disease  (2) chemotherapy starting 03/05/2015, to consist of doxorubicin and cyclophosphamide in dose dense fashion 4, to be followed by paclitaxel weekly,  with trastuzumab and pertuzumab given every 3 weeks   (3) trastuzumab will be continued to complete 1 year  (a) echocardiogram 04/28/2015 shows a normal ejection  fraction  (4) adjuvant radiation to follow chemotherapy  (5) anti-estrogens to follow radiation  PLAN: Aivy neuropathy symptoms have unfortunately progressed. I consulted with Dr. Jana Hakim and he agrees we should stop treatment with paclitaxel at this point. He would like for her to finish out the pertuzumab if possible as she only has 2 cycles left. At that point she will continue on trastuzumab alone for 1 calendar year.   Bonnie Ward is already on a vitamin B complex. I am adding 340m gabapentin QHS to help quiet her neuropathy symptoms at night with the added benefit of improving her hot flashes during this time as well  Bonnie Ward will return in 2 weeks for her next cycle of trastuzumab and pertuzumab. She understands and agrees with this plan. She knows the goal of treatment in he case is cure. She has been encouraged to call with any issues that might arise before her next visit here.   HLaurie Panda NP   06/04/2015 10:05 AM

## 2015-06-05 ENCOUNTER — Telehealth: Payer: Self-pay | Admitting: Oncology

## 2015-06-05 NOTE — Telephone Encounter (Signed)
s.w. pt and confirmed cx appt adn nxt visit....pt ok and aware

## 2015-06-09 ENCOUNTER — Telehealth: Payer: Self-pay | Admitting: *Deleted

## 2015-06-09 NOTE — Telephone Encounter (Signed)
Left vm for pt stating she may have her teeth cleaned 2 wks after last chemo. She will be eligible for a regular cleaning the week of Oct 24th.

## 2015-06-11 ENCOUNTER — Ambulatory Visit: Payer: BLUE CROSS/BLUE SHIELD

## 2015-06-11 ENCOUNTER — Other Ambulatory Visit: Payer: BLUE CROSS/BLUE SHIELD

## 2015-06-12 ENCOUNTER — Other Ambulatory Visit: Payer: Self-pay | Admitting: Nurse Practitioner

## 2015-06-12 DIAGNOSIS — C50411 Malignant neoplasm of upper-outer quadrant of right female breast: Secondary | ICD-10-CM

## 2015-06-18 ENCOUNTER — Other Ambulatory Visit (HOSPITAL_BASED_OUTPATIENT_CLINIC_OR_DEPARTMENT_OTHER): Payer: BLUE CROSS/BLUE SHIELD

## 2015-06-18 ENCOUNTER — Ambulatory Visit (HOSPITAL_BASED_OUTPATIENT_CLINIC_OR_DEPARTMENT_OTHER): Payer: BLUE CROSS/BLUE SHIELD

## 2015-06-18 ENCOUNTER — Ambulatory Visit (HOSPITAL_BASED_OUTPATIENT_CLINIC_OR_DEPARTMENT_OTHER): Payer: BLUE CROSS/BLUE SHIELD | Admitting: Nurse Practitioner

## 2015-06-18 ENCOUNTER — Telehealth: Payer: Self-pay | Admitting: Nurse Practitioner

## 2015-06-18 ENCOUNTER — Encounter: Payer: Self-pay | Admitting: Nurse Practitioner

## 2015-06-18 VITALS — BP 136/77 | HR 90 | Temp 97.8°F | Resp 18 | Ht 66.5 in | Wt 156.2 lb

## 2015-06-18 DIAGNOSIS — T451X5A Adverse effect of antineoplastic and immunosuppressive drugs, initial encounter: Secondary | ICD-10-CM

## 2015-06-18 DIAGNOSIS — C50411 Malignant neoplasm of upper-outer quadrant of right female breast: Secondary | ICD-10-CM

## 2015-06-18 DIAGNOSIS — Z170 Estrogen receptor positive status [ER+]: Secondary | ICD-10-CM

## 2015-06-18 DIAGNOSIS — Z5112 Encounter for antineoplastic immunotherapy: Secondary | ICD-10-CM

## 2015-06-18 DIAGNOSIS — G620 Drug-induced polyneuropathy: Secondary | ICD-10-CM

## 2015-06-18 LAB — COMPREHENSIVE METABOLIC PANEL (CC13)
ALT: 33 U/L (ref 0–55)
ANION GAP: 7 meq/L (ref 3–11)
AST: 29 U/L (ref 5–34)
Albumin: 3.7 g/dL (ref 3.5–5.0)
Alkaline Phosphatase: 69 U/L (ref 40–150)
BUN: 14.3 mg/dL (ref 7.0–26.0)
CALCIUM: 9.4 mg/dL (ref 8.4–10.4)
CHLORIDE: 106 meq/L (ref 98–109)
CO2: 26 mEq/L (ref 22–29)
CREATININE: 0.8 mg/dL (ref 0.6–1.1)
EGFR: >90 mL/min/{1.73_m2} (ref >90)
Glucose: 117 mg/dl (ref 70–140)
POTASSIUM: 4 meq/L (ref 3.5–5.1)
Sodium: 139 mEq/L (ref 136–145)
Total Bilirubin: 0.33 mg/dL (ref 0.20–1.20)
Total Protein: 6.6 g/dL (ref 6.4–8.3)

## 2015-06-18 LAB — CBC WITH DIFFERENTIAL/PLATELET
BASO%: 0.5 % (ref 0.0–2.0)
BASOS ABS: 0 10*3/uL (ref 0.0–0.1)
EOS%: 2.9 % (ref 0.0–7.0)
Eosinophils Absolute: 0.1 10*3/uL (ref 0.0–0.5)
HEMATOCRIT: 34.5 % — AB (ref 34.8–46.6)
HGB: 11.5 g/dL — ABNORMAL LOW (ref 11.6–15.9)
LYMPH#: 0.9 10*3/uL (ref 0.9–3.3)
LYMPH%: 19.6 % (ref 14.0–49.7)
MCH: 32.4 pg (ref 25.1–34.0)
MCHC: 33.2 g/dL (ref 31.5–36.0)
MCV: 97.5 fL (ref 79.5–101.0)
MONO#: 0.5 10*3/uL (ref 0.1–0.9)
MONO%: 10 % (ref 0.0–14.0)
NEUT#: 3.1 10*3/uL (ref 1.5–6.5)
NEUT%: 67 % (ref 38.4–76.8)
Platelets: 298 10*3/uL (ref 145–400)
RBC: 3.54 10*6/uL — AB (ref 3.70–5.45)
RDW: 16.4 % — ABNORMAL HIGH (ref 11.2–14.5)
WBC: 4.6 10*3/uL (ref 3.9–10.3)

## 2015-06-18 MED ORDER — DIPHENHYDRAMINE HCL 25 MG PO CAPS
25.0000 mg | ORAL_CAPSULE | Freq: Once | ORAL | Status: AC
  Administered 2015-06-18: 25 mg via ORAL

## 2015-06-18 MED ORDER — SODIUM CHLORIDE 0.9 % IV SOLN
Freq: Once | INTRAVENOUS | Status: AC
  Administered 2015-06-18: 13:00:00 via INTRAVENOUS

## 2015-06-18 MED ORDER — HEPARIN SOD (PORK) LOCK FLUSH 100 UNIT/ML IV SOLN
500.0000 [IU] | Freq: Once | INTRAVENOUS | Status: AC | PRN
  Administered 2015-06-18: 500 [IU]
  Filled 2015-06-18: qty 5

## 2015-06-18 MED ORDER — SODIUM CHLORIDE 0.9 % IJ SOLN
10.0000 mL | INTRAMUSCULAR | Status: DC | PRN
  Administered 2015-06-18: 10 mL
  Filled 2015-06-18: qty 10

## 2015-06-18 MED ORDER — TRASTUZUMAB CHEMO INJECTION 440 MG
6.0000 mg/kg | Freq: Once | INTRAVENOUS | Status: AC
  Administered 2015-06-18: 420 mg via INTRAVENOUS
  Filled 2015-06-18: qty 20

## 2015-06-18 MED ORDER — DIPHENHYDRAMINE HCL 25 MG PO CAPS
ORAL_CAPSULE | ORAL | Status: AC
  Filled 2015-06-18: qty 1

## 2015-06-18 MED ORDER — ACETAMINOPHEN 325 MG PO TABS
650.0000 mg | ORAL_TABLET | Freq: Once | ORAL | Status: AC
  Administered 2015-06-18: 650 mg via ORAL

## 2015-06-18 MED ORDER — ACETAMINOPHEN 325 MG PO TABS
ORAL_TABLET | ORAL | Status: AC
  Filled 2015-06-18: qty 2

## 2015-06-18 NOTE — Patient Instructions (Signed)
[facility] Discharge Instructions for Patients Receiving Chemotherapy  Today you received the following chemotherapy agents: Herceptin.  To help prevent nausea and vomiting after your treatment, we encourage you to take your nausea medication: Zofran 8 mg every 8 hours as needed.   If you develop nausea and vomiting that is not controlled by your nausea medication, call the clinic.   BELOW ARE SYMPTOMS THAT SHOULD BE REPORTED IMMEDIATELY:  *FEVER GREATER THAN 100.5 F  *CHILLS WITH OR WITHOUT FEVER  NAUSEA AND VOMITING THAT IS NOT CONTROLLED WITH YOUR NAUSEA MEDICATION  *UNUSUAL SHORTNESS OF BREATH  *UNUSUAL BRUISING OR BLEEDING  TENDERNESS IN MOUTH AND THROAT WITH OR WITHOUT PRESENCE OF ULCERS  *URINARY PROBLEMS  *BOWEL PROBLEMS  UNUSUAL RASH Items with * indicate a potential emergency and should be followed up as soon as possible.  Feel free to call the clinic you have any questions or concerns. The clinic phone number is [phone].  Please show the CHEMO ALERT CARD at check-in to the Emergency Department and triage nurse.   

## 2015-06-18 NOTE — Telephone Encounter (Signed)
pof noted and patient has appointments in place for next lab and chemo

## 2015-06-18 NOTE — Progress Notes (Signed)
Bonnie Ward  [phone]) [phone] Fax:(336) [phone]     ID: Bonnie Ward DOB: May 24, 1961  MR#: [id number]  ESP#:[id number]  Patient Care Team: Bonnie Sails, MD as PCP - General (Internal Medicine) Milford Cage, OTR Kem Boroughs, FNP as Nurse Practitioner (Nurse Practitioner) Fanny Skates, MD as Consulting Physician (General Surgery) Chauncey Cruel, MD as Consulting Physician (Oncology) Thea Silversmith, MD as Consulting Physician (Radiation Oncology) Rockwell Germany, RN as Registered Nurse Mauro Kaufmann, RN as Registered Nurse Holley Bouche, NP as Nurse Practitioner (Nurse Practitioner) Kristeen Miss, MD as Consulting Physician (Neurosurgery) PCP: Bonnie Sails, MD OTHER MD:  CHIEF COMPLAINT: Estrogen receptor positive breast cancer  CURRENT TREATMENT: adjuvant chemotherapy   BREAST CANCER HISTORY: From the initial intake note:   [place] had routine bilateral screening mammography at the [facility] for 20 01/09/2015 showing a possible mass in the right breast. On 12/22/2014 she underwent diagnostic right mammography with tomosynthesis and right breast ultrasonography. The breast density was category C. In the right breast there was an area of increased density with architectural distortion and faint microcalcifications in the upper outer quadrant. There was mild palpable soft tissue thickening in the area in question, but no palpable right axillary lymph nodes. Ultrasound confirmed a 1.7 cm irregular hypoechoic mass. There were no abnormal-appearing right axillary lymph nodes.  Biopsy of the right breast mass in question 12/26/2014 showed ([facility] 16-08/08/2005) an invasive ductal carcinoma, grade 1 or 2, with a prognostic panel still pending.  Her subsequent history is as detailed below  INTERVAL HISTORY: Bonnie Ward returns today for follow-up of her breast cancer. Today she is due for cycle 3 of trastuzumab and pertuzumab. Paclitaxel was  stopped 2 weeks ago because of progressive neuropathy. The burning to her feet is better since starting on gabapentin, but she still has residual numbness to both sides L>R. A detailed review of systems is otherwise stable.  REVIEW OF SYSTEMS: Bonnie Ward denies fevers, chills, nausea, vomiting, or changes in bowel or bladder habits. She has no mouth sores. The rash to her chest and arms is clearing up. She is having more joint aches than usual to her hips, shoulders, and arms. She has not taken anything for this. It started up after she stopped the paclitaxel. She denies shortness of breath, chest pain, cough, or palpitations. A detailed review of systems is otherwise stable.  PAST MEDICAL HISTORY: Past Medical History  Diagnosis Date  . ACID REFLUX DISEASE 07/12/2010  . Arachnoid cyst     left frontal lobe  . Migraines   . Arthritis     hands  . Nervous stomach   . Difficulty swallowing pills   . Hypothyroidism   . Stress fracture of foot 12/2014    right  . Glaucoma   . Breast cancer ([place]) 12/2014    right  . Anxiety   . Dental crowns present   . Seasonal allergies   . Complication of anesthesia 2012    states woke up during colonoscopy with Propofol    PAST SURGICAL HISTORY: Past Surgical History  Procedure Laterality Date  . Tonsillectomy  age 13  . Esophagogastroduodenoscopy endoscopy    . Tooth extraction    . Lasik    . Colonoscopy with propofol  10/14/2013  . Radioactive seed guided mastectomy with axillary sentinel lymph node biopsy Right 01/23/2015    Procedure: RIGHT PARTIAL MASTECTOMY WITH RADIOACTIVE SEED LOCALIZATION  WITH RIGHT AXILLARY SENTINEL LYMPH NODE BIOPSY;  Surgeon: Fanny Skates, MD;  Location: [facility];  Service: General;  Laterality: Right;  . Portacath placement Left 01/23/2015    Procedure: INSERTION PORT-A-CATH ;  Surgeon: Fanny Skates, MD;  Location: [facility];  Service: General;  Laterality: Left;  . Re-excision of breast  lumpectomy Right 02/16/2015    Procedure: RE-EXCISION OF RIGHT  BREAST LUMPECTOMY MARGINS;  Surgeon: Fanny Skates, MD;  Location: [facility];  Service: General;  Laterality: Right;    FAMILY HISTORY Family History  Problem Relation Age of Onset  . Osteoporosis Mother   . Irritable bowel syndrome Mother   . Hypertension Mother   . Hypertension Father   . Hyperlipidemia Father   . Diabetes Father   . Dementia Father   . Colon cancer Maternal Grandfather 52  . Breast cancer Paternal Aunt   . Breast cancer Paternal Grandmother    the patient's father died at age 54 from pneumonia. [name] died at age 28 after bowel perforation. The patient had one brother, one sister. There is no history of breast or ovarian cancer in the family.  GYNECOLOGIC HISTORY:  Patient's last menstrual period was 01/25/2011. Menarche age 89, the patient is GX P0. She stopped having periods approximately 2011. She is still on hormone replacement, but is "trickling off it". She did take birth control pills for approximately 3 years remotely with no complications  SOCIAL HISTORY:  Bonnie Ward works as a Haematologist. She is divorced and lives by herself, with 2 dogs.    ADVANCED DIRECTIVES: The patient's brother Bonnie Ward is her healthcare power of attorney. He may be reached in [place] at [facility]: Social History  Substance Use Topics  . Smoking status: Former Smoker -- 0.00 packs/day for 0 years    Quit date: 02/19/2005  . Smokeless tobacco: Never Used  . Alcohol Use: Yes     Comment: occasionally     Colonoscopy: February 2016  PAP: June 2015  Bone density: October 2015  Lipid panel:  Allergies  Allergen Reactions  . Lortab [Hydrocodone-Acetaminophen] Anxiety  . Milk-Related Compounds Diarrhea    GI UPSET    Current Outpatient Prescriptions  Medication Sig Dispense Refill  . ALPRAZolam (XANAX) 0.5 MG tablet Take 1 tablet by mouth as needed.  4   . bimatoprost (LUMIGAN) 0.03 % ophthalmic solution Place 1 drop into both eyes at bedtime.    . gabapentin (NEURONTIN) 300 MG capsule Take 1 capsule (300 mg total) by mouth at bedtime. 30 capsule 6  . lidocaine-prilocaine (EMLA) cream APP EXT AA 1 TIME  3  . liothyronine (CYTOMEL) 5 MCG tablet Take 5 mcg by mouth 2 (two) times daily.    Marland Kitchen LUMIGAN 0.01 % SOLN Place 1 drop into both eyes at bedtime.  0  . omeprazole (PRILOSEC) 40 MG capsule Take 1 capsule (40 mg total) by mouth daily. 30 capsule 3  . acetaminophen (TYLENOL) 325 MG tablet Take 650 mg by mouth every 6 (six) hours as needed.    . clindamycin (CLINDAGEL) 1 % gel Apply topically 2 (two) times daily. 30 g 0  . LORazepam (ATIVAN) 0.5 MG tablet TK 1 T PO  HS  0  . metoCLOPramide (REGLAN) 5 MG tablet Take 1 tablet (5 mg total) by mouth every 6 (six) hours as needed for nausea. (Patient not taking: Reported on 05/28/2015) 60 tablet 2  . ondansetron (ZOFRAN) 8 MG tablet   1  . sucralfate (CARAFATE) 1 GM/10ML suspension Take 10 mLs (1 g total)  by mouth 4 (four) times daily -  with meals and at bedtime. (Patient not taking: Reported on 04/23/2015) 420 mL 0  . SUMAtriptan (IMITREX) 50 MG tablet TAKE 1 TABLET BY MOUTH AT THE ONSET OF HEADACHE. TAKE NO MORE THAN 4 TABLETS BY MOUTH IN 24 HOURS (Patient not taking: Reported on 04/23/2015) 9 tablet 5  . triamcinolone cream (KENALOG) 0.1 % Apply 1 application topically 2 (two) times daily. (Patient not taking: Reported on 05/14/2015) 45 g 0  . valACYclovir (VALTREX) 500 MG tablet Take 1 tablet (500 mg total) by mouth daily. (Patient not taking: Reported on 05/28/2015) 30 tablet 3   No current facility-administered medications for this visit.   Facility-Administered Medications Ordered in Other Visits  Medication Dose Route Frequency Provider Last Rate Last Dose  . sodium chloride 0.9 % injection 10 mL  10 mL Intracatheter PRN Chauncey Cruel, MD   10 mL at 04/16/15 1209    OBJECTIVE: Middle-aged white  woman in no acute distress Filed Vitals:   06/18/15 1040  BP: 136/77  Pulse: 90  Temp: 97.8 F (36.6 C)  Resp: 18     Body mass index is 24.83 kg/(m^2).    ECOG FS:1 - Symptomatic but completely ambulatory  Skin: warm, dry, erythematous papular rash to chest, clearing HEENT: sclerae anicteric, conjunctivae pink, oropharynx clear. No thrush or mucositis.  Lymph Nodes: No cervical or supraclavicular lymphadenopathy  Lungs: clear to auscultation bilaterally, no rales, wheezes, or rhonci  Heart: regular rate and rhythm  Abdomen: round, soft, non tender, positive bowel sounds  Musculoskeletal: No focal spinal tenderness, no peripheral edema  Neuro: non focal, well oriented, positive affect  Breast: deferred  LAB RESULTS:  CMP     Component Value Date/Time   NA 141 06/04/2015 0918   NA 140 01/21/2009 0828   K 4.5 06/04/2015 0918   K 4.1 01/21/2009 0828   CL 108 01/21/2009 0828   CO2 29 06/04/2015 0918   CO2 30 01/21/2009 0828   GLUCOSE 90 06/04/2015 0918   GLUCOSE 90 01/21/2009 0828   BUN 11.9 06/04/2015 0918   BUN 13 01/21/2009 0828   CREATININE 0.7 06/04/2015 0918   CREATININE 0.7 01/21/2009 0828   CALCIUM 9.5 06/04/2015 0918   CALCIUM 8.5 01/21/2009 0828   PROT 6.9 06/04/2015 0918   PROT 6.9 01/21/2009 0828   ALBUMIN 4.0 06/04/2015 0918   ALBUMIN 3.7 01/21/2009 0828   AST 35* 06/04/2015 0918   AST 22 01/21/2009 0828   ALT 57* 06/04/2015 0918   ALT 19 01/21/2009 0828   ALKPHOS 71 06/04/2015 0918   ALKPHOS 43 01/21/2009 0828   BILITOT 0.45 06/04/2015 0918   BILITOT 1.1 01/21/2009 0828   GFRNONAA 95.01 01/21/2009 0828    INo results found for: SPEP, UPEP  Lab Results  Component Value Date   WBC 4.6 06/18/2015   NEUTROABS 3.1 06/18/2015   HGB 11.5* 06/18/2015   HCT 34.5* 06/18/2015   MCV 97.5 06/18/2015   PLT 298 06/18/2015      Chemistry      Component Value Date/Time   NA 141 06/04/2015 0918   NA 140 01/21/2009 0828   K 4.5 06/04/2015 0918   K 4.1  01/21/2009 0828   CL 108 01/21/2009 0828   CO2 29 06/04/2015 0918   CO2 30 01/21/2009 0828   BUN 11.9 06/04/2015 0918   BUN 13 01/21/2009 0828   CREATININE 0.7 06/04/2015 0918   CREATININE 0.7 01/21/2009 6270  Component Value Date/Time   CALCIUM 9.5 06/04/2015 0918   CALCIUM 8.5 01/21/2009 0828   ALKPHOS 71 06/04/2015 0918   ALKPHOS 43 01/21/2009 0828   AST 35* 06/04/2015 0918   AST 22 01/21/2009 0828   ALT 57* 06/04/2015 0918   ALT 19 01/21/2009 0828   BILITOT 0.45 06/04/2015 0918   BILITOT 1.1 01/21/2009 0828       No results found for: LABCA2  No components found for: LABCA125  No results for input(s): INR in the last 168 hours.  Urinalysis    Component Value Date/Time   COLORURINE yellow 10/05/2009 1454   APPEARANCEUR Clear 10/05/2009 1454   LABSPEC 1.010 10/05/2009 1454   PHURINE 7.0 10/05/2009 1454   HGBUR 2+ 10/05/2009 1454   BILIRUBINUR neg 04/13/2015 1605   BILIRUBINUR negative 10/05/2009 1454   PROTEINUR neg 04/13/2015 1605   UROBILINOGEN negative 04/13/2015 1605   UROBILINOGEN 0.2 10/05/2009 1454   NITRITE neg 04/13/2015 1605   NITRITE negative 10/05/2009 1454   LEUKOCYTESUR Negative 04/13/2015 1605    STUDIES: No results found.  ASSESSMENT: 54 y.o. [place] woman status post right breast biopsy 12/26/2014 for a clinical T1 cN0, stage IA invasive ductal carcinoma, grade 1 or 2, estrogen and progesterone receptor positive, with an MIB-1 of 11%, and HER-2 amplified, with a signals ratio of 2.07, number per cell 2.28.  (1) status post right lumpectomy and sentinel lymph node sampling 01/23/2015 for a pT1c pN1, stage IIA invasive ductal carcinoma, grade 1, with focally positive anterior margins  (a) additional surgery for margin clearance 02/16/2015 followed residual disease  (2) chemotherapy starting 03/05/2015, to consist of doxorubicin and cyclophosphamide in dose dense fashion 4, to be followed by paclitaxel weekly,  with trastuzumab and  pertuzumab given every 3 weeks. Paclitaxel stopped after 4 cycles because of progressive neuropathy.  (3) trastuzumab will be continued to complete 1 year  (a) echocardiogram 04/28/2015 shows a normal ejection fraction  (4) adjuvant radiation to follow chemotherapy  (5) anti-estrogens to follow radiation  PLAN: Bonnie Ward is feeling well overall today. The labs were reviewed in detail and were entirely stable. She will proceed with cycle 3 of trastuzumab and pertuzumab as planned. She will receive one more dose of the pertuzumab before continuing on trastuzumab alone to complete 1 year of anti-HER-2 therapy.  Bonnie Ward's joint pain seems like something she dealt with chronically before chemo started, but is now resurfacing because she is no longer on steroids. I suggested she try NSAIDs PRN to ease this transition. She will continue on the gabapentin for her residual neuropathy.  Bonnie Ward will return in 1 week for follow up with Dr. Jana Hakim. She will meet with Dr. Pablo Ledger just following this visit for her radiation consultation visit. She understands and agrees with this plan. She knows the goal of treatment in he case is cure. She has been encouraged to call with any issues that might arise before her next visit here.   Laurie Panda, NP   06/18/2015 11:16 AM

## 2015-06-23 ENCOUNTER — Encounter: Payer: Self-pay | Admitting: Radiation Oncology

## 2015-06-23 NOTE — Progress Notes (Signed)
Location of Breast Cancer: Right Breast Cancer, 10 o'clock position, Upper Outer Quadrant   Histology per Pathology Report: Diagnosis 12/26/2014: Breast, right, needle core biopsy, upper outer quadrant, 10 o'clock 6 cm from nipple- INVASIVE DUCTAL CARCINOMA.  Receptor Status: ER(100%+  ), PR ( 90%+  ), Her2-neu ( +  )  Did patient present with symptoms (if so, please note symptoms) or was this found on screening mammography?: Routine mammogram  Past/Anticipated interventions by surgeon, if any: Diagnosis 02/16/2015: Dr. Renelda Loma Ingram,MD Breast, excision, Re-excision of anterior margin - MAMMARY CARCINOMA PRESENT SEE COMMENT- PREVIOUS SURGICAL SITE TISSUE CHANGES.- SURGICAL MARGIN, NEGATIVE FOR ATYPIA OR MALIGNANCY.  Diagnosis: 6/3/216: 1. Breast, lumpectomy, right - INVASIVE GRADE I DUCTAL CARCINOMA, MEASURING AT LEAST 1.6 CM IN GREATEST DIMENSION.- INTERMEDIATE GRADE DUCAL CARCINOMA IN SITU. - ASSOCIATED CALCIFICATIONS.- DUCTAL CARCINOMA IN SITU IS CLOSE (LESS THAN 0.2 CM) TO POSTERIOR MARGIN.- INVASIVE DUCTAL CARCINOMA IS CLOSE (LESS THAN 0.1 CM) TO POSTERIOR MARGIN. - INVASIVE DUCTAL CARCINOMA IS EXTREMELY CLOSE (LESS THAN 0.01 CM ) TO ANTERIOR MARGIN.- OTHER MARGINS NEGATIVE (SPECIMEN 2 AND SPECIMEN 3 CONTAIN TRUE LATERAL AND SUPERIOR MARGINS).- SEE ONCOLOGY TEMPLATE AND COMMENT. 2. Breast, excision, re-excision lateral margin - BENIGN FIBROFATTY SOFT TISSUE.- FOCAL BENIGN MUSCLE.- NO ATYPIA OR TUMOR SEEN. 3. Breast, excision, re-excision superior margin - BENIGN BREAST PARENCHYMA WITH FIBROCYSTIC CHANGES.- NO ATYPIA OR TUMOR SEEN. 4. Lymph node, sentinel, biopsy, Right axillary #1 - ONE BENIGN LYMPH NODE WITH NO TUMOR SEEN (0/1). 5. Lymph node, sentinel, biopsy, Right axillary #2 - ONE LYMPH NODE POSITIVE FOR METASTATIC DUCTAL CARCINOMA (1/1). 6. Lymph node, sentinel, biopsy, Right axillary #3 - ONE BENIGN LYMPH NODE WITH NO TUMOR SEEN (0/1).  Past/Anticipated interventions by  medical oncology, if any: Chemotherapy :Dr. Jana Hakim appt 06/25/15 @ 9am  2) chemotherapy starting 03/05/2015, to consist of doxorubicin and cyclophosphamide in dose dense fashion 4, to be followed by paclitaxel weekly, with trastuzumab and pertuzumab given every 3 weeks. Paclitaxel stopped after 4 cycles because of progressive neuropathy.  (3) trastuzumab will be continued to complete 1 year (a) echocardiogram 04/28/2015 shows a normal ejection fraction (4) adjuvant radiation to follow chemotherapy (5) anti-estrogens to follow radiation   Lymphedema issues, if any: Right foot occasionally  Pain issues, if any:  5/10 left ankle due to tripping this morning when walking the dogs.  She reports dull joint pain.    SAFETY ISSUES:  Prior radiation? NO  Pacemaker/ICD?  NO  Possible current pregnancy? NO  Is the patient on methotrexate?  NO  Current Complaints / other details: Divorced, lives alone, Menarche age 72, 4, menses stopped 2011; still on HRT ,Birth control pills 3 years  Hx Anxiety Maternal grandmother colon cancer, Paternal Grandmother and Paternal Aunt=Breast cancer, no hx breast or ovarian cancer in family parents deceased,     Rebecca Eaton, RN 06/23/2015,2:10 PM

## 2015-06-24 ENCOUNTER — Ambulatory Visit: Payer: BLUE CROSS/BLUE SHIELD | Admitting: Radiation Oncology

## 2015-06-24 ENCOUNTER — Ambulatory Visit: Payer: BLUE CROSS/BLUE SHIELD

## 2015-06-25 ENCOUNTER — Ambulatory Visit: Payer: BLUE CROSS/BLUE SHIELD

## 2015-06-25 ENCOUNTER — Other Ambulatory Visit (HOSPITAL_BASED_OUTPATIENT_CLINIC_OR_DEPARTMENT_OTHER): Payer: BLUE CROSS/BLUE SHIELD

## 2015-06-25 ENCOUNTER — Ambulatory Visit (HOSPITAL_BASED_OUTPATIENT_CLINIC_OR_DEPARTMENT_OTHER): Payer: BLUE CROSS/BLUE SHIELD | Admitting: Oncology

## 2015-06-25 ENCOUNTER — Ambulatory Visit
Admission: RE | Admit: 2015-06-25 | Discharge: 2015-06-25 | Disposition: A | Discharge status: Home / Self Care | Payer: BLUE CROSS/BLUE SHIELD | Source: Ambulatory Visit | Attending: Radiation Oncology | Admitting: Radiation Oncology

## 2015-06-25 ENCOUNTER — Encounter: Payer: Self-pay | Admitting: Radiation Oncology

## 2015-06-25 ENCOUNTER — Telehealth: Payer: Self-pay | Admitting: Oncology

## 2015-06-25 VITALS — BP 125/75 | HR 78 | Temp 97.8°F | Resp 18 | Ht 66.5 in | Wt 155.9 lb

## 2015-06-25 VITALS — BP 133/65 | HR 70 | Temp 97.5°F | Resp 10 | Wt 156.1 lb

## 2015-06-25 DIAGNOSIS — Z170 Estrogen receptor positive status [ER+]: Secondary | ICD-10-CM | POA: Insufficient documentation

## 2015-06-25 DIAGNOSIS — C50411 Malignant neoplasm of upper-outer quadrant of right female breast: Secondary | ICD-10-CM

## 2015-06-25 DIAGNOSIS — G620 Drug-induced polyneuropathy: Secondary | ICD-10-CM

## 2015-06-25 DIAGNOSIS — Z510 Encounter for antineoplastic radiation therapy: Secondary | ICD-10-CM | POA: Diagnosis present

## 2015-06-25 DIAGNOSIS — Z9221 Personal history of antineoplastic chemotherapy: Secondary | ICD-10-CM | POA: Insufficient documentation

## 2015-06-25 LAB — CBC WITH DIFFERENTIAL/PLATELET
BASO%: 0.6 % (ref 0.0–2.0)
Basophils Absolute: 0 10*3/uL (ref 0.0–0.1)
EOS ABS: 0.2 10*3/uL (ref 0.0–0.5)
EOS%: 3.6 % (ref 0.0–7.0)
HCT: 36.8 % (ref 34.8–46.6)
HEMOGLOBIN: 12.1 g/dL (ref 11.6–15.9)
LYMPH%: 18.3 % (ref 14.0–49.7)
MCH: 32.1 pg (ref 25.1–34.0)
MCHC: 32.9 g/dL (ref 31.5–36.0)
MCV: 97.4 fL (ref 79.5–101.0)
MONO#: 0.5 10*3/uL (ref 0.1–0.9)
MONO%: 10.5 % (ref 0.0–14.0)
NEUT%: 67 % (ref 38.4–76.8)
NEUTROS ABS: 3.2 10*3/uL (ref 1.5–6.5)
Platelets: 284 10*3/uL (ref 145–400)
RBC: 3.78 10*6/uL (ref 3.70–5.45)
RDW: 15.5 % — AB (ref 11.2–14.5)
WBC: 4.7 10*3/uL (ref 3.9–10.3)
lymph#: 0.9 10*3/uL (ref 0.9–3.3)

## 2015-06-25 LAB — COMPREHENSIVE METABOLIC PANEL (CC13)
ALBUMIN: 3.8 g/dL (ref 3.5–5.0)
ALK PHOS: 73 U/L (ref 40–150)
ALT: 39 U/L (ref 0–55)
AST: 32 U/L (ref 5–34)
Anion Gap: 6 mEq/L (ref 3–11)
BILIRUBIN TOTAL: 0.41 mg/dL (ref 0.20–1.20)
BUN: 10.8 mg/dL (ref 7.0–26.0)
CO2: 29 mEq/L (ref 22–29)
CREATININE: 0.7 mg/dL (ref 0.6–1.1)
Calcium: 9.7 mg/dL (ref 8.4–10.4)
Chloride: 106 mEq/L (ref 98–109)
EGFR: >90 mL/min/{1.73_m2} (ref >90)
GLUCOSE: 92 mg/dL (ref 70–140)
Potassium: 4.4 mEq/L (ref 3.5–5.1)
SODIUM: 141 meq/L (ref 136–145)
TOTAL PROTEIN: 6.8 g/dL (ref 6.4–8.3)

## 2015-06-25 MED ORDER — KETOCONAZOLE 2 % EX CREA: 1.0000 "application " | TOPICAL_CREAM | Freq: Every day | CUTANEOUS | Status: DC

## 2015-06-25 NOTE — Telephone Encounter (Signed)
Appointments made per pof and patient will get a new avs at rad onc and 11/17,mt chart

## 2015-06-25 NOTE — Progress Notes (Signed)
[place]  [phone] [phone]     ID: Bonnie Ward DOB: 1960/12/25  MR#: [id number]  KZL#:[id number]  Patient Care Team: Lollie Sails, MD as PCP - General (Internal Medicine) Milford Cage, OTR Kem Boroughs, FNP as Nurse Practitioner (Nurse Practitioner) Fanny Skates, MD as Consulting Physician (General Surgery) Chauncey Cruel, MD as Consulting Physician (Oncology) Thea Silversmith, MD as Consulting Physician (Radiation Oncology) Rockwell Germany, RN as Registered Nurse Mauro Kaufmann, RN as Registered Nurse Holley Bouche, NP as Nurse Practitioner (Nurse Practitioner) Kristeen Miss, MD as Consulting Physician (Neurosurgery) PCP: Lollie Sails, MD OTHER MD:  CHIEF COMPLAINT: Estrogen receptor positive breast cancer  CURRENT TREATMENT: trastuzumab; adjuvant radiation pending   BREAST CANCER HISTORY: From the initial intake note:   Bonnie Ward had routine bilateral screening mammography at the [facility] for [facility] 01/09/2015 showing a possible mass in the right breast. On 12/22/2014 she underwent diagnostic right mammography with tomosynthesis and right breast ultrasonography. The breast density was category C. In the right breast there was an area of increased density with architectural distortion and faint microcalcifications in the upper outer quadrant. There was mild palpable soft tissue thickening in the area in question, but no palpable right axillary lymph nodes. Ultrasound confirmed a 1.7 cm irregular hypoechoic mass. There were no abnormal-appearing right axillary lymph nodes.  Biopsy of the right breast mass in question 12/26/2014 showed (SAA 16-08/08/2005) an invasive ductal carcinoma, grade 1 or 2, with a prognostic panel still pending.  Her subsequent history is as detailed below  INTERVAL HISTORY: Bonnie Ward returns today for follow-up of her breast cancer. She continues on trastuzumab alone, which she receives every  3 weeks. She is tolerating this well, with no side effects that she is aware of. Her port is working well also. Since she completed her chemotherapy she has been feeling  REVIEW OF SYSTEMS: Bonnie Ward has had more energy since stopping the chemotherapy. She still has peripheral neuropathy however. It affects her feet particularly in the morning when she gets out of bed. It feels like bad plantar fasciitis. She also has some tingling during the day at times. This morning she felt more walking her dogs and twisted her left ankle. She iced it and is feeling a little bit better she has lost her eyebrows and eyelashes which is something that does happen late, usually after completion of chemotherapy, as in her case. She's has a little bit of a rash between her breasts and on the anterior thighs bilaterally. She has some seasonal allergy problems. Her arthritis pains in her hands, right elbow, and hips, is worse. Her eyes are little bit on the dry side. Her hair is just beginning to come in. Aside from these issues a detailed review of systems today was stable  PAST MEDICAL HISTORY: Past Medical History  Diagnosis Date  . ACID REFLUX DISEASE 07/12/2010  . Arachnoid cyst     left frontal lobe  . Migraines   . Arthritis     hands  . Nervous stomach   . Difficulty swallowing pills   . Hypothyroidism   . Stress fracture of foot 12/2014    right  . Glaucoma   . Anxiety   . Dental crowns present   . Seasonal allergies   . Complication of anesthesia 2012    states woke up during colonoscopy with Propofol  . Breast cancer ([facility]) 12/2014    right    PAST SURGICAL HISTORY: Past Surgical History  Procedure Laterality  Date  . Tonsillectomy  age 45  . Esophagogastroduodenoscopy endoscopy    . Tooth extraction    . Lasik    . Colonoscopy with propofol  10/14/2013  . Radioactive seed guided mastectomy with axillary sentinel lymph node biopsy Right 01/23/2015    Procedure: RIGHT PARTIAL MASTECTOMY WITH  RADIOACTIVE SEED LOCALIZATION  WITH RIGHT AXILLARY SENTINEL LYMPH NODE BIOPSY;  Surgeon: Fanny Skates, MD;  Location: [facility];  Service: General;  Laterality: Right;  . Portacath placement Left 01/23/2015    Procedure: INSERTION PORT-A-CATH ;  Surgeon: Fanny Skates, MD;  Location: [facility];  Service: General;  Laterality: Left;  . Re-excision of breast lumpectomy Right 02/16/2015    Procedure: RE-EXCISION OF RIGHT  BREAST LUMPECTOMY MARGINS;  Surgeon: Fanny Skates, MD;  Location: [facility];  Service: General;  Laterality: Right;    FAMILY HISTORY Family History  Problem Relation Age of Onset  . Osteoporosis Mother   . Irritable bowel syndrome Mother   . Hypertension Mother   . Hypertension Father   . Hyperlipidemia Father   . Diabetes Father   . Dementia Father   . Colon cancer Maternal Grandfather 46  . Breast cancer Paternal Aunt   . Breast cancer Paternal Grandmother    the patient's father died at age 34 from pneumonia. [name] died at age 60 after bowel perforation. The patient had one brother, one sister. There is no history of breast or ovarian cancer in the family.  GYNECOLOGIC HISTORY:  Patient's last menstrual period was 01/25/2011. Menarche age 27, the patient is GX P0. She stopped having periods approximately 2011. She is still on hormone replacement, but is "trickling off it". She did take birth control pills for approximately 3 years remotely with no complications  SOCIAL HISTORY:  Bonnie Ward works as a Haematologist. She is divorced and lives by herself, with 2 dogs.    ADVANCED DIRECTIVES: The patient's brother [name] is her healthcare power of attorney. He may be reached in [place] at [place]: Social History  Substance Use Topics  . Smoking status: Former Smoker -- 0.00 packs/day for 0 years    Quit date: 02/19/2005  . Smokeless tobacco: Never Used  . Alcohol Use: Yes      Comment: occasionally     Colonoscopy: February 2016  PAP: June 2015  Bone density: October 2015  Lipid panel:  Allergies  Allergen Reactions  . Lortab [Hydrocodone-Acetaminophen] Anxiety  . Milk-Related Compounds Diarrhea    GI UPSET    Current Outpatient Prescriptions  Medication Sig Dispense Refill  . acetaminophen (TYLENOL) 325 MG tablet Take 650 mg by mouth every 6 (six) hours as needed.    . bimatoprost (LUMIGAN) 0.03 % ophthalmic solution Place 1 drop into both eyes at bedtime.    . lidocaine-prilocaine (EMLA) cream APP EXT AA 1 TIME  3  . liothyronine (CYTOMEL) 5 MCG tablet Take 5 mcg by mouth 2 (two) times daily.    Marland Kitchen LORazepam (ATIVAN) 0.5 MG tablet TK 1 T PO  HS  0  . LUMIGAN 0.01 % SOLN Place 1 drop into both eyes at bedtime.  0  . omeprazole (PRILOSEC) 40 MG capsule Take 1 capsule (40 mg total) by mouth daily. 30 capsule 3   No current facility-administered medications for this visit.    OBJECTIVE: Middle-aged white woman who appears stated age 13 Vitals:   06/25/15 0847  BP: 125/75  Pulse: 78  Temp: 97.8  F (36.6 C)  Resp: 18     Body mass index is 24.79 kg/(m^2).    ECOG FS:1 - Symptomatic but completely ambulatory  Sclerae unicteric, pupils round and equal Oropharynx clear and moist-- no thrush or other lesions No cervical or supraclavicular adenopathy Lungs no rales or rhonchi Heart regular rate and rhythm Abd soft, nontender, positive bowel sounds MSK no focal spinal tenderness, no upper extremity lymphedema Neuro: nonfocal, well oriented, appropriate affect Breasts: The right breast is status post lumpectomy. There is no evidence of local recurrence. The cosmetic result is excellent. The right axilla is benign. The left breast is unremarkable Skin: She has an acneform rash in the mid anterior chest, between the breast, and also in the epigastric area. This is scattered and consistent with a candidal rash.  LAB RESULTS:  CMP     Component  Value Date/Time   NA 139 06/18/2015 1033   NA 140 01/21/2009 0828   K 4.0 06/18/2015 1033   K 4.1 01/21/2009 0828   CL 108 01/21/2009 0828   CO2 26 06/18/2015 1033   CO2 30 01/21/2009 0828   GLUCOSE 117 06/18/2015 1033   GLUCOSE 90 01/21/2009 0828   BUN 14.3 06/18/2015 1033   BUN 13 01/21/2009 0828   CREATININE 0.8 06/18/2015 1033   CREATININE 0.7 01/21/2009 0828   CALCIUM 9.4 06/18/2015 1033   CALCIUM 8.5 01/21/2009 0828   PROT 6.6 06/18/2015 1033   PROT 6.9 01/21/2009 0828   ALBUMIN 3.7 06/18/2015 1033   ALBUMIN 3.7 01/21/2009 0828   AST 29 06/18/2015 1033   AST 22 01/21/2009 0828   ALT 33 06/18/2015 1033   ALT 19 01/21/2009 0828   ALKPHOS 69 06/18/2015 1033   ALKPHOS 43 01/21/2009 0828   BILITOT 0.33 06/18/2015 1033   BILITOT 1.1 01/21/2009 0828   GFRNONAA 95.01 01/21/2009 0828    INo results found for: SPEP, UPEP  Lab Results  Component Value Date   WBC 4.7 06/25/2015   NEUTROABS 3.2 06/25/2015   HGB 12.1 06/25/2015   HCT 36.8 06/25/2015   MCV 97.4 06/25/2015   PLT 284 06/25/2015      Chemistry      Component Value Date/Time   NA 139 06/18/2015 1033   NA 140 01/21/2009 0828   K 4.0 06/18/2015 1033   K 4.1 01/21/2009 0828   CL 108 01/21/2009 0828   CO2 26 06/18/2015 1033   CO2 30 01/21/2009 0828   BUN 14.3 06/18/2015 1033   BUN 13 01/21/2009 0828   CREATININE 0.8 06/18/2015 1033   CREATININE 0.7 01/21/2009 0828      Component Value Date/Time   CALCIUM 9.4 06/18/2015 1033   CALCIUM 8.5 01/21/2009 0828   ALKPHOS 69 06/18/2015 1033   ALKPHOS 43 01/21/2009 0828   AST 29 06/18/2015 1033   AST 22 01/21/2009 0828   ALT 33 06/18/2015 1033   ALT 19 01/21/2009 0828   BILITOT 0.33 06/18/2015 1033   BILITOT 1.1 01/21/2009 0828       No results found for: LABCA2  No components found for: LABCA125  No results for input(s): INR in the last 168 hours.  Urinalysis    Component Value Date/Time   COLORURINE yellow 10/05/2009 1454   APPEARANCEUR Clear  10/05/2009 1454   LABSPEC 1.010 10/05/2009 1454   PHURINE 7.0 10/05/2009 1454   HGBUR 2+ 10/05/2009 1454   BILIRUBINUR neg 04/13/2015 1605   BILIRUBINUR negative 10/05/2009 1454   PROTEINUR neg 04/13/2015 1605   UROBILINOGEN  negative 04/13/2015 1605   UROBILINOGEN 0.2 10/05/2009 1454   NITRITE neg 04/13/2015 1605   NITRITE negative 10/05/2009 1454   LEUKOCYTESUR Negative 04/13/2015 1605    STUDIES: No results found.  ASSESSMENT: 54 y.o. [place] woman status post right breast biopsy 12/26/2014 for a clinical T1 cN0, stage IA invasive ductal carcinoma, grade 1 or 2, estrogen and progesterone receptor positive, with an MIB-1 of 11%, and HER-2 amplified, with a signals ratio of 2.07, number per cell 2.28.  (1) status post right lumpectomy and sentinel lymph node sampling 01/23/2015 for a pT1c pN1, stage IIA invasive ductal carcinoma, grade 1, with focally positive anterior margins  (a) additional surgery 02/16/2015 found residual disease but cleared margins  (2) chemotherapy started 03/05/2015, consisting of doxorubicin and cyclophosphamide in dose dense fashion 4, completed 04/16/2015, followed by paclitaxel weekly x 12 planned cycles, started 04/30/2015,  with trastuzumab and pertuzumab given every 3 weeks.  (a) Paclitaxel stopped after 4 cycles because of progressive neuropathy, last dose 05/28/2015  (b) pertuzumab stopped after 2 cycles (when paclitaxel stopped)  (3) trastuzumab will be continued to complete 1 year (through August 2017)  (a) echocardiogram 04/28/2015 shows a normal ejection fraction  (4) adjuvant radiation to follow chemotherapy  (5) anti-estrogens to follow radiation  PLAN: Hala is recovering from the chemotherapy, although she still has neuropathy and she just lost her eyelashes, which she understands is a late development from chemotherapy. These symptoms and problems hopefully will continue to resolve.   As far as her arthritis symptoms are concerned I  suggested she do to achieve like exercises for range of motion. She is likely to have dry skin this winter also. In general chemotherapy "ages" the patient but only temporarily. In a few months she'll be pretty much back to baseline.  As far as her rash is concerned I wrote her a prescription for ketoconazole cream which I think will clear it.  She will continue her every 3 week trastuzumab alone (no pertuzumab) 07/09/2015. She will see [place] again December 8 and before that visit she will have an echocardiogram. She will see me again in January and at that point we will discuss anti-estrogens.  She is hoping to be able to complete her radiation treatments before the end of the year for insurance reasons.  Chauncey Cruel, MD   06/25/2015 9:04 AM

## 2015-06-25 NOTE — Progress Notes (Signed)
Department of Radiation Oncology  Phone:  [phone] Fax:        [phone]   Name: Bonnie Ward MRN: [id number]  DOB: 07-12-1961  Date: 06/25/2015  Follow Up Visit Note  Diagnosis: Breast cancer of upper-outer quadrant of right female breast [facility])   Staging form: Breast, AJCC 7th Edition     Clinical stage from 01/07/2015: Stage IA (T1c, N0, M0) - Unsigned       Staging comments: Staged at breast conference on 5.18.16  Diagnosis: T1cN1 invasive ductal carcinoma of the right female breast  Summary and Interval since last radiation: No previous radiation  Interval History: Bonnie Ward presents today for routine follow-up appointment with radiation oncology. She underwent lumpectomy and sentinel lymph node biopsy on 01/23/2015. She was found to have a 1.6cm invasive ductal carcinoma with multiple close margins. She also had 1 out of 3 nodes positive for metastatic disease. This was a macromet with no extra capillare extention. The tumor was ER/PR positive and HER-2 positive. She had re-excission of margins on 02/16/2015, which cleared the margins. She then underwent adjuvant chemotherapy. Her last dose was 06/18/2015. She is now ready to begin radiation. Her chemotherapy course was complicated by an increase in her arthritis, as well as neuropathy. The neuropathy has responded to the medicaiton Gabapentin.   She reports symptoms of left ankle pain, caused this morning when she tripped walking her dogs. "My feet are numb, staying the same, so we had to stop," the patient reported about past medication. "I sleep really well, but my feet are not getting any better," the patient stated in regards to taking Gabapentin. She did not express interest in pursuing physical therapy to address this issue of concern. Other symptoms reported include fatigue and joint pain. The patient projected a healthy mental status and was not accompanied by family today. She has continued to work throughout treatment,  only missing two days.  Physical Exam: The patient was alert and oriented x3. There is no significant changes to the status of overall health to be noted at this time. Filed Vitals:   06/25/15 1007  BP: 133/65  Pulse: 70  Temp: 97.5 F (36.4 C)  TempSrc: Oral  Resp: 10  Weight: 156 lb 1.6 oz (70.806 kg)  SpO2: 100%    IMPRESSION: Bonnie Ward is a 54 y.o. female presenting to clinic in regards to her T1cN1 invasive ductal carcinoma of the right female breast.    PLAN: I spoke to the patient today regarding her diagnosis and options for treatment. We discussed the equivalence in terms of survival and local failure between mastectomy and breast conservation. We discussed the role of radiation in decreasing local failures in patients who undergo lumpectomy. We discussed the process of simulation and the placement tattoos. We discussed 6 weeks of treatment as an outpatient. We discussed the possibility of asymptomatic lung damage. We discussed the low likelihood of secondary malignancies. We discussed the possible side effects including but not limited to skin redness, fatigue, permanent skin darkening, and breast swelling.    Her CT simulation planning session is scheduled for Thursday (07/02/2015) at 9:00am. All vocalized questions and concerns have been addressed. If the patient develops any further questions or concerns in regards to her treatment and recovery, she has been encouraged to contact Dr. Pablo Ledger, MD.  I spent 40 minutes face to face with the patient and more than 50% of that time was spent in counseling and/or coordination of care.   This document serves as a  record of services personally performed by Thea Silversmith , MD. It was created on her behalf by Lenn Cal, a trained medical scribe. The creation of this record is based on the scribe's personal observations and the provider's statements to them. This document has been checked and approved by the attending provider.    ------------------------------------------------  Thea Silversmith, MD

## 2015-06-25 NOTE — Addendum Note (Signed)
Encounter addended by: Jenene Slicker, RN on: 06/25/2015  5:11 PM<BR>     Documentation filed: Charges VN

## 2015-06-30 ENCOUNTER — Ambulatory Visit
Admission: RE | Admit: 2015-06-30 | Discharge: 2015-06-30 | Disposition: A | Discharge status: Home / Self Care | Payer: BLUE CROSS/BLUE SHIELD | Source: Ambulatory Visit | Attending: Radiation Oncology | Admitting: Radiation Oncology

## 2015-06-30 DIAGNOSIS — C50411 Malignant neoplasm of upper-outer quadrant of right female breast: Secondary | ICD-10-CM

## 2015-06-30 DIAGNOSIS — Z510 Encounter for antineoplastic radiation therapy: Secondary | ICD-10-CM | POA: Diagnosis not present

## 2015-06-30 NOTE — Progress Notes (Signed)
Name: Bonnie Ward   MRN: [id number]  Date:  06/30/2015  DOB: 09-25-1960  Status:outpatient    DIAGNOSIS: Breast cancer.  CONSENT VERIFIED: yes   SET UP: Patient is setup supine   IMMOBILIZATION:  The following immobilization was used:Custom Moldable Pillow, breast board.   NARRATIVE: [name] was brought to the [facility].  Identity was confirmed.  All relevant records and images related to the planned course of therapy were reviewed.  Then, the patient was positioned in a stable reproducible clinical set-up for radiation therapy.  Wires were placed to delineate the clinical extent of breast tissue. A wire was placed on the scar as well.  CT images were obtained.  An isocenter was placed. Skin markings were placed.  The CT images were loaded into the planning software where the target and avoidance structures were contoured.  The radiation prescription was entered and confirmed. The patient was discharged in stable condition and tolerated simulation well.    TREATMENT PLANNING NOTE:  Treatment planning then occurred. I have requested : MLC's, isodose plan, basic dose calculation  I personally designed and supervised the construction of 5 medically necessary complex treatment devices for the protection of critical normal structures including the lungs and contralateral breast as well as the immobilization device which is necessary for set up certainty.   TREATMENT PLANNING NOTE/3D Simulation Note Treatment planning then occurred. I have requested : MLC's, isodose plan, basic dose calculation  3D simulation was performed.  I personally constructed 6 complex treatment devices in the form of MLCs which will be used for beam modification and to protect critical structures including the heart and lung.  I have requested a dose volume histogram of the heart lung and tumor cavity.

## 2015-06-30 NOTE — Progress Notes (Signed)
Radiation Oncology         [phone] ________________________________  Name: Bonnie Ward      MRN: [id number]          Date: 06/30/2015              DOB: 10-Oct-1960  Optical Surface Tracking Plan:  Since intensity modulated radiotherapy (IMRT) and 3D conformal radiation treatment methods are predicated on accurate and precise positioning for treatment, intrafraction motion monitoring is medically necessary to ensure accurate and safe treatment delivery.  The ability to quantify intrafraction motion without excessive ionizing radiation dose can only be performed with optical surface tracking. Accordingly, surface imaging offers the opportunity to obtain 3D measurements of patient position throughout IMRT and 3D treatments without excessive radiation exposure.  I am ordering optical surface tracking for this patient's upcoming course of radiotherapy. ________________________________ Signature   Reference:   Ursula Alert, J, et al. Surface imaging-based analysis of intrafraction motion for breast radiotherapy patients.Journal of [place], n. 6, nov. 2014. ISSN [id number].   Available at: <http://www.jacmp.org/index.php/jacmp/article/view/4957>.

## 2015-07-02 ENCOUNTER — Other Ambulatory Visit: Payer: BLUE CROSS/BLUE SHIELD

## 2015-07-02 ENCOUNTER — Ambulatory Visit: Payer: BLUE CROSS/BLUE SHIELD

## 2015-07-03 DIAGNOSIS — Z510 Encounter for antineoplastic radiation therapy: Secondary | ICD-10-CM | POA: Diagnosis not present

## 2015-07-06 ENCOUNTER — Ambulatory Visit
Admission: RE | Admit: 2015-07-06 | Discharge: 2015-07-06 | Disposition: A | Discharge status: Home / Self Care | Payer: BLUE CROSS/BLUE SHIELD | Source: Ambulatory Visit | Attending: Radiation Oncology | Admitting: Radiation Oncology

## 2015-07-06 DIAGNOSIS — Z510 Encounter for antineoplastic radiation therapy: Secondary | ICD-10-CM | POA: Diagnosis not present

## 2015-07-07 ENCOUNTER — Ambulatory Visit
Admission: RE | Admit: 2015-07-07 | Discharge: 2015-07-07 | Disposition: A | Discharge status: Home / Self Care | Payer: BLUE CROSS/BLUE SHIELD | Source: Ambulatory Visit | Attending: Radiation Oncology | Admitting: Radiation Oncology

## 2015-07-07 ENCOUNTER — Encounter: Payer: Self-pay | Admitting: Radiation Oncology

## 2015-07-07 ENCOUNTER — Ambulatory Visit: Payer: BLUE CROSS/BLUE SHIELD

## 2015-07-07 VITALS — BP 119/77 | HR 77 | Temp 97.4°F | Ht 66.5 in | Wt 155.4 lb

## 2015-07-07 DIAGNOSIS — C50411 Malignant neoplasm of upper-outer quadrant of right female breast: Secondary | ICD-10-CM

## 2015-07-07 DIAGNOSIS — Z510 Encounter for antineoplastic radiation therapy: Secondary | ICD-10-CM | POA: Diagnosis not present

## 2015-07-07 MED ORDER — ALRA NON-METALLIC DEODORANT (RAD-ONC)
1.0000 "application " | Freq: Once | TOPICAL | Status: AC
  Administered 2015-07-07: 1 via TOPICAL

## 2015-07-07 MED ORDER — RADIAPLEXRX EX GEL
Freq: Once | CUTANEOUS | Status: AC
  Administered 2015-07-07: 18:00:00 via TOPICAL

## 2015-07-07 NOTE — Progress Notes (Signed)
Weekly Management Note Current Dose:  3.6 Gy  Projected Dose: 61 Gy   Narrative:  The patient presents for routine under treatment assessment.  CBCT/MVCT images/Port film x-rays were reviewed.  The chart was checked. Rash continues. Worse since starti antifungal. Worse at night. RN teaching performed.   Physical Findings: Weight: 155 lb 6.4 oz (70.489 kg). Red rash over chest bilateral arms, back.   Impression:  The patient is tolerating radiation.  Plan:  Continue treatment as planned. Benadryl if needed. Start radiaplex.

## 2015-07-07 NOTE — Progress Notes (Signed)
Bonnie Ward is here for her 2nd fraction of radiation to her Right Breast. She denies fatigue at this time. Her Right breast is normal in appearance, with her incisions slightly red, but no signs of infection. She does have a rash to her upper trunk and arms which she states is related to chemotherapy and she is using an antifungal cream which was prescribed for her. She has no other complaints at this time.   BP 119/77 mmHg  Pulse 77  Temp(Src) 97.4 F (36.3 C)  Ht 5' 6.5" (1.689 m)  Wt 155 lb 6.4 oz (70.489 kg)  BMI 24.71 kg/m2  LMP 01/25/2011

## 2015-07-07 NOTE — Addendum Note (Signed)
Encounter addended by: Ernst Spell, RN on: 07/07/2015  6:13 PM<BR>     Documentation filed: Inpatient Patient Education, Notes Section

## 2015-07-07 NOTE — Progress Notes (Signed)
Pt here for patient teaching.  Pt given Radiation and You booklet, skin care instructions, Alra deodorant and Radiaplex gel. Pt reports they have not watched the Radiation Therapy video, but were given the link. Reviewed areas of pertinence such as fatigue, skin changes, breast tenderness, breast swelling, cough, shortness of breath, earaches and taste changes . Pt able to give teach back of to pat skin, use unscented/gentle soap and drink plenty of water,apply Radiaplex bid, avoid applying anything to skin within 4 hours of treatment, avoid wearing an under wire bra and to use an electric razor if they must shave. Pt verbalizes understanding of information given and will contact nursing with any questions or concerns.

## 2015-07-07 NOTE — Addendum Note (Signed)
Encounter addended by: Ernst Spell, RN on: 07/07/2015  6:10 PM<BR>     Documentation filed: Inpatient MAR

## 2015-07-08 ENCOUNTER — Ambulatory Visit: Payer: BLUE CROSS/BLUE SHIELD

## 2015-07-08 ENCOUNTER — Ambulatory Visit
Admission: RE | Admit: 2015-07-08 | Discharge: 2015-07-08 | Disposition: A | Discharge status: Home / Self Care | Payer: BLUE CROSS/BLUE SHIELD | Source: Ambulatory Visit | Attending: Radiation Oncology | Admitting: Radiation Oncology

## 2015-07-08 DIAGNOSIS — Z510 Encounter for antineoplastic radiation therapy: Secondary | ICD-10-CM | POA: Diagnosis not present

## 2015-07-09 ENCOUNTER — Ambulatory Visit (HOSPITAL_BASED_OUTPATIENT_CLINIC_OR_DEPARTMENT_OTHER): Payer: BLUE CROSS/BLUE SHIELD

## 2015-07-09 ENCOUNTER — Other Ambulatory Visit: Payer: Self-pay | Admitting: Oncology

## 2015-07-09 ENCOUNTER — Ambulatory Visit: Payer: BLUE CROSS/BLUE SHIELD | Admitting: Nurse Practitioner

## 2015-07-09 ENCOUNTER — Encounter: Payer: Self-pay | Admitting: *Deleted

## 2015-07-09 ENCOUNTER — Other Ambulatory Visit: Payer: BLUE CROSS/BLUE SHIELD

## 2015-07-09 ENCOUNTER — Ambulatory Visit
Admission: RE | Admit: 2015-07-09 | Discharge: 2015-07-09 | Disposition: A | Discharge status: Home / Self Care | Payer: BLUE CROSS/BLUE SHIELD | Source: Ambulatory Visit | Attending: Radiation Oncology | Admitting: Radiation Oncology

## 2015-07-09 ENCOUNTER — Other Ambulatory Visit (HOSPITAL_BASED_OUTPATIENT_CLINIC_OR_DEPARTMENT_OTHER): Payer: BLUE CROSS/BLUE SHIELD

## 2015-07-09 VITALS — BP 138/77 | HR 78 | Temp 97.8°F | Resp 17

## 2015-07-09 DIAGNOSIS — C50411 Malignant neoplasm of upper-outer quadrant of right female breast: Secondary | ICD-10-CM

## 2015-07-09 DIAGNOSIS — Z5112 Encounter for antineoplastic immunotherapy: Secondary | ICD-10-CM

## 2015-07-09 DIAGNOSIS — Z510 Encounter for antineoplastic radiation therapy: Secondary | ICD-10-CM | POA: Diagnosis not present

## 2015-07-09 LAB — COMPREHENSIVE METABOLIC PANEL (CC13)
ALBUMIN: 3.8 g/dL (ref 3.5–5.0)
ALK PHOS: 69 U/L (ref 40–150)
ALT: 34 U/L (ref 0–55)
AST: 33 U/L (ref 5–34)
Anion Gap: 7 mEq/L (ref 3–11)
BUN: 13 mg/dL (ref 7.0–26.0)
CALCIUM: 9.5 mg/dL (ref 8.4–10.4)
CHLORIDE: 104 meq/L (ref 98–109)
CO2: 29 mEq/L (ref 22–29)
Creatinine: 0.8 mg/dL (ref 0.6–1.1)
EGFR: 89 mL/min/{1.73_m2} — AB (ref >90)
Glucose: 82 mg/dl (ref 70–140)
POTASSIUM: 4.2 meq/L (ref 3.5–5.1)
Sodium: 140 mEq/L (ref 136–145)
Total Bilirubin: 0.41 mg/dL (ref 0.20–1.20)
Total Protein: 6.9 g/dL (ref 6.4–8.3)

## 2015-07-09 LAB — CBC WITH DIFFERENTIAL/PLATELET
BASO%: 0.5 % (ref 0.0–2.0)
BASOS ABS: 0 10*3/uL (ref 0.0–0.1)
EOS ABS: 0.1 10*3/uL (ref 0.0–0.5)
EOS%: 3 % (ref 0.0–7.0)
HEMATOCRIT: 38.9 % (ref 34.8–46.6)
HEMOGLOBIN: 12.8 g/dL (ref 11.6–15.9)
LYMPH%: 19.9 % (ref 14.0–49.7)
MCH: 31.6 pg (ref 25.1–34.0)
MCHC: 32.8 g/dL (ref 31.5–36.0)
MCV: 96.2 fL (ref 79.5–101.0)
MONO#: 0.5 10*3/uL (ref 0.1–0.9)
MONO%: 11.1 % (ref 0.0–14.0)
NEUT#: 2.8 10*3/uL (ref 1.5–6.5)
NEUT%: 65.5 % (ref 38.4–76.8)
Platelets: 286 10*3/uL (ref 145–400)
RBC: 4.04 10*6/uL (ref 3.70–5.45)
RDW: 14.5 % (ref 11.2–14.5)
WBC: 4.2 10*3/uL (ref 3.9–10.3)
lymph#: 0.8 10*3/uL — ABNORMAL LOW (ref 0.9–3.3)

## 2015-07-09 MED ORDER — SODIUM CHLORIDE 0.9 % IJ SOLN
10.0000 mL | INTRAMUSCULAR | Status: DC | PRN
  Administered 2015-07-09: 10 mL
  Filled 2015-07-09: qty 10

## 2015-07-09 MED ORDER — ACETAMINOPHEN 325 MG PO TABS
650.0000 mg | ORAL_TABLET | Freq: Once | ORAL | Status: AC
  Administered 2015-07-09: 650 mg via ORAL

## 2015-07-09 MED ORDER — HEPARIN SOD (PORK) LOCK FLUSH 100 UNIT/ML IV SOLN
500.0000 [IU] | Freq: Once | INTRAVENOUS | Status: AC | PRN
  Administered 2015-07-09: 500 [IU]
  Filled 2015-07-09: qty 5

## 2015-07-09 MED ORDER — DIPHENHYDRAMINE HCL 25 MG PO CAPS: 25.0000 mg | ORAL_CAPSULE | Freq: Once | ORAL | Status: DC

## 2015-07-09 MED ORDER — TRASTUZUMAB CHEMO INJECTION 440 MG
6.0000 mg/kg | Freq: Once | INTRAVENOUS | Status: AC
  Administered 2015-07-09: 420 mg via INTRAVENOUS
  Filled 2015-07-09: qty 20

## 2015-07-09 MED ORDER — ACETAMINOPHEN 325 MG PO TABS
ORAL_TABLET | ORAL | Status: AC
  Filled 2015-07-09: qty 2

## 2015-07-09 MED ORDER — SODIUM CHLORIDE 0.9 % IV SOLN
Freq: Once | INTRAVENOUS | Status: AC
  Administered 2015-07-09: 10:00:00 via INTRAVENOUS

## 2015-07-09 NOTE — Progress Notes (Signed)
Clarified orders with Dr. Jana Hakim, pt is to receive Herceptin only at this time. Pt states she would like to try Herceptin infusion without taking the Benadryl tablet today. Dr. Jana Hakim aware and no new orders at this time.

## 2015-07-09 NOTE — Patient Instructions (Signed)
[facility] Discharge Instructions for Patients  Today you received the following: Herceptin   To help prevent nausea and vomiting after your treatment, we encourage you to take your nausea medication as directed.   If you develop nausea and vomiting that is not controlled by your nausea medication, call the clinic.   BELOW ARE SYMPTOMS THAT SHOULD BE REPORTED IMMEDIATELY:  *FEVER GREATER THAN 100.5 F  *CHILLS WITH OR WITHOUT FEVER  NAUSEA AND VOMITING THAT IS NOT CONTROLLED WITH YOUR NAUSEA MEDICATION  *UNUSUAL SHORTNESS OF BREATH  *UNUSUAL BRUISING OR BLEEDING  TENDERNESS IN MOUTH AND THROAT WITH OR WITHOUT PRESENCE OF ULCERS  *URINARY PROBLEMS  *BOWEL PROBLEMS  UNUSUAL RASH Items with * indicate a potential emergency and should be followed up as soon as possible.  Feel free to call the clinic you have any questions or concerns. The clinic phone number is [phone].  Please show the CHEMO ALERT CARD at check-in to the Emergency Department and triage nurse.   

## 2015-07-10 ENCOUNTER — Ambulatory Visit
Admission: RE | Admit: 2015-07-10 | Discharge: 2015-07-10 | Disposition: A | Discharge status: Home / Self Care | Payer: BLUE CROSS/BLUE SHIELD | Source: Ambulatory Visit | Attending: Radiation Oncology | Admitting: Radiation Oncology

## 2015-07-10 DIAGNOSIS — Z510 Encounter for antineoplastic radiation therapy: Secondary | ICD-10-CM | POA: Diagnosis not present

## 2015-07-12 ENCOUNTER — Ambulatory Visit
Admission: RE | Admit: 2015-07-12 | Discharge: 2015-07-12 | Disposition: A | Discharge status: Home / Self Care | Payer: BLUE CROSS/BLUE SHIELD | Source: Ambulatory Visit | Attending: Radiation Oncology | Admitting: Radiation Oncology

## 2015-07-12 DIAGNOSIS — Z510 Encounter for antineoplastic radiation therapy: Secondary | ICD-10-CM | POA: Diagnosis not present

## 2015-07-12 NOTE — Progress Notes (Signed)
Weekly Management Note Current Dose: 14.4 Gy  Projected Dose: 61 Gy   Narrative:  The patient presents for routine under treatment assessment.  CBCT/MVCT images/Port film x-rays were reviewed.  The chart was checked. Doing well. No complaints.   Physical Findings: Weight:  . Red rash over chest bilateral arms, back.   Impression:  The patient is tolerating radiation.  Plan:  Continue treatment as planned. Continue radiaplex.  ------------------------------------------------  Thea Silversmith, MD 00.

## 2015-07-13 ENCOUNTER — Ambulatory Visit
Admission: RE | Admit: 2015-07-13 | Discharge: 2015-07-13 | Disposition: A | Discharge status: Home / Self Care | Payer: BLUE CROSS/BLUE SHIELD | Source: Ambulatory Visit | Attending: Radiation Oncology | Admitting: Radiation Oncology

## 2015-07-13 DIAGNOSIS — Z510 Encounter for antineoplastic radiation therapy: Secondary | ICD-10-CM | POA: Diagnosis not present

## 2015-07-14 ENCOUNTER — Ambulatory Visit
Admission: RE | Admit: 2015-07-14 | Discharge: 2015-07-14 | Disposition: A | Discharge status: Home / Self Care | Payer: BLUE CROSS/BLUE SHIELD | Source: Ambulatory Visit | Attending: Radiation Oncology | Admitting: Radiation Oncology

## 2015-07-14 ENCOUNTER — Encounter: Payer: Self-pay | Admitting: Radiation Oncology

## 2015-07-14 VITALS — BP 115/79 | HR 84 | Resp 16 | Wt 156.9 lb

## 2015-07-14 DIAGNOSIS — Z510 Encounter for antineoplastic radiation therapy: Secondary | ICD-10-CM | POA: Diagnosis not present

## 2015-07-14 DIAGNOSIS — C50411 Malignant neoplasm of upper-outer quadrant of right female breast: Secondary | ICD-10-CM

## 2015-07-14 NOTE — Progress Notes (Signed)
Weight and vitals stable. Denies pain. No skin changes noted within treatment field. Reports using radiaplex as directed. Denies fatigue. Without complaints.   BP 115/79 mmHg  Pulse 84  Resp 16  Wt 156 lb 14.4 oz (71.169 kg)  SpO2 100%  LMP 01/25/2011 Wt Readings from Last 3 Encounters:  07/14/15 156 lb 14.4 oz (71.169 kg)  07/07/15 155 lb 6.4 oz (70.489 kg)  06/25/15 156 lb 1.6 oz (70.806 kg)

## 2015-07-15 ENCOUNTER — Ambulatory Visit
Admission: RE | Admit: 2015-07-15 | Discharge: 2015-07-15 | Disposition: A | Discharge status: Home / Self Care | Payer: BLUE CROSS/BLUE SHIELD | Source: Ambulatory Visit | Attending: Radiation Oncology | Admitting: Radiation Oncology

## 2015-07-15 DIAGNOSIS — Z510 Encounter for antineoplastic radiation therapy: Secondary | ICD-10-CM | POA: Diagnosis not present

## 2015-07-17 ENCOUNTER — Ambulatory Visit: Payer: BLUE CROSS/BLUE SHIELD

## 2015-07-17 ENCOUNTER — Other Ambulatory Visit: Payer: BLUE CROSS/BLUE SHIELD

## 2015-07-17 ENCOUNTER — Ambulatory Visit: Payer: BLUE CROSS/BLUE SHIELD | Admitting: Nurse Practitioner

## 2015-07-18 ENCOUNTER — Ambulatory Visit: Payer: BLUE CROSS/BLUE SHIELD

## 2015-07-19 ENCOUNTER — Ambulatory Visit: Payer: BLUE CROSS/BLUE SHIELD

## 2015-07-20 ENCOUNTER — Ambulatory Visit
Admission: RE | Admit: 2015-07-20 | Discharge: 2015-07-20 | Disposition: A | Discharge status: Home / Self Care | Payer: BLUE CROSS/BLUE SHIELD | Source: Ambulatory Visit | Attending: Radiation Oncology | Admitting: Radiation Oncology

## 2015-07-20 DIAGNOSIS — Z510 Encounter for antineoplastic radiation therapy: Secondary | ICD-10-CM | POA: Diagnosis not present

## 2015-07-21 ENCOUNTER — Ambulatory Visit: Payer: BLUE CROSS/BLUE SHIELD

## 2015-07-21 ENCOUNTER — Ambulatory Visit
Admission: RE | Admit: 2015-07-21 | Discharge: 2015-07-21 | Disposition: A | Discharge status: Home / Self Care | Payer: BLUE CROSS/BLUE SHIELD | Source: Ambulatory Visit | Attending: Radiation Oncology | Admitting: Radiation Oncology

## 2015-07-21 ENCOUNTER — Encounter: Payer: Self-pay | Admitting: Radiation Oncology

## 2015-07-21 VITALS — BP 111/73 | HR 78 | Temp 97.5°F | Ht 66.5 in | Wt 158.5 lb

## 2015-07-21 DIAGNOSIS — Z510 Encounter for antineoplastic radiation therapy: Secondary | ICD-10-CM | POA: Diagnosis not present

## 2015-07-21 DIAGNOSIS — C50411 Malignant neoplasm of upper-outer quadrant of right female breast: Secondary | ICD-10-CM

## 2015-07-21 NOTE — Progress Notes (Addendum)
Weekly Management Note Current Dose: 19.8 Gy  Projected Dose: 61 Gy   Narrative:  The patient presents for routine under treatment assessment.  CBCT/MVCT images/Port film x-rays were reviewed.  The chart was checked. Doing well. No complaints. Root canal last Weds.   Physical Findings: Weight: 158 lb 8 oz (71.895 kg). Red rash over chest bilateral arms, back. Dermatitis posteriorly.   Impression:  The patient is tolerating radiation.  Plan:  Continue treatment as planned. Continue radiaplex.  ------------------------------------------------  Thea Silversmith, MD 00.

## 2015-07-21 NOTE — Progress Notes (Signed)
Bonnie Ward has received 11 fractions to her right breast.  Note rash-like erythema of the breast and right shoulder blade.  She denies any itching, pain , nor fatigue at this time.

## 2015-07-22 ENCOUNTER — Ambulatory Visit
Admission: RE | Admit: 2015-07-22 | Discharge: 2015-07-22 | Disposition: A | Discharge status: Home / Self Care | Payer: BLUE CROSS/BLUE SHIELD | Source: Ambulatory Visit | Attending: Radiation Oncology | Admitting: Radiation Oncology

## 2015-07-22 DIAGNOSIS — Z510 Encounter for antineoplastic radiation therapy: Secondary | ICD-10-CM | POA: Diagnosis not present

## 2015-07-23 ENCOUNTER — Ambulatory Visit: Payer: BLUE CROSS/BLUE SHIELD | Admitting: Nurse Practitioner

## 2015-07-23 ENCOUNTER — Other Ambulatory Visit: Payer: BLUE CROSS/BLUE SHIELD

## 2015-07-23 ENCOUNTER — Ambulatory Visit
Admission: RE | Admit: 2015-07-23 | Discharge: 2015-07-23 | Disposition: A | Discharge status: Home / Self Care | Payer: BLUE CROSS/BLUE SHIELD | Source: Ambulatory Visit | Attending: Radiation Oncology | Admitting: Radiation Oncology

## 2015-07-23 ENCOUNTER — Ambulatory Visit: Payer: BLUE CROSS/BLUE SHIELD

## 2015-07-23 DIAGNOSIS — Z510 Encounter for antineoplastic radiation therapy: Secondary | ICD-10-CM | POA: Diagnosis not present

## 2015-07-24 ENCOUNTER — Ambulatory Visit
Admission: RE | Admit: 2015-07-24 | Discharge: 2015-07-24 | Disposition: A | Discharge status: Home / Self Care | Payer: BLUE CROSS/BLUE SHIELD | Source: Ambulatory Visit | Attending: Radiation Oncology | Admitting: Radiation Oncology

## 2015-07-24 DIAGNOSIS — Z510 Encounter for antineoplastic radiation therapy: Secondary | ICD-10-CM | POA: Diagnosis not present

## 2015-07-27 ENCOUNTER — Ambulatory Visit
Admission: RE | Admit: 2015-07-27 | Discharge: 2015-07-27 | Disposition: A | Discharge status: Home / Self Care | Payer: BLUE CROSS/BLUE SHIELD | Source: Ambulatory Visit | Attending: Radiation Oncology | Admitting: Radiation Oncology

## 2015-07-27 DIAGNOSIS — Z510 Encounter for antineoplastic radiation therapy: Secondary | ICD-10-CM | POA: Diagnosis not present

## 2015-07-28 ENCOUNTER — Encounter: Payer: Self-pay | Admitting: Radiation Oncology

## 2015-07-28 ENCOUNTER — Ambulatory Visit
Admission: RE | Admit: 2015-07-28 | Discharge: 2015-07-28 | Disposition: A | Discharge status: Home / Self Care | Payer: BLUE CROSS/BLUE SHIELD | Source: Ambulatory Visit | Attending: Radiation Oncology | Admitting: Radiation Oncology

## 2015-07-28 VITALS — BP 114/75 | HR 71 | Temp 97.7°F | Ht 66.5 in | Wt 156.6 lb

## 2015-07-28 DIAGNOSIS — T451X5A Adverse effect of antineoplastic and immunosuppressive drugs, initial encounter: Secondary | ICD-10-CM | POA: Diagnosis not present

## 2015-07-28 DIAGNOSIS — C50411 Malignant neoplasm of upper-outer quadrant of right female breast: Secondary | ICD-10-CM | POA: Insufficient documentation

## 2015-07-28 DIAGNOSIS — G620 Drug-induced polyneuropathy: Secondary | ICD-10-CM | POA: Diagnosis not present

## 2015-07-28 DIAGNOSIS — R12 Heartburn: Secondary | ICD-10-CM | POA: Diagnosis not present

## 2015-07-28 DIAGNOSIS — Z510 Encounter for antineoplastic radiation therapy: Secondary | ICD-10-CM | POA: Diagnosis not present

## 2015-07-28 MED ORDER — SONAFINE EX EMUL
1.0000 "application " | Freq: Two times a day (BID) | CUTANEOUS | Status: DC
  Administered 2015-07-28: 1 via TOPICAL
  Filled 2015-07-28: qty 45

## 2015-07-28 NOTE — Progress Notes (Signed)
Bonnie Ward is here for her 16th fraction of radiation to her Right Breast. She admits to some fatigue, especially at the end of her work day when she is very tired. Her Right Breast area is red and tender. The area below her Right shoulder on her back (where her bra would go) is red, and very tender with small areas of dry skin visible all over. The redness continues to her axilla and then along her lateral Right breast to the inframammary fold. She is using the radiaplex cream 2-3 times daily. The skin is very dry to these areas, and she does admit to the area itching at times. She has no other complaints at this time.   BP 114/75 mmHg  Pulse 71  Temp(Src) 97.7 F (36.5 C)  Ht 5' 6.5" (1.689 m)  Wt 156 lb 9.6 oz (71.033 kg)  BMI 24.90 kg/m2  LMP 01/25/2011

## 2015-07-28 NOTE — Progress Notes (Signed)
Weekly Management Note Current Dose: 28.8 Gy  Projected Dose: 61 Gy   Narrative:  The patient presents for routine under treatment assessment.  CBCT/MVCT images/Port film x-rays were reviewed.  The chart was checked. Itchin over shoulder and inframammary fold. Using radiaplex with little relief. Questions about complications related to reconstruction.   Physical Findings: Weight: 156 lb 9.6 oz (71.033 kg). Red rash over chest bilateral arms, back. Dermatitis posteriorly. All dry. No moist desquamation.   Impression:  The patient is tolerating radiation.  Plan:  Continue treatment as planned. Switch to sonafine.   ------------------------------------------------  Thea Silversmith, MD 00.

## 2015-07-29 ENCOUNTER — Ambulatory Visit
Admission: RE | Admit: 2015-07-29 | Discharge: 2015-07-29 | Disposition: A | Discharge status: Home / Self Care | Payer: BLUE CROSS/BLUE SHIELD | Source: Ambulatory Visit | Attending: Radiation Oncology | Admitting: Radiation Oncology

## 2015-07-29 ENCOUNTER — Inpatient Hospital Stay (HOSPITAL_COMMUNITY): Admission: RE | Admit: 2015-07-29 | Payer: BLUE CROSS/BLUE SHIELD | Source: Ambulatory Visit

## 2015-07-29 DIAGNOSIS — Z510 Encounter for antineoplastic radiation therapy: Secondary | ICD-10-CM | POA: Diagnosis not present

## 2015-07-30 ENCOUNTER — Ambulatory Visit (HOSPITAL_BASED_OUTPATIENT_CLINIC_OR_DEPARTMENT_OTHER): Payer: BLUE CROSS/BLUE SHIELD

## 2015-07-30 ENCOUNTER — Ambulatory Visit
Admission: RE | Admit: 2015-07-30 | Discharge: 2015-07-30 | Disposition: A | Discharge status: Home / Self Care | Payer: BLUE CROSS/BLUE SHIELD | Source: Ambulatory Visit | Attending: Radiation Oncology | Admitting: Radiation Oncology

## 2015-07-30 ENCOUNTER — Encounter: Payer: Self-pay | Admitting: *Deleted

## 2015-07-30 ENCOUNTER — Ambulatory Visit (HOSPITAL_BASED_OUTPATIENT_CLINIC_OR_DEPARTMENT_OTHER): Payer: BLUE CROSS/BLUE SHIELD | Admitting: Nurse Practitioner

## 2015-07-30 ENCOUNTER — Other Ambulatory Visit: Payer: Self-pay | Admitting: Nurse Practitioner

## 2015-07-30 ENCOUNTER — Encounter: Payer: Self-pay | Admitting: Nurse Practitioner

## 2015-07-30 ENCOUNTER — Other Ambulatory Visit (HOSPITAL_BASED_OUTPATIENT_CLINIC_OR_DEPARTMENT_OTHER): Payer: BLUE CROSS/BLUE SHIELD

## 2015-07-30 VITALS — BP 120/68 | HR 79 | Temp 97.4°F | Resp 18 | Ht 66.5 in | Wt 154.5 lb

## 2015-07-30 DIAGNOSIS — Z170 Estrogen receptor positive status [ER+]: Secondary | ICD-10-CM | POA: Diagnosis not present

## 2015-07-30 DIAGNOSIS — C50411 Malignant neoplasm of upper-outer quadrant of right female breast: Secondary | ICD-10-CM

## 2015-07-30 DIAGNOSIS — G629 Polyneuropathy, unspecified: Secondary | ICD-10-CM | POA: Diagnosis not present

## 2015-07-30 DIAGNOSIS — R12 Heartburn: Secondary | ICD-10-CM

## 2015-07-30 DIAGNOSIS — Z510 Encounter for antineoplastic radiation therapy: Secondary | ICD-10-CM | POA: Diagnosis not present

## 2015-07-30 DIAGNOSIS — T451X5A Adverse effect of antineoplastic and immunosuppressive drugs, initial encounter: Secondary | ICD-10-CM

## 2015-07-30 DIAGNOSIS — G620 Drug-induced polyneuropathy: Secondary | ICD-10-CM

## 2015-07-30 DIAGNOSIS — Z5112 Encounter for antineoplastic immunotherapy: Secondary | ICD-10-CM | POA: Diagnosis not present

## 2015-07-30 LAB — CBC WITH DIFFERENTIAL/PLATELET
BASO%: 0.5 % (ref 0.0–2.0)
BASOS ABS: 0 10*3/uL (ref 0.0–0.1)
EOS ABS: 0.1 10*3/uL (ref 0.0–0.5)
EOS%: 2.3 % (ref 0.0–7.0)
HCT: 39.2 % (ref 34.8–46.6)
HGB: 13 g/dL (ref 11.6–15.9)
LYMPH%: 17.4 % (ref 14.0–49.7)
MCH: 31.1 pg (ref 25.1–34.0)
MCHC: 33.3 g/dL (ref 31.5–36.0)
MCV: 93.4 fL (ref 79.5–101.0)
MONO#: 0.5 10*3/uL (ref 0.1–0.9)
MONO%: 13.6 % (ref 0.0–14.0)
NEUT#: 2.2 10*3/uL (ref 1.5–6.5)
NEUT%: 66.2 % (ref 38.4–76.8)
Platelets: 271 10*3/uL (ref 145–400)
RBC: 4.19 10*6/uL (ref 3.70–5.45)
RDW: 14.8 % — ABNORMAL HIGH (ref 11.2–14.5)
WBC: 3.3 10*3/uL — ABNORMAL LOW (ref 3.9–10.3)
lymph#: 0.6 10*3/uL — ABNORMAL LOW (ref 0.9–3.3)

## 2015-07-30 LAB — COMPREHENSIVE METABOLIC PANEL
ALK PHOS: 71 U/L (ref 40–150)
ALT: 28 U/L (ref 0–55)
AST: 30 U/L (ref 5–34)
Albumin: 3.8 g/dL (ref 3.5–5.0)
Anion Gap: 10 mEq/L (ref 3–11)
BUN: 12.2 mg/dL (ref 7.0–26.0)
CHLORIDE: 105 meq/L (ref 98–109)
CO2: 27 meq/L (ref 22–29)
Calcium: 9.6 mg/dL (ref 8.4–10.4)
Creatinine: 0.8 mg/dL (ref 0.6–1.1)
EGFR: 80 mL/min/{1.73_m2} — AB (ref >90)
GLUCOSE: 79 mg/dL (ref 70–140)
POTASSIUM: 4.1 meq/L (ref 3.5–5.1)
SODIUM: 142 meq/L (ref 136–145)
Total Bilirubin: 0.42 mg/dL (ref 0.20–1.20)
Total Protein: 7.2 g/dL (ref 6.4–8.3)

## 2015-07-30 MED ORDER — GABAPENTIN 300 MG PO CAPS: 300.0000 mg | ORAL_CAPSULE | Freq: Every day | ORAL | Status: DC

## 2015-07-30 MED ORDER — ACETAMINOPHEN 325 MG PO TABS
ORAL_TABLET | ORAL | Status: AC
  Filled 2015-07-30: qty 2

## 2015-07-30 MED ORDER — TRASTUZUMAB CHEMO INJECTION 440 MG
6.0000 mg/kg | Freq: Once | INTRAVENOUS | Status: AC
  Administered 2015-07-30: 420 mg via INTRAVENOUS
  Filled 2015-07-30: qty 20

## 2015-07-30 MED ORDER — SODIUM CHLORIDE 0.9 % IJ SOLN
10.0000 mL | INTRAMUSCULAR | Status: DC | PRN
  Administered 2015-07-30: 10 mL
  Filled 2015-07-30: qty 10

## 2015-07-30 MED ORDER — HEPARIN SOD (PORK) LOCK FLUSH 100 UNIT/ML IV SOLN
500.0000 [IU] | Freq: Once | INTRAVENOUS | Status: AC | PRN
  Administered 2015-07-30: 500 [IU]
  Filled 2015-07-30: qty 5

## 2015-07-30 MED ORDER — SODIUM CHLORIDE 0.9 % IV SOLN
Freq: Once | INTRAVENOUS | Status: AC
  Administered 2015-07-30: 10:00:00 via INTRAVENOUS

## 2015-07-30 MED ORDER — ACETAMINOPHEN 325 MG PO TABS
650.0000 mg | ORAL_TABLET | Freq: Once | ORAL | Status: AC
  Administered 2015-07-30: 650 mg via ORAL

## 2015-07-30 NOTE — Progress Notes (Signed)
[facility]  [phone] [phone]     ID: Bonnie Ward DOB: June 28, 1961  MR#: [id number]  JQG#:[id number]  Patient Care Team: Lollie Sails, MD as PCP - General (Internal Medicine) Milford Cage, OTR Kem Boroughs, FNP as Nurse Practitioner (Nurse Practitioner) Fanny Skates, MD as Consulting Physician (General Surgery) Chauncey Cruel, MD as Consulting Physician (Oncology) Thea Silversmith, MD as Consulting Physician (Radiation Oncology) Rockwell Germany, RN as Registered Nurse Mauro Kaufmann, RN as Registered Nurse Holley Bouche, NP as Nurse Practitioner (Nurse Practitioner) Kristeen Miss, MD as Consulting Physician (Neurosurgery) PCP: Lollie Sails, MD OTHER MD:  CHIEF COMPLAINT: Estrogen receptor positive breast cancer  CURRENT TREATMENT: trastuzumab; adjuvant radiation   BREAST CANCER HISTORY: From the initial intake note:   Bonnie Ward had routine bilateral screening mammography at the breast Center for [facility] 01/09/2015 showing a possible mass in the right breast. On 12/22/2014 she underwent diagnostic right mammography with tomosynthesis and right breast ultrasonography. The breast density was category C. In the right breast there was an area of increased density with architectural distortion and faint microcalcifications in the upper outer quadrant. There was mild palpable soft tissue thickening in the area in question, but no palpable right axillary lymph nodes. Ultrasound confirmed a 1.7 cm irregular hypoechoic mass. There were no abnormal-appearing right axillary lymph nodes.  Biopsy of the right breast mass in question 12/26/2014 showed (SAA 16-08/08/2005) an invasive ductal carcinoma, grade 1 or 2, with a prognostic panel still pending.  Her subsequent history is as detailed below  INTERVAL HISTORY: [place] returns today for follow-up of her breast cancer. She is due for her next trastuzumab dose today. She tolerates this  well with no side effects that she is aware of. She is also undergoing radiation daily to the right breast. She tolerates this well with the exception of some radiation burns to this area and the right upper back. She has recently been switched from radioplex gel to sonafine.   REVIEW OF SYSTEMS: [name] feels much improved since the completion of chemotherapy. She has some residual numbness to her left foot, but no longer complains of burning. She is on gabapentin QHS and she loves that it helps her sleep. She tried to come off of omeprazole, but her heartburn returned so she started it back. She denies fevers, chills, nausea, vomiting, or changes in bowel or bladder habits. Her energy level is good. She is using tumeric for the arthritis in her hands. She uses eye drops at night. Her eyelashes are gone so she is wearing some "falsies." A detailed review of systems is otherwise stable.  PAST MEDICAL HISTORY: Past Medical History  Diagnosis Date  . ACID REFLUX DISEASE 07/12/2010  . Arachnoid cyst     left frontal lobe  . Migraines   . Arthritis     hands  . Nervous stomach   . Difficulty swallowing pills   . Hypothyroidism   . Stress fracture of foot 12/2014    right  . Glaucoma   . Anxiety   . Dental crowns present   . Seasonal allergies   . Complication of anesthesia 2012    states woke up during colonoscopy with Propofol  . Breast cancer ([place]) 12/2014    right    PAST SURGICAL HISTORY: Past Surgical History  Procedure Laterality Date  . Tonsillectomy  age 54  . Esophagogastroduodenoscopy endoscopy    . Tooth extraction    . Lasik    . Colonoscopy with propofol  10/14/2013  . Radioactive seed guided mastectomy with axillary sentinel lymph node biopsy Right 01/23/2015    Procedure: RIGHT PARTIAL MASTECTOMY WITH RADIOACTIVE SEED LOCALIZATION  WITH RIGHT AXILLARY SENTINEL LYMPH NODE BIOPSY;  Surgeon: Fanny Skates, MD;  Location: [facility];  Service: General;   Laterality: Right;  . Portacath placement Left 01/23/2015    Procedure: INSERTION PORT-A-CATH ;  Surgeon: Fanny Skates, MD;  Location: [facility];  Service: General;  Laterality: Left;  . Re-excision of breast lumpectomy Right 02/16/2015    Procedure: RE-EXCISION OF RIGHT  BREAST LUMPECTOMY MARGINS;  Surgeon: Fanny Skates, MD;  Location: [facility];  Service: General;  Laterality: Right;    FAMILY HISTORY Family History  Problem Relation Age of Onset  . Osteoporosis Mother   . Irritable bowel syndrome Mother   . Hypertension Mother   . Hypertension Father   . Hyperlipidemia Father   . Diabetes Father   . Dementia Father   . Colon cancer Maternal Grandfather 21  . Breast cancer Paternal Aunt   . Breast cancer Paternal Grandmother    the patient's father died at age 55 from pneumonia. [name] died at age 13 after bowel perforation. The patient had one brother, one sister. There is no history of breast or ovarian cancer in the family.  GYNECOLOGIC HISTORY:  Patient's last menstrual period was 01/25/2011. Menarche age 1, the patient is GX P0. She stopped having periods approximately 2011. She is still on hormone replacement, but is "trickling off it". She did take birth control pills for approximately 3 years remotely with no complications  SOCIAL HISTORY:  Falisa works as a Haematologist. She is divorced and lives by herself, with 2 dogs.    ADVANCED DIRECTIVES: The patient's brother [name] is her healthcare power of attorney. He may be reached in [place] at [facility]: Social History  Substance Use Topics  . Smoking status: Former Smoker -- 0.00 packs/day for 0 years    Quit date: 02/19/2005  . Smokeless tobacco: Never Used  . Alcohol Use: Yes     Comment: occasionally     Colonoscopy: February 2016  PAP: June 2015  Bone density: October 2015  Lipid panel:  Allergies  Allergen Reactions  . Lortab  [Hydrocodone-Acetaminophen] Anxiety  . Milk-Related Compounds Diarrhea    GI UPSET    Current Outpatient Prescriptions  Medication Sig Dispense Refill  . bimatoprost (LUMIGAN) 0.03 % ophthalmic solution Place 1 drop into both eyes at bedtime.    . gabapentin (NEURONTIN) 300 MG capsule Take 300 mg by mouth at bedtime.    Marland Kitchen liothyronine (CYTOMEL) 5 MCG tablet Take 5 mcg by mouth 2 (two) times daily.    Marland Kitchen omeprazole (PRILOSEC) 40 MG capsule Take 1 capsule (40 mg total) by mouth daily. 30 capsule 3  . Wound Dressings (SONAFINE) Apply 1 application topically 2 (two) times daily.    Marland Kitchen acetaminophen (TYLENOL) 325 MG tablet Take 650 mg by mouth every 6 (six) hours as needed.    Marland Kitchen emollient (RADIAGEL) gel Apply topically as needed for wound care.    Marland Kitchen ketoconazole (NIZORAL) 2 % cream Apply 1 application topically daily. (Patient not taking: Reported on 07/28/2015) 15 g 0  . lidocaine-prilocaine (EMLA) cream APP EXT AA 1 TIME  3  . LUMIGAN 0.01 % SOLN Place 1 drop into both eyes at bedtime.  0   No current facility-administered medications for this visit.    OBJECTIVE: Middle-aged  white woman who appears stated age 8 Vitals:   07/30/15 0846  BP: 120/68  Pulse: 79  Temp: 97.4 F (36.3 C)  Resp: 18     Body mass index is 24.57 kg/(m^2).    ECOG FS:1 - Symptomatic but completely ambulatory  Skin: warm, dry, acniform rash to bilateral arms, chest, back.  HEENT: sclerae anicteric, conjunctivae pink, oropharynx clear. No thrush or mucositis.  Lymph Nodes: No cervical or supraclavicular lymphadenopathy  Lungs: clear to auscultation bilaterally, no rales, wheezes, or rhonci  Heart: regular rate and rhythm  Abdomen: round, soft, non tender, positive bowel sounds  Musculoskeletal: No focal spinal tenderness, no peripheral edema  Neuro: non focal, well oriented, positive affect  Breasts: right breast status post lumpectomy and currently undergoing radiation. Radiation burns to this breast and  upper right back. No moist desquamation.   LAB RESULTS:  CMP     Component Value Date/Time   NA 142 07/30/2015 0831   NA 140 01/21/2009 0828   K 4.1 07/30/2015 0831   K 4.1 01/21/2009 0828   CL 108 01/21/2009 0828   CO2 27 07/30/2015 0831   CO2 30 01/21/2009 0828   GLUCOSE 79 07/30/2015 0831   GLUCOSE 90 01/21/2009 0828   BUN 12.2 07/30/2015 0831   BUN 13 01/21/2009 0828   CREATININE 0.8 07/30/2015 0831   CREATININE 0.7 01/21/2009 0828   CALCIUM 9.6 07/30/2015 0831   CALCIUM 8.5 01/21/2009 0828   PROT 7.2 07/30/2015 0831   PROT 6.9 01/21/2009 0828   ALBUMIN 3.8 07/30/2015 0831   ALBUMIN 3.7 01/21/2009 0828   AST 30 07/30/2015 0831   AST 22 01/21/2009 0828   ALT 28 07/30/2015 0831   ALT 19 01/21/2009 0828   ALKPHOS 71 07/30/2015 0831   ALKPHOS 43 01/21/2009 0828   BILITOT 0.42 07/30/2015 0831   BILITOT 1.1 01/21/2009 0828   GFRNONAA 95.01 01/21/2009 0828    INo results found for: SPEP, UPEP  Lab Results  Component Value Date   WBC 3.3* 07/30/2015   NEUTROABS 2.2 07/30/2015   HGB 13.0 07/30/2015   HCT 39.2 07/30/2015   MCV 93.4 07/30/2015   PLT 271 07/30/2015      Chemistry      Component Value Date/Time   NA 142 07/30/2015 0831   NA 140 01/21/2009 0828   K 4.1 07/30/2015 0831   K 4.1 01/21/2009 0828   CL 108 01/21/2009 0828   CO2 27 07/30/2015 0831   CO2 30 01/21/2009 0828   BUN 12.2 07/30/2015 0831   BUN 13 01/21/2009 0828   CREATININE 0.8 07/30/2015 0831   CREATININE 0.7 01/21/2009 0828      Component Value Date/Time   CALCIUM 9.6 07/30/2015 0831   CALCIUM 8.5 01/21/2009 0828   ALKPHOS 71 07/30/2015 0831   ALKPHOS 43 01/21/2009 0828   AST 30 07/30/2015 0831   AST 22 01/21/2009 0828   ALT 28 07/30/2015 0831   ALT 19 01/21/2009 0828   BILITOT 0.42 07/30/2015 0831   BILITOT 1.1 01/21/2009 0828       No results found for: LABCA2  No components found for: LABCA125  No results for input(s): INR in the last 168 hours.  Urinalysis      Component Value Date/Time   COLORURINE yellow 10/05/2009 1454   APPEARANCEUR Clear 10/05/2009 1454   LABSPEC 1.010 10/05/2009 1454   PHURINE 7.0 10/05/2009 1454   HGBUR 2+ 10/05/2009 1454   BILIRUBINUR neg 04/13/2015 1605   BILIRUBINUR negative 10/05/2009 1454  PROTEINUR neg 04/13/2015 1605   UROBILINOGEN negative 04/13/2015 1605   UROBILINOGEN 0.2 10/05/2009 1454   NITRITE neg 04/13/2015 1605   NITRITE negative 10/05/2009 1454   LEUKOCYTESUR Negative 04/13/2015 1605    STUDIES: No results found.  ASSESSMENT: 54 y.o. [place] woman status post right breast biopsy 12/26/2014 for a clinical T1 cN0, stage IA invasive ductal carcinoma, grade 1 or 2, estrogen and progesterone receptor positive, with an MIB-1 of 11%, and HER-2 amplified, with a signals ratio of 2.07, number per cell 2.28.  (1) status post right lumpectomy and sentinel lymph node sampling 01/23/2015 for a pT1c pN1, stage IIA invasive ductal carcinoma, grade 1, with focally positive anterior margins  (a) additional surgery 02/16/2015 found residual disease but cleared margins  (2) chemotherapy started 03/05/2015, consisting of doxorubicin and cyclophosphamide in dose dense fashion 4, completed 04/16/2015, followed by paclitaxel weekly x 12 planned cycles, started 04/30/2015,  with trastuzumab and pertuzumab given every 3 weeks.  (a) Paclitaxel stopped after 4 cycles because of progressive neuropathy, last dose 05/28/2015  (b) pertuzumab stopped after 2 cycles (when paclitaxel stopped)  (3) trastuzumab will be continued to complete 1 year (through August 2017)  (a) echocardiogram 04/28/2015 shows a normal ejection fraction  (4) adjuvant radiation to be completed 08/21/15  (5) anti-estrogens to follow radiation  PLAN: Jamya is performing well during radiation, with the exception of some skin breakdown. As far as residual chemotherapy issues are concerned, she will continue on the gabapentin for her residual neuropathy  symptoms and continue with omeprazole for reflux. We will refill these medications today.   Petula will proceed with her next dose of trasutuzmab today. She has not yet been scheduled for a repeat echocardiogram, which is due this month, so I have placed the appropriate orders.   She will return in 3 weeks for follow up and her next dose of antibodies. During this visit Dr. Jana Hakim will review antiestrogen therapy with her. She understands and agrees with this plan. She knows the goal of treatment in her case is cure. She has been encouraged to call with any issues that might arise before her next visit here.  Total time spent in appointment was 25 minutes, with greater than 50% of the time spent face to face with the patient.    Laurie Panda, NP   07/30/2015 9:27 AM

## 2015-07-30 NOTE — Patient Instructions (Addendum)
[facility] Discharge Instructions for Patients Receiving Chemotherapy  Today you received the following chemotherapy agents: Herceptin   To help prevent nausea and vomiting after your treatment, we encourage you to take your nausea medication as directed.    If you develop nausea and vomiting that is not controlled by your nausea medication, call the clinic.   BELOW ARE SYMPTOMS THAT SHOULD BE REPORTED IMMEDIATELY:  *FEVER GREATER THAN 100.5 F  *CHILLS WITH OR WITHOUT FEVER  NAUSEA AND VOMITING THAT IS NOT CONTROLLED WITH YOUR NAUSEA MEDICATION  *UNUSUAL SHORTNESS OF BREATH  *UNUSUAL BRUISING OR BLEEDING  TENDERNESS IN MOUTH AND THROAT WITH OR WITHOUT PRESENCE OF ULCERS  *URINARY PROBLEMS  *BOWEL PROBLEMS  UNUSUAL RASH Items with * indicate a potential emergency and should be followed up as soon as possible.  Feel free to call the clinic you have any questions or concerns. The clinic phone number is [phone].  Please show the CHEMO ALERT CARD at check-in to the Emergency Department and triage nurse.   

## 2015-07-31 ENCOUNTER — Ambulatory Visit
Admission: RE | Admit: 2015-07-31 | Discharge: 2015-07-31 | Disposition: A | Discharge status: Home / Self Care | Payer: BLUE CROSS/BLUE SHIELD | Source: Ambulatory Visit | Attending: Radiation Oncology | Admitting: Radiation Oncology

## 2015-07-31 DIAGNOSIS — Z510 Encounter for antineoplastic radiation therapy: Secondary | ICD-10-CM | POA: Diagnosis not present

## 2015-08-03 ENCOUNTER — Ambulatory Visit
Admission: RE | Admit: 2015-08-03 | Payer: BLUE CROSS/BLUE SHIELD | Source: Ambulatory Visit | Admitting: Radiation Oncology

## 2015-08-03 ENCOUNTER — Ambulatory Visit
Admission: RE | Admit: 2015-08-03 | Discharge: 2015-08-03 | Disposition: A | Discharge status: Home / Self Care | Payer: BLUE CROSS/BLUE SHIELD | Source: Ambulatory Visit | Attending: Radiation Oncology | Admitting: Radiation Oncology

## 2015-08-03 ENCOUNTER — Inpatient Hospital Stay
Admission: RE | Admit: 2015-08-03 | Payer: BLUE CROSS/BLUE SHIELD | Source: Ambulatory Visit | Admitting: Radiation Oncology

## 2015-08-03 DIAGNOSIS — Z510 Encounter for antineoplastic radiation therapy: Secondary | ICD-10-CM | POA: Diagnosis not present

## 2015-08-04 ENCOUNTER — Ambulatory Visit
Admission: RE | Admit: 2015-08-04 | Discharge: 2015-08-04 | Disposition: A | Discharge status: Home / Self Care | Payer: BLUE CROSS/BLUE SHIELD | Source: Ambulatory Visit | Attending: Radiation Oncology | Admitting: Radiation Oncology

## 2015-08-04 ENCOUNTER — Encounter: Payer: Self-pay | Admitting: Radiation Oncology

## 2015-08-04 VITALS — BP 114/75 | HR 75 | Temp 97.6°F | Ht 65.5 in | Wt 154.9 lb

## 2015-08-04 DIAGNOSIS — C50411 Malignant neoplasm of upper-outer quadrant of right female breast: Secondary | ICD-10-CM

## 2015-08-04 DIAGNOSIS — Z510 Encounter for antineoplastic radiation therapy: Secondary | ICD-10-CM | POA: Diagnosis not present

## 2015-08-04 NOTE — Progress Notes (Signed)
Hydrogel wound dressing applied to right upper back over radiation area.

## 2015-08-04 NOTE — Progress Notes (Signed)
Weekly Management Note Current Dose: 28.8 Gy  Projected Dose: 61 Gy   Narrative:  The patient presents for routine under treatment assessment.  CBCT/MVCT images/Port film x-rays were reviewed.  The chart was checked. Itchin over shoulder and inframammary fold. Using radiaplex with little relief. Questions about complications related to reconstruction.   Physical Findings: Weight:  . Red rash over chest bilateral arms, back. Dermatitis posteriorly. All dry. No moist desquamation.   Impression:  The patient is tolerating radiation.  Plan:  Continue treatment as planned. Switch to sonafine.   ------------------------------------------------  Thea Silversmith, MD 00.

## 2015-08-04 NOTE — Progress Notes (Signed)
Bonnie Ward has received 3 fractions.  Reports upper back pain to radiation site level 10 today.  Skin has a rash to upper back and upper chest area with redness under the right breast and burned from radiation.  Appetite is good.  Energy level good in the morning less at the end of the day.  BP 114/75 mmHg  Pulse 75  Temp(Src) 97.6 F (36.4 C) (Oral)  Ht 5' 5.5" (1.664 m)  Wt 154 lb 14.4 oz (70.262 kg)  BMI 25.38 kg/m2  SpO2 100%  LMP 01/25/2011  Wt Readings from Last 3 Encounters:  08/04/15 154 lb 14.4 oz (70.262 kg)  07/30/15 154 lb 8 oz (70.081 kg)  07/28/15 156 lb 9.6 oz (71.033 kg)

## 2015-08-04 NOTE — Progress Notes (Signed)
Weekly Management Note Current Dose: 37.8 Gy  Projected Dose: 61 Gy   Narrative:  The patient presents for routine under treatment assessment.  CBCT/MVCT images/Port film x-rays were reviewed.  The chart was checked. Itchin over shoulder and inframammary fold is worse. Scapula > breast. Can't sleep. Does not want to take apin medications. Sonafine not helping.   Physical Findings: Weight: 154 lb 14.4 oz (70.262 kg). Red rash over chest bilateral arms, back. Dermatitis posteriorly. All dry. No moist desquamation at all. .   Impression:  The patient is tolerating radiation.  Plan:  Continue treatment as planned. Add hydrogel.   ------------------------------------------------  Thea Silversmith, MD 00.

## 2015-08-05 ENCOUNTER — Ambulatory Visit
Admission: RE | Admit: 2015-08-05 | Discharge: 2015-08-05 | Disposition: A | Discharge status: Home / Self Care | Payer: BLUE CROSS/BLUE SHIELD | Source: Ambulatory Visit | Attending: Radiation Oncology | Admitting: Radiation Oncology

## 2015-08-05 DIAGNOSIS — Z510 Encounter for antineoplastic radiation therapy: Secondary | ICD-10-CM | POA: Diagnosis not present

## 2015-08-06 ENCOUNTER — Ambulatory Visit
Admission: RE | Admit: 2015-08-06 | Discharge: 2015-08-06 | Disposition: A | Discharge status: Home / Self Care | Payer: BLUE CROSS/BLUE SHIELD | Source: Ambulatory Visit | Attending: Radiation Oncology | Admitting: Radiation Oncology

## 2015-08-06 DIAGNOSIS — Z510 Encounter for antineoplastic radiation therapy: Secondary | ICD-10-CM | POA: Diagnosis not present

## 2015-08-07 ENCOUNTER — Ambulatory Visit
Admission: RE | Admit: 2015-08-07 | Discharge: 2015-08-07 | Disposition: A | Discharge status: Home / Self Care | Payer: BLUE CROSS/BLUE SHIELD | Source: Ambulatory Visit | Attending: Radiation Oncology | Admitting: Radiation Oncology

## 2015-08-07 DIAGNOSIS — Z510 Encounter for antineoplastic radiation therapy: Secondary | ICD-10-CM | POA: Diagnosis not present

## 2015-08-10 ENCOUNTER — Ambulatory Visit
Admission: RE | Admit: 2015-08-10 | Discharge: 2015-08-10 | Disposition: A | Discharge status: Home / Self Care | Payer: BLUE CROSS/BLUE SHIELD | Source: Ambulatory Visit | Attending: Radiation Oncology | Admitting: Radiation Oncology

## 2015-08-10 DIAGNOSIS — Z510 Encounter for antineoplastic radiation therapy: Secondary | ICD-10-CM | POA: Diagnosis not present

## 2015-08-11 ENCOUNTER — Encounter: Payer: Self-pay | Admitting: Radiation Oncology

## 2015-08-11 ENCOUNTER — Ambulatory Visit
Admission: RE | Admit: 2015-08-11 | Discharge: 2015-08-11 | Disposition: A | Discharge status: Home / Self Care | Payer: BLUE CROSS/BLUE SHIELD | Source: Ambulatory Visit | Attending: Radiation Oncology | Admitting: Radiation Oncology

## 2015-08-11 VITALS — BP 131/73 | HR 80 | Temp 97.5°F | Resp 20

## 2015-08-11 DIAGNOSIS — Z510 Encounter for antineoplastic radiation therapy: Secondary | ICD-10-CM | POA: Diagnosis not present

## 2015-08-11 DIAGNOSIS — C50411 Malignant neoplasm of upper-outer quadrant of right female breast: Secondary | ICD-10-CM

## 2015-08-11 NOTE — Progress Notes (Signed)
  Radiation Oncology         [phone] ________________________________  Name: Bonnie Ward MRN: [id number]  Date: 08/11/2015  DOB: [birth year]  Weekly Radiation Therapy Management    ICD-9-CM ICD-10-CM   1. Breast cancer of upper-outer quadrant of right female breast ([place]) 174.4 C50.411      Current Dose: 47 Gy     Planned Dose:  61 Gy  Narrative . . . . . . . . The patient presents for routine under treatment assessment.                                   The patient is without complaint except for some itching in the treatment area. Right upper back is the most bothersome.   She is using Neosporin plus in this area.                         Set-up films were reviewed.                                 The chart was checked. Physical Findings. . .  oral temperature is 97.5 F (36.4 C). Her blood pressure is 131/73 and her pulse is 80. Her respiration is 20. Marland Kitchen Desquamation and radiation dermatitis noted along the treatment area but no skin breakdown appreciated. The lungs are clear. The heart has a regular rhythm and rate Impression . . . . . . . The patient is tolerating radiation. Plan . . . . . . . . . . . . Continue treatment as planned.  ________________________________   Blair Promise, PhD, MD

## 2015-08-11 NOTE — Progress Notes (Signed)
Weekly rad tx right breast, excpriated front breast and on back, using neosporin with pain relief,  5/10 scale, ,  Helps numb the skin, gets Herceptin  every 3 weeks,  8:21 AM BP 131/73 mmHg  Pulse 80  Temp(Src) 97.5 F (36.4 C) (Oral)  Resp 20  LMP 01/25/2011  Wt Readings from Last 3 Encounters:  08/04/15 154 lb 14.4 oz (70.262 kg)  07/30/15 154 lb 8 oz (70.081 kg)  07/28/15 156 lb 9.6 oz (71.033 kg)

## 2015-08-12 ENCOUNTER — Ambulatory Visit
Admission: RE | Admit: 2015-08-12 | Discharge: 2015-08-12 | Disposition: A | Discharge status: Home / Self Care | Payer: BLUE CROSS/BLUE SHIELD | Source: Ambulatory Visit | Attending: Radiation Oncology | Admitting: Radiation Oncology

## 2015-08-12 ENCOUNTER — Telehealth (HOSPITAL_COMMUNITY): Payer: Self-pay | Admitting: Vascular Surgery

## 2015-08-12 DIAGNOSIS — Z510 Encounter for antineoplastic radiation therapy: Secondary | ICD-10-CM | POA: Diagnosis not present

## 2015-08-12 NOTE — Telephone Encounter (Signed)
Left message to make pt f/.u appt in Jan

## 2015-08-13 ENCOUNTER — Ambulatory Visit
Admission: RE | Admit: 2015-08-13 | Discharge: 2015-08-13 | Disposition: A | Discharge status: Home / Self Care | Payer: BLUE CROSS/BLUE SHIELD | Source: Ambulatory Visit | Attending: Radiation Oncology | Admitting: Radiation Oncology

## 2015-08-13 DIAGNOSIS — Z510 Encounter for antineoplastic radiation therapy: Secondary | ICD-10-CM | POA: Diagnosis not present

## 2015-08-14 ENCOUNTER — Ambulatory Visit
Admission: RE | Admit: 2015-08-14 | Discharge: 2015-08-14 | Disposition: A | Discharge status: Home / Self Care | Payer: BLUE CROSS/BLUE SHIELD | Source: Ambulatory Visit | Attending: Radiation Oncology | Admitting: Radiation Oncology

## 2015-08-14 DIAGNOSIS — Z510 Encounter for antineoplastic radiation therapy: Secondary | ICD-10-CM | POA: Diagnosis not present

## 2015-08-15 ENCOUNTER — Ambulatory Visit: Payer: BLUE CROSS/BLUE SHIELD

## 2015-08-18 ENCOUNTER — Ambulatory Visit
Admission: RE | Admit: 2015-08-18 | Discharge: 2015-08-18 | Disposition: A | Discharge status: Home / Self Care | Payer: BLUE CROSS/BLUE SHIELD | Source: Ambulatory Visit | Attending: Radiation Oncology | Admitting: Radiation Oncology

## 2015-08-18 ENCOUNTER — Encounter: Payer: Self-pay | Admitting: Radiation Oncology

## 2015-08-18 VITALS — BP 124/80 | HR 78 | Temp 97.7°F | Ht 65.5 in | Wt 154.5 lb

## 2015-08-18 DIAGNOSIS — Z510 Encounter for antineoplastic radiation therapy: Secondary | ICD-10-CM | POA: Diagnosis not present

## 2015-08-18 DIAGNOSIS — C50411 Malignant neoplasm of upper-outer quadrant of right female breast: Secondary | ICD-10-CM

## 2015-08-18 NOTE — Progress Notes (Addendum)
Bonnie Ward has received 30 fractions to the right breast.  Denies pain or tenderness.  Appetite is good.  Energy level is good.  Skin to right breast with redness under the breast thinks it's  From her bra, using Sonafine Bid. BP 124/80 mmHg  Pulse 78  Temp(Src) 97.7 F (36.5 C) (Oral)  Ht 5' 5.5" (1.664 m)  Wt 154 lb 8 oz (70.081 kg)  BMI 25.31 kg/m2  SpO2 100%  LMP 01/25/2011  Purchase a lotion with vitamin E two weeks after completion of treatment or after you have no redness to the breast.  Asked to stop by the Radiation waitng area receptioistt and schedule an one month F/U appointment with Dr. Pablo Ledger.  Does not have a survivorship appointment told that she would be contacted the the survivorship nurse.  Does not have an appointment yet to see her medical oncologist told the office would make the appointment.  Booklets given on survivorship and FYNN.

## 2015-08-18 NOTE — Progress Notes (Signed)
Weekly Management Note Current Dose:  55 Gy  Projected Dose: 61 Gy   Narrative:  The patient presents for routine under treatment assessment.  CBCT/MVCT images/Port film x-rays were reviewed.  The chart was checked.  Mrs. Handwerk has received 30 fractions to the right breast. Denies pain or tenderness. Appetite and energy level is good.Skin to right breast with redness under the breast and she believes this is from her bra. She is using Sonafine cream bid.  Physical Findings: Weight: 154 lb 8 oz (70.081 kg). Skin is healing well with mostly brown changes over the right scapula and right breast with some residual pink skin.  Impression:  The patient is tolerating radiation.  Plan:  Continue treatment as planned. Discussed survivorship and post RT skin care.   This document serves as a record of services personally performed by Thea Silversmith, MD. It was created on her behalf by Darcus Austin, a trained medical scribe. The creation of this record is based on the scribe's personal observations and the provider's statements to them. This document has been checked and approved by the attending provider.

## 2015-08-19 ENCOUNTER — Ambulatory Visit
Admission: RE | Admit: 2015-08-19 | Discharge: 2015-08-19 | Disposition: A | Discharge status: Home / Self Care | Payer: BLUE CROSS/BLUE SHIELD | Source: Ambulatory Visit | Attending: Radiation Oncology | Admitting: Radiation Oncology

## 2015-08-19 DIAGNOSIS — Z510 Encounter for antineoplastic radiation therapy: Secondary | ICD-10-CM | POA: Diagnosis not present

## 2015-08-20 ENCOUNTER — Telehealth: Payer: Self-pay | Admitting: Nurse Practitioner

## 2015-08-20 ENCOUNTER — Ambulatory Visit (HOSPITAL_BASED_OUTPATIENT_CLINIC_OR_DEPARTMENT_OTHER): Payer: BLUE CROSS/BLUE SHIELD

## 2015-08-20 ENCOUNTER — Other Ambulatory Visit: Payer: BLUE CROSS/BLUE SHIELD

## 2015-08-20 ENCOUNTER — Ambulatory Visit: Payer: BLUE CROSS/BLUE SHIELD

## 2015-08-20 ENCOUNTER — Ambulatory Visit (HOSPITAL_COMMUNITY)
Admission: RE | Admit: 2015-08-20 | Discharge: 2015-08-20 | Disposition: A | Discharge status: Home / Self Care | Payer: BLUE CROSS/BLUE SHIELD | Source: Ambulatory Visit | Attending: Nurse Practitioner | Admitting: Nurse Practitioner

## 2015-08-20 ENCOUNTER — Ambulatory Visit
Admission: RE | Admit: 2015-08-20 | Discharge: 2015-08-20 | Disposition: A | Discharge status: Home / Self Care | Payer: BLUE CROSS/BLUE SHIELD | Source: Ambulatory Visit | Attending: Radiation Oncology | Admitting: Radiation Oncology

## 2015-08-20 ENCOUNTER — Other Ambulatory Visit (HOSPITAL_BASED_OUTPATIENT_CLINIC_OR_DEPARTMENT_OTHER): Payer: BLUE CROSS/BLUE SHIELD

## 2015-08-20 ENCOUNTER — Other Ambulatory Visit: Payer: Self-pay | Admitting: Nurse Practitioner

## 2015-08-20 VITALS — BP 115/74 | HR 82 | Temp 97.8°F | Resp 18

## 2015-08-20 DIAGNOSIS — C50411 Malignant neoplasm of upper-outer quadrant of right female breast: Secondary | ICD-10-CM

## 2015-08-20 DIAGNOSIS — Z452 Encounter for adjustment and management of vascular access device: Secondary | ICD-10-CM | POA: Diagnosis not present

## 2015-08-20 DIAGNOSIS — Z87891 Personal history of nicotine dependence: Secondary | ICD-10-CM | POA: Diagnosis not present

## 2015-08-20 DIAGNOSIS — Z510 Encounter for antineoplastic radiation therapy: Secondary | ICD-10-CM | POA: Diagnosis not present

## 2015-08-20 DIAGNOSIS — R931 Abnormal findings on diagnostic imaging of heart and coronary circulation: Secondary | ICD-10-CM

## 2015-08-20 LAB — CBC WITH DIFFERENTIAL/PLATELET
BASO%: 0.4 % (ref 0.0–2.0)
Basophils Absolute: 0 10*3/uL (ref 0.0–0.1)
EOS%: 1.6 % (ref 0.0–7.0)
Eosinophils Absolute: 0.1 10*3/uL (ref 0.0–0.5)
HCT: 40.3 % (ref 34.8–46.6)
HEMOGLOBIN: 13.3 g/dL (ref 11.6–15.9)
LYMPH%: 17.3 % (ref 14.0–49.7)
MCH: 30.4 pg (ref 25.1–34.0)
MCHC: 33 g/dL (ref 31.5–36.0)
MCV: 92 fL (ref 79.5–101.0)
MONO#: 0.4 10*3/uL (ref 0.1–0.9)
MONO%: 10.2 % (ref 0.0–14.0)
NEUT%: 70.5 % (ref 38.4–76.8)
NEUTROS ABS: 3 10*3/uL (ref 1.5–6.5)
Platelets: 274 10*3/uL (ref 145–400)
RBC: 4.38 10*6/uL (ref 3.70–5.45)
RDW: 14.9 % — AB (ref 11.2–14.5)
WBC: 4.3 10*3/uL (ref 3.9–10.3)
lymph#: 0.7 10*3/uL — ABNORMAL LOW (ref 0.9–3.3)

## 2015-08-20 LAB — COMPREHENSIVE METABOLIC PANEL
ALT: 29 U/L (ref 0–55)
ANION GAP: 8 meq/L (ref 3–11)
AST: 29 U/L (ref 5–34)
Albumin: 4 g/dL (ref 3.5–5.0)
Alkaline Phosphatase: 83 U/L (ref 40–150)
BUN: 12.6 mg/dL (ref 7.0–26.0)
CHLORIDE: 105 meq/L (ref 98–109)
CO2: 29 meq/L (ref 22–29)
CREATININE: 0.8 mg/dL (ref 0.6–1.1)
Calcium: 9.4 mg/dL (ref 8.4–10.4)
EGFR: 81 mL/min/{1.73_m2} — AB (ref >90)
Glucose: 108 mg/dl (ref 70–140)
POTASSIUM: 4.1 meq/L (ref 3.5–5.1)
SODIUM: 142 meq/L (ref 136–145)
TOTAL PROTEIN: 7.5 g/dL (ref 6.4–8.3)
Total Bilirubin: 0.32 mg/dL (ref 0.20–1.20)

## 2015-08-20 MED ORDER — ACETAMINOPHEN 325 MG PO TABS
ORAL_TABLET | ORAL | Status: AC
  Filled 2015-08-20: qty 2

## 2015-08-20 MED ORDER — ACETAMINOPHEN 325 MG PO TABS
650.0000 mg | ORAL_TABLET | Freq: Once | ORAL | Status: AC
  Administered 2015-08-20: 650 mg via ORAL

## 2015-08-20 MED ORDER — DIPHENHYDRAMINE HCL 25 MG PO CAPS: 25.0000 mg | ORAL_CAPSULE | Freq: Once | ORAL | Status: DC

## 2015-08-20 MED ORDER — SODIUM CHLORIDE 0.9 % IJ SOLN
10.0000 mL | INTRAMUSCULAR | Status: DC | PRN
  Administered 2015-08-20: 10 mL
  Filled 2015-08-20: qty 10

## 2015-08-20 MED ORDER — DIPHENHYDRAMINE HCL 25 MG PO CAPS
ORAL_CAPSULE | ORAL | Status: AC
  Filled 2015-08-20: qty 1

## 2015-08-20 MED ORDER — TRASTUZUMAB CHEMO INJECTION 440 MG: 6.0000 mg/kg | Freq: Once | INTRAVENOUS | Status: DC

## 2015-08-20 MED ORDER — SODIUM CHLORIDE 0.9 % IV SOLN: Freq: Once | INTRAVENOUS | Status: DC

## 2015-08-20 MED ORDER — HEPARIN SOD (PORK) LOCK FLUSH 100 UNIT/ML IV SOLN
500.0000 [IU] | Freq: Once | INTRAVENOUS | Status: AC | PRN
  Administered 2015-08-20: 500 [IU]
  Filled 2015-08-20: qty 5

## 2015-08-20 NOTE — Telephone Encounter (Signed)
Referral noted  For hrt/vasc center and they will call her with an appointment

## 2015-08-20 NOTE — Progress Notes (Signed)
Per Gentry Fitz, pt to receive no treatment today and will have a follow up with cardiology prior to receiving any further treatment.  This was explained to pt and she verbalized understanding.  Pt was deacessed and left ambulatory with no questions or concerns. Pt verbalized understanding to call us should she need anything.

## 2015-08-20 NOTE — Progress Notes (Signed)
  Echocardiogram 2D Echocardiogram has been performed.  Jennette Dubin 08/20/2015, 11:43 AM

## 2015-08-21 ENCOUNTER — Ambulatory Visit
Admission: RE | Admit: 2015-08-21 | Discharge: 2015-08-21 | Disposition: A | Discharge status: Home / Self Care | Payer: BLUE CROSS/BLUE SHIELD | Source: Ambulatory Visit | Attending: Radiation Oncology | Admitting: Radiation Oncology

## 2015-08-21 ENCOUNTER — Ambulatory Visit: Payer: BLUE CROSS/BLUE SHIELD

## 2015-08-21 ENCOUNTER — Encounter: Payer: Self-pay | Admitting: Radiation Oncology

## 2015-08-21 DIAGNOSIS — Z510 Encounter for antineoplastic radiation therapy: Secondary | ICD-10-CM | POA: Diagnosis not present

## 2015-08-25 ENCOUNTER — Encounter: Payer: Self-pay | Admitting: *Deleted

## 2015-08-25 ENCOUNTER — Ambulatory Visit: Payer: BLUE CROSS/BLUE SHIELD

## 2015-08-25 ENCOUNTER — Telehealth (HOSPITAL_COMMUNITY): Payer: Self-pay | Admitting: Vascular Surgery

## 2015-08-25 NOTE — Progress Notes (Signed)
Called and left vm at [facility] for appt d/t low EF on echo

## 2015-08-25 NOTE — Telephone Encounter (Signed)
Left pt message to make f/u appt 

## 2015-08-26 ENCOUNTER — Ambulatory Visit: Payer: BLUE CROSS/BLUE SHIELD

## 2015-08-27 ENCOUNTER — Ambulatory Visit: Payer: BLUE CROSS/BLUE SHIELD

## 2015-08-27 ENCOUNTER — Other Ambulatory Visit: Payer: Self-pay | Admitting: Nurse Practitioner

## 2015-08-31 ENCOUNTER — Encounter (HOSPITAL_COMMUNITY): Payer: Self-pay

## 2015-08-31 ENCOUNTER — Encounter (HOSPITAL_COMMUNITY): Payer: BLUE CROSS/BLUE SHIELD

## 2015-08-31 ENCOUNTER — Ambulatory Visit (HOSPITAL_COMMUNITY)
Admission: RE | Admit: 2015-08-31 | Discharge: 2015-08-31 | Disposition: A | Discharge status: Home / Self Care | Payer: BLUE CROSS/BLUE SHIELD | Source: Ambulatory Visit | Attending: Cardiology | Admitting: Cardiology

## 2015-08-31 VITALS — BP 110/66 | HR 87 | Wt 155.5 lb

## 2015-08-31 DIAGNOSIS — C50911 Malignant neoplasm of unspecified site of right female breast: Secondary | ICD-10-CM | POA: Diagnosis present

## 2015-08-31 DIAGNOSIS — Z79899 Other long term (current) drug therapy: Secondary | ICD-10-CM | POA: Insufficient documentation

## 2015-08-31 DIAGNOSIS — Z87891 Personal history of nicotine dependence: Secondary | ICD-10-CM | POA: Insufficient documentation

## 2015-08-31 DIAGNOSIS — Z8249 Family history of ischemic heart disease and other diseases of the circulatory system: Secondary | ICD-10-CM | POA: Diagnosis not present

## 2015-08-31 DIAGNOSIS — Z87440 Personal history of urinary (tract) infections: Secondary | ICD-10-CM | POA: Insufficient documentation

## 2015-08-31 DIAGNOSIS — K219 Gastro-esophageal reflux disease without esophagitis: Secondary | ICD-10-CM | POA: Diagnosis not present

## 2015-08-31 DIAGNOSIS — G620 Drug-induced polyneuropathy: Secondary | ICD-10-CM | POA: Diagnosis not present

## 2015-08-31 DIAGNOSIS — T451X5A Adverse effect of antineoplastic and immunosuppressive drugs, initial encounter: Secondary | ICD-10-CM

## 2015-08-31 DIAGNOSIS — C50411 Malignant neoplasm of upper-outer quadrant of right female breast: Secondary | ICD-10-CM

## 2015-08-31 DIAGNOSIS — Z9011 Acquired absence of right breast and nipple: Secondary | ICD-10-CM | POA: Insufficient documentation

## 2015-08-31 DIAGNOSIS — Z170 Estrogen receptor positive status [ER+]: Secondary | ICD-10-CM | POA: Insufficient documentation

## 2015-08-31 MED ORDER — CARVEDILOL 3.125 MG PO TABS: 3.1250 mg | ORAL_TABLET | Freq: Two times a day (BID) | ORAL | Status: DC

## 2015-08-31 NOTE — Patient Instructions (Signed)
Start Carvedilol 3.125 mg Twice daily   Your physician recommends that you schedule a follow-up appointment in: 6 weeks with echocardiogram  

## 2015-09-01 ENCOUNTER — Telehealth: Payer: Self-pay | Admitting: Oncology

## 2015-09-01 DIAGNOSIS — T451X5A Adverse effect of antineoplastic and immunosuppressive drugs, initial encounter: Secondary | ICD-10-CM | POA: Insufficient documentation

## 2015-09-01 NOTE — Telephone Encounter (Signed)
Patient called to check time of dr Bonnie Ward on 1/19 as she will not be having labs and tx as she has seen the heart dr and they have cancelled her herceptin for now.  I have called val and she will check into this and she confirmed ok to cx

## 2015-09-01 NOTE — Telephone Encounter (Signed)
Patient called and left a message that she needs an appt with dr Ronney Asters has one 1/19 and i have left her a message that if she is having a new problem and needing a sooner appt to call and ask for triage  anne

## 2015-09-01 NOTE — Progress Notes (Signed)
Patient ID: Bonnie Ward, female   DOB: [birth year], 55 y.o.   MRN: [id number] Oncologist: Dr. Jana Hakim  55 yo with recently-diagnosed triple receptor positive breast cancer presents for cardio-oncology evaluation.  Cancer was diagnosed in 5/16.  She is s/p right partial mastectomy.  Cancer was ER+/PR+/HER2+.  She had chemotherapy with doxorubicin and cyclophosphamide completed 04/16/2015, followed by paclitaxel weekly starting 04/30/2015.  Paclitaxel was stopped due to peripheral neuropathy.  She is currently getting Herceptin every 3 wks.   She is doing well symptomatically.  No exertional dyspnea, able to do spin class without problems.  No lightheadedness or syncope.  No palpitations.  No chest pain.  No orthopnea/PND.   I reviewed today's echo.  EF is lower at 50%.  Global longitudinal strain is also lower.   Labs (12/16): K 4.1, creatinine 0.8  PMH: 1. GERD 2. IBS 3. Recurrent UTIs 4. Breast cancer: ER+/PR+/HER2+.  Diagnosed 5/16, had partial right mastectomy in 6/16.  - Echo (6/16) with EF 55-60%, normal RV, lateral s' 14 cm/sec, GLS -23.3%.  - Doxorubicin/cyclophosphamide until 8/16.  - Paclitaxel until 10/16.  - Herceptin ongoing.  - Echo (12/16) with EF 50%, mild LV dilation, GLS -19.9%.  5. Neuropathy from paclitaxel.  SH: Quit smoking in 2016, works as Haematologist, rare ETOH.   FH: Uncle with MI in his 3s. Grandfather with MI in his 92s.   ROS: All systems reviewed and negative except as per HPI.   Current Outpatient Prescriptions  Medication Sig Dispense Refill  . bimatoprost (LUMIGAN) 0.03 % ophthalmic solution Place 1 drop into both eyes at bedtime.    . gabapentin (NEURONTIN) 300 MG capsule Take 1 capsule (300 mg total) by mouth at bedtime. 30 capsule 2  . ketoconazole (NIZORAL) 2 % cream Apply 1 application topically daily. 15 g 0  . liothyronine (CYTOMEL) 5 MCG tablet Take 5 mcg by mouth 2 (two) times daily.    Marland Kitchen LUMIGAN 0.01 % SOLN Place 1 drop into both eyes at  bedtime.  0  . carvedilol (COREG) 3.125 MG tablet Take 1 tablet (3.125 mg total) by mouth 2 (two) times daily. 60 tablet 3  . emollient (RADIAGEL) gel Apply topically as needed for wound care. Reported on 08/31/2015    . lidocaine-prilocaine (EMLA) cream Reported on 08/31/2015  3  . omeprazole (PRILOSEC) 40 MG capsule TAKE 1 CAPSULE(40 MG) BY MOUTH DAILY 30 capsule 1   No current facility-administered medications for this encounter.   BP 110/66 mmHg  Pulse 87  Wt 155 lb 8 oz (70.534 kg)  SpO2 99%  LMP 01/25/2011 General: NAD Neck: No JVD, no thyromegaly or thyroid nodule.  Lungs: Clear to auscultation bilaterally with normal respiratory effort. CV: Nondisplaced PMI.  Heart regular S1/S2, no S3/S4, no murmur.  No peripheral edema.  No carotid bruit.  Normal pedal pulses.  Abdomen: Soft, nontender, no hepatosplenomegaly, no distention.  Skin: Intact without lesions or rashes. Port right upper chest.  Neurologic: Alert and oriented x 3.  Psych: Normal affect. Extremities: No clubbing or cyanosis.  HEENT: Normal.   Assessment/Plan: 55 yo with triple positive breast cancer s/p partial mastectomy.  He had doxorubicin and cyclophosphamide until 8/16 and is now getting Herceptin, planned to continue until 8/17.  Baseline echo prior to treatment was normal.  I reviewed today's echo: EF now 50% with > 10% fall in global longitudinal strain from baseline.  I am concerned that this is Herceptin cardiotoxicity (prior doxorubicin may also play a role).   -  Would stop Herceptin for now.   - Start Coreg 3.125 mg bid.  - Repeat echo in 6 wks to decided on restart Herceptin.    Loralie Champagne 09/01/2015

## 2015-09-02 NOTE — Progress Notes (Signed)
  Radiation Oncology         [phone] ________________________________  Name: Bonnie Ward MRN: [id number]  Date: 08/21/2015  DOB: [birth year]  End of Treatment Note  Diagnosis:  Breast cancer of upper-outer quadrant of right female breast [facility])   Staging form: Breast, AJCC 7th Edition     Clinical stage from 01/07/2015: Stage IA (T1c, N0, M0) - Unsigned       Staging comments: Staged at breast conference on 5.18.16   Indication for treatment:  Curative      Radiation treatment dates:   07/06/2015-08/21/2015  Site/dose:    Right breast / 45 Gray @ 1.8 Pearline Cables per fraction x 25 fractions Right supraclavicular fossa/PAB  45 Gy @1 .8 Gy per fraction x 25 fractions Right breast boost / 16 Gray at [employer] per fraction x 8 fractions  Beams/energy:  Opposed Tangents / 6 and 10 MV photons LAO/PA  6 MV photons En face electrons 12 MeV.   Narrative: The patient tolerated radiation treatment relatively well.   She had dry desquamation over her back and SCLV fossa which was treated with sonafine.   Plan: The patient has completed radiation treatment. The patient will return to radiation oncology clinic for routine followup in one month. I advised them to call or return sooner if they have any questions or concerns related to their recovery or treatment.  ------------------------------------------------  Thea Silversmith, MD

## 2015-09-10 ENCOUNTER — Ambulatory Visit (HOSPITAL_BASED_OUTPATIENT_CLINIC_OR_DEPARTMENT_OTHER): Payer: BLUE CROSS/BLUE SHIELD | Admitting: Oncology

## 2015-09-10 ENCOUNTER — Telehealth: Payer: Self-pay | Admitting: Oncology

## 2015-09-10 ENCOUNTER — Ambulatory Visit: Payer: BLUE CROSS/BLUE SHIELD

## 2015-09-10 ENCOUNTER — Other Ambulatory Visit: Payer: BLUE CROSS/BLUE SHIELD

## 2015-09-10 VITALS — BP 123/73 | HR 81 | Temp 97.6°F | Resp 18 | Ht 65.5 in | Wt 157.2 lb

## 2015-09-10 DIAGNOSIS — C50411 Malignant neoplasm of upper-outer quadrant of right female breast: Secondary | ICD-10-CM | POA: Diagnosis not present

## 2015-09-10 DIAGNOSIS — I509 Heart failure, unspecified: Secondary | ICD-10-CM | POA: Insufficient documentation

## 2015-09-10 DIAGNOSIS — I5021 Acute systolic (congestive) heart failure: Secondary | ICD-10-CM

## 2015-09-10 DIAGNOSIS — Z170 Estrogen receptor positive status [ER+]: Secondary | ICD-10-CM

## 2015-09-10 DIAGNOSIS — Z79810 Long term (current) use of selective estrogen receptor modulators (SERMs): Secondary | ICD-10-CM

## 2015-09-10 MED ORDER — TAMOXIFEN CITRATE 20 MG PO TABS
20.0000 mg | ORAL_TABLET | Freq: Every day | ORAL | Status: AC
Start: 2015-09-10 — End: 2015-10-10

## 2015-09-10 NOTE — Telephone Encounter (Signed)
Spoke with patient and she is aware of her cancelled and added visits per todays pof

## 2015-09-10 NOTE — Progress Notes (Signed)
[facility]  [phone] [phone]     ID: Bonnie Ward DOB: 06/29/1961  MR#: [id number]  CSN#:[id number]  Patient Care Team: Bonnie Vaughan, MD as PCP - General (Internal Medicine) Bonnie Ward, OTR Bonnie Grubb, FNP as Nurse Practitioner (Nurse Practitioner) Bonnie Ingram, MD as Consulting Physician (General Surgery) Bonnie C Magrinat, MD as Consulting Physician (Oncology) Bonnie Wentworth, MD as Consulting Physician (Radiation Oncology) Bonnie N Martini, RN as Registered Nurse Bonnie C Stuart, RN as Registered Nurse Bonnie W Dawson, NP as Nurse Practitioner (Nurse Practitioner) Bonnie Elsner, MD as Consulting Physician (Neurosurgery) PCP: Ward,ELIZABETH, MD OTHER MD:  CHIEF COMPLAINT: Estrogen receptor positive breast cancer  CURRENT TREATMENT: (trastuzumab); tamoxifen   BREAST CANCER HISTORY: From the initial intake note:   Bonnie Ward had routine bilateral screening mammography at the breast Center for 20 01/09/2015 showing a possible mass in the right breast. On 12/22/2014 she underwent diagnostic right mammography with tomosynthesis and right breast ultrasonography. The breast density was category C. In the right breast there was an area of increased density with architectural distortion and faint microcalcifications in the upper outer quadrant. There was mild palpable soft tissue thickening in the area in question, but no palpable right axillary lymph nodes. Ultrasound confirmed a 1.7 cm irregular hypoechoic mass. There were no abnormal-appearing right axillary lymph nodes.  Biopsy of the right breast mass in question 12/26/2014 showed (SAA 16-08/08/2005) an invasive ductal carcinoma, grade 1 or 2, with a prognostic panel still pending.  Her subsequent history is as detailed below  INTERVAL HISTORY: Bonnie Ward returns today for follow-up of her estrogen receptor and HER-2 positive breast cancer. Since the last visit here, first of  all she completed her radiation treatments. She generally did "fine" with those, although her back was a bit per. That has resolved. Her energy also decreased. Part of that may have been related to the drop in her ejection fraction from trastuzumab. The echocardiogram 08/20/2015 showed a greater than 10% drop in global longitudinal strain compared to baseline. That is the second new development. She was started on caloric, but has not been able to tolerate that. She is able to go to the gym and get some exercise done, and she is hoping this will see "speed up the recovery". For now we are holding the trastuzumab  REVIEW OF SYSTEMS: Bonnie Ward has significant issues with hot flashes. The gabapentin at night is helping quite a bit. In addition she has shooting pains in her left foot particularly at night. This is less noticeable when she is active. Again the gabapentin is helpful in that regard. She has some arthritis, easily noticeable with hand exam, which is long-standing. A detailed review of systems today was otherwise stable  PAST MEDICAL HISTORY: Past Medical History  Diagnosis Date  . ACID REFLUX DISEASE 07/12/2010  . Arachnoid cyst     left frontal lobe  . Migraines   . Arthritis     hands  . Nervous stomach   . Difficulty swallowing pills   . Hypothyroidism   . Stress fracture of foot 12/2014    right  . Glaucoma   . Anxiety   . Dental crowns present   . Seasonal allergies   . Complication of anesthesia 2012    states woke up during colonoscopy with Propofol  . Breast cancer (HCC) 12/2014    right    PAST SURGICAL HISTORY: Past Surgical History  Procedure Laterality Date  . Tonsillectomy  age 6  . Esophagogastroduodenoscopy endoscopy    .   Tooth extraction    . Lasik    . Colonoscopy with propofol  10/14/2013  . Radioactive seed guided mastectomy with axillary sentinel lymph node biopsy Right 01/23/2015    Procedure: RIGHT PARTIAL MASTECTOMY WITH RADIOACTIVE SEED LOCALIZATION  WITH  RIGHT AXILLARY SENTINEL LYMPH NODE BIOPSY;  Surgeon: Bonnie Ingram, MD;  Location: [facility];  Service: General;  Laterality: Right;  . Portacath placement Left 01/23/2015    Procedure: INSERTION PORT-A-CATH ;  Surgeon: Bonnie Ingram, MD;  Location: [facility];  Service: General;  Laterality: Left;  . Re-excision of breast lumpectomy Right 02/16/2015    Procedure: RE-EXCISION OF RIGHT  BREAST LUMPECTOMY MARGINS;  Surgeon: Bonnie Ingram, MD;  Location: [facility];  Service: General;  Laterality: Right;    FAMILY HISTORY Family History  Problem Relation Age of Onset  . Osteoporosis Mother   . Irritable bowel syndrome Mother   . Hypertension Mother   . Hypertension Father   . Hyperlipidemia Father   . Diabetes Father   . Dementia Father   . Colon cancer Maternal Grandfather 70  . Breast cancer Paternal Aunt   . Breast cancer Paternal Grandmother    the patient's father died at age 90 from pneumonia. [name] died at age 90 after bowel perforation. The patient had one brother, one sister. There is no history of breast or ovarian cancer in the family.  GYNECOLOGIC HISTORY:  Patient's last menstrual period was 01/25/2011. Menarche age 11, the patient is GX P0. She stopped having periods approximately 2011. She is still on hormone replacement, but is "trickling off it". She did take birth control pills for approximately 3 years remotely with no complications  SOCIAL HISTORY:  Bonnie Ward works as a hairstylist. She is divorced and lives by herself, with 2 dogs.    ADVANCED DIRECTIVES: The patient's brother Bonnie Ward is her healthcare power of attorney. He may be reached in [place] at [phone]   HEALTH MAINTENANCE: Social History  Substance Use Topics  . Smoking status: Former Smoker -- 0.00 packs/day for 0 years    Quit date: 02/19/2005  . Smokeless tobacco: Never Used  . Alcohol Use: Yes     Comment: occasionally      Colonoscopy: February 2016  PAP: June 2015  Bone density: 12/02/2013-- T - 2.2  Lipid panel:  Allergies  Allergen Reactions  . Lortab [Hydrocodone-Acetaminophen] Anxiety  . Milk-Related Compounds Diarrhea    GI UPSET    Current Outpatient Prescriptions  Medication Sig Dispense Refill  . bimatoprost (LUMIGAN) 0.03 % ophthalmic solution Place 1 drop into both eyes at bedtime.    . gabapentin (NEURONTIN) 300 MG capsule Take 1 capsule (300 mg total) by mouth at bedtime. 30 capsule 2  . lidocaine-prilocaine (EMLA) cream Reported on 08/31/2015  3  . liothyronine (CYTOMEL) 5 MCG tablet Take 5 mcg by mouth 2 (two) times daily.    . omeprazole (PRILOSEC) 40 MG capsule TAKE 1 CAPSULE(40 MG) BY MOUTH DAILY 30 capsule 1  . tamoxifen (NOLVADEX) 20 MG tablet Take 1 tablet (20 mg total) by mouth daily. 90 tablet 12   No current facility-administered medications for this visit.    OBJECTIVE: Middle-aged white woman in no acute distress Filed Vitals:   09/10/15 0818  BP: 123/73  Pulse: 81  Temp: 97.6 F (36.4 C)  Resp: 18     Body mass index is 25.75 kg/(m^2).    ECOG FS:1 - Symptomatic but completely ambulatory  Sclerae unicteric,   pupils round and equal Oropharynx clear and moist-- no thrush or other lesions No cervical or supraclavicular adenopathy Lungs no rales or rhonchi Heart regular rate and rhythm Abd soft, nontender, positive bowel sounds MSK no focal spinal tenderness, no upper extremity lymphedema Neuro: nonfocal, well oriented, appropriate affect Breasts: The right breast is status post lumpectomy and radiation. The hyperpigmentation has nearly completely resolved. The cosmetic result is excellent. The right breast is perhaps 3% smaller than the left, but otherwise very symmetrical. There are no skin or nipple changes of concern. The right axilla is benign. The left breast is unremarkable   LAB RESULTS:  CMP     Component Value Date/Time   NA 142 08/20/2015 1334    NA 140 01/21/2009 0828   K 4.1 08/20/2015 1334   K 4.1 01/21/2009 0828   CL 108 01/21/2009 0828   CO2 29 08/20/2015 1334   CO2 30 01/21/2009 0828   GLUCOSE 108 08/20/2015 1334   GLUCOSE 90 01/21/2009 0828   BUN 12.6 08/20/2015 1334   BUN 13 01/21/2009 0828   CREATININE 0.8 08/20/2015 1334   CREATININE 0.7 01/21/2009 0828   CALCIUM 9.4 08/20/2015 1334   CALCIUM 8.5 01/21/2009 0828   PROT 7.5 08/20/2015 1334   PROT 6.9 01/21/2009 0828   ALBUMIN 4.0 08/20/2015 1334   ALBUMIN 3.7 01/21/2009 0828   AST 29 08/20/2015 1334   AST 22 01/21/2009 0828   ALT 29 08/20/2015 1334   ALT 19 01/21/2009 0828   ALKPHOS 83 08/20/2015 1334   ALKPHOS 43 01/21/2009 0828   BILITOT 0.32 08/20/2015 1334   BILITOT 1.1 01/21/2009 0828   GFRNONAA 95.01 01/21/2009 0828    INo results found for: SPEP, UPEP  Lab Results  Component Value Date   WBC 4.3 08/20/2015   NEUTROABS 3.0 08/20/2015   HGB 13.3 08/20/2015   HCT 40.3 08/20/2015   MCV 92.0 08/20/2015   PLT 274 08/20/2015      Chemistry      Component Value Date/Time   NA 142 08/20/2015 1334   NA 140 01/21/2009 0828   K 4.1 08/20/2015 1334   K 4.1 01/21/2009 0828   CL 108 01/21/2009 0828   CO2 29 08/20/2015 1334   CO2 30 01/21/2009 0828   BUN 12.6 08/20/2015 1334   BUN 13 01/21/2009 0828   CREATININE 0.8 08/20/2015 1334   CREATININE 0.7 01/21/2009 0828      Component Value Date/Time   CALCIUM 9.4 08/20/2015 1334   CALCIUM 8.5 01/21/2009 0828   ALKPHOS 83 08/20/2015 1334   ALKPHOS 43 01/21/2009 0828   AST 29 08/20/2015 1334   AST 22 01/21/2009 0828   ALT 29 08/20/2015 1334   ALT 19 01/21/2009 0828   BILITOT 0.32 08/20/2015 1334   BILITOT 1.1 01/21/2009 0828       No results found for: LABCA2  No components found for: LABCA125  No results for input(s): INR in the last 168 hours.  Urinalysis    Component Value Date/Time   COLORURINE yellow 10/05/2009 1454   APPEARANCEUR Clear 10/05/2009 1454   LABSPEC 1.010 10/05/2009  1454   PHURINE 7.0 10/05/2009 1454   HGBUR 2+ 10/05/2009 1454   BILIRUBINUR neg 04/13/2015 1605   BILIRUBINUR negative 10/05/2009 1454   PROTEINUR neg 04/13/2015 1605   UROBILINOGEN negative 04/13/2015 1605   UROBILINOGEN 0.2 10/05/2009 1454   NITRITE neg 04/13/2015 1605   NITRITE negative 10/05/2009 1454   LEUKOCYTESUR Negative 04/13/2015 1605    STUDIES: No results  found.  ASSESSMENT: 55 y.o. [place] woman status post right breast biopsy 12/26/2014 for a clinical T1 cN0, stage IA invasive ductal carcinoma, grade 1 or 2, estrogen and progesterone receptor positive, with an MIB-1 of 11%, and HER-2 amplified, with a signals ratio of 2.07, number per cell 2.28.  (1) status post right lumpectomy and sentinel lymph node sampling 01/23/2015 for a pT1c pN1, stage IIA invasive ductal carcinoma, grade 1, with focally positive anterior margins  (a) additional surgery 02/16/2015 found residual disease but cleared margins  (2) chemotherapy started 03/05/2015, consisting of doxorubicin and cyclophosphamide in dose dense fashion 4, completed 04/16/2015, followed by paclitaxel weekly x 12 planned cycles, started 04/30/2015,  with trastuzumab and pertuzumab given every 3 weeks.  (a) Paclitaxel stopped after 4 cycles because of progressive neuropathy, last dose 05/28/2015  (b) pertuzumab stopped after 2 cycles (when paclitaxel stopped)  (3) trastuzumab will be continued to complete 1 year (through August 2017)  (a) echocardiogram 08/20/2015 shows a >10% drop-- trastuzumab held after 07/30/2015 dose  (4) adjuvant radiation 07/06/2015-08/21/2015::  Right breast / 45 Gray @ 1.8 Pearline Cables per fraction x 25 fractions Right supraclavicular fossa/PAB 45 Gy _0 .8 Gy per fraction x 25 fractions Right breast boost / 16 Gray at [employer] per fraction x 8 fractions  (5) started tamoxifen 09/10/2015  PLAN: Keina has completed her local treatment for her breast cancer and is now ready to start her antiestrogen  therapy. We discussed the difference between the aromatase inhibitors and tamoxifen. She has a good understanding of the possible toxicities, side effects and complications of these agents. After much discussion we decided we would start with tamoxifen, which would allow her if she wishes to use vaginal estrogen preparations to deal with the vaginal dryness issue. I went ahead and place a prescription today and I will see her again in March to assess for tolerance.  I also suggested that she participate in our intimacy and pelvic health program  She saw cardio oncology for her ejection fraction dropped, and was started on Coreg. However she has been unable to take that medication: Drops her blood pressure and makes her feel tired and weak. At this point we are holding further treatment with trastuzumab until we get cardio oncology clearance. Her next echocardiogram is scheduled for 10/12/2015.  As far as the peripheral neuropathy issues involving her left foot, which was injured previously, my suggestion is that she wear a [place] on that foot at night. It may work as a sort of weak acupuncture and relieve some of the discomfort. Of course she will continue the gabapentin which also is being helpful.  She is going to see [place] again on 10/22/2015 and hopefully we will be able to resume the trastuzumab treatments at that time.   Chauncey Cruel, MD   09/10/2015 9:01 AM

## 2015-09-18 NOTE — Progress Notes (Signed)
Skin status:  Well healed  Lotion being used:  coconut oil  Have you seen your medical oncologist? Date 09-11-15 Dr. Shelda Pal another appointment 10-22-2015  For lab and possible Herceptin, has had heart damage  From this  ER+,have started Tamoxifen 09-10-15  Having hot flashes  Discuss survivorship appointment:Told the survivorship nurse would schedule an appointment.  Educational booklet given  Offer referral to [facility] Flyer/booklet given Appetite: good Pain: none Energy level: good gets tired at times still      Completed radiation right breast  08/21/15  Will start Fyyn in April next session,   BP 114/70 mmHg  Pulse 84  Temp(Src) 97.8 F (36.6 C) (Oral)  Resp 20  Ht 5' 5.5" (1.664 m)  Wt 158 lb 8 oz (71.895 kg)  BMI 25.97 kg/m2  LMP 01/25/2011  Wt Readings from Last 3 Encounters:  09/23/15 158 lb 8 oz (71.895 kg)  09/10/15 157 lb 3.2 oz (71.305 kg)  08/31/15 155 lb 8 oz (70.534 kg)

## 2015-09-21 NOTE — Progress Notes (Signed)
Name: Bonnie Ward   MRN: [id number]  Date:  08/21/2015   DOB: 22-Mar-1961  Status:outpatient    DIAGNOSIS: Breast cancer of upper-outer quadrant of right female breast [facility])   Staging form: Breast, AJCC 7th Edition     Clinical stage from 01/07/2015: Stage IA (T1c, N0, M0) - Unsigned       Staging comments: Staged at breast conference on 5.18.16  CONSENT VERIFIED: yes   SET UP: Patient is setup supine   IMMOBILIZATION:  The following immobilization was used:Custom Moldable Pillow, breast board.   NARRATIVE: Bonnie Ward underwent complex simulation and treatment planning for her boost treatment today.  Her tumor volume was outlined on the planning CT scan. The depth of her cavity was felt to be appropriate for treatment with electrons    12  MeV electrons will be prescribed to the 100%  isodose line.   I personally oversaw and approved the construction of a unique block which will be used for beam modification purposes.  An isodose plan is requested.

## 2015-09-23 ENCOUNTER — Encounter: Payer: Self-pay | Admitting: Radiation Oncology

## 2015-09-23 ENCOUNTER — Ambulatory Visit
Admission: RE | Admit: 2015-09-23 | Discharge: 2015-09-23 | Disposition: A | Discharge status: Home / Self Care | Payer: BLUE CROSS/BLUE SHIELD | Source: Ambulatory Visit | Attending: Radiation Oncology | Admitting: Radiation Oncology

## 2015-09-23 VITALS — BP 114/70 | HR 84 | Temp 97.8°F | Resp 20 | Ht 65.5 in | Wt 158.5 lb

## 2015-09-23 DIAGNOSIS — C50411 Malignant neoplasm of upper-outer quadrant of right female breast: Secondary | ICD-10-CM

## 2015-09-23 NOTE — Progress Notes (Signed)
   Department of Radiation Oncology  Phone:  [phone] Fax:        [phone]   Name: Bonnie Ward MRN: [id number]  DOB: 10/14/1960  Date: 09/23/2015  Follow Up Visit Note  Diagnosis: Breast cancer of upper-outer quadrant of right female breast [facility])   Staging form: Breast, AJCC 7th Edition     Clinical stage from 01/07/2015: Stage IA (T1c, N0, M0) - Unsigned       Staging comments: Staged at breast conference on 5.18.16  Summary and Interval since last radiation: 1 month 07/06/2015-08/21/2015  Site/dose:    Right breast / 45 Gray @ 1.8 Pearline Cables per fraction x 25 fractions Right supraclavicular fossa/PAB  45 Gy @1 .8 Gy per fraction x 25 fractions Right breast boost / 16 Gray at [employer] per fraction x 8 fractions  Interval History: Bonnie Ward presents today for routine followup. She reports feeling well, but is fatigued at the end of the day. She takes her Gabapentin as prescribed. She had an echo on 08/20/15. Ejection fraction is now 50% with > 10% fall in global longitudinal strain from baseline and Dr. Aundra Dubin was concerned for Herceptin cardiotoxicity. She was started on Coreg, but was unable to take that medication due to it dropping her blood pressure and making her feel tired and weak. At this point, Dr. Jana Hakim is holding further treatment with trastuzumab until she get's cardio oncology clearance.  Physical Exam:  Filed Vitals:   09/23/15 0800  BP: 114/70  Pulse: 84  Temp: 97.8 F (36.6 C)  TempSrc: Oral  Resp: 20  Height: 5' 5.5" (1.664 m)  Weight: 158 lb 8 oz (71.895 kg)  Her skin over her back is slightly hyperpigmented especially around her birthmark. Skin of the right breast is well healed.   IMPRESSION: Bonnie Ward is a 55 y.o. female with stage IA right breast cancer resolving from the acute effects of radiation.  PLAN: She is doing well. We discussed the need for follow up every 4-6 months which she has scheduled.  We discussed the need for yearly mammograms which she can  schedule with her OBGYN or with medical oncology. We discussed the need for sun protection in the treated area.  She can always call me with questions.  I will follow up with her on an as needed basis.   She is scheduled for an echocardiogram on 10/12/15.   Thea Silversmith, MD  This document serves as a record of services personally performed by Thea Silversmith, MD. It was created on her behalf by Darcus Austin, a trained medical scribe. The creation of this record is based on the scribe's personal observations and the provider's statements to them. This document has been checked and approved by the attending provider.

## 2015-09-27 ENCOUNTER — Other Ambulatory Visit: Payer: Self-pay | Admitting: Nurse Practitioner

## 2015-09-28 ENCOUNTER — Other Ambulatory Visit: Payer: Self-pay | Admitting: *Deleted

## 2015-09-28 DIAGNOSIS — K219 Gastro-esophageal reflux disease without esophagitis: Secondary | ICD-10-CM

## 2015-09-28 MED ORDER — OMEPRAZOLE 40 MG PO CPDR: 40.0000 mg | DELAYED_RELEASE_CAPSULE | Freq: Every day | ORAL | Status: DC

## 2015-10-01 ENCOUNTER — Other Ambulatory Visit: Payer: BLUE CROSS/BLUE SHIELD

## 2015-10-01 ENCOUNTER — Ambulatory Visit: Payer: BLUE CROSS/BLUE SHIELD

## 2015-10-12 ENCOUNTER — Encounter (HOSPITAL_COMMUNITY): Payer: Self-pay

## 2015-10-12 ENCOUNTER — Ambulatory Visit (HOSPITAL_BASED_OUTPATIENT_CLINIC_OR_DEPARTMENT_OTHER)
Admission: RE | Admit: 2015-10-12 | Discharge: 2015-10-12 | Disposition: A | Discharge status: Home / Self Care | Payer: BLUE CROSS/BLUE SHIELD | Source: Ambulatory Visit | Attending: Cardiology | Admitting: Cardiology

## 2015-10-12 ENCOUNTER — Ambulatory Visit (HOSPITAL_COMMUNITY)
Admission: RE | Admit: 2015-10-12 | Discharge: 2015-10-12 | Disposition: A | Discharge status: Home / Self Care | Payer: BLUE CROSS/BLUE SHIELD | Source: Ambulatory Visit | Attending: Cardiology | Admitting: Cardiology

## 2015-10-12 VITALS — BP 111/60 | HR 70 | Resp 18 | Wt 154.5 lb

## 2015-10-12 DIAGNOSIS — C50411 Malignant neoplasm of upper-outer quadrant of right female breast: Secondary | ICD-10-CM | POA: Diagnosis present

## 2015-10-12 DIAGNOSIS — E039 Hypothyroidism, unspecified: Secondary | ICD-10-CM | POA: Diagnosis not present

## 2015-10-12 DIAGNOSIS — T451X5D Adverse effect of antineoplastic and immunosuppressive drugs, subsequent encounter: Secondary | ICD-10-CM

## 2015-10-12 DIAGNOSIS — I427 Cardiomyopathy due to drug and external agent: Secondary | ICD-10-CM

## 2015-10-12 DIAGNOSIS — T451X5A Adverse effect of antineoplastic and immunosuppressive drugs, initial encounter: Principal | ICD-10-CM

## 2015-10-12 MED ORDER — BISOPROLOL FUMARATE 5 MG PO TABS: 2.5000 mg | ORAL_TABLET | Freq: Every day | ORAL | Status: DC

## 2015-10-12 NOTE — Progress Notes (Signed)
  Echocardiogram 2D Echocardiogram has been performed.  Bonnie Ward 10/12/2015, 10:45 AM

## 2015-10-12 NOTE — Progress Notes (Signed)
Patient ID: Bonnie Ward, female   DOB: 1960/09/12, 55 y.o.   MRN: [id number] Oncologist: Dr. Jana Hakim  55 yo with recently-diagnosed triple receptor positive breast cancer presents for cardio-oncology evaluation.  Cancer was diagnosed in 5/16.  She is s/p right partial mastectomy.  Cancer was ER+/PR+/HER2+.  She had chemotherapy with doxorubicin and cyclophosphamide completed 04/16/2015, followed by paclitaxel weekly starting 04/30/2015.  Paclitaxel was stopped due to peripheral neuropathy.  She is currently getting Herceptin every 3 wks.   She is doing well symptomatically.  No exertional dyspnea, able to do spin class without problems.  No lightheadedness or syncope.  No palpitations.  No chest pain.  No orthopnea/PND.   Last echo showed EF down to 50% with lower global longitudinal strain.  Herceptin was stopped and I started her on Coreg 3.125 mg bid.  Coreg made her too fatigued so she stopped it.  I reviewed today's echo: EF 55%, GLS -19.5%.    Labs (12/16): K 4.1, creatinine 0.8  PMH: 1. GERD 2. IBS 3. Recurrent UTIs 4. Breast cancer: ER+/PR+/HER2+.  Diagnosed 5/16, had partial right mastectomy in 6/16.  - Echo (6/16) with EF 55-60%, normal RV, lateral s' 14 cm/sec, GLS -23.3%.  - Doxorubicin/cyclophosphamide until 8/16.  - Paclitaxel until 10/16.  - Herceptin ongoing.  - Echo (12/16) with EF 50%, mild LV dilation, GLS -19.9%.  - Echo (2/17) with EF 55%, lateral s' 9.6, GLS -16.0%, normal diastolic function, normal RV size and systolic function.  5. Neuropathy from paclitaxel.  SH: Quit smoking in 2016, works as Haematologist, rare ETOH.   FH: Uncle with MI in his 24s. Grandfather with MI in his 44s.   ROS: All systems reviewed and negative except as per HPI.   Current Outpatient Prescriptions  Medication Sig Dispense Refill  . bimatoprost (LUMIGAN) 0.03 % ophthalmic solution Place 1 drop into both eyes at bedtime.    . gabapentin (NEURONTIN) 300 MG capsule Take 1 capsule (300  mg total) by mouth at bedtime. 30 capsule 2  . lidocaine-prilocaine (EMLA) cream Reported on 09/23/2015  3  . liothyronine (CYTOMEL) 5 MCG tablet Take 5 mcg by mouth 2 (two) times daily.    Marland Kitchen omeprazole (PRILOSEC) 40 MG capsule Take 1 capsule (40 mg total) by mouth daily. 30 capsule 2  . tamoxifen (NOLVADEX) 20 MG tablet Take 20 mg by mouth daily.    . bisoprolol (ZEBETA) 5 MG tablet Take 0.5 tablets (2.5 mg total) by mouth at bedtime. 15 tablet 3   No current facility-administered medications for this encounter.   BP 111/60 mmHg  Pulse 70  Resp 18  Wt 154 lb 8 oz (70.081 kg)  SpO2 100%  LMP 01/25/2011 General: NAD Neck: No JVD, no thyromegaly or thyroid nodule.  Lungs: Clear to auscultation bilaterally with normal respiratory effort. CV: Nondisplaced PMI.  Heart regular S1/S2, no S3/S4, no murmur.  No peripheral edema.  No carotid bruit.  Normal pedal pulses.  Abdomen: Soft, nontender, no hepatosplenomegaly, no distention.  Skin: Intact without lesions or rashes. Port right upper chest.  Neurologic: Alert and oriented x 3.  Psych: Normal affect. Extremities: No clubbing or cyanosis.  HEENT: Normal.   Assessment/Plan: 55 yo with triple positive breast cancer s/p partial mastectomy.  He had doxorubicin and cyclophosphamide until 8/16 and is now getting Herceptin, planned to continue until 8/17.  Baseline echo prior to treatment was normal.  Last echo showed lower EF and strain.  I was concerned that this was Herceptin cardiotoxicity (prior  doxorubicin may also play a role).   I had her stop Herceptin and start on Coreg.  She was unable to tolerate Coreg so stopped it.  Review of echo today shows EF up to 55% with stable strain at -19.5%.   - I think that we can resume Herceptin with close monitoring.  I will repeat an echo in 6 wks.  If EF worsens, will likely have to stop Herceptin. - Unable to tolerate Coreg.  I will have her try bisoprolol 2.5 mg daily.  I recommended that she take it at  night.   Followup in 6 wks.    Loralie Champagne 10/12/2015

## 2015-10-12 NOTE — Addendum Note (Signed)
Encounter addended by: Scarlette Calico, RN on: 10/12/2015  8:15 AM<BR>     Documentation filed: [employer], Orders

## 2015-10-12 NOTE — Patient Instructions (Signed)
Bisoprolol 2.5 mg (1/2 tab) daily at bedtime  Your physician recommends that you schedule a follow-up appointment in: 6 weeks with echocardiogram

## 2015-10-22 ENCOUNTER — Other Ambulatory Visit: Payer: BLUE CROSS/BLUE SHIELD

## 2015-10-22 ENCOUNTER — Ambulatory Visit (HOSPITAL_BASED_OUTPATIENT_CLINIC_OR_DEPARTMENT_OTHER): Payer: BLUE CROSS/BLUE SHIELD

## 2015-10-22 ENCOUNTER — Telehealth: Payer: Self-pay | Admitting: Nurse Practitioner

## 2015-10-22 ENCOUNTER — Encounter: Payer: Self-pay | Admitting: Nurse Practitioner

## 2015-10-22 ENCOUNTER — Other Ambulatory Visit (HOSPITAL_BASED_OUTPATIENT_CLINIC_OR_DEPARTMENT_OTHER): Payer: BLUE CROSS/BLUE SHIELD

## 2015-10-22 ENCOUNTER — Ambulatory Visit (HOSPITAL_BASED_OUTPATIENT_CLINIC_OR_DEPARTMENT_OTHER): Payer: BLUE CROSS/BLUE SHIELD | Admitting: Nurse Practitioner

## 2015-10-22 VITALS — BP 113/70 | HR 74 | Temp 97.8°F | Resp 18 | Ht 65.5 in | Wt 155.7 lb

## 2015-10-22 VITALS — BP 110/67 | HR 66 | Temp 97.9°F | Resp 18

## 2015-10-22 DIAGNOSIS — Z170 Estrogen receptor positive status [ER+]: Secondary | ICD-10-CM | POA: Diagnosis not present

## 2015-10-22 DIAGNOSIS — Z5112 Encounter for antineoplastic immunotherapy: Secondary | ICD-10-CM | POA: Diagnosis not present

## 2015-10-22 DIAGNOSIS — C50411 Malignant neoplasm of upper-outer quadrant of right female breast: Secondary | ICD-10-CM

## 2015-10-22 DIAGNOSIS — R232 Flushing: Secondary | ICD-10-CM

## 2015-10-22 DIAGNOSIS — R931 Abnormal findings on diagnostic imaging of heart and coronary circulation: Secondary | ICD-10-CM

## 2015-10-22 DIAGNOSIS — N951 Menopausal and female climacteric states: Secondary | ICD-10-CM | POA: Diagnosis not present

## 2015-10-22 LAB — COMPREHENSIVE METABOLIC PANEL
ALBUMIN: 3.7 g/dL (ref 3.5–5.0)
ALK PHOS: 67 U/L (ref 40–150)
ALT: 16 U/L (ref 0–55)
ANION GAP: 9 meq/L (ref 3–11)
AST: 22 U/L (ref 5–34)
BUN: 14.3 mg/dL (ref 7.0–26.0)
CALCIUM: 8.9 mg/dL (ref 8.4–10.4)
CO2: 27 mEq/L (ref 22–29)
Chloride: 103 mEq/L (ref 98–109)
Creatinine: 0.8 mg/dL (ref 0.6–1.1)
EGFR: 81 mL/min/{1.73_m2} — AB (ref >90)
Glucose: 87 mg/dl (ref 70–140)
POTASSIUM: 3.9 meq/L (ref 3.5–5.1)
Sodium: 140 mEq/L (ref 136–145)
Total Bilirubin: 0.54 mg/dL (ref 0.20–1.20)
Total Protein: 7.1 g/dL (ref 6.4–8.3)

## 2015-10-22 LAB — CBC WITH DIFFERENTIAL/PLATELET
BASO%: 0.3 % (ref 0.0–2.0)
BASOS ABS: 0 10*3/uL (ref 0.0–0.1)
EOS%: 2.5 % (ref 0.0–7.0)
Eosinophils Absolute: 0.1 10*3/uL (ref 0.0–0.5)
HEMATOCRIT: 39.1 % (ref 34.8–46.6)
HGB: 13 g/dL (ref 11.6–15.9)
LYMPH#: 1.1 10*3/uL (ref 0.9–3.3)
LYMPH%: 21.4 % (ref 14.0–49.7)
MCH: 31.4 pg (ref 25.1–34.0)
MCHC: 33.3 g/dL (ref 31.5–36.0)
MCV: 94.1 fL (ref 79.5–101.0)
MONO#: 0.5 10*3/uL (ref 0.1–0.9)
MONO%: 10.2 % (ref 0.0–14.0)
NEUT#: 3.3 10*3/uL (ref 1.5–6.5)
NEUT%: 65.6 % (ref 38.4–76.8)
Platelets: 267 10*3/uL (ref 145–400)
RBC: 4.16 10*6/uL (ref 3.70–5.45)
RDW: 16.3 % — AB (ref 11.2–14.5)
WBC: 5 10*3/uL (ref 3.9–10.3)

## 2015-10-22 MED ORDER — SODIUM CHLORIDE 0.9 % IJ SOLN
10.0000 mL | INTRAMUSCULAR | Status: DC | PRN
  Administered 2015-10-22: 10 mL
  Filled 2015-10-22: qty 10

## 2015-10-22 MED ORDER — ACETAMINOPHEN 325 MG PO TABS
650.0000 mg | ORAL_TABLET | Freq: Once | ORAL | Status: AC
  Administered 2015-10-22: 650 mg via ORAL

## 2015-10-22 MED ORDER — SODIUM CHLORIDE 0.9 % IV SOLN
Freq: Once | INTRAVENOUS | Status: AC
  Administered 2015-10-22: 10:00:00 via INTRAVENOUS

## 2015-10-22 MED ORDER — DIPHENHYDRAMINE HCL 25 MG PO CAPS: 25.0000 mg | ORAL_CAPSULE | Freq: Once | ORAL | Status: DC

## 2015-10-22 MED ORDER — HEPARIN SOD (PORK) LOCK FLUSH 100 UNIT/ML IV SOLN
500.0000 [IU] | Freq: Once | INTRAVENOUS | Status: AC | PRN
  Administered 2015-10-22: 500 [IU]
  Filled 2015-10-22: qty 5

## 2015-10-22 MED ORDER — TRASTUZUMAB CHEMO INJECTION 440 MG
6.0000 mg/kg | Freq: Once | INTRAVENOUS | Status: AC
  Administered 2015-10-22: 420 mg via INTRAVENOUS
  Filled 2015-10-22: qty 20

## 2015-10-22 MED ORDER — ACETAMINOPHEN 325 MG PO TABS
ORAL_TABLET | ORAL | Status: AC
  Filled 2015-10-22: qty 2

## 2015-10-22 NOTE — Progress Notes (Signed)
Bonnie Ward  [phone] Fax:(336) [phone]     ID: Bonnie Ward DOB: February 17, 1961  MR#: [id number]  ELT#:[id number]  Patient Care Team: Lollie Sails, MD as PCP - General (Internal Medicine) Milford Cage, OTR Kem Boroughs, FNP as Nurse Practitioner (Nurse Practitioner) Fanny Skates, MD as Consulting Physician (General Surgery) Chauncey Cruel, MD as Consulting Physician (Oncology) Thea Silversmith, MD as Consulting Physician (Radiation Oncology) Rockwell Germany, RN as Registered Nurse Mauro Kaufmann, RN as Registered Nurse Holley Bouche, NP as Nurse Practitioner (Nurse Practitioner) Kristeen Miss, MD as Consulting Physician (Neurosurgery) PCP: Lollie Sails, MD OTHER MD:  CHIEF COMPLAINT: Estrogen receptor positive breast cancer  CURRENT TREATMENT: (trastuzumab); tamoxifen   BREAST CANCER HISTORY: From the initial intake note:   [name] had routine bilateral screening mammography at the [facility] for 20 01/09/2015 showing a possible mass in the right breast. On 12/22/2014 she underwent diagnostic right mammography with tomosynthesis and right breast ultrasonography. The breast density was category C. In the right breast there was an area of increased density with architectural distortion and faint microcalcifications in the upper outer quadrant. There was mild palpable soft tissue thickening in the area in question, but no palpable right axillary lymph nodes. Ultrasound confirmed a 1.7 cm irregular hypoechoic mass. There were no abnormal-appearing right axillary lymph nodes.  Biopsy of the right breast mass in question 12/26/2014 showed ([facility] 16-08/08/2005) an invasive ductal carcinoma, grade 1 or 2, with a prognostic panel still pending.  Her subsequent history is as detailed below  INTERVAL HISTORY: Bonnie Ward returns today for follow-up of her estrogen receptor and HER-2 positive breast cancer. Trastuzumab has been on hold since  late December due to a drop in ejection fraction. She was started on coreg but was not able to tolerate this. Dr. Aundra Dubin recently switched her to bisoprolol which she is tolerating better. Since her last visit she has also started on tamoxifen. Her only issue with this drug is increased frequency of hot flashes, but the intensity is very mild. She takes 38m gabapentin QHS which helps her sleep well.  REVIEW OF SYSTEMS: Bonnie Ward begun working out again and this is improving her energy level. She has some baseline arthritis that is no worse. She has shooting pains to her left foot at night. Her hair is growing back and is black, she dyed it back to her original color. A detailed review of systems is otherwise negative.  PAST MEDICAL HISTORY: Past Medical History  Diagnosis Date  . ACID REFLUX DISEASE 07/12/2010  . Arachnoid cyst     left frontal lobe  . Migraines   . Arthritis     hands  . Nervous stomach   . Difficulty swallowing pills   . Hypothyroidism   . Stress fracture of foot 12/2014    right  . Glaucoma   . Anxiety   . Dental crowns present   . Seasonal allergies   . Complication of anesthesia 2012    states woke up during colonoscopy with Propofol  . [place] 12[place]    right    PAST SURGICAL HISTORY: Past Surgical History  Procedure Laterality Date  . Tonsillectomy  age 55 . Esophagogastroduodenoscopy endoscopy    . Tooth extraction    . Lasik    . Colonoscopy with propofol  10/14/2013  . Radioactive seed guided mastectomy with axillary sentinel lymph node biopsy Right 01/23/2015    Procedure: RIGHT PARTIAL MASTECTOMY WITH RADIOACTIVE SEED LOCALIZATION  WITH RIGHT  AXILLARY SENTINEL LYMPH NODE BIOPSY;  Surgeon: Fanny Skates, MD;  Location: [facility];  Service: General;  Laterality: Right;  . Portacath placement Left 01/23/2015    Procedure: INSERTION PORT-A-CATH ;  Surgeon: Fanny Skates, MD;  Location: [facility];  Service:  General;  Laterality: Left;  . Re-excision of breast lumpectomy Right 02/16/2015    Procedure: RE-EXCISION OF RIGHT  BREAST LUMPECTOMY MARGINS;  Surgeon: Fanny Skates, MD;  Location: [facility];  Service: General;  Laterality: Right;    FAMILY HISTORY Family History  Problem Relation Age of Onset  . Osteoporosis Mother   . Irritable bowel syndrome Mother   . Hypertension Mother   . Hypertension Father   . Hyperlipidemia Father   . Diabetes Father   . Dementia Father   . Colon cancer Maternal Grandfather 78  . Breast cancer Paternal Aunt   . Breast cancer Paternal Grandmother    the patient's father died at age 47 from pneumonia. [name] died at age 31 after bowel perforation. The patient had one brother, one sister. There is no history of breast or ovarian cancer in the family.  GYNECOLOGIC HISTORY:  Patient's last menstrual period was 01/25/2011. Menarche age 58, the patient is GX P0. She stopped having periods approximately 2011. She is still on hormone replacement, but is "trickling off it". She did take birth control pills for approximately 3 years remotely with no complications  SOCIAL HISTORY:  Bonnie Ward works as a Haematologist. She is divorced and lives by herself, with 2 dogs.    ADVANCED DIRECTIVES: The patient's brother [name] is her healthcare power of attorney. He may be reached in [place] at [facility]: Social History  Substance Use Topics  . Smoking status: Former Smoker -- 0.00 packs/day for 0 years    Quit date: 02/19/2005  . Smokeless tobacco: Never Used  . Alcohol Use: Yes     Comment: occasionally     Colonoscopy: February 2016  PAP: June 2015  Bone density: 12/02/2013-- T - 2.2  Lipid panel:  Allergies  Allergen Reactions  . Lortab [Hydrocodone-Acetaminophen] Anxiety  . Milk-Related Compounds Diarrhea    GI UPSET    Current Outpatient Prescriptions  Medication Sig Dispense Refill  .  bimatoprost (LUMIGAN) 0.03 % ophthalmic solution Place 1 drop into both eyes at bedtime.    . bisoprolol (ZEBETA) 5 MG tablet Take 0.5 tablets (2.5 mg total) by mouth at bedtime. 15 tablet 3  . gabapentin (NEURONTIN) 300 MG capsule Take 1 capsule (300 mg total) by mouth at bedtime. 30 capsule 2  . lidocaine-prilocaine (EMLA) cream Reported on 09/23/2015  3  . liothyronine (CYTOMEL) 5 MCG tablet Take 5 mcg by mouth 2 (two) times daily.    Marland Kitchen omeprazole (PRILOSEC) 40 MG capsule Take 1 capsule (40 mg total) by mouth daily. 30 capsule 2  . tamoxifen (NOLVADEX) 20 MG tablet Take 20 mg by mouth daily.     No current facility-administered medications for this visit.   Facility-Administered Medications Ordered in Other Visits  Medication Dose Route Frequency Provider Last Rate Last Dose  . diphenhydrAMINE (BENADRYL) capsule 25 mg  25 mg Oral Once Chauncey Cruel, MD   25 mg at 10/22/15 1000  . heparin lock flush 100 unit/mL  500 Units Intracatheter Once PRN Chauncey Cruel, MD      . sodium chloride 0.9 % injection 10 mL  10 mL Intracatheter PRN Chauncey Cruel, MD      .  trastuzumab (HERCEPTIN) 420 mg in sodium chloride 0.9 % 250 mL chemo infusion  6 mg/kg (Treatment Plan Actual) Intravenous Once Chauncey Cruel, MD        OBJECTIVE: Middle-aged white woman in no acute distress Filed Vitals:   10/22/15 0858  BP: 113/70  Pulse: 74  Temp: 97.8 F (36.6 C)  Resp: 18     Body mass index is 25.51 kg/(m^2).    ECOG FS:1 - Symptomatic but completely ambulatory  Skin: warm, dry  HEENT: sclerae anicteric, conjunctivae pink, oropharynx clear. No thrush or mucositis.  Lymph Nodes: No cervical or supraclavicular lymphadenopathy  Lungs: clear to auscultation bilaterally, no rales, wheezes, or rhonci  Heart: regular rate and rhythm  Abdomen: round, soft, non tender, positive bowel sounds  Musculoskeletal: No focal spinal tenderness, no peripheral edema  Neuro: non focal, well oriented, positive  affect  Breasts: deferred  LAB RESULTS:  CMP     Component Value Date/Time   NA 140 10/22/2015 0849   NA 140 01/21/2009 0828   K 3.9 10/22/2015 0849   K 4.1 01/21/2009 0828   CL 108 01/21/2009 0828   CO2 27 10/22/2015 0849   CO2 30 01/21/2009 0828   GLUCOSE 87 10/22/2015 0849   GLUCOSE 90 01/21/2009 0828   BUN 14.3 10/22/2015 0849   BUN 13 01/21/2009 0828   CREATININE 0.8 10/22/2015 0849   CREATININE 0.7 01/21/2009 0828   CALCIUM 8.9 10/22/2015 0849   CALCIUM 8.5 01/21/2009 0828   PROT 7.1 10/22/2015 0849   PROT 6.9 01/21/2009 0828   ALBUMIN 3.7 10/22/2015 0849   ALBUMIN 3.7 01/21/2009 0828   AST 22 10/22/2015 0849   AST 22 01/21/2009 0828   ALT 16 10/22/2015 0849   ALT 19 01/21/2009 0828   ALKPHOS 67 10/22/2015 0849   ALKPHOS 43 01/21/2009 0828   BILITOT 0.54 10/22/2015 0849   BILITOT 1.1 01/21/2009 0828   GFRNONAA 95.01 01/21/2009 0828    INo results found for: SPEP, UPEP  Lab Results  Component Value Date   WBC 5.0 10/22/2015   NEUTROABS 3.3 10/22/2015   HGB 13.0 10/22/2015   HCT 39.1 10/22/2015   MCV 94.1 10/22/2015   PLT 267 10/22/2015      Chemistry      Component Value Date/Time   NA 140 10/22/2015 0849   NA 140 01/21/2009 0828   K 3.9 10/22/2015 0849   K 4.1 01/21/2009 0828   CL 108 01/21/2009 0828   CO2 27 10/22/2015 0849   CO2 30 01/21/2009 0828   BUN 14.3 10/22/2015 0849   BUN 13 01/21/2009 0828   CREATININE 0.8 10/22/2015 0849   CREATININE 0.7 01/21/2009 0828      Component Value Date/Time   CALCIUM 8.9 10/22/2015 0849   CALCIUM 8.5 01/21/2009 0828   ALKPHOS 67 10/22/2015 0849   ALKPHOS 43 01/21/2009 0828   AST 22 10/22/2015 0849   AST 22 01/21/2009 0828   ALT 16 10/22/2015 0849   ALT 19 01/21/2009 0828   BILITOT 0.54 10/22/2015 0849   BILITOT 1.1 01/21/2009 0828       No results found for: LABCA2  No components found for: LABCA125  No results for input(s): INR in the last 168 hours.  Urinalysis    Component Value  Date/Time   COLORURINE yellow 10/05/2009 1454   APPEARANCEUR Clear 10/05/2009 1454   LABSPEC 1.010 10/05/2009 1454   PHURINE 7.0 10/05/2009 1454   HGBUR 2+ 10/05/2009 1454   BILIRUBINUR neg 04/13/2015 1605  BILIRUBINUR negative 10/05/2009 1454   PROTEINUR neg 04/13/2015 1605   UROBILINOGEN negative 04/13/2015 1605   UROBILINOGEN 0.2 10/05/2009 1454   NITRITE neg 04/13/2015 1605   NITRITE negative 10/05/2009 1454   LEUKOCYTESUR Negative 04/13/2015 1605    STUDIES: No results found.  ASSESSMENT: 55 y.o. Bonnie Ward woman status post right breast biopsy 12/26/2014 for a clinical T1 cN0, stage IA invasive ductal carcinoma, grade 1 or 2, estrogen and progesterone receptor positive, with an MIB-1 of 11%, and HER-2 amplified, with a signals ratio of 2.07, number per cell 2.28.  (1) status post right lumpectomy and sentinel lymph node sampling 01/23/2015 for a pT1c pN1, stage IIA invasive ductal carcinoma, grade 1, with focally positive anterior margins  (a) additional surgery 02/16/2015 found residual disease but cleared margins  (2) chemotherapy started 03/05/2015, consisting of doxorubicin and cyclophosphamide in dose dense fashion 4, completed 04/16/2015, followed by paclitaxel weekly x 12 planned cycles, started 04/30/2015,  with trastuzumab and pertuzumab given every 3 weeks.  (a) Paclitaxel stopped after 4 cycles because of progressive neuropathy, last dose 05/28/2015  (b) pertuzumab stopped after 2 cycles (when paclitaxel stopped)  (3) trastuzumab Ward be continued to complete 1 year (through August 2017)  (a) echocardiogram 08/20/2015 shows a >10% drop-- trastuzumab held after 07/30/2015 dose  (4) adjuvant radiation 07/06/2015-08/21/2015::  Right breast / 45 Gray @ 1.8 Pearline Cables per fraction x 25 fractions Right supraclavicular fossa/PAB 45 Gy '@1' .8 Gy per fraction x 25 fractions Right breast boost / 16 Gray at [employer] per fraction x 8 fractions  (5) started tamoxifen  09/10/2015  PLAN: Bonnie Ward is doing well so far on tamoxifen. She plans to stick with it. The hot flashes are annoying, but she prefers not to add anything to the 327m gabapentin QHS that she is already on.   We reviewed the results of her echo. Per Dr. MMarcial Pacaslast progress note, we are to reinstate the trastuzumab as planned today. She Ward have a repeat echo in 6 weeks. If there is another drop in EF we may discontinue the drug altogether.   Bonnie Ward return in late May for a follow up visit. If she receives instructions to stop the trastuzumab she Ward give [facility] call. She understands and agrees with this plan. She knows the goal of treatment in her case is cure. She has been encouraged to call with any issues that might arise before her next visit here.    HLaurie Panda NP   10/22/2015 10:21 AM

## 2015-10-22 NOTE — Telephone Encounter (Signed)
appt made and avs printed °

## 2015-10-22 NOTE — Patient Instructions (Signed)
[facility] Discharge Instructions for Patients Receiving Chemotherapy  Today you received the following chemotherapy agents: Herceptin   To help prevent nausea and vomiting after your treatment, we encourage you to take your nausea medication as directed.    If you develop nausea and vomiting that is not controlled by your nausea medication, call the clinic.   BELOW ARE SYMPTOMS THAT SHOULD BE REPORTED IMMEDIATELY:  *FEVER GREATER THAN 100.5 F  *CHILLS WITH OR WITHOUT FEVER  NAUSEA AND VOMITING THAT IS NOT CONTROLLED WITH YOUR NAUSEA MEDICATION  *UNUSUAL SHORTNESS OF BREATH  *UNUSUAL BRUISING OR BLEEDING  TENDERNESS IN MOUTH AND THROAT WITH OR WITHOUT PRESENCE OF ULCERS  *URINARY PROBLEMS  *BOWEL PROBLEMS  UNUSUAL RASH Items with * indicate a potential emergency and should be followed up as soon as possible.  Feel free to call the clinic you have any questions or concerns. The clinic phone number is [phone].  Please show the CHEMO ALERT CARD at check-in to the Emergency Department and triage nurse.   

## 2015-11-09 ENCOUNTER — Ambulatory Visit (HOSPITAL_BASED_OUTPATIENT_CLINIC_OR_DEPARTMENT_OTHER): Payer: BLUE CROSS/BLUE SHIELD

## 2015-11-09 ENCOUNTER — Other Ambulatory Visit (HOSPITAL_BASED_OUTPATIENT_CLINIC_OR_DEPARTMENT_OTHER): Payer: BLUE CROSS/BLUE SHIELD

## 2015-11-09 ENCOUNTER — Other Ambulatory Visit: Payer: Self-pay | Admitting: Nurse Practitioner

## 2015-11-09 VITALS — BP 102/59 | HR 67 | Temp 97.1°F | Resp 19

## 2015-11-09 DIAGNOSIS — C50411 Malignant neoplasm of upper-outer quadrant of right female breast: Secondary | ICD-10-CM

## 2015-11-09 DIAGNOSIS — Z5112 Encounter for antineoplastic immunotherapy: Secondary | ICD-10-CM

## 2015-11-09 LAB — CBC WITH DIFFERENTIAL/PLATELET
BASO%: 0.2 % (ref 0.0–2.0)
Basophils Absolute: 0 10*3/uL (ref 0.0–0.1)
EOS%: 2.4 % (ref 0.0–7.0)
Eosinophils Absolute: 0.2 10*3/uL (ref 0.0–0.5)
HCT: 35 % (ref 34.8–46.6)
HEMOGLOBIN: 11.6 g/dL (ref 11.6–15.9)
LYMPH#: 1.3 10*3/uL (ref 0.9–3.3)
LYMPH%: 19.7 % (ref 14.0–49.7)
MCH: 31.4 pg (ref 25.1–34.0)
MCHC: 33.1 g/dL (ref 31.5–36.0)
MCV: 94.6 fL (ref 79.5–101.0)
MONO#: 0.4 10*3/uL (ref 0.1–0.9)
MONO%: 6 % (ref 0.0–14.0)
NEUT#: 4.5 10*3/uL (ref 1.5–6.5)
NEUT%: 71.7 % (ref 38.4–76.8)
Platelets: 242 10*3/uL (ref 145–400)
RBC: 3.7 10*6/uL (ref 3.70–5.45)
RDW: 14.7 % — AB (ref 11.2–14.5)
WBC: 6.3 10*3/uL (ref 3.9–10.3)

## 2015-11-09 LAB — COMPREHENSIVE METABOLIC PANEL
ALBUMIN: 3.4 g/dL — AB (ref 3.5–5.0)
ALK PHOS: 67 U/L (ref 40–150)
ALT: 16 U/L (ref 0–55)
AST: 19 U/L (ref 5–34)
Anion Gap: 6 mEq/L (ref 3–11)
BUN: 17.6 mg/dL (ref 7.0–26.0)
CO2: 28 mEq/L (ref 22–29)
Calcium: 8.8 mg/dL (ref 8.4–10.4)
Chloride: 105 mEq/L (ref 98–109)
Creatinine: 0.9 mg/dL (ref 0.6–1.1)
EGFR: 72 mL/min/{1.73_m2} — AB (ref >90)
GLUCOSE: 123 mg/dL (ref 70–140)
POTASSIUM: 4.6 meq/L (ref 3.5–5.1)
SODIUM: 139 meq/L (ref 136–145)
Total Bilirubin: 0.3 mg/dL (ref 0.20–1.20)
Total Protein: 6.7 g/dL (ref 6.4–8.3)

## 2015-11-09 MED ORDER — SODIUM CHLORIDE 0.9 % IJ SOLN
10.0000 mL | INTRAMUSCULAR | Status: DC | PRN
  Administered 2015-11-09: 10 mL
  Filled 2015-11-09: qty 10

## 2015-11-09 MED ORDER — ACETAMINOPHEN 325 MG PO TABS
650.0000 mg | ORAL_TABLET | Freq: Once | ORAL | Status: AC
  Administered 2015-11-09: 650 mg via ORAL

## 2015-11-09 MED ORDER — HEPARIN SOD (PORK) LOCK FLUSH 100 UNIT/ML IV SOLN
500.0000 [IU] | Freq: Once | INTRAVENOUS | Status: AC | PRN
  Administered 2015-11-09: 500 [IU]
  Filled 2015-11-09: qty 5

## 2015-11-09 MED ORDER — DIPHENHYDRAMINE HCL 25 MG PO CAPS: 25.0000 mg | ORAL_CAPSULE | Freq: Once | ORAL | Status: DC

## 2015-11-09 MED ORDER — SODIUM CHLORIDE 0.9 % IV SOLN
Freq: Once | INTRAVENOUS | Status: AC
  Administered 2015-11-09: 15:00:00 via INTRAVENOUS

## 2015-11-09 MED ORDER — SODIUM CHLORIDE 0.9 % IV SOLN
6.0000 mg/kg | Freq: Once | INTRAVENOUS | Status: AC
  Administered 2015-11-09: 420 mg via INTRAVENOUS
  Filled 2015-11-09: qty 20

## 2015-11-09 MED ORDER — ACETAMINOPHEN 325 MG PO TABS
ORAL_TABLET | ORAL | Status: AC
  Filled 2015-11-09: qty 2

## 2015-11-09 NOTE — Patient Instructions (Signed)
[facility] Discharge Instructions for Patients Receiving Chemotherapy  Today you received the following chemotherapy agents: Herceptin   To help prevent nausea and vomiting after your treatment, we encourage you to take your nausea medication as directed.    If you develop nausea and vomiting that is not controlled by your nausea medication, call the clinic.   BELOW ARE SYMPTOMS THAT SHOULD BE REPORTED IMMEDIATELY:  *FEVER GREATER THAN 100.5 F  *CHILLS WITH OR WITHOUT FEVER  NAUSEA AND VOMITING THAT IS NOT CONTROLLED WITH YOUR NAUSEA MEDICATION  *UNUSUAL SHORTNESS OF BREATH  *UNUSUAL BRUISING OR BLEEDING  TENDERNESS IN MOUTH AND THROAT WITH OR WITHOUT PRESENCE OF ULCERS  *URINARY PROBLEMS  *BOWEL PROBLEMS  UNUSUAL RASH Items with * indicate a potential emergency and should be followed up as soon as possible.  Feel free to call the clinic you have any questions or concerns. The clinic phone number is [phone].  Please show the CHEMO ALERT CARD at check-in to the Emergency Department and triage nurse.   

## 2015-11-09 NOTE — Progress Notes (Signed)
Pt has declined to take Benadryl today.

## 2015-11-12 ENCOUNTER — Other Ambulatory Visit: Payer: BLUE CROSS/BLUE SHIELD

## 2015-11-12 ENCOUNTER — Ambulatory Visit: Payer: BLUE CROSS/BLUE SHIELD

## 2015-11-23 ENCOUNTER — Ambulatory Visit: Payer: BLUE CROSS/BLUE SHIELD

## 2015-11-25 ENCOUNTER — Ambulatory Visit (HOSPITAL_COMMUNITY)
Admission: RE | Admit: 2015-11-25 | Discharge: 2015-11-25 | Disposition: A | Discharge status: Home / Self Care | Payer: BLUE CROSS/BLUE SHIELD | Source: Ambulatory Visit | Attending: Cardiology | Admitting: Cardiology

## 2015-11-25 ENCOUNTER — Other Ambulatory Visit (HOSPITAL_COMMUNITY): Payer: Self-pay | Admitting: Internal Medicine

## 2015-11-25 ENCOUNTER — Ambulatory Visit (HOSPITAL_BASED_OUTPATIENT_CLINIC_OR_DEPARTMENT_OTHER)
Admission: RE | Admit: 2015-11-25 | Discharge: 2015-11-25 | Disposition: A | Discharge status: Home / Self Care | Payer: BLUE CROSS/BLUE SHIELD | Source: Ambulatory Visit | Attending: Cardiology | Admitting: Cardiology

## 2015-11-25 ENCOUNTER — Other Ambulatory Visit (HOSPITAL_COMMUNITY): Payer: Self-pay | Admitting: *Deleted

## 2015-11-25 VITALS — BP 100/60 | HR 73 | Wt 157.0 lb

## 2015-11-25 DIAGNOSIS — T451X5A Adverse effect of antineoplastic and immunosuppressive drugs, initial encounter: Principal | ICD-10-CM

## 2015-11-25 DIAGNOSIS — C50411 Malignant neoplasm of upper-outer quadrant of right female breast: Secondary | ICD-10-CM | POA: Diagnosis not present

## 2015-11-25 DIAGNOSIS — K589 Irritable bowel syndrome without diarrhea: Secondary | ICD-10-CM | POA: Diagnosis not present

## 2015-11-25 DIAGNOSIS — Z87891 Personal history of nicotine dependence: Secondary | ICD-10-CM | POA: Insufficient documentation

## 2015-11-25 DIAGNOSIS — I427 Cardiomyopathy due to drug and external agent: Secondary | ICD-10-CM | POA: Diagnosis not present

## 2015-11-25 DIAGNOSIS — C50911 Malignant neoplasm of unspecified site of right female breast: Secondary | ICD-10-CM | POA: Insufficient documentation

## 2015-11-25 DIAGNOSIS — T451X5D Adverse effect of antineoplastic and immunosuppressive drugs, subsequent encounter: Secondary | ICD-10-CM | POA: Diagnosis not present

## 2015-11-25 DIAGNOSIS — Z9221 Personal history of antineoplastic chemotherapy: Secondary | ICD-10-CM | POA: Insufficient documentation

## 2015-11-25 DIAGNOSIS — Z170 Estrogen receptor positive status [ER+]: Secondary | ICD-10-CM | POA: Insufficient documentation

## 2015-11-25 DIAGNOSIS — Z79899 Other long term (current) drug therapy: Secondary | ICD-10-CM | POA: Diagnosis not present

## 2015-11-25 DIAGNOSIS — K219 Gastro-esophageal reflux disease without esophagitis: Secondary | ICD-10-CM | POA: Diagnosis not present

## 2015-11-25 NOTE — Progress Notes (Signed)
Patient ID: Bonnie Ward, female   DOB: [birth year], 55 y.o.   MRN: [id number] Oncologist: Dr. Jana Hakim  55 yo with recently-diagnosed triple receptor positive breast cancer presents for cardio-oncology evaluation.  Cancer was diagnosed in 5/16.  She is s/p right partial mastectomy.  Cancer was ER+/PR+/HER2+.  She had chemotherapy with doxorubicin and cyclophosphamide completed 04/16/2015, followed by paclitaxel weekly starting 04/30/2015.  Paclitaxel was stopped due to peripheral neuropathy.  Herceptin held after 12/16 echo then restarted.  She is currently getting Herceptin every 3 wks.   She is doing well symptomatically.  Last visit bisoprolol was started. Denies SOB/orthopnea. Does complain of fatigue. Working full time as a Emergency planning/management officer. Tries to exercise.   Labs (12/16): K 4.1, creatinine 0.8  PMH: 1. GERD 2. IBS 3. Recurrent UTIs 4. Breast cancer: ER+/PR+/HER2+.  Diagnosed 5/16, had partial right mastectomy in 6/16.  - Echo (6/16) with EF 55-60%, normal RV, lateral s' 14 cm/sec, GLS -23.3%.  - Doxorubicin/cyclophosphamide until 8/16.  - Paclitaxel until 10/16.  - Herceptin ongoing.  - Echo (12/16) with EF 50%, mild LV dilation, GLS -19.9%.  - Echo (2/17) with EF 55%, lateral s' 9.6, GLS -26.7%, normal diastolic function, normal RV size and systolic function.  - Echo (3/17) with EF 55%, lateral s' 11.9, GLS -12.4%, normal diastolic function, normal RV size and systolic function.  5. Neuropathy from paclitaxel.  SH: Quit smoking in 2016, works as Haematologist, rare ETOH.   FH: Uncle with MI in his 90s. Grandfather with MI in his 54s.   ROS: All systems reviewed and negative except as per HPI.   Current Outpatient Prescriptions  Medication Sig Dispense Refill  . bimatoprost (LUMIGAN) 0.03 % ophthalmic solution Place 1 drop into both eyes at bedtime.    . bisoprolol (ZEBETA) 5 MG tablet Take 0.5 tablets (2.5 mg total) by mouth at bedtime. 15 tablet 3  . gabapentin (NEURONTIN) 300 MG  capsule Take 1 capsule (300 mg total) by mouth at bedtime. 30 capsule 2  . lidocaine-prilocaine (EMLA) cream Reported on 09/23/2015  3  . liothyronine (CYTOMEL) 5 MCG tablet Take 5 mcg by mouth 2 (two) times daily.    Marland Kitchen omeprazole (PRILOSEC) 40 MG capsule Take 1 capsule (40 mg total) by mouth daily. 30 capsule 2  . tamoxifen (NOLVADEX) 20 MG tablet Take 20 mg by mouth daily.     No current facility-administered medications for this encounter.   BP 100/60 mmHg  Pulse 73  Wt 157 lb (71.215 kg)  SpO2 97%  LMP 01/25/2011 General: NAD Neck: No JVD, no thyromegaly or thyroid nodule.  Lungs: Clear to auscultation bilaterally with normal respiratory effort. CV: Nondisplaced PMI.  Heart regular S1/S2, no S3/S4, no murmur.  No peripheral edema.  No carotid bruit.  Normal pedal pulses.  Abdomen: Soft, nontender, no hepatosplenomegaly, no distention.  Skin: Intact without lesions or rashes. Port right upper chest.  Neurologic: Alert and oriented x 3.  Psych: Normal affect. Extremities: No clubbing or cyanosis.  HEENT: Normal.   Assessment/Plan: 55 yo with triple positive breast cancer s/p partial mastectomy.  She had doxorubicin and cyclophosphamide until 8/16 and is now getting Herceptin, planned to continue until 8/17.  Baseline echo prior to treatment was normal.  Intolerant Coreg due to fatigue but ok on 2.5 mg bisoprolol. Herceptin was held after 12/16 echo but now back on Herceptin. Today ECHO was discussed and reviewed by Dr Aundra Dubin and appears stable.   Follow up in 3 months.  Bonnie Clegg NP-C 11/25/2015   Patient seen with NP, agree with the above note.  She is now on bisoprolol, and Herceptin was restarted after 2/17 echo.  Today's echo was reviewed.  EF, lateral s', and global longitudinal strain are all normal/stable.  She will continue Herceptin. I will get next echo in 3 months with office appt.  I will have her continue bisoprolol.   Bonnie Ward 11/26/2015

## 2015-11-25 NOTE — Progress Notes (Signed)
Echocardiogram 2D Echocardiogram limited has been performed.  Bonnie Ward 11/25/2015, 2:51 PM

## 2015-11-25 NOTE — Patient Instructions (Signed)
Folow up and Echo in 3 months with Dr.McLean

## 2015-11-26 ENCOUNTER — Telehealth: Payer: Self-pay | Admitting: Oncology

## 2015-11-26 NOTE — Telephone Encounter (Signed)
returned call and lvm for pt of what the new date will be if she moves appts to 4.17....advised pt to call back to confirm if she still wanted to r/s

## 2015-11-30 ENCOUNTER — Ambulatory Visit: Payer: BLUE CROSS/BLUE SHIELD

## 2015-11-30 ENCOUNTER — Other Ambulatory Visit: Payer: BLUE CROSS/BLUE SHIELD

## 2015-12-02 ENCOUNTER — Ambulatory Visit (HOSPITAL_BASED_OUTPATIENT_CLINIC_OR_DEPARTMENT_OTHER): Payer: BLUE CROSS/BLUE SHIELD

## 2015-12-02 ENCOUNTER — Other Ambulatory Visit (HOSPITAL_BASED_OUTPATIENT_CLINIC_OR_DEPARTMENT_OTHER): Payer: BLUE CROSS/BLUE SHIELD

## 2015-12-02 VITALS — BP 103/69 | HR 73 | Temp 98.4°F | Resp 18

## 2015-12-02 DIAGNOSIS — C50411 Malignant neoplasm of upper-outer quadrant of right female breast: Secondary | ICD-10-CM

## 2015-12-02 DIAGNOSIS — Z5112 Encounter for antineoplastic immunotherapy: Secondary | ICD-10-CM

## 2015-12-02 LAB — CBC WITH DIFFERENTIAL/PLATELET
BASO%: 0.2 % (ref 0.0–2.0)
Basophils Absolute: 0 10*3/uL (ref 0.0–0.1)
EOS ABS: 0.1 10*3/uL (ref 0.0–0.5)
EOS%: 1.5 % (ref 0.0–7.0)
HEMATOCRIT: 35.3 % (ref 34.8–46.6)
HGB: 11.9 g/dL (ref 11.6–15.9)
LYMPH#: 1.3 10*3/uL (ref 0.9–3.3)
LYMPH%: 19.1 % (ref 14.0–49.7)
MCH: 31.9 pg (ref 25.1–34.0)
MCHC: 33.7 g/dL (ref 31.5–36.0)
MCV: 94.6 fL (ref 79.5–101.0)
MONO#: 0.6 10*3/uL (ref 0.1–0.9)
MONO%: 9.2 % (ref 0.0–14.0)
NEUT%: 70 % (ref 38.4–76.8)
NEUTROS ABS: 4.6 10*3/uL (ref 1.5–6.5)
Platelets: 249 10*3/uL (ref 145–400)
RBC: 3.73 10*6/uL (ref 3.70–5.45)
RDW: 14.1 % (ref 11.2–14.5)
WBC: 6.6 10*3/uL (ref 3.9–10.3)

## 2015-12-02 LAB — COMPREHENSIVE METABOLIC PANEL
ALT: 13 U/L (ref 0–55)
AST: 20 U/L (ref 5–34)
Albumin: 3.5 g/dL (ref 3.5–5.0)
Alkaline Phosphatase: 87 U/L (ref 40–150)
Anion Gap: 8 mEq/L (ref 3–11)
BUN: 17.3 mg/dL (ref 7.0–26.0)
CHLORIDE: 105 meq/L (ref 98–109)
CO2: 25 meq/L (ref 22–29)
CREATININE: 0.9 mg/dL (ref 0.6–1.1)
Calcium: 9 mg/dL (ref 8.4–10.4)
EGFR: 73 mL/min/{1.73_m2} — ABNORMAL LOW (ref >90)
Glucose: 124 mg/dl (ref 70–140)
POTASSIUM: 4.3 meq/L (ref 3.5–5.1)
SODIUM: 138 meq/L (ref 136–145)
Total Bilirubin: 0.37 mg/dL (ref 0.20–1.20)
Total Protein: 6.8 g/dL (ref 6.4–8.3)

## 2015-12-02 MED ORDER — SODIUM CHLORIDE 0.9 % IV SOLN
Freq: Once | INTRAVENOUS | Status: AC
  Administered 2015-12-02: 15:00:00 via INTRAVENOUS

## 2015-12-02 MED ORDER — SODIUM CHLORIDE 0.9 % IJ SOLN
10.0000 mL | INTRAMUSCULAR | Status: DC | PRN
  Administered 2015-12-02: 10 mL
  Filled 2015-12-02: qty 10

## 2015-12-02 MED ORDER — HEPARIN SOD (PORK) LOCK FLUSH 100 UNIT/ML IV SOLN
500.0000 [IU] | Freq: Once | INTRAVENOUS | Status: AC | PRN
  Administered 2015-12-02: 500 [IU]
  Filled 2015-12-02: qty 5

## 2015-12-02 MED ORDER — ACETAMINOPHEN 325 MG PO TABS: 650.0000 mg | ORAL_TABLET | Freq: Once | ORAL | Status: DC

## 2015-12-02 MED ORDER — DIPHENHYDRAMINE HCL 25 MG PO CAPS: 25.0000 mg | ORAL_CAPSULE | Freq: Once | ORAL | Status: DC

## 2015-12-02 MED ORDER — TRASTUZUMAB CHEMO INJECTION 440 MG
6.0000 mg/kg | Freq: Once | INTRAVENOUS | Status: AC
  Administered 2015-12-02: 420 mg via INTRAVENOUS
  Filled 2015-12-02: qty 20

## 2015-12-02 NOTE — Patient Instructions (Signed)
[facility] Discharge Instructions for Patients Receiving Chemotherapy  Today you received the following chemotherapy agents:  Herceptin  To help prevent nausea and vomiting after your treatment, we encourage you to take your nausea medication as prescribed.   If you develop nausea and vomiting that is not controlled by your nausea medication, call the clinic.   BELOW ARE SYMPTOMS THAT SHOULD BE REPORTED IMMEDIATELY:  *FEVER GREATER THAN 100.5 F  *CHILLS WITH OR WITHOUT FEVER  NAUSEA AND VOMITING THAT IS NOT CONTROLLED WITH YOUR NAUSEA MEDICATION  *UNUSUAL SHORTNESS OF BREATH  *UNUSUAL BRUISING OR BLEEDING  TENDERNESS IN MOUTH AND THROAT WITH OR WITHOUT PRESENCE OF ULCERS  *URINARY PROBLEMS  *BOWEL PROBLEMS  UNUSUAL RASH Items with * indicate a potential emergency and should be followed up as soon as possible.  Feel free to call the clinic you have any questions or concerns. The clinic phone number is [phone].  Please show the CHEMO ALERT CARD at check-in to the Emergency Department and triage nurse.   

## 2015-12-21 ENCOUNTER — Other Ambulatory Visit: Payer: Self-pay | Admitting: Oncology

## 2015-12-21 ENCOUNTER — Other Ambulatory Visit (HOSPITAL_BASED_OUTPATIENT_CLINIC_OR_DEPARTMENT_OTHER): Payer: BLUE CROSS/BLUE SHIELD

## 2015-12-21 ENCOUNTER — Ambulatory Visit (HOSPITAL_BASED_OUTPATIENT_CLINIC_OR_DEPARTMENT_OTHER): Payer: BLUE CROSS/BLUE SHIELD

## 2015-12-21 VITALS — BP 129/80 | HR 69 | Temp 97.9°F | Resp 16

## 2015-12-21 DIAGNOSIS — C50411 Malignant neoplasm of upper-outer quadrant of right female breast: Secondary | ICD-10-CM

## 2015-12-21 DIAGNOSIS — Z5112 Encounter for antineoplastic immunotherapy: Secondary | ICD-10-CM

## 2015-12-21 LAB — CBC WITH DIFFERENTIAL/PLATELET
BASO%: 0.2 % (ref 0.0–2.0)
Basophils Absolute: 0 10*3/uL (ref 0.0–0.1)
EOS%: 2.4 % (ref 0.0–7.0)
Eosinophils Absolute: 0.1 10*3/uL (ref 0.0–0.5)
HCT: 34.6 % — ABNORMAL LOW (ref 34.8–46.6)
HGB: 11.7 g/dL (ref 11.6–15.9)
LYMPH%: 28.2 % (ref 14.0–49.7)
MCH: 31.7 pg (ref 25.1–34.0)
MCHC: 33.8 g/dL (ref 31.5–36.0)
MCV: 93.8 fL (ref 79.5–101.0)
MONO#: 0.4 10*3/uL (ref 0.1–0.9)
MONO%: 8.3 % (ref 0.0–14.0)
NEUT%: 60.9 % (ref 38.4–76.8)
NEUTROS ABS: 3.1 10*3/uL (ref 1.5–6.5)
Platelets: 229 10*3/uL (ref 145–400)
RBC: 3.69 10*6/uL — AB (ref 3.70–5.45)
RDW: 13.9 % (ref 11.2–14.5)
WBC: 5.1 10*3/uL (ref 3.9–10.3)
lymph#: 1.4 10*3/uL (ref 0.9–3.3)

## 2015-12-21 LAB — COMPREHENSIVE METABOLIC PANEL
ALT: 17 U/L (ref 0–55)
ANION GAP: 7 meq/L (ref 3–11)
AST: 25 U/L (ref 5–34)
Albumin: 3.4 g/dL — ABNORMAL LOW (ref 3.5–5.0)
Alkaline Phosphatase: 78 U/L (ref 40–150)
BUN: 16.3 mg/dL (ref 7.0–26.0)
CHLORIDE: 106 meq/L (ref 98–109)
CO2: 26 meq/L (ref 22–29)
Calcium: 8.8 mg/dL (ref 8.4–10.4)
Creatinine: 0.9 mg/dL (ref 0.6–1.1)
EGFR: 72 mL/min/{1.73_m2} — AB (ref >90)
GLUCOSE: 99 mg/dL (ref 70–140)
Potassium: 4.1 mEq/L (ref 3.5–5.1)
SODIUM: 138 meq/L (ref 136–145)
TOTAL PROTEIN: 6.5 g/dL (ref 6.4–8.3)

## 2015-12-21 MED ORDER — ACETAMINOPHEN 325 MG PO TABS
650.0000 mg | ORAL_TABLET | Freq: Once | ORAL | Status: AC
  Administered 2015-12-21: 650 mg via ORAL

## 2015-12-21 MED ORDER — DIPHENHYDRAMINE HCL 25 MG PO CAPS: 25.0000 mg | ORAL_CAPSULE | Freq: Once | ORAL | Status: DC

## 2015-12-21 MED ORDER — ACETAMINOPHEN 325 MG PO TABS
ORAL_TABLET | ORAL | Status: AC
  Filled 2015-12-21: qty 2

## 2015-12-21 MED ORDER — SODIUM CHLORIDE 0.9 % IJ SOLN
10.0000 mL | INTRAMUSCULAR | Status: DC | PRN
  Administered 2015-12-21: 10 mL
  Filled 2015-12-21: qty 10

## 2015-12-21 MED ORDER — SODIUM CHLORIDE 0.9 % IV SOLN
Freq: Once | INTRAVENOUS | Status: AC
  Administered 2015-12-21: 14:00:00 via INTRAVENOUS

## 2015-12-21 MED ORDER — HEPARIN SOD (PORK) LOCK FLUSH 100 UNIT/ML IV SOLN
500.0000 [IU] | Freq: Once | INTRAVENOUS | Status: AC | PRN
  Administered 2015-12-21: 500 [IU]
  Filled 2015-12-21: qty 5

## 2015-12-21 MED ORDER — TRASTUZUMAB CHEMO INJECTION 440 MG
6.0000 mg/kg | Freq: Once | INTRAVENOUS | Status: AC
  Administered 2015-12-21: 420 mg via INTRAVENOUS
  Filled 2015-12-21: qty 20

## 2015-12-21 NOTE — Patient Instructions (Signed)
[facility] Discharge Instructions for Patients Receiving Chemotherapy  Today you received the following chemotherapy agents Herceptin  To help prevent nausea and vomiting after your treatment, we encourage you to take your nausea medication    If you develop nausea and vomiting that is not controlled by your nausea medication, call the clinic.   BELOW ARE SYMPTOMS THAT SHOULD BE REPORTED IMMEDIATELY:  *FEVER GREATER THAN 100.5 F  *CHILLS WITH OR WITHOUT FEVER  NAUSEA AND VOMITING THAT IS NOT CONTROLLED WITH YOUR NAUSEA MEDICATION  *UNUSUAL SHORTNESS OF BREATH  *UNUSUAL BRUISING OR BLEEDING  TENDERNESS IN MOUTH AND THROAT WITH OR WITHOUT PRESENCE OF ULCERS  *URINARY PROBLEMS  *BOWEL PROBLEMS  UNUSUAL RASH Items with * indicate a potential emergency and should be followed up as soon as possible.  Feel free to call the clinic you have any questions or concerns. The clinic phone number is [phone].  Please show the CHEMO ALERT CARD at check-in to the Emergency Department and triage nurse.   

## 2015-12-25 ENCOUNTER — Other Ambulatory Visit: Payer: Self-pay | Admitting: Nurse Practitioner

## 2015-12-28 ENCOUNTER — Other Ambulatory Visit: Payer: Self-pay | Admitting: *Deleted

## 2015-12-28 DIAGNOSIS — K219 Gastro-esophageal reflux disease without esophagitis: Secondary | ICD-10-CM

## 2015-12-28 MED ORDER — OMEPRAZOLE 40 MG PO CPDR: 40.0000 mg | DELAYED_RELEASE_CAPSULE | Freq: Every day | ORAL | Status: DC

## 2016-01-11 ENCOUNTER — Encounter: Payer: Self-pay | Admitting: Nurse Practitioner

## 2016-01-11 ENCOUNTER — Telehealth: Payer: Self-pay | Admitting: Nurse Practitioner

## 2016-01-11 ENCOUNTER — Other Ambulatory Visit (HOSPITAL_BASED_OUTPATIENT_CLINIC_OR_DEPARTMENT_OTHER): Payer: BLUE CROSS/BLUE SHIELD

## 2016-01-11 ENCOUNTER — Ambulatory Visit (HOSPITAL_BASED_OUTPATIENT_CLINIC_OR_DEPARTMENT_OTHER): Payer: BLUE CROSS/BLUE SHIELD | Admitting: Nurse Practitioner

## 2016-01-11 ENCOUNTER — Ambulatory Visit (HOSPITAL_BASED_OUTPATIENT_CLINIC_OR_DEPARTMENT_OTHER): Payer: BLUE CROSS/BLUE SHIELD

## 2016-01-11 VITALS — BP 112/72 | HR 70 | Temp 98.3°F | Resp 18 | Wt 156.3 lb

## 2016-01-11 VITALS — BP 130/87 | HR 64 | Temp 97.8°F | Resp 18

## 2016-01-11 DIAGNOSIS — C50411 Malignant neoplasm of upper-outer quadrant of right female breast: Secondary | ICD-10-CM | POA: Diagnosis not present

## 2016-01-11 DIAGNOSIS — Z5112 Encounter for antineoplastic immunotherapy: Secondary | ICD-10-CM

## 2016-01-11 DIAGNOSIS — Z170 Estrogen receptor positive status [ER+]: Secondary | ICD-10-CM | POA: Diagnosis not present

## 2016-01-11 LAB — COMPREHENSIVE METABOLIC PANEL
ALBUMIN: 3.6 g/dL (ref 3.5–5.0)
ALK PHOS: 104 U/L (ref 40–150)
ALT: 22 U/L (ref 0–55)
ANION GAP: 8 meq/L (ref 3–11)
AST: 22 U/L (ref 5–34)
BUN: 18.7 mg/dL (ref 7.0–26.0)
CALCIUM: 9.1 mg/dL (ref 8.4–10.4)
CO2: 27 mEq/L (ref 22–29)
CREATININE: 0.9 mg/dL (ref 0.6–1.1)
Chloride: 103 mEq/L (ref 98–109)
EGFR: 69 mL/min/{1.73_m2} — AB (ref >90)
Glucose: 107 mg/dl (ref 70–140)
Potassium: 4.2 mEq/L (ref 3.5–5.1)
Sodium: 137 mEq/L (ref 136–145)
TOTAL PROTEIN: 6.8 g/dL (ref 6.4–8.3)

## 2016-01-11 LAB — CBC WITH DIFFERENTIAL/PLATELET
BASO%: 0.6 % (ref 0.0–2.0)
Basophils Absolute: 0 10*3/uL (ref 0.0–0.1)
EOS ABS: 0.1 10*3/uL (ref 0.0–0.5)
EOS%: 1.9 % (ref 0.0–7.0)
HEMATOCRIT: 36.2 % (ref 34.8–46.6)
HEMOGLOBIN: 12 g/dL (ref 11.6–15.9)
LYMPH#: 1.4 10*3/uL (ref 0.9–3.3)
LYMPH%: 19.8 % (ref 14.0–49.7)
MCH: 31.4 pg (ref 25.1–34.0)
MCHC: 33.3 g/dL (ref 31.5–36.0)
MCV: 94.3 fL (ref 79.5–101.0)
MONO#: 0.7 10*3/uL (ref 0.1–0.9)
MONO%: 9.6 % (ref 0.0–14.0)
NEUT%: 68.1 % (ref 38.4–76.8)
NEUTROS ABS: 4.7 10*3/uL (ref 1.5–6.5)
PLATELETS: 259 10*3/uL (ref 145–400)
RBC: 3.84 10*6/uL (ref 3.70–5.45)
RDW: 14 % (ref 11.2–14.5)
WBC: 6.9 10*3/uL (ref 3.9–10.3)

## 2016-01-11 MED ORDER — HEPARIN SOD (PORK) LOCK FLUSH 100 UNIT/ML IV SOLN
500.0000 [IU] | Freq: Once | INTRAVENOUS | Status: AC | PRN
  Administered 2016-01-11: 500 [IU]
  Filled 2016-01-11: qty 5

## 2016-01-11 MED ORDER — SODIUM CHLORIDE 0.9 % IJ SOLN
10.0000 mL | INTRAMUSCULAR | Status: DC | PRN
  Administered 2016-01-11: 10 mL
  Filled 2016-01-11: qty 10

## 2016-01-11 MED ORDER — TRASTUZUMAB CHEMO INJECTION 440 MG
6.0000 mg/kg | Freq: Once | INTRAVENOUS | Status: AC
  Administered 2016-01-11: 420 mg via INTRAVENOUS
  Filled 2016-01-11: qty 20

## 2016-01-11 MED ORDER — SODIUM CHLORIDE 0.9 % IV SOLN
Freq: Once | INTRAVENOUS | Status: AC
  Administered 2016-01-11: 16:00:00 via INTRAVENOUS

## 2016-01-11 MED ORDER — ACETAMINOPHEN 325 MG PO TABS
650.0000 mg | ORAL_TABLET | Freq: Once | ORAL | Status: AC
  Administered 2016-01-11: 650 mg via ORAL

## 2016-01-11 MED ORDER — ACETAMINOPHEN 325 MG PO TABS
ORAL_TABLET | ORAL | Status: AC
  Filled 2016-01-11: qty 2

## 2016-01-11 MED ORDER — DIPHENHYDRAMINE HCL 25 MG PO CAPS: 25.0000 mg | ORAL_CAPSULE | Freq: Once | ORAL | Status: DC

## 2016-01-11 NOTE — Progress Notes (Signed)
[place]  [phone] [phone]    ID: Bonnie Ward DOB: [birth year]  MR#: [id number]  NUU#:[id number]  Patient Care Team: Lollie Sails, MD as PCP - General (Internal Medicine) Milford Cage, OTR Kem Boroughs, FNP as Nurse Practitioner (Nurse Practitioner) Fanny Skates, MD as Consulting Physician (General Surgery) Chauncey Cruel, MD as Consulting Physician (Oncology) Thea Silversmith, MD as Consulting Physician (Radiation Oncology) Rockwell Germany, RN as Registered Nurse Mauro Kaufmann, RN as Registered Nurse Holley Bouche, NP as Nurse Practitioner (Nurse Practitioner) Kristeen Miss, MD as Consulting Physician (Neurosurgery) PCP: Lollie Sails, MD OTHER MD:  CHIEF COMPLAINT: Estrogen receptor positive breast cancer  CURRENT TREATMENT: (trastuzumab); tamoxifen  BREAST CANCER HISTORY: From the initial intake note:   Bonnie Ward had routine bilateral screening mammography at the [facility] for [facility] 01/09/2015 showing a possible mass in the right breast. On 12/22/2014 she underwent diagnostic right mammography with tomosynthesis and right breast ultrasonography. The breast density was category C. In the right breast there was an area of increased density with architectural distortion and faint microcalcifications in the upper outer quadrant. There was mild palpable soft tissue thickening in the area in question, but no palpable right axillary lymph nodes. Ultrasound confirmed a 1.7 cm irregular hypoechoic mass. There were no abnormal-appearing right axillary lymph nodes.  Biopsy of the right breast mass in question 12/26/2014 showed ([facility] 16-08/08/2005) an invasive ductal carcinoma, grade 1 or 2, with a prognostic panel still pending.  Her subsequent history is as detailed below  INTERVAL HISTORY: Bonnie Ward returns today for follow-up of her estrogen receptor and HER-2 positive breast cancer. She is due for her next dose of  trastuzumab today. She was approved by Dr. Aundra Dubin to continue on this drug and reduced her bisoprolol dose to 2.6m. Unfortunately it still makes her drowsy so she is not taking it regularly, maybe twice weekly. She continues on tamoxifen daily and she is tolerating this well. She has some mild hot flashes.   REVIEW OF SYSTEMS: Bonnie Ward is chronically constipated. She has heartburn. She takes gabapentin nightly. It helps her sleep and relieves the shooting pains to her left foot. A detailed review of systems is otherwise entirely negative.   PAST MEDICAL HISTORY: Past Medical History  Diagnosis Date  . ACID REFLUX DISEASE 07/12/2010  . Arachnoid cyst     left frontal lobe  . Migraines   . Arthritis     hands  . Nervous stomach   . Difficulty swallowing pills   . Hypothyroidism   . Stress fracture of foot 12/2014    right  . Glaucoma   . Anxiety   . Dental crowns present   . Seasonal allergies   . Complication of anesthesia 2012    states woke up during colonoscopy with Propofol  . [place]    right    PAST SURGICAL HISTORY: Past Surgical History  Procedure Laterality Date  . Tonsillectomy  age 55 . Esophagogastroduodenoscopy endoscopy    . Tooth extraction    . Lasik    . Colonoscopy with propofol  10/14/2013  . Radioactive seed guided mastectomy with axillary sentinel lymph node biopsy Right 01/23/2015    Procedure: RIGHT PARTIAL MASTECTOMY WITH RADIOACTIVE SEED LOCALIZATION  WITH RIGHT AXILLARY SENTINEL LYMPH NODE BIOPSY;  Surgeon: HFanny Skates MD;  [place] [facility]  Service: General;  Laterality: Right;  . Portacath placement Left 01/23/2015    Procedure: INSERTION PORT-A-CATH ;  Surgeon: HFanny Skates  MD;  Location: [place];  Service: General;  Laterality: Left;  . Re-excision of breast lumpectomy Right 02/16/2015    Procedure: RE-EXCISION OF RIGHT  BREAST LUMPECTOMY MARGINS;  Surgeon: Fanny Skates, MD;  Location:  [place];  Service: General;  Laterality: Right;    FAMILY HISTORY Family History  Problem Relation Age of Onset  . Osteoporosis Mother   . Irritable bowel syndrome Mother   . Hypertension Mother   . Hypertension Father   . Hyperlipidemia Father   . Diabetes Father   . Dementia Father   . Colon cancer Maternal Grandfather 54  . Breast cancer Paternal Aunt   . Breast cancer Paternal Grandmother    the patient's father died at age 2 from pneumonia. [name] died at age 22 after bowel perforation. The patient had one brother, one sister. There is no history of breast or ovarian cancer in the family.  GYNECOLOGIC HISTORY:  Patient's last menstrual period was 01/25/2011. Menarche age 47, the patient is GX P0. She stopped having periods approximately 2011. She is still on hormone replacement, but is "trickling off it". She did take birth control pills for approximately 3 years remotely with no complications  SOCIAL HISTORY:  Bonnie Ward works as a Haematologist. She is divorced and lives by herself, with 2 dogs.    ADVANCED DIRECTIVES: The patient's brother [name] is her healthcare power of attorney. He may be reached in [place] at [place]: Social History  Substance Use Topics  . Smoking status: Former Smoker -- 0.00 packs/day for 0 years    Quit date: 02/19/2005  . Smokeless tobacco: Never Used  . Alcohol Use: Yes     Comment: occasionally     Colonoscopy: February 2016  PAP: June 2015  Bone density: 12/02/2013-- T - 2.2  Lipid panel:  Allergies  Allergen Reactions  . Lortab [Hydrocodone-Acetaminophen] Anxiety  . Milk-Related Compounds Diarrhea    GI UPSET  . Coreg [Carvedilol] Other (See Comments)    Fatigue     Current Outpatient Prescriptions  Medication Sig Dispense Refill  . bimatoprost (LUMIGAN) 0.03 % ophthalmic solution Place 1 drop into both eyes at bedtime.    . bisoprolol (ZEBETA) 5 MG tablet Take 0.5  tablets (2.5 mg total) by mouth at bedtime. 15 tablet 3  . gabapentin (NEURONTIN) 300 MG capsule Take 1 capsule (300 mg total) by mouth at bedtime. 30 capsule 2  . lidocaine-prilocaine (EMLA) cream Reported on 09/23/2015  3  . liothyronine (CYTOMEL) 5 MCG tablet Take 5 mcg by mouth 2 (two) times daily.    Marland Kitchen omeprazole (PRILOSEC) 40 MG capsule Take 1 capsule (40 mg total) by mouth daily. 30 capsule 2  . tamoxifen (NOLVADEX) 20 MG tablet Take 20 mg by mouth daily.     No current facility-administered medications for this visit.   Facility-Administered Medications Ordered in Other Visits  Medication Dose Route Frequency Provider Last Rate Last Dose  . diphenhydrAMINE (BENADRYL) capsule 25 mg  25 mg Oral Once Chauncey Cruel, MD   25 mg at 01/11/16 1536  . heparin lock flush 100 unit/mL  500 Units Intracatheter Once PRN Chauncey Cruel, MD      . sodium chloride 0.9 % injection 10 mL  10 mL Intracatheter PRN Chauncey Cruel, MD      . trastuzumab (HERCEPTIN) 420 mg in sodium chloride 0.9 % 250 mL chemo infusion  6 mg/kg (Treatment Plan Actual) Intravenous Once Sarajane Jews  C Magrinat, MD 540 mL/hr at 01/11/16 1605 420 mg at 01/11/16 1605    OBJECTIVE: Middle-aged white woman in no acute distress Filed Vitals:   01/11/16 1432  BP: 112/72  Pulse: 70  Temp: 98.3 F (36.8 C)  Resp: 18     Body mass index is 25.6 kg/(m^2).    ECOG FS:1 - Symptomatic but completely ambulatory  Skin: warm, dry  HEENT: sclerae anicteric, conjunctivae pink, oropharynx clear. No thrush or mucositis.  Lymph Nodes: No cervical or supraclavicular lymphadenopathy  Lungs: clear to auscultation bilaterally, no rales, wheezes, or rhonci  Heart: regular rate and rhythm  Abdomen: round, soft, non tender, positive bowel sounds  Musculoskeletal: No focal spinal tenderness, no peripheral edema  Neuro: non focal, well oriented, positive affect  Breasts: deferred  LAB RESULTS:  CMP     Component Value Date/Time   NA 137  01/11/2016 1424   NA 140 01/21/2009 0828   K 4.2 01/11/2016 1424   K 4.1 01/21/2009 0828   CL 108 01/21/2009 0828   CO2 27 01/11/2016 1424   CO2 30 01/21/2009 0828   GLUCOSE 107 01/11/2016 1424   GLUCOSE 90 01/21/2009 0828   BUN 18.7 01/11/2016 1424   BUN 13 01/21/2009 0828   CREATININE 0.9 01/11/2016 1424   CREATININE 0.7 01/21/2009 0828   CALCIUM 9.1 01/11/2016 1424   CALCIUM 8.5 01/21/2009 0828   PROT 6.8 01/11/2016 1424   PROT 6.9 01/21/2009 0828   ALBUMIN 3.6 01/11/2016 1424   ALBUMIN 3.7 01/21/2009 0828   AST 22 01/11/2016 1424   AST 22 01/21/2009 0828   ALT 22 01/11/2016 1424   ALT 19 01/21/2009 0828   ALKPHOS 104 01/11/2016 1424   ALKPHOS 43 01/21/2009 0828   BILITOT <0.30 01/11/2016 1424   BILITOT 1.1 01/21/2009 0828   GFRNONAA 95.01 01/21/2009 0828    INo results found for: SPEP, UPEP  Lab Results  Component Value Date   WBC 6.9 01/11/2016   NEUTROABS 4.7 01/11/2016   HGB 12.0 01/11/2016   HCT 36.2 01/11/2016   MCV 94.3 01/11/2016   PLT 259 01/11/2016      Chemistry      Component Value Date/Time   NA 137 01/11/2016 1424   NA 140 01/21/2009 0828   K 4.2 01/11/2016 1424   K 4.1 01/21/2009 0828   CL 108 01/21/2009 0828   CO2 27 01/11/2016 1424   CO2 30 01/21/2009 0828   BUN 18.7 01/11/2016 1424   BUN 13 01/21/2009 0828   CREATININE 0.9 01/11/2016 1424   CREATININE 0.7 01/21/2009 0828      Component Value Date/Time   CALCIUM 9.1 01/11/2016 1424   CALCIUM 8.5 01/21/2009 0828   ALKPHOS 104 01/11/2016 1424   ALKPHOS 43 01/21/2009 0828   AST 22 01/11/2016 1424   AST 22 01/21/2009 0828   ALT 22 01/11/2016 1424   ALT 19 01/21/2009 0828   BILITOT <0.30 01/11/2016 1424   BILITOT 1.1 01/21/2009 0828       No results found for: LABCA2  No components found for: LABCA125  No results for input(s): INR in the last 168 hours.  Urinalysis    Component Value Date/Time   COLORURINE yellow 10/05/2009 1454   APPEARANCEUR Clear 10/05/2009 1454    LABSPEC 1.010 10/05/2009 1454   PHURINE 7.0 10/05/2009 1454   HGBUR 2+ 10/05/2009 1454   BILIRUBINUR neg 04/13/2015 1605   BILIRUBINUR negative 10/05/2009 1454   PROTEINUR neg 04/13/2015 1605   UROBILINOGEN negative 04/13/2015  1605   UROBILINOGEN 0.2 10/05/2009 1454   NITRITE neg 04/13/2015 1605   NITRITE negative 10/05/2009 1454   LEUKOCYTESUR Negative 04/13/2015 1605    STUDIES: No results found.  ASSESSMENT: 55 y.o. [place] woman status post right breast biopsy 12/26/2014 for a clinical T1 cN0, stage IA invasive ductal carcinoma, grade 1 or 2, estrogen and progesterone receptor positive, with an MIB-1 of 11%, and HER-2 amplified, with a signals ratio of 2.07, number per cell 2.28.  (1) status post right lumpectomy and sentinel lymph node sampling 01/23/2015 for a pT1c pN1, stage IIA invasive ductal carcinoma, grade 1, with focally positive anterior margins  (a) additional surgery 02/16/2015 found residual disease but cleared margins  (2) chemotherapy started 03/05/2015, consisting of doxorubicin and cyclophosphamide in dose dense fashion 4, completed 04/16/2015, followed by paclitaxel weekly x 12 planned cycles, started 04/30/2015,  with trastuzumab and pertuzumab given every 3 weeks.  (a) Paclitaxel stopped after 4 cycles because of progressive neuropathy, last dose 05/28/2015  (b) pertuzumab stopped after 2 cycles (when paclitaxel stopped)  (3) trastuzumab will be continued to complete 1 year (through August 2017)  (a) echocardiogram 08/20/2015 shows a >10% drop-- trastuzumab held after 07/30/2015 dose  (4) adjuvant radiation 07/06/2015-08/21/2015::  Right breast / 45 Gray @ 1.8 Pearline Cables per fraction x 25 fractions Right supraclavicular fossa/PAB 45 Gy '@1' .8 Gy per fraction x 25 fractions Right breast boost / 16 Gray at [employer] per fraction x 8 fractions  (5) started tamoxifen 09/10/2015  PLAN: Bonnie Ward continues to do well with treatment. She will proceed with her next dose  of trastuzumab as planned today. I asked her to reach out to Dr. Claris Gladden office to let them know she is still not tolerating her bisoprolol well.   She will continue trastuzumab through September. Her next echocardiogram is due in July. I have placed orders for her annual mammogram which is is slightly overdue.    Bonnie Ward will return in 3 months for follow up with Dr. Jana Hakim.  She understands and agrees with this plan. She knows the goal of treatment in her case is cure. She has been encouraged to call with any issues that might arise before her next visit here.    Laurie Panda, NP   01/11/2016 4:13 PM

## 2016-01-11 NOTE — Telephone Encounter (Signed)
appt made and avs printed. Left message for Dr Aundra Dubin office to schedule echo/visit for pt in July.

## 2016-01-11 NOTE — Patient Instructions (Signed)
[facility] Discharge Instructions for Patients Receiving Chemotherapy  Today you received the following chemotherapy agents Herceptin  To help prevent nausea and vomiting after your treatment, we encourage you to take your nausea medication    If you develop nausea and vomiting that is not controlled by your nausea medication, call the clinic.   BELOW ARE SYMPTOMS THAT SHOULD BE REPORTED IMMEDIATELY:  *FEVER GREATER THAN 100.5 F  *CHILLS WITH OR WITHOUT FEVER  NAUSEA AND VOMITING THAT IS NOT CONTROLLED WITH YOUR NAUSEA MEDICATION  *UNUSUAL SHORTNESS OF BREATH  *UNUSUAL BRUISING OR BLEEDING  TENDERNESS IN MOUTH AND THROAT WITH OR WITHOUT PRESENCE OF ULCERS  *URINARY PROBLEMS  *BOWEL PROBLEMS  UNUSUAL RASH Items with * indicate a potential emergency and should be followed up as soon as possible.  Feel free to call the clinic you have any questions or concerns. The clinic phone number is [phone].  Please show the CHEMO ALERT CARD at check-in to the Emergency Department and triage nurse.   

## 2016-01-15 ENCOUNTER — Telehealth: Payer: Self-pay | Admitting: Oncology

## 2016-01-15 NOTE — Telephone Encounter (Signed)
pt called to r/s appts to mondays...done.Marland KitchenMarland KitchenMarland KitchenMarland Kitchenpt ok and aware of new d.t

## 2016-01-25 ENCOUNTER — Ambulatory Visit
Admission: RE | Admit: 2016-01-25 | Discharge: 2016-01-25 | Disposition: A | Discharge status: Home / Self Care | Payer: BLUE CROSS/BLUE SHIELD | Source: Ambulatory Visit | Attending: Nurse Practitioner | Admitting: Nurse Practitioner

## 2016-01-25 DIAGNOSIS — R922 Inconclusive mammogram: Secondary | ICD-10-CM | POA: Diagnosis not present

## 2016-01-25 DIAGNOSIS — L570 Actinic keratosis: Secondary | ICD-10-CM | POA: Diagnosis not present

## 2016-01-25 DIAGNOSIS — C50411 Malignant neoplasm of upper-outer quadrant of right female breast: Secondary | ICD-10-CM

## 2016-01-28 ENCOUNTER — Telehealth (HOSPITAL_COMMUNITY): Payer: Self-pay | Admitting: Vascular Surgery

## 2016-01-28 NOTE — Telephone Encounter (Signed)
Left pt message to make 3 month f/u appt w/ ECHO

## 2016-02-01 ENCOUNTER — Other Ambulatory Visit (HOSPITAL_BASED_OUTPATIENT_CLINIC_OR_DEPARTMENT_OTHER): Payer: BLUE CROSS/BLUE SHIELD

## 2016-02-01 ENCOUNTER — Ambulatory Visit (HOSPITAL_BASED_OUTPATIENT_CLINIC_OR_DEPARTMENT_OTHER): Payer: BLUE CROSS/BLUE SHIELD

## 2016-02-01 VITALS — BP 132/64 | HR 75 | Resp 16

## 2016-02-01 DIAGNOSIS — C50411 Malignant neoplasm of upper-outer quadrant of right female breast: Secondary | ICD-10-CM

## 2016-02-01 DIAGNOSIS — Z5112 Encounter for antineoplastic immunotherapy: Secondary | ICD-10-CM

## 2016-02-01 LAB — CBC WITH DIFFERENTIAL/PLATELET
BASO%: 0.4 % (ref 0.0–2.0)
Basophils Absolute: 0 10*3/uL (ref 0.0–0.1)
EOS%: 1.4 % (ref 0.0–7.0)
Eosinophils Absolute: 0.1 10*3/uL (ref 0.0–0.5)
HCT: 36.7 % (ref 34.8–46.6)
HGB: 12.1 g/dL (ref 11.6–15.9)
LYMPH%: 24.2 % (ref 14.0–49.7)
MCH: 31.2 pg (ref 25.1–34.0)
MCHC: 32.9 g/dL (ref 31.5–36.0)
MCV: 95.1 fL (ref 79.5–101.0)
MONO#: 0.5 10*3/uL (ref 0.1–0.9)
MONO%: 9.4 % (ref 0.0–14.0)
NEUT#: 3.6 10*3/uL (ref 1.5–6.5)
NEUT%: 64.6 % (ref 38.4–76.8)
Platelets: 259 10*3/uL (ref 145–400)
RBC: 3.86 10*6/uL (ref 3.70–5.45)
RDW: 14.5 % (ref 11.2–14.5)
WBC: 5.5 10*3/uL (ref 3.9–10.3)
lymph#: 1.3 10*3/uL (ref 0.9–3.3)

## 2016-02-01 LAB — COMPREHENSIVE METABOLIC PANEL
ALT: 24 U/L (ref 0–55)
AST: 27 U/L (ref 5–34)
Albumin: 3.7 g/dL (ref 3.5–5.0)
Alkaline Phosphatase: 85 U/L (ref 40–150)
Anion Gap: 10 mEq/L (ref 3–11)
BUN: 13 mg/dL (ref 7.0–26.0)
CHLORIDE: 104 meq/L (ref 98–109)
CO2: 27 meq/L (ref 22–29)
CREATININE: 0.9 mg/dL (ref 0.6–1.1)
Calcium: 9.1 mg/dL (ref 8.4–10.4)
EGFR: 75 mL/min/{1.73_m2} — ABNORMAL LOW (ref >90)
Glucose: 101 mg/dl (ref 70–140)
Potassium: 3.7 mEq/L (ref 3.5–5.1)
Sodium: 140 mEq/L (ref 136–145)
Total Bilirubin: 0.39 mg/dL (ref 0.20–1.20)
Total Protein: 6.9 g/dL (ref 6.4–8.3)

## 2016-02-01 MED ORDER — ACETAMINOPHEN 325 MG PO TABS
ORAL_TABLET | ORAL | Status: AC
  Filled 2016-02-01: qty 2

## 2016-02-01 MED ORDER — TRASTUZUMAB CHEMO INJECTION 440 MG
6.0000 mg/kg | Freq: Once | INTRAVENOUS | Status: AC
  Administered 2016-02-01: 420 mg via INTRAVENOUS
  Filled 2016-02-01: qty 20

## 2016-02-01 MED ORDER — DIPHENHYDRAMINE HCL 25 MG PO CAPS
ORAL_CAPSULE | ORAL | Status: AC
  Filled 2016-02-01: qty 1

## 2016-02-01 MED ORDER — ACETAMINOPHEN 325 MG PO TABS
650.0000 mg | ORAL_TABLET | Freq: Once | ORAL | Status: AC
  Administered 2016-02-01: 650 mg via ORAL

## 2016-02-01 MED ORDER — HEPARIN SOD (PORK) LOCK FLUSH 100 UNIT/ML IV SOLN
500.0000 [IU] | Freq: Once | INTRAVENOUS | Status: AC | PRN
  Administered 2016-02-01: 500 [IU]
  Filled 2016-02-01: qty 5

## 2016-02-01 MED ORDER — SODIUM CHLORIDE 0.9 % IV SOLN
Freq: Once | INTRAVENOUS | Status: AC
  Administered 2016-02-01: 14:00:00 via INTRAVENOUS

## 2016-02-01 MED ORDER — SODIUM CHLORIDE 0.9 % IJ SOLN
10.0000 mL | INTRAMUSCULAR | Status: DC | PRN
  Administered 2016-02-01: 10 mL
  Filled 2016-02-01: qty 10

## 2016-02-03 ENCOUNTER — Other Ambulatory Visit: Payer: BLUE CROSS/BLUE SHIELD

## 2016-02-03 ENCOUNTER — Ambulatory Visit: Payer: BLUE CROSS/BLUE SHIELD

## 2016-02-07 ENCOUNTER — Other Ambulatory Visit: Payer: Self-pay | Admitting: Nurse Practitioner

## 2016-02-08 NOTE — Telephone Encounter (Signed)
Chart reviewed.

## 2016-02-22 ENCOUNTER — Other Ambulatory Visit (HOSPITAL_BASED_OUTPATIENT_CLINIC_OR_DEPARTMENT_OTHER): Payer: BLUE CROSS/BLUE SHIELD

## 2016-02-22 ENCOUNTER — Ambulatory Visit (HOSPITAL_BASED_OUTPATIENT_CLINIC_OR_DEPARTMENT_OTHER): Payer: BLUE CROSS/BLUE SHIELD

## 2016-02-22 ENCOUNTER — Other Ambulatory Visit: Payer: Self-pay | Admitting: Hematology

## 2016-02-22 VITALS — BP 124/74 | HR 67 | Temp 97.1°F | Resp 18

## 2016-02-22 DIAGNOSIS — Z5112 Encounter for antineoplastic immunotherapy: Secondary | ICD-10-CM | POA: Diagnosis not present

## 2016-02-22 DIAGNOSIS — C50411 Malignant neoplasm of upper-outer quadrant of right female breast: Secondary | ICD-10-CM

## 2016-02-22 LAB — COMPREHENSIVE METABOLIC PANEL
ALT: 18 U/L (ref 0–55)
ANION GAP: 8 meq/L (ref 3–11)
AST: 20 U/L (ref 5–34)
Albumin: 3.5 g/dL (ref 3.5–5.0)
Alkaline Phosphatase: 78 U/L (ref 40–150)
BUN: 15 mg/dL (ref 7.0–26.0)
CHLORIDE: 102 meq/L (ref 98–109)
CO2: 27 meq/L (ref 22–29)
CREATININE: 0.8 mg/dL (ref 0.6–1.1)
Calcium: 9.1 mg/dL (ref 8.4–10.4)
EGFR: 82 mL/min/{1.73_m2} — ABNORMAL LOW (ref >90)
GLUCOSE: 99 mg/dL (ref 70–140)
Potassium: 4.4 mEq/L (ref 3.5–5.1)
SODIUM: 138 meq/L (ref 136–145)
TOTAL PROTEIN: 6.7 g/dL (ref 6.4–8.3)
Total Bilirubin: 0.4 mg/dL (ref 0.20–1.20)

## 2016-02-22 LAB — CBC WITH DIFFERENTIAL/PLATELET
BASO%: 0.4 % (ref 0.0–2.0)
Basophils Absolute: 0 10*3/uL (ref 0.0–0.1)
EOS%: 1.1 % (ref 0.0–7.0)
Eosinophils Absolute: 0.1 10*3/uL (ref 0.0–0.5)
HCT: 37.2 % (ref 34.8–46.6)
HGB: 12.2 g/dL (ref 11.6–15.9)
LYMPH%: 21 % (ref 14.0–49.7)
MCH: 31.4 pg (ref 25.1–34.0)
MCHC: 32.8 g/dL (ref 31.5–36.0)
MCV: 95.6 fL (ref 79.5–101.0)
MONO#: 0.5 10*3/uL (ref 0.1–0.9)
MONO%: 8.7 % (ref 0.0–14.0)
NEUT%: 68.8 % (ref 38.4–76.8)
NEUTROS ABS: 3.6 10*3/uL (ref 1.5–6.5)
PLATELETS: 254 10*3/uL (ref 145–400)
RBC: 3.89 10*6/uL (ref 3.70–5.45)
RDW: 14.4 % (ref 11.2–14.5)
WBC: 5.2 10*3/uL (ref 3.9–10.3)
lymph#: 1.1 10*3/uL (ref 0.9–3.3)

## 2016-02-22 MED ORDER — TRASTUZUMAB CHEMO INJECTION 440 MG
6.0000 mg/kg | Freq: Once | INTRAVENOUS | Status: AC
  Administered 2016-02-22: 420 mg via INTRAVENOUS
  Filled 2016-02-22: qty 20

## 2016-02-22 MED ORDER — HEPARIN SOD (PORK) LOCK FLUSH 100 UNIT/ML IV SOLN
500.0000 [IU] | Freq: Once | INTRAVENOUS | Status: AC | PRN
  Administered 2016-02-22: 500 [IU]
  Filled 2016-02-22: qty 5

## 2016-02-22 MED ORDER — DIPHENHYDRAMINE HCL 25 MG PO CAPS: 25.0000 mg | ORAL_CAPSULE | Freq: Once | ORAL | Status: DC

## 2016-02-22 MED ORDER — ACETAMINOPHEN 325 MG PO TABS
650.0000 mg | ORAL_TABLET | Freq: Once | ORAL | Status: AC
  Administered 2016-02-22: 650 mg via ORAL

## 2016-02-22 MED ORDER — SODIUM CHLORIDE 0.9 % IV SOLN
Freq: Once | INTRAVENOUS | Status: AC
  Administered 2016-02-22: 13:00:00 via INTRAVENOUS

## 2016-02-22 MED ORDER — ACETAMINOPHEN 325 MG PO TABS
ORAL_TABLET | ORAL | Status: AC
  Filled 2016-02-22: qty 2

## 2016-02-22 MED ORDER — SODIUM CHLORIDE 0.9 % IJ SOLN
10.0000 mL | INTRAMUSCULAR | Status: DC | PRN
  Administered 2016-02-22: 10 mL
  Filled 2016-02-22: qty 10

## 2016-02-22 NOTE — Patient Instructions (Signed)
[facility] Discharge Instructions for Patients Receiving Chemotherapy  Today you received the following chemotherapy agents Herceptin  To help prevent nausea and vomiting after your treatment, we encourage you to take your nausea medication    If you develop nausea and vomiting that is not controlled by your nausea medication, call the clinic.   BELOW ARE SYMPTOMS THAT SHOULD BE REPORTED IMMEDIATELY:  *FEVER GREATER THAN 100.5 F  *CHILLS WITH OR WITHOUT FEVER  NAUSEA AND VOMITING THAT IS NOT CONTROLLED WITH YOUR NAUSEA MEDICATION  *UNUSUAL SHORTNESS OF BREATH  *UNUSUAL BRUISING OR BLEEDING  TENDERNESS IN MOUTH AND THROAT WITH OR WITHOUT PRESENCE OF ULCERS  *URINARY PROBLEMS  *BOWEL PROBLEMS  UNUSUAL RASH Items with * indicate a potential emergency and should be followed up as soon as possible.  Feel free to call the clinic you have any questions or concerns. The clinic phone number is [phone].  Please show the CHEMO ALERT CARD at check-in to the Emergency Department and triage nurse.   

## 2016-02-24 ENCOUNTER — Other Ambulatory Visit: Payer: BLUE CROSS/BLUE SHIELD

## 2016-02-24 ENCOUNTER — Ambulatory Visit: Payer: BLUE CROSS/BLUE SHIELD

## 2016-02-25 ENCOUNTER — Other Ambulatory Visit: Payer: Self-pay | Admitting: Oncology

## 2016-02-25 DIAGNOSIS — H401123 Primary open-angle glaucoma, left eye, severe stage: Secondary | ICD-10-CM | POA: Diagnosis not present

## 2016-02-25 DIAGNOSIS — H2513 Age-related nuclear cataract, bilateral: Secondary | ICD-10-CM | POA: Diagnosis not present

## 2016-03-07 ENCOUNTER — Ambulatory Visit (HOSPITAL_COMMUNITY)
Admission: RE | Admit: 2016-03-07 | Discharge: 2016-03-07 | Disposition: A | Discharge status: Home / Self Care | Payer: BLUE CROSS/BLUE SHIELD | Source: Ambulatory Visit | Attending: Cardiology | Admitting: Cardiology

## 2016-03-07 ENCOUNTER — Ambulatory Visit (HOSPITAL_BASED_OUTPATIENT_CLINIC_OR_DEPARTMENT_OTHER)
Admission: RE | Admit: 2016-03-07 | Discharge: 2016-03-07 | Disposition: A | Discharge status: Home / Self Care | Payer: BLUE CROSS/BLUE SHIELD | Source: Ambulatory Visit | Attending: Cardiology | Admitting: Cardiology

## 2016-03-07 ENCOUNTER — Other Ambulatory Visit: Payer: Self-pay | Admitting: Nurse Practitioner

## 2016-03-07 VITALS — BP 118/58 | HR 78 | Wt 157.8 lb

## 2016-03-07 DIAGNOSIS — G629 Polyneuropathy, unspecified: Secondary | ICD-10-CM | POA: Insufficient documentation

## 2016-03-07 DIAGNOSIS — C50411 Malignant neoplasm of upper-outer quadrant of right female breast: Secondary | ICD-10-CM | POA: Diagnosis not present

## 2016-03-07 DIAGNOSIS — Z79899 Other long term (current) drug therapy: Secondary | ICD-10-CM | POA: Insufficient documentation

## 2016-03-07 DIAGNOSIS — I340 Nonrheumatic mitral (valve) insufficiency: Secondary | ICD-10-CM | POA: Insufficient documentation

## 2016-03-07 DIAGNOSIS — I071 Rheumatic tricuspid insufficiency: Secondary | ICD-10-CM | POA: Diagnosis not present

## 2016-03-07 DIAGNOSIS — R072 Precordial pain: Secondary | ICD-10-CM

## 2016-03-07 DIAGNOSIS — K589 Irritable bowel syndrome without diarrhea: Secondary | ICD-10-CM | POA: Insufficient documentation

## 2016-03-07 DIAGNOSIS — Z87891 Personal history of nicotine dependence: Secondary | ICD-10-CM | POA: Insufficient documentation

## 2016-03-07 DIAGNOSIS — K219 Gastro-esophageal reflux disease without esophagitis: Secondary | ICD-10-CM | POA: Diagnosis not present

## 2016-03-07 DIAGNOSIS — I371 Nonrheumatic pulmonary valve insufficiency: Secondary | ICD-10-CM | POA: Insufficient documentation

## 2016-03-07 DIAGNOSIS — R079 Chest pain, unspecified: Secondary | ICD-10-CM | POA: Diagnosis not present

## 2016-03-07 MED ORDER — NEBIVOLOL HCL 2.5 MG PO TABS: 2.5000 mg | ORAL_TABLET | Freq: Every day | ORAL | Status: DC

## 2016-03-07 NOTE — Progress Notes (Signed)
Patient ID: Bonnie Ward, female   DOB: [birth year], 55 y.o.   MRN: [id number] Oncologist: Dr. Jana Hakim  55 yo with triple receptor positive breast cancer returns for cardio-oncology evaluation.  Cancer was diagnosed in 5/16.  She is s/p right partial mastectomy.  Cancer was ER+/PR+/HER2+.  She had chemotherapy with doxorubicin and cyclophosphamide completed 04/16/2015, followed by paclitaxel weekly starting 04/30/2015.  Paclitaxel was stopped due to peripheral neuropathy.  Herceptin held after 12/16 echo then restarted.  She is currently getting Herceptin every 3 wks.   No chest pain.  For the last week or so, she has had episodes of central chest tightness.  It has happened both at rest and with exertion (at [employer]).  When it occurs, it is mild and lasts about 30 minutes.  Nothing can clearly reproduce it. It is not like GERD.  Labs (12/16): K 4.1, creatinine 0.8   PMH: 1. GERD 2. IBS 3. Recurrent UTIs 4. Breast cancer: ER+/PR+/HER2+.  Diagnosed 5/16, had partial right mastectomy in 6/16.  - Echo (6/16) with EF 55-60%, normal RV, lateral s' 14 cm/sec, GLS -23.3%.  - Doxorubicin/cyclophosphamide until 8/16.  - Paclitaxel until 10/16.  - Herceptin ongoing.  - Echo (12/16) with EF 50%, mild LV dilation, GLS -19.9%.  - Echo (2/17) with EF 55%, lateral s' 9.6, GLS -22.0%, normal diastolic function, normal RV size and systolic function.  - Echo (3/17) with EF 55%, lateral s' 11.9, GLS -25.4%, normal diastolic function, normal RV size and systolic function.  - Echo (7/17): I reviewed, think EF unchanged at 55%, GLS -19.3%.  5. Neuropathy from paclitaxel.  SH: Quit smoking in 2016, works as Haematologist, rare ETOH.   FH: Uncle with MI in his 66s. Grandfather with MI in his 85s.   ROS: All systems reviewed and negative except as per HPI.   Current Outpatient Prescriptions  Medication Sig Dispense Refill  . bimatoprost (LUMIGAN) 0.03 % ophthalmic solution Place 1 drop into both eyes at bedtime.     . gabapentin (NEURONTIN) 300 MG capsule TAKE 1 CAPSULE(300 MG) BY MOUTH AT BEDTIME 30 capsule 3  . lidocaine-prilocaine (EMLA) cream Reported on 09/23/2015  3  . liothyronine (CYTOMEL) 5 MCG tablet Take 5 mcg by mouth 2 (two) times daily.    Marland Kitchen omeprazole (PRILOSEC) 40 MG capsule Take 1 capsule (40 mg total) by mouth daily. 30 capsule 2  . tamoxifen (NOLVADEX) 20 MG tablet Take 20 mg by mouth daily.    . nebivolol (BYSTOLIC) 2.5 MG tablet Take 1 tablet (2.5 mg total) by mouth daily. 30 tablet 3   No current facility-administered medications for this encounter.   BP 118/58 mmHg  Pulse 78  Wt 157 lb 12 oz (71.555 kg)  SpO2 99%  LMP 01/25/2011 General: NAD Neck: No JVD, no thyromegaly or thyroid nodule.  Lungs: Clear to auscultation bilaterally with normal respiratory effort. CV: Nondisplaced PMI.  Heart regular S1/S2, no S3/S4, no murmur.  No peripheral edema.  No carotid bruit.  Normal pedal pulses.  Abdomen: Soft, nontender, no hepatosplenomegaly, no distention.  Skin: Intact without lesions or rashes. Port right upper chest.  Neurologic: Alert and oriented x 3.  Psych: Normal affect. Extremities: No clubbing or cyanosis.  HEENT: Normal.   Assessment/Plan: 55 yo with triple positive breast cancer s/p partial mastectomy.   1. Chemotherapy cardiotoxicity: She had doxorubicin and cyclophosphamide until 8/16 and is now getting Herceptin, planned to continue until 9/17.  Baseline echo prior to treatment was normal. Herceptin was held after  12/16 echo with mild fall in EF but now back on Herceptin.  Echo today showed stable EF a small fall in global longitudinal strain (<15% fall).   - She finishes Herceptin in 9/17. I think that she can continue it but I would like her back on a beta blocker . She did not tolerate Coreg or bisoprolol due to profound fatigue.   I will try her on nebivolol 2.5 mg in the evening. Repeat echo in 3 months (will have completed Herceptin).  2. Chest pain: Typical  and atypical features.  I will arrange for ETT-Cardiolite to risk stratify.   Loralie Champagne 03/07/2016

## 2016-03-07 NOTE — Patient Instructions (Addendum)
Stop Bisoprolol  Start Bystolic 2.5 mg daily  Your physician has requested that you have en exercise stress myoview. For further information please visit [email or web address]. Please follow instruction sheet, as given.  We will contact you in 3 months to schedule your next appointment and echocardiogram

## 2016-03-07 NOTE — Progress Notes (Signed)
  Echocardiogram 2D Echocardiogram has been performed.  Bonnie Ward 03/07/2016, 2:05 PM

## 2016-03-08 ENCOUNTER — Telehealth (HOSPITAL_COMMUNITY): Payer: Self-pay | Admitting: *Deleted

## 2016-03-08 NOTE — Telephone Encounter (Signed)
Left message on voicemail per DPR in reference to upcoming appointment scheduled on 03/09/16 at 7:15 with detailed instructions given per Myocardial Perfusion Study Information Sheet for the test. LM to arrive 15 minutes early, and that it is imperative to arrive on time for appointment to keep from having the test rescheduled. If you need to cancel or reschedule your appointment, please call the office within 24 hours of your appointment. Failure to do so may result in a cancellation of your appointment, and a $50 no show fee. Phone number given for call back for any questions.

## 2016-03-09 ENCOUNTER — Ambulatory Visit (HOSPITAL_COMMUNITY): Payer: BLUE CROSS/BLUE SHIELD | Attending: Cardiovascular Disease

## 2016-03-09 DIAGNOSIS — Z8249 Family history of ischemic heart disease and other diseases of the circulatory system: Secondary | ICD-10-CM | POA: Diagnosis not present

## 2016-03-09 DIAGNOSIS — R9439 Abnormal result of other cardiovascular function study: Secondary | ICD-10-CM | POA: Insufficient documentation

## 2016-03-09 DIAGNOSIS — R079 Chest pain, unspecified: Secondary | ICD-10-CM | POA: Diagnosis not present

## 2016-03-09 LAB — MYOCARDIAL PERFUSION IMAGING
CHL CUP NUCLEAR SRS: 9
CHL CUP NUCLEAR SSS: 10
CSEPED: 10 min
CSEPEDS: 0 s
CSEPPHR: 173 {beats}/min
Estimated workload: 11.7 METS
LHR: 0.26
LVDIAVOL: 84 mL (ref 46–106)
LVSYSVOL: 38 mL
MPHR: 166 {beats}/min
NUC STRESS TID: 0.94
Percent HR: 104 %
Rest HR: 64 {beats}/min
SDS: 1

## 2016-03-09 MED ORDER — TECHNETIUM TC 99M TETROFOSMIN IV KIT
31.6000 | PACK | Freq: Once | INTRAVENOUS | Status: AC | PRN
  Administered 2016-03-09: 32 via INTRAVENOUS
  Filled 2016-03-09: qty 32

## 2016-03-09 MED ORDER — TECHNETIUM TC 99M TETROFOSMIN IV KIT
10.6000 | PACK | Freq: Once | INTRAVENOUS | Status: AC | PRN
  Administered 2016-03-09: 11 via INTRAVENOUS
  Filled 2016-03-09: qty 11

## 2016-03-11 ENCOUNTER — Telehealth (HOSPITAL_COMMUNITY): Payer: Self-pay

## 2016-03-11 NOTE — Telephone Encounter (Signed)
Notes Recorded by Effie Berkshire, RN on 03/11/2016 at 12:06 PM Results reviewed with patient, no questions/concerns at this time  Notes Recorded by Scarlette Calico, RN on 03/10/2016 at 4:26 PM Left message for patient to call back.  Notes Recorded by Larey Dresser, MD on 03/09/2016 at 10:03 PM Low risk study. There is a perfusion defect thought to be artifact. No ischemia.

## 2016-03-14 ENCOUNTER — Other Ambulatory Visit (HOSPITAL_BASED_OUTPATIENT_CLINIC_OR_DEPARTMENT_OTHER): Payer: BLUE CROSS/BLUE SHIELD

## 2016-03-14 ENCOUNTER — Ambulatory Visit (HOSPITAL_BASED_OUTPATIENT_CLINIC_OR_DEPARTMENT_OTHER): Payer: BLUE CROSS/BLUE SHIELD

## 2016-03-14 VITALS — BP 115/75 | HR 85 | Temp 97.8°F | Resp 18

## 2016-03-14 DIAGNOSIS — C50411 Malignant neoplasm of upper-outer quadrant of right female breast: Secondary | ICD-10-CM | POA: Diagnosis not present

## 2016-03-14 DIAGNOSIS — Z5112 Encounter for antineoplastic immunotherapy: Secondary | ICD-10-CM | POA: Diagnosis not present

## 2016-03-14 LAB — CBC WITH DIFFERENTIAL/PLATELET
BASO%: 0.1 % (ref 0.0–2.0)
BASOS ABS: 0 10*3/uL (ref 0.0–0.1)
EOS ABS: 0.1 10*3/uL (ref 0.0–0.5)
EOS%: 1.3 % (ref 0.0–7.0)
HCT: 37.6 % (ref 34.8–46.6)
HGB: 12.6 g/dL (ref 11.6–15.9)
LYMPH%: 13.1 % — AB (ref 14.0–49.7)
MCH: 31.5 pg (ref 25.1–34.0)
MCHC: 33.5 g/dL (ref 31.5–36.0)
MCV: 94 fL (ref 79.5–101.0)
MONO#: 0.7 10*3/uL (ref 0.1–0.9)
MONO%: 8 % (ref 0.0–14.0)
NEUT#: 6.5 10*3/uL (ref 1.5–6.5)
NEUT%: 77.5 % — ABNORMAL HIGH (ref 38.4–76.8)
Platelets: 210 10*3/uL (ref 145–400)
RBC: 4 10*6/uL (ref 3.70–5.45)
RDW: 14.2 % (ref 11.2–14.5)
WBC: 8.5 10*3/uL (ref 3.9–10.3)
lymph#: 1.1 10*3/uL (ref 0.9–3.3)

## 2016-03-14 LAB — COMPREHENSIVE METABOLIC PANEL
ALK PHOS: 82 U/L (ref 40–150)
ALT: 14 U/L (ref 0–55)
AST: 17 U/L (ref 5–34)
Albumin: 3.6 g/dL (ref 3.5–5.0)
Anion Gap: 10 mEq/L (ref 3–11)
BUN: 9.2 mg/dL (ref 7.0–26.0)
CO2: 26 meq/L (ref 22–29)
Calcium: 8.9 mg/dL (ref 8.4–10.4)
Chloride: 104 mEq/L (ref 98–109)
Creatinine: 0.8 mg/dL (ref 0.6–1.1)
EGFR: 78 mL/min/{1.73_m2} — AB (ref >90)
GLUCOSE: 88 mg/dL (ref 70–140)
POTASSIUM: 3.8 meq/L (ref 3.5–5.1)
SODIUM: 139 meq/L (ref 136–145)
Total Bilirubin: 0.42 mg/dL (ref 0.20–1.20)
Total Protein: 7.1 g/dL (ref 6.4–8.3)

## 2016-03-14 MED ORDER — HEPARIN SOD (PORK) LOCK FLUSH 100 UNIT/ML IV SOLN
500.0000 [IU] | Freq: Once | INTRAVENOUS | Status: AC | PRN
  Administered 2016-03-14: 500 [IU]
  Filled 2016-03-14: qty 5

## 2016-03-14 MED ORDER — ACETAMINOPHEN 325 MG PO TABS: 650.0000 mg | ORAL_TABLET | Freq: Once | ORAL | Status: DC

## 2016-03-14 MED ORDER — SODIUM CHLORIDE 0.9 % IJ SOLN
10.0000 mL | INTRAMUSCULAR | Status: DC | PRN
  Administered 2016-03-14: 10 mL
  Filled 2016-03-14: qty 10

## 2016-03-14 MED ORDER — DIPHENHYDRAMINE HCL 25 MG PO CAPS: 25.0000 mg | ORAL_CAPSULE | Freq: Once | ORAL | Status: DC

## 2016-03-14 MED ORDER — SODIUM CHLORIDE 0.9 % IV SOLN
Freq: Once | INTRAVENOUS | Status: AC
  Administered 2016-03-14: 12:00:00 via INTRAVENOUS

## 2016-03-14 MED ORDER — SODIUM CHLORIDE 0.9 % IV SOLN
6.0000 mg/kg | Freq: Once | INTRAVENOUS | Status: AC
  Administered 2016-03-14: 420 mg via INTRAVENOUS
  Filled 2016-03-14: qty 20

## 2016-03-14 NOTE — Patient Instructions (Signed)
[facility] Discharge Instructions for Patients Receiving Chemotherapy  Today you received the following chemotherapy agents:  Herceptin  To help prevent nausea and vomiting after your treatment, we encourage you to take your nausea medication as prescribed.   If you develop nausea and vomiting that is not controlled by your nausea medication, call the clinic.   BELOW ARE SYMPTOMS THAT SHOULD BE REPORTED IMMEDIATELY:  *FEVER GREATER THAN 100.5 F  *CHILLS WITH OR WITHOUT FEVER  NAUSEA AND VOMITING THAT IS NOT CONTROLLED WITH YOUR NAUSEA MEDICATION  *UNUSUAL SHORTNESS OF BREATH  *UNUSUAL BRUISING OR BLEEDING  TENDERNESS IN MOUTH AND THROAT WITH OR WITHOUT PRESENCE OF ULCERS  *URINARY PROBLEMS  *BOWEL PROBLEMS  UNUSUAL RASH Items with * indicate a potential emergency and should be followed up as soon as possible.  Feel free to call the clinic you have any questions or concerns. The clinic phone number is [phone].  Please show the CHEMO ALERT CARD at check-in to the Emergency Department and triage nurse.   

## 2016-03-15 DIAGNOSIS — J40 Bronchitis, not specified as acute or chronic: Secondary | ICD-10-CM | POA: Diagnosis not present

## 2016-03-16 ENCOUNTER — Other Ambulatory Visit: Payer: BLUE CROSS/BLUE SHIELD

## 2016-03-16 ENCOUNTER — Ambulatory Visit: Payer: BLUE CROSS/BLUE SHIELD

## 2016-04-03 NOTE — Progress Notes (Signed)
[place]  [phone]) [phone] Fax:(336) [phone]    ID: Lillie Columbia DOB: [birth year]  MR#: [id number]  OIN#:[id number]  Patient Care Team: Lollie Sails, MD as PCP - General (Internal Medicine) [place] Cage, [place] (Inactive) Kem Boroughs, FNP as Nurse Practitioner (Nurse Practitioner) Fanny Skates, MD as Consulting Physician (General Surgery) Chauncey Cruel, MD as Consulting Physician (Oncology) Thea Silversmith, MD as Consulting Physician (Radiation Oncology) Rockwell Germany, RN as Registered Nurse Mauro Kaufmann, RN as Registered Nurse Holley Bouche, NP as Nurse Practitioner (Nurse Practitioner) Kristeen Miss, MD as Consulting Physician (Neurosurgery) PCP: Lollie Sails, MD OTHER MD:  CHIEF COMPLAINT: Estrogen receptor positive breast cancer  CURRENT TREATMENT: trastuzumab; tamoxifen  BREAST CANCER HISTORY: From the initial intake note:   Brihany had routine bilateral screening mammography at the breast Center for 20 01/09/2015 showing a possible mass in the right breast. On 12/22/2014 she underwent diagnostic right mammography with tomosynthesis and right breast ultrasonography. The breast density was category C. In the right breast there was an area of increased density with architectural distortion and faint microcalcifications in the upper outer quadrant. There was mild palpable soft tissue thickening in the area in question, but no palpable right axillary lymph nodes. Ultrasound confirmed a 1.7 cm irregular hypoechoic mass. There were no abnormal-appearing right axillary lymph nodes.  Biopsy of the right breast mass in question 12/26/2014 showed ([facility] 16-08/08/2005) an invasive ductal carcinoma, grade 1 or 2, with a prognostic panel still pending.  Her subsequent history is as detailed below  INTERVAL HISTORY: Mylee returns today for follow-up of her triple positive breast cancer. She is due for repeat trastuzumab today.  Note that  she started her anti-HER-2 immunotherapy mid July 2016, and accordingly she normally would've completed this last month. However she received no treatments between early December 2016 and early March 2017 and because of a borderline drop in her ejection fraction. Accordingly the plan is to continue the trastuzumab through September of this year  REVIEW OF SYSTEMS: Her hair has come back thickening currently which she likes. She is gaining weight which she does not like. She is walking her dogs, doing some weight training, and the LAD disease, but she is not doing cardio because of concerns regarding stressing her heart muscle. She has not been able to tolerate nebivolol or any other cardiac medications that you have tried. She is mildly constipated. Hot flashes are significant problem. The gabapentin at bedtime does help. A detailed review of systems today was otherwise stable.Marland Kitchen   PAST MEDICAL HISTORY: Past Medical History:  Diagnosis Date  . ACID REFLUX DISEASE 07/12/2010  . Anxiety   . Arachnoid cyst    left frontal lobe  . Arthritis    hands  . Breast cancer ([place]) 12/2014   right  . Complication of anesthesia 2012   states woke up during colonoscopy with Propofol  . Dental crowns present   . Difficulty swallowing pills   . Glaucoma   . Hypothyroidism   . Migraines   . Nervous stomach   . Seasonal allergies   . Stress fracture of foot 12/2014   right    PAST SURGICAL HISTORY: Past Surgical History:  Procedure Laterality Date  . COLONOSCOPY WITH PROPOFOL  10/14/2013  . ESOPHAGOGASTRODUODENOSCOPY ENDOSCOPY    . LASIK    . PORTACATH PLACEMENT Left 01/23/2015   Procedure: INSERTION PORT-A-CATH ;  Surgeon: Fanny Skates, MD;  Location: [facility];  Service: General;  Laterality: Left;  .  RADIOACTIVE SEED GUIDED MASTECTOMY WITH AXILLARY SENTINEL LYMPH NODE BIOPSY Right 01/23/2015   Procedure: RIGHT PARTIAL MASTECTOMY WITH RADIOACTIVE SEED LOCALIZATION  WITH RIGHT AXILLARY  SENTINEL LYMPH NODE BIOPSY;  Surgeon: Fanny Skates, MD;  Location: [facility];  Service: General;  Laterality: Right;  . RE-EXCISION OF BREAST LUMPECTOMY Right 02/16/2015   Procedure: RE-EXCISION OF RIGHT  BREAST LUMPECTOMY MARGINS;  Surgeon: Fanny Skates, MD;  Location: [facility];  Service: General;  Laterality: Right;  . TONSILLECTOMY  age 52  . TOOTH EXTRACTION      FAMILY HISTORY Family History  Problem Relation Age of Onset  . Osteoporosis Mother   . Irritable bowel syndrome Mother   . Hypertension Mother   . Hypertension Father   . Hyperlipidemia Father   . Diabetes Father   . Dementia Father   . Colon cancer Maternal Grandfather 53  . Breast cancer Paternal Aunt   . Breast cancer Paternal Grandmother    the patient's father died at age 32 from pneumonia. [name] died at age 7 after bowel perforation. The patient had one brother, one sister. There is no history of breast or ovarian cancer in the family.  GYNECOLOGIC HISTORY:  Patient's last menstrual period was 01/25/2011. Menarche age 13, the patient is GX P0. She stopped having periods approximately 2011. She is still on hormone replacement, but is "trickling off it". She did take birth control pills for approximately 3 years remotely with no complications  SOCIAL HISTORY:  Arthur works as a Haematologist. She is divorced and lives by herself, with 2 dogs.    ADVANCED DIRECTIVES: The patient's brother [name] is her healthcare power of attorney. He may be reached in [place] at [place]: Social History  Substance Use Topics  . Smoking status: Former Smoker    Packs/day: 0.00    Years: 0.00    Quit date: 02/19/2005  . Smokeless tobacco: Never Used  . Alcohol use Yes     Comment: occasionally     Colonoscopy: February 2016  PAP: June 2015  Bone density: 12/02/2013-- T - 2.2  Lipid panel:  Allergies  Allergen Reactions  . Lortab  [Hydrocodone-Acetaminophen] Anxiety  . Milk-Related Compounds Diarrhea    GI UPSET  . Coreg [Carvedilol] Other (See Comments)    Fatigue     Current Outpatient Prescriptions  Medication Sig Dispense Refill  . bimatoprost (LUMIGAN) 0.03 % ophthalmic solution Place 1 drop into both eyes at bedtime.    . gabapentin (NEURONTIN) 300 MG capsule TAKE 1 CAPSULE(300 MG) BY MOUTH AT BEDTIME 30 capsule 3  . lidocaine-prilocaine (EMLA) cream Reported on 09/23/2015  3  . liothyronine (CYTOMEL) 5 MCG tablet Take 5 mcg by mouth 2 (two) times daily.    Marland Kitchen omeprazole (PRILOSEC) 40 MG capsule Take 1 capsule (40 mg total) by mouth daily. 30 capsule 2  . tamoxifen (NOLVADEX) 20 MG tablet Take 20 mg by mouth daily.     No current facility-administered medications for this visit.     OBJECTIVE: Middle-aged white woman Who appears stated age 45:   04/04/16 0909  BP: 126/77  Pulse: 77  Resp: 18  Temp: 98 F (36.7 C)     Body mass index is 25.91 kg/m.    ECOG FS:1 - Symptomatic but completely ambulatory  Sclerae unicteric, pupils round and equal Oropharynx clear and moist-- no thrush or other lesions No cervical or supraclavicular adenopathy Lungs no rales or rhonchi Heart regular rate  and rhythm Abd soft, nontender, positive bowel sounds MSK no focal spinal tenderness, no upper extremity lymphedema Neuro: nonfocal, well oriented, appropriate affect Breasts: The right breast is status post lumpectomy and radiation. There is no evidence of local recurrence. The right axilla is benign. The left breast is unremarkable.   LAB RESULTS:  CMP     Component Value Date/Time   NA 139 03/14/2016 1103   K 3.8 03/14/2016 1103   CL 108 01/21/2009 0828   CO2 26 03/14/2016 1103   GLUCOSE 88 03/14/2016 1103   BUN 9.2 03/14/2016 1103   CREATININE 0.8 03/14/2016 1103   CALCIUM 8.9 03/14/2016 1103   PROT 7.1 03/14/2016 1103   ALBUMIN 3.6 03/14/2016 1103   AST 17 03/14/2016 1103   ALT 14 03/14/2016 1103     ALKPHOS 82 03/14/2016 1103   BILITOT 0.42 03/14/2016 1103   GFRNONAA 95.01 01/21/2009 0828    INo results found for: SPEP, UPEP  Lab Results  Component Value Date   WBC 3.4 (L) 04/04/2016   NEUTROABS 2.0 04/04/2016   HGB 12.1 04/04/2016   HCT 36.4 04/04/2016   MCV 94.8 04/04/2016   PLT 190 04/04/2016      Chemistry      Component Value Date/Time   NA 139 03/14/2016 1103   K 3.8 03/14/2016 1103   CL 108 01/21/2009 0828   CO2 26 03/14/2016 1103   BUN 9.2 03/14/2016 1103   CREATININE 0.8 03/14/2016 1103      Component Value Date/Time   CALCIUM 8.9 03/14/2016 1103   ALKPHOS 82 03/14/2016 1103   AST 17 03/14/2016 1103   ALT 14 03/14/2016 1103   BILITOT 0.42 03/14/2016 1103       No results found for: LABCA2  No components found for: LABCA125  No results for input(s): INR in the last 168 hours.  Urinalysis    Component Value Date/Time   COLORURINE yellow 10/05/2009 1454   APPEARANCEUR Clear 10/05/2009 1454   LABSPEC 1.010 10/05/2009 1454   PHURINE 7.0 10/05/2009 1454   HGBUR 2+ 10/05/2009 1454   BILIRUBINUR neg 04/13/2015 1605   PROTEINUR neg 04/13/2015 1605   UROBILINOGEN negative 04/13/2015 1605   UROBILINOGEN 0.2 10/05/2009 1454   NITRITE neg 04/13/2015 1605   NITRITE negative 10/05/2009 1454   LEUKOCYTESUR Negative 04/13/2015 1605    STUDIES: Transthoracic Echocardiography  Patient:    Marella, Vanderpol MR #:       [id number] Study Date: 03/07/2016 Gender:     F Age:        42 Height:     166.4 cm Weight:     70.9 kg BSA:        1.82 m^2 Pt. Status: Room:   ATTENDING    Loralie Champagne, M.D.  ORDERING     Loralie Champagne, M.D.  PERFORMING   [facility], Outpatient  REFERRING    Boelter, Genelle Gather  SONOGRAPHER  Madelin Rear, RDCS  cc:  ------------------------------------------------------------------- LV EF: 50% -   55%  ASSESSMENT: 55 y.o. [place] woman status post right breast biopsy 12/26/2014 for a clinical T1 cN0, stage IA invasive  ductal carcinoma, grade 1 or 2, estrogen and progesterone receptor positive, with an MIB-1 of 11%, and HER-2 amplified, with a signals ratio of 2.07, number per cell 2.28.  (1) status post right lumpectomy and sentinel lymph node sampling 01/23/2015 for a pT1c pN1, stage IIA invasive ductal carcinoma, grade 1, with focally positive anterior margins  (a) additional surgery 02/16/2015 found residual disease  but cleared margins  (2) chemotherapy started 03/05/2015, consisting of doxorubicin and cyclophosphamide in dose dense fashion 4, completed 04/16/2015, followed by paclitaxel weekly x 12 planned cycles, started 04/30/2015,  with trastuzumab and pertuzumab given every 3 weeks.  (a) Paclitaxel stopped after 4 cycles because of progressive neuropathy, last dose 05/28/2015  (b) pertuzumab stopped after 2 cycles (when paclitaxel stopped)  (3) trastuzumab will be continued to complete 1 year (through August 2017)  (a) echocardiogram 08/19/2016 shows a >10% drop-- trastuzumab held after 07/30/2015 dose  (b) repeat echocardiogram 10/12/2015 shows EF recovery to 55%, trastuzumab resumed 10/22/2015  (c) echocardiogram 03/07/2016 shows a stable ejection fraction  (4) adjuvant radiation 07/06/2015-08/21/2015::  Right breast / 45 Gray @ 1.8 Pearline Cables per fraction x 25 fractions Right supraclavicular fossa/PAB 45 Gy _0 .8 Gy per fraction x 25 fractions Right breast boost / 16 Gray at [employer] per fraction x 8 fractions  (5) started tamoxifen 09/10/2015  PLAN: Joesphine missed some Herceptin treatments so she will continue 2 more doses, which will take her to the end of September. That'll complete her adjuvant anti-HER-2 therapy.  I am allowing her to proceed to cardio exercise if she wishes. However if she really wants to control her weight she is going to have to cut back on calories and that means cutting back on carbohydrates. She is considering a vegetarian diet. That might do it as well.  She has slight  plantar fasciitis symptoms. We discussed how to manage that. She also has hot flashes from the tamoxifen. I offered her venlafaxine but she is very reluctant to take anything that might possibly increase her weight.  She will see her gynecologist later this month. She will see me again early November. At that time I will start seeing her on a every 6 month basis  She knows to call for any problems that may develop before that visit.   Chauncey Cruel, MD   04/04/2016 9:19 AM

## 2016-04-04 ENCOUNTER — Ambulatory Visit (HOSPITAL_BASED_OUTPATIENT_CLINIC_OR_DEPARTMENT_OTHER): Payer: BLUE CROSS/BLUE SHIELD | Admitting: Oncology

## 2016-04-04 ENCOUNTER — Ambulatory Visit: Payer: BLUE CROSS/BLUE SHIELD | Admitting: Oncology

## 2016-04-04 ENCOUNTER — Other Ambulatory Visit: Payer: BLUE CROSS/BLUE SHIELD

## 2016-04-04 ENCOUNTER — Ambulatory Visit (HOSPITAL_BASED_OUTPATIENT_CLINIC_OR_DEPARTMENT_OTHER): Payer: BLUE CROSS/BLUE SHIELD

## 2016-04-04 ENCOUNTER — Telehealth: Payer: Self-pay | Admitting: Hematology and Oncology

## 2016-04-04 ENCOUNTER — Other Ambulatory Visit (HOSPITAL_BASED_OUTPATIENT_CLINIC_OR_DEPARTMENT_OTHER): Payer: BLUE CROSS/BLUE SHIELD

## 2016-04-04 VITALS — BP 126/77 | HR 77 | Temp 98.0°F | Resp 18 | Ht 65.5 in | Wt 158.1 lb

## 2016-04-04 DIAGNOSIS — C50411 Malignant neoplasm of upper-outer quadrant of right female breast: Secondary | ICD-10-CM

## 2016-04-04 DIAGNOSIS — Z170 Estrogen receptor positive status [ER+]: Secondary | ICD-10-CM | POA: Diagnosis not present

## 2016-04-04 DIAGNOSIS — Z5112 Encounter for antineoplastic immunotherapy: Secondary | ICD-10-CM | POA: Diagnosis not present

## 2016-04-04 LAB — COMPREHENSIVE METABOLIC PANEL
ALK PHOS: 74 U/L (ref 40–150)
ALT: 25 U/L (ref 0–55)
AST: 24 U/L (ref 5–34)
Albumin: 3.3 g/dL — ABNORMAL LOW (ref 3.5–5.0)
Anion Gap: 8 mEq/L (ref 3–11)
BUN: 7.2 mg/dL (ref 7.0–26.0)
CO2: 26 meq/L (ref 22–29)
Calcium: 9 mg/dL (ref 8.4–10.4)
Chloride: 106 mEq/L (ref 98–109)
Creatinine: 0.8 mg/dL (ref 0.6–1.1)
EGFR: 87 mL/min/{1.73_m2} — AB (ref >90)
GLUCOSE: 100 mg/dL (ref 70–140)
POTASSIUM: 4.3 meq/L (ref 3.5–5.1)
SODIUM: 140 meq/L (ref 136–145)
Total Bilirubin: 0.48 mg/dL (ref 0.20–1.20)
Total Protein: 6.6 g/dL (ref 6.4–8.3)

## 2016-04-04 LAB — CBC WITH DIFFERENTIAL/PLATELET
BASO%: 0.3 % (ref 0.0–2.0)
BASOS ABS: 0 10*3/uL (ref 0.0–0.1)
EOS ABS: 0.1 10*3/uL (ref 0.0–0.5)
EOS%: 2.6 % (ref 0.0–7.0)
HCT: 36.4 % (ref 34.8–46.6)
HGB: 12.1 g/dL (ref 11.6–15.9)
LYMPH%: 27.6 % (ref 14.0–49.7)
MCH: 31.5 pg (ref 25.1–34.0)
MCHC: 33.2 g/dL (ref 31.5–36.0)
MCV: 94.8 fL (ref 79.5–101.0)
MONO#: 0.4 10*3/uL (ref 0.1–0.9)
MONO%: 11.4 % (ref 0.0–14.0)
NEUT#: 2 10*3/uL (ref 1.5–6.5)
NEUT%: 58.1 % (ref 38.4–76.8)
Platelets: 190 10*3/uL (ref 145–400)
RBC: 3.84 10*6/uL (ref 3.70–5.45)
RDW: 14.2 % (ref 11.2–14.5)
WBC: 3.4 10*3/uL — ABNORMAL LOW (ref 3.9–10.3)
lymph#: 0.9 10*3/uL (ref 0.9–3.3)

## 2016-04-04 MED ORDER — TRASTUZUMAB CHEMO 150 MG IV SOLR
6.0000 mg/kg | Freq: Once | INTRAVENOUS | Status: AC
  Administered 2016-04-04: 420 mg via INTRAVENOUS
  Filled 2016-04-04: qty 20

## 2016-04-04 MED ORDER — SODIUM CHLORIDE 0.9 % IV SOLN
Freq: Once | INTRAVENOUS | Status: AC
  Administered 2016-04-04: 10:00:00 via INTRAVENOUS

## 2016-04-04 MED ORDER — SODIUM CHLORIDE 0.9 % IJ SOLN
10.0000 mL | INTRAMUSCULAR | Status: DC | PRN
  Administered 2016-04-04: 10 mL
  Filled 2016-04-04: qty 10

## 2016-04-04 MED ORDER — HEPARIN SOD (PORK) LOCK FLUSH 100 UNIT/ML IV SOLN
500.0000 [IU] | Freq: Once | INTRAVENOUS | Status: AC | PRN
  Administered 2016-04-04: 500 [IU]
  Filled 2016-04-04: qty 5

## 2016-04-04 NOTE — Telephone Encounter (Signed)
appt made and avs printed °

## 2016-04-04 NOTE — Patient Instructions (Signed)
Cancer Center Discharge Instructions for Patients Receiving Chemotherapy  Today you received the following chemotherapy agents; Herceptin     BELOW ARE SYMPTOMS THAT SHOULD BE REPORTED IMMEDIATELY:  *FEVER GREATER THAN 100.5 F  *CHILLS WITH OR WITHOUT FEVER  NAUSEA AND VOMITING THAT IS NOT CONTROLLED WITH YOUR NAUSEA MEDICATION  *UNUSUAL SHORTNESS OF BREATH  *UNUSUAL BRUISING OR BLEEDING  TENDERNESS IN MOUTH AND THROAT WITH OR WITHOUT PRESENCE OF ULCERS  *URINARY PROBLEMS  *BOWEL PROBLEMS  UNUSUAL RASH Items with * indicate a potential emergency and should be followed up as soon as possible.  Feel free to call the clinic you have any questions or concerns. The clinic phone number is [phone].  Please show the CHEMO ALERT CARD at check-in to the Emergency Department and triage nurse.   

## 2016-04-04 NOTE — Progress Notes (Signed)
Pt refuses tylenol and benadryl. States she "never takes it"

## 2016-04-18 ENCOUNTER — Ambulatory Visit (INDEPENDENT_AMBULATORY_CARE_PROVIDER_SITE_OTHER): Payer: BLUE CROSS/BLUE SHIELD | Admitting: Nurse Practitioner

## 2016-04-18 ENCOUNTER — Encounter: Payer: Self-pay | Admitting: Nurse Practitioner

## 2016-04-18 VITALS — BP 108/66 | HR 80 | Ht 66.0 in | Wt 158.0 lb

## 2016-04-18 DIAGNOSIS — C50411 Malignant neoplasm of upper-outer quadrant of right female breast: Secondary | ICD-10-CM

## 2016-04-18 DIAGNOSIS — Z9223 Personal history of estrogen therapy: Secondary | ICD-10-CM

## 2016-04-18 DIAGNOSIS — Z01419 Encounter for gynecological examination (general) (routine) without abnormal findings: Secondary | ICD-10-CM

## 2016-04-18 DIAGNOSIS — Z1151 Encounter for screening for human papillomavirus (HPV): Secondary | ICD-10-CM | POA: Diagnosis not present

## 2016-04-18 DIAGNOSIS — Z8639 Personal history of other endocrine, nutritional and metabolic disease: Secondary | ICD-10-CM

## 2016-04-18 DIAGNOSIS — Z0000 Encounter for general adult medical examination without abnormal findings: Secondary | ICD-10-CM

## 2016-04-18 LAB — POCT URINALYSIS DIPSTICK
BILIRUBIN UA: NEGATIVE
Blood, UA: NEGATIVE
GLUCOSE UA: NEGATIVE
Ketones, UA: NEGATIVE
NITRITE UA: NEGATIVE
Protein, UA: NEGATIVE
Urobilinogen, UA: NEGATIVE
pH, UA: 8

## 2016-04-18 LAB — HIV ANTIBODY (ROUTINE TESTING W REFLEX): HIV: NONREACTIVE

## 2016-04-18 NOTE — Patient Instructions (Addendum)

## 2016-04-18 NOTE — Progress Notes (Addendum)
Patient ID: Bonnie Ward, female   DOB: 1961/08/15, 55 y.o.   MRN: [id number]  55 y.o. G0P0000 Divorced  Caucasian Fe here for annual exam. Pt was on Bio identical HRT until 12/2014 when she was diagnosed with right breast cancer  Right lumpectomy 01/23/15, chemo and radiation.  And still gets Herceptin every 3 weeks.  She has 2 more treatments of that left and will have port removed.  Not dating or SA.  She feels extremely dry vaginally and the Tamoxifen causes her to have a vaginal discharge that has an odor at times.  Patient's last menstrual period was 01/25/2011.          Sexually active: No.  The current method of family planning is none.    Exercising: Yes.    Gym/ health club routine includes light weights and spin class, pilates, and walking. Smoker:  no  Health Maintenance: Pap: 04/13/15, Negative with pos HR HPV, negative #16/18 MMG:01/25/16, 3D, Bilateral Diagnostic, Bi-Rads 2: Benign; Bilateral Diagnostic in one year Colonoscopy: 10/14/2013 wnl biopsy, repeat in 5 yeas BMD: 12/02/2013 Spine -1.8, Hip -2.2  TDaP: 08/23/2011 Pneumonia: Not indicated due to age Hep C and HIV: done today Labs: [facility]  Urine: trace leuk's   reports that she quit smoking about 11 years ago. She smoked 0.00 packs per day for 0.00 years. She has never used smokeless tobacco. She reports that she drinks alcohol. She reports that she does not use drugs.  Past Medical History:  Diagnosis Date  . ACID REFLUX DISEASE 07/12/2010  . Anxiety   . Arachnoid cyst    left frontal lobe  . Arthritis    hands  . Breast cancer ([place]) 12/2014   right  . Complication of anesthesia 2012   states woke up during colonoscopy with Propofol  . Dental crowns present   . Difficulty swallowing pills   . Glaucoma   . Hypothyroidism   . Migraines   . Nervous stomach   . Seasonal allergies   . Stress fracture of foot 12/2014   right    Past Surgical History:  Procedure Laterality Date  . COLONOSCOPY WITH  PROPOFOL  10/14/2013  . ESOPHAGOGASTRODUODENOSCOPY ENDOSCOPY    . LASIK    . PORTACATH PLACEMENT Left 01/23/2015   Procedure: INSERTION PORT-A-CATH ;  Surgeon: Fanny Skates, MD;  Location: [facility];  Service: General;  Laterality: Left;  . RADIOACTIVE SEED GUIDED MASTECTOMY WITH AXILLARY SENTINEL LYMPH NODE BIOPSY Right 01/23/2015   Procedure: RIGHT PARTIAL MASTECTOMY WITH RADIOACTIVE SEED LOCALIZATION  WITH RIGHT AXILLARY SENTINEL LYMPH NODE BIOPSY;  Surgeon: Fanny Skates, MD;  Location: [facility];  Service: General;  Laterality: Right;  . RE-EXCISION OF BREAST LUMPECTOMY Right 02/16/2015   Procedure: RE-EXCISION OF RIGHT  BREAST LUMPECTOMY MARGINS;  Surgeon: Fanny Skates, MD;  Location: [facility];  Service: General;  Laterality: Right;  . TONSILLECTOMY  age 55  . TOOTH EXTRACTION      Current Outpatient Prescriptions  Medication Sig Dispense Refill  . bimatoprost (LUMIGAN) 0.03 % ophthalmic solution Place 1 drop into both eyes at bedtime.    . gabapentin (NEURONTIN) 300 MG capsule TAKE 1 CAPSULE(300 MG) BY MOUTH AT BEDTIME 30 capsule 3  . lidocaine-prilocaine (EMLA) cream Reported on 09/23/2015  3  . liothyronine (CYTOMEL) 5 MCG tablet Take 5 mcg by mouth 2 (two) times daily.    Marland Kitchen omeprazole (PRILOSEC) 40 MG capsule Take 1 capsule (40 mg total) by mouth daily.  30 capsule 2  . tamoxifen (NOLVADEX) 20 MG tablet Take 20 mg by mouth daily.     No current facility-administered medications for this visit.     Family History  Problem Relation Age of Onset  . Osteoporosis Mother   . Irritable bowel syndrome Mother   . Hypertension Mother   . Hypertension Father   . Hyperlipidemia Father   . Diabetes Father   . Dementia Father   . Colon cancer Maternal Grandfather 78  . Breast cancer Paternal Aunt   . Breast cancer Paternal Grandmother     ROS:  Pertinent items are noted in HPI.  Otherwise, a comprehensive ROS was negative.  Exam:   LMP  01/25/2011    Ht Readings from Last 3 Encounters:  04/04/16 5' 5.5" (1.664 m)  10/22/15 5' 5.5" (1.664 m)  09/23/15 5' 5.5" (1.664 m)    General appearance: alert, cooperative and appears stated age Head: Normocephalic, without obvious abnormality, atraumatic Neck: no adenopathy, supple, symmetrical, trachea midline and thyroid normal to inspection and palpation Lungs: clear to auscultation bilaterally Breasts: normal appearance, no masses or tenderness, on the left.  Right breast surgical and radiation changes without a new masss. Heart: regular rate and rhythm .   Port left chest wall. Abdomen: soft, non-tender; no masses,  no organomegaly Extremities: extremities normal, atraumatic, no cyanosis or edema Skin: Skin color, texture, turgor normal. No rashes or lesions Lymph nodes: Cervical, supraclavicular, and axillary nodes normal. No abnormal inguinal nodes palpated Neurologic: Grossly normal   Pelvic: External genitalia:  no lesions              Urethra:  All atrophic appearing urethra with no masses, tenderness or lesions              Bartholin's and Skene's: normal                 Vagina: very atrophic appearing vagina with pale color and discharge, no lesions              Cervix: anteverted              Pap taken: Yes.   Bimanual Exam:  Uterus:  normal size, contour, position, consistency, mobility, non-tender              Adnexa: no mass, fullness, tenderness               Rectovaginal: Confirms               Anus:  normal sphincter tone, no lesions  Chaperone present: yes  A:  Well Woman with normal exam  Postmenopausal on HRT until 12/2014 secondary to breast cancer diagnosis             Hypothyroid on replacement              History of migraine HA's  New diagnosis of right breast cancer 12/2014 with right lumpectomy 01/23/15 - treated with chemo and radiation  History of normal pap with + HR HPV 8/16 - with negative #16;18;45.    P:   Reviewed health and wellness  pertinent to exam  Pap smear is done  Mammogram is due 6/18, order placed for BMD to be done in December.  Will follow with labs  She has a sister and will ask about BRCA testing  Discussed vaginal atrophy and using coconut oil twice a day to resolve a lot of issues.  Counseled on breast self exam, mammography screening, adequate  intake of calcium and vitamin D, diet and exercise, Kegel's exercises return annually or prn  An After Visit Summary was printed and given to the patient.

## 2016-04-19 LAB — HEPATITIS C ANTIBODY: HCV AB: NEGATIVE

## 2016-04-20 NOTE — Progress Notes (Signed)
Reviewed personally.  M. Suzanne Luman Holway, MD.  

## 2016-04-21 LAB — IPS PAP TEST WITH HPV

## 2016-04-22 ENCOUNTER — Telehealth: Payer: Self-pay

## 2016-04-22 NOTE — Telephone Encounter (Signed)
Started in error. -sco

## 2016-05-02 ENCOUNTER — Ambulatory Visit (HOSPITAL_BASED_OUTPATIENT_CLINIC_OR_DEPARTMENT_OTHER): Payer: BLUE CROSS/BLUE SHIELD

## 2016-05-02 ENCOUNTER — Other Ambulatory Visit (HOSPITAL_BASED_OUTPATIENT_CLINIC_OR_DEPARTMENT_OTHER): Payer: BLUE CROSS/BLUE SHIELD

## 2016-05-02 VITALS — BP 129/74 | HR 75 | Temp 98.2°F | Resp 18

## 2016-05-02 DIAGNOSIS — C50411 Malignant neoplasm of upper-outer quadrant of right female breast: Secondary | ICD-10-CM

## 2016-05-02 DIAGNOSIS — Z5112 Encounter for antineoplastic immunotherapy: Secondary | ICD-10-CM | POA: Diagnosis not present

## 2016-05-02 LAB — CBC WITH DIFFERENTIAL/PLATELET
BASO%: 0.2 % (ref 0.0–2.0)
BASOS ABS: 0 10*3/uL (ref 0.0–0.1)
EOS ABS: 0.1 10*3/uL (ref 0.0–0.5)
EOS%: 1.6 % (ref 0.0–7.0)
HCT: 38.4 % (ref 34.8–46.6)
HGB: 13 g/dL (ref 11.6–15.9)
LYMPH%: 27.1 % (ref 14.0–49.7)
MCH: 32.1 pg (ref 25.1–34.0)
MCHC: 33.9 g/dL (ref 31.5–36.0)
MCV: 94.8 fL (ref 79.5–101.0)
MONO#: 0.5 10*3/uL (ref 0.1–0.9)
MONO%: 8.9 % (ref 0.0–14.0)
NEUT%: 62.2 % (ref 38.4–76.8)
NEUTROS ABS: 3.2 10*3/uL (ref 1.5–6.5)
PLATELETS: 236 10*3/uL (ref 145–400)
RBC: 4.05 10*6/uL (ref 3.70–5.45)
RDW: 14.1 % (ref 11.2–14.5)
WBC: 5.2 10*3/uL (ref 3.9–10.3)
lymph#: 1.4 10*3/uL (ref 0.9–3.3)

## 2016-05-02 LAB — COMPREHENSIVE METABOLIC PANEL
ALBUMIN: 3.7 g/dL (ref 3.5–5.0)
ALK PHOS: 76 U/L (ref 40–150)
ALT: 20 U/L (ref 0–55)
ANION GAP: 10 meq/L (ref 3–11)
AST: 23 U/L (ref 5–34)
BILIRUBIN TOTAL: 0.34 mg/dL (ref 0.20–1.20)
BUN: 10.8 mg/dL (ref 7.0–26.0)
CO2: 25 meq/L (ref 22–29)
Calcium: 9.1 mg/dL (ref 8.4–10.4)
Chloride: 104 mEq/L (ref 98–109)
Creatinine: 0.9 mg/dL (ref 0.6–1.1)
EGFR: 77 mL/min/{1.73_m2} — AB (ref >90)
Glucose: 103 mg/dl (ref 70–140)
POTASSIUM: 4.1 meq/L (ref 3.5–5.1)
Sodium: 140 mEq/L (ref 136–145)
TOTAL PROTEIN: 7.4 g/dL (ref 6.4–8.3)

## 2016-05-02 MED ORDER — SODIUM CHLORIDE 0.9 % IV SOLN
Freq: Once | INTRAVENOUS | Status: AC
  Administered 2016-05-02: 15:00:00 via INTRAVENOUS

## 2016-05-02 MED ORDER — ACETAMINOPHEN 325 MG PO TABS: 650.0000 mg | ORAL_TABLET | Freq: Once | ORAL | Status: DC

## 2016-05-02 MED ORDER — DIPHENHYDRAMINE HCL 25 MG PO CAPS: 25.0000 mg | ORAL_CAPSULE | Freq: Once | ORAL | Status: DC

## 2016-05-02 MED ORDER — TRASTUZUMAB CHEMO INJECTION 440 MG
6.0000 mg/kg | Freq: Once | INTRAVENOUS | Status: AC
  Administered 2016-05-02: 420 mg via INTRAVENOUS
  Filled 2016-05-02: qty 20

## 2016-05-02 MED ORDER — SODIUM CHLORIDE 0.9 % IJ SOLN
10.0000 mL | INTRAMUSCULAR | Status: DC | PRN
  Administered 2016-05-02: 10 mL
  Filled 2016-05-02: qty 10

## 2016-05-02 MED ORDER — HEPARIN SOD (PORK) LOCK FLUSH 100 UNIT/ML IV SOLN
500.0000 [IU] | Freq: Once | INTRAVENOUS | Status: AC | PRN
  Administered 2016-05-02: 500 [IU]
  Filled 2016-05-02: qty 5

## 2016-05-02 NOTE — Patient Instructions (Signed)
[facility] Discharge Instructions for Patients Receiving Chemotherapy  Today you received the following chemotherapy agents: Herceptin   To help prevent nausea and vomiting after your treatment, we encourage you to take your nausea medication as directed.    If you develop nausea and vomiting that is not controlled by your nausea medication, call the clinic.   BELOW ARE SYMPTOMS THAT SHOULD BE REPORTED IMMEDIATELY:  *FEVER GREATER THAN 100.5 F  *CHILLS WITH OR WITHOUT FEVER  NAUSEA AND VOMITING THAT IS NOT CONTROLLED WITH YOUR NAUSEA MEDICATION  *UNUSUAL SHORTNESS OF BREATH  *UNUSUAL BRUISING OR BLEEDING  TENDERNESS IN MOUTH AND THROAT WITH OR WITHOUT PRESENCE OF ULCERS  *URINARY PROBLEMS  *BOWEL PROBLEMS  UNUSUAL RASH Items with * indicate a potential emergency and should be followed up as soon as possible.  Feel free to call the clinic you have any questions or concerns. The clinic phone number is [phone].  Please show the CHEMO ALERT CARD at check-in to the Emergency Department and triage nurse.   

## 2016-05-11 ENCOUNTER — Other Ambulatory Visit: Payer: Self-pay | Admitting: Neurological Surgery

## 2016-05-11 DIAGNOSIS — G930 Cerebral cysts: Secondary | ICD-10-CM

## 2016-05-13 ENCOUNTER — Other Ambulatory Visit: Payer: Self-pay | Admitting: *Deleted

## 2016-05-13 DIAGNOSIS — K219 Gastro-esophageal reflux disease without esophagitis: Secondary | ICD-10-CM

## 2016-05-13 MED ORDER — OMEPRAZOLE 40 MG PO CPDR: 40.0000 mg | DELAYED_RELEASE_CAPSULE | Freq: Every day | ORAL | 2 refills | Status: DC

## 2016-05-14 DIAGNOSIS — Z23 Encounter for immunization: Secondary | ICD-10-CM | POA: Diagnosis not present

## 2016-05-16 ENCOUNTER — Other Ambulatory Visit: Payer: Self-pay | Admitting: *Deleted

## 2016-05-16 ENCOUNTER — Telehealth (HOSPITAL_COMMUNITY): Payer: Self-pay | Admitting: Vascular Surgery

## 2016-05-16 DIAGNOSIS — K219 Gastro-esophageal reflux disease without esophagitis: Secondary | ICD-10-CM | POA: Diagnosis not present

## 2016-05-16 DIAGNOSIS — G930 Cerebral cysts: Secondary | ICD-10-CM | POA: Diagnosis not present

## 2016-05-16 DIAGNOSIS — Z87891 Personal history of nicotine dependence: Secondary | ICD-10-CM | POA: Diagnosis not present

## 2016-05-16 DIAGNOSIS — C50411 Malignant neoplasm of upper-outer quadrant of right female breast: Secondary | ICD-10-CM | POA: Diagnosis not present

## 2016-05-16 MED ORDER — OMEPRAZOLE 40 MG PO CPDR: 40.0000 mg | DELAYED_RELEASE_CAPSULE | Freq: Every day | ORAL | 2 refills | Status: DC

## 2016-05-16 NOTE — Telephone Encounter (Signed)
Left pt message to make appt w/ dr/echo

## 2016-05-19 ENCOUNTER — Other Ambulatory Visit: Payer: Self-pay | Admitting: Oncology

## 2016-05-19 ENCOUNTER — Other Ambulatory Visit: Payer: Self-pay | Admitting: *Deleted

## 2016-05-19 DIAGNOSIS — C50411 Malignant neoplasm of upper-outer quadrant of right female breast: Secondary | ICD-10-CM

## 2016-05-19 NOTE — Progress Notes (Signed)
[facility]  [phone] [phone]    ID: Bonnie Ward DOB: 11-Nov-1960  MR#: [id number]  OEH#:[id number]  Patient Care Team: Lollie Sails, MD as PCP - General (Internal Medicine) [place]age, [place] (Inactive) Kem Boroughs, FNP as Nurse Practitioner (Nurse Practitioner) Fanny Skates, MD as Consulting Physician (General Surgery) Chauncey Cruel, MD as Consulting Physician (Oncology) Thea Silversmith, MD as Consulting Physician (Radiation Oncology) Rockwell Germany, RN as Registered Nurse Mauro Kaufmann, RN as Registered Nurse Holley Bouche, NP as Nurse Practitioner (Nurse Practitioner) Kristeen Miss, MD as Consulting Physician (Neurosurgery) PCP: Lollie Sails, MD OTHER MD:  CHIEF COMPLAINT: Estrogen receptor positive breast cancer  CURRENT TREATMENT: trastuzumab; tamoxifen  BREAST CANCER HISTORY: From the initial intake note:   Bonnie Ward had routine bilateral screening mammography at the [facility] Center for [facility] 01/09/2015 showing a possible mass in the right breast. On 12/22/2014 she underwent diagnostic right mammography with tomosynthesis and right breast ultrasonography. The breast density was category C. In the right breast there was an area of increased density with architectural distortion and faint microcalcifications in the upper outer quadrant. There was mild palpable soft tissue thickening in the area in question, but no palpable right axillary lymph nodes. Ultrasound confirmed a 1.7 cm irregular hypoechoic mass. There were no abnormal-appearing right axillary lymph nodes.  Biopsy of the right breast mass in question 12/26/2014 showed ([facility] 16-08/08/2005) an invasive ductal carcinoma, grade 1 or 2, with a prognostic panel still pending.  Her subsequent history is as detailed below  INTERVAL HISTORY: Bonnie Ward returns today for follow-up of her triple positive breast cancer. She is due for repeat trastuzumab today.  Note that  she started her anti-HER-2 immunotherapy mid July 2016, and accordingly she normally would've completed this last month. However she received no treatments between early December 2016 and early March 2017 and because of a borderline drop in her ejection fraction. Accordingly the plan is to continue the trastuzumab through September of this year  REVIEW OF SYSTEMS: Her hair has come back thickening currently which she likes. She is gaining weight which she does not like. She is walking her dogs, doing some weight training, and the LAD disease, but she is not doing cardio because of concerns regarding stressing her heart muscle. She has not been able to tolerate nebivolol or any other cardiac medications that you have tried. She is mildly constipated. Hot flashes are significant problem. The gabapentin at bedtime does help. A detailed review of systems today was otherwise stable.Marland Kitchen   PAST MEDICAL HISTORY: Past Medical History:  Diagnosis Date  . ACID REFLUX DISEASE 07/12/2010  . Anxiety   . Arachnoid cyst    left frontal lobe  . Arthritis    hands  . Breast cancer ([place]) 12/2014   right  . Complication of anesthesia 2012   states woke up during colonoscopy with Propofol  . Dental crowns present   . Difficulty swallowing pills   . Glaucoma   . Hypothyroidism   . Migraines   . Nervous stomach   . Seasonal allergies   . Stress fracture of foot 12/2014   right    PAST SURGICAL HISTORY: Past Surgical History:  Procedure Laterality Date  . COLONOSCOPY WITH PROPOFOL  10/14/2013  . ESOPHAGOGASTRODUODENOSCOPY ENDOSCOPY    . LASIK    . PORTACATH PLACEMENT Left 01/23/2015   Procedure: INSERTION PORT-A-CATH ;  Surgeon: Fanny Skates, MD;  Location: [facility];  Service: General;  Laterality: Left;  .  RADIOACTIVE SEED GUIDED MASTECTOMY WITH AXILLARY SENTINEL LYMPH NODE BIOPSY Right 01/23/2015   Procedure: RIGHT PARTIAL MASTECTOMY WITH RADIOACTIVE SEED LOCALIZATION  WITH RIGHT AXILLARY  SENTINEL LYMPH NODE BIOPSY;  Surgeon: Fanny Skates, MD;  Location: [facility];  Service: General;  Laterality: Right;  . RE-EXCISION OF BREAST LUMPECTOMY Right 02/16/2015   Procedure: RE-EXCISION OF RIGHT  BREAST LUMPECTOMY MARGINS;  Surgeon: Fanny Skates, MD;  Location: [facility];  Service: General;  Laterality: Right;  . TONSILLECTOMY  age 15  . TOOTH EXTRACTION      FAMILY HISTORY Family History  Problem Relation Age of Onset  . Osteoporosis Mother   . Irritable bowel syndrome Mother   . Hypertension Mother   . Hypertension Father   . Hyperlipidemia Father   . Diabetes Father   . Dementia Father   . Colon cancer Maternal Grandfather 15  . Breast cancer Paternal Aunt   . Breast cancer Paternal Grandmother    the patient's father died at age 6 from pneumonia. [name] died at age 12 after bowel perforation. The patient had one brother, one sister. There is no history of breast or ovarian cancer in the family.  GYNECOLOGIC HISTORY:  Patient's last menstrual period was 01/25/2011. Menarche age 37, the patient is GX P0. She stopped having periods approximately 2011. She is still on hormone replacement, but is "trickling off it". She did take birth control pills for approximately 3 years remotely with no complications  SOCIAL HISTORY:  Bonnie Ward works as a Haematologist. She is divorced and lives by herself, with 2 dogs.    ADVANCED DIRECTIVES: The patient's brother Bonnie Ward is her healthcare power of attorney. He may be reached in [place] at [place]: Social History  Substance Use Topics  . Smoking status: Former Smoker    Packs/day: 0.00    Years: 0.00    Quit date: 02/19/2005  . Smokeless tobacco: Never Used  . Alcohol use Yes     Comment: occasionally     Colonoscopy: February 2016  PAP: June 2015  Bone density: 12/02/2013-- T - 2.2  Lipid panel:  Allergies  Allergen Reactions  . Lortab  [Hydrocodone-Acetaminophen] Anxiety  . Milk-Related Compounds Diarrhea    GI UPSET  . Coreg [Carvedilol] Other (See Comments)    Fatigue     Current Outpatient Prescriptions  Medication Sig Dispense Refill  . bimatoprost (LUMIGAN) 0.03 % ophthalmic solution Place 1 drop into both eyes at bedtime.    . gabapentin (NEURONTIN) 300 MG capsule TAKE 1 CAPSULE(300 MG) BY MOUTH AT BEDTIME 30 capsule 3  . lidocaine-prilocaine (EMLA) cream Reported on 09/23/2015  3  . liothyronine (CYTOMEL) 5 MCG tablet Take 5 mcg by mouth 2 (two) times daily.    Marland Kitchen omeprazole (PRILOSEC) 40 MG capsule Take 1 capsule (40 mg total) by mouth daily. 30 capsule 2  . tamoxifen (NOLVADEX) 20 MG tablet Take 20 mg by mouth daily.     No current facility-administered medications for this visit.     OBJECTIVE: Middle-aged white woman Who appears stated age There were no vitals filed for this visit.   There is no height or weight on file to calculate BMI.    ECOG FS:1 - Symptomatic but completely ambulatory  Sclerae unicteric, pupils round and equal Oropharynx clear and moist-- no thrush or other lesions No cervical or supraclavicular adenopathy Lungs no rales or rhonchi Heart regular rate and rhythm Abd soft, nontender, positive bowel sounds MSK  no focal spinal tenderness, no upper extremity lymphedema Neuro: nonfocal, well oriented, appropriate affect Breasts: The right breast is status post lumpectomy and radiation. There is no evidence of local recurrence. The right axilla is benign. The left breast is unremarkable.   LAB RESULTS:  CMP     Component Value Date/Time   NA 140 05/02/2016 1411   K 4.1 05/02/2016 1411   CL 108 01/21/2009 0828   CO2 25 05/02/2016 1411   GLUCOSE 103 05/02/2016 1411   BUN 10.8 05/02/2016 1411   CREATININE 0.9 05/02/2016 1411   CALCIUM 9.1 05/02/2016 1411   PROT 7.4 05/02/2016 1411   ALBUMIN 3.7 05/02/2016 1411   AST 23 05/02/2016 1411   ALT 20 05/02/2016 1411   ALKPHOS 76  05/02/2016 1411   BILITOT 0.34 05/02/2016 1411   GFRNONAA 95.01 01/21/2009 0828    INo results found for: SPEP, UPEP  Lab Results  Component Value Date   WBC 5.2 05/02/2016   NEUTROABS 3.2 05/02/2016   HGB 13.0 05/02/2016   HCT 38.4 05/02/2016   MCV 94.8 05/02/2016   PLT 236 05/02/2016      Chemistry      Component Value Date/Time   NA 140 05/02/2016 1411   K 4.1 05/02/2016 1411   CL 108 01/21/2009 0828   CO2 25 05/02/2016 1411   BUN 10.8 05/02/2016 1411   CREATININE 0.9 05/02/2016 1411      Component Value Date/Time   CALCIUM 9.1 05/02/2016 1411   ALKPHOS 76 05/02/2016 1411   AST 23 05/02/2016 1411   ALT 20 05/02/2016 1411   BILITOT 0.34 05/02/2016 1411       No results found for: LABCA2  No components found for: LABCA125  No results for input(s): INR in the last 168 hours.  Urinalysis    Component Value Date/Time   COLORURINE yellow 10/05/2009 1454   APPEARANCEUR Clear 10/05/2009 1454   LABSPEC 1.010 10/05/2009 1454   PHURINE 7.0 10/05/2009 1454   HGBUR 2+ 10/05/2009 1454   BILIRUBINUR neg 04/18/2016 1433   PROTEINUR neg 04/18/2016 1433   UROBILINOGEN negative 04/18/2016 1433   UROBILINOGEN 0.2 10/05/2009 1454   NITRITE neg 04/18/2016 1433   NITRITE negative 10/05/2009 1454   LEUKOCYTESUR Trace (A) 04/18/2016 1433    STUDIES: Transthoracic Echocardiography  Patient:    Denaisha, [place] MR #:       [id number] Study Date: 03/07/2016 Gender:     F Age:        30 Height:     166.4 cm Weight:     70.9 kg BSA:        1.82 m^2 Pt. Status: Room:   ATTENDING    Bonnie Ward, M.D.  ORDERING     Bonnie Ward, M.D.  PERFORMING   [facility], Outpatient  REFERRING    Boelter, Genelle Gather  SONOGRAPHER  Madelin Rear, RDCS  cc:  ------------------------------------------------------------------- LV EF: 50% -   55%  ASSESSMENT: 55 y.o. [place] woman status post right breast biopsy 12/26/2014 for a clinical T1 cN0, stage IA invasive ductal carcinoma,  grade 1 or 2, estrogen and progesterone receptor positive, with an MIB-1 of 11%, and HER-2 amplified, with a signals ratio of 2.07, number per cell 2.28.  (1) status post right lumpectomy and sentinel lymph node sampling 01/23/2015 for a pT1c pN1, stage IIA invasive ductal carcinoma, grade 1, with focally positive anterior margins  (a) additional surgery 02/16/2015 found residual disease but cleared margins  (2) chemotherapy started 03/05/2015, consisting of  doxorubicin and cyclophosphamide in dose dense fashion 4, completed 04/16/2015, followed by paclitaxel weekly x 12 planned cycles, started 04/30/2015,  with trastuzumab and pertuzumab given every 3 weeks.  (a) Paclitaxel stopped after 4 cycles because of progressive neuropathy, last dose 05/28/2015  (b) pertuzumab stopped after 2 cycles (when paclitaxel stopped)  (3) trastuzumab will be continued to complete 1 year (through August 2017)  (a) echocardiogram 08/19/2016 shows a >10% drop-- trastuzumab held after 07/30/2015 dose  (b) repeat echocardiogram 10/12/2015 shows EF recovery to 55%, trastuzumab resumed 10/22/2015  (c) echocardiogram 03/07/2016 shows a stable ejection fraction  (4) adjuvant radiation 07/06/2015-08/21/2015::  Right breast / 45 Gray @ 1.8 [place] Cables per fraction x 25 fractions Right supraclavicular fossa/PAB 45 Gy '@1' .8 Gy per fraction x 25 fractions Right breast boost / 16 Gray at [employer] per fraction x 8 fractions  (5) started tamoxifen 09/10/2015  PLAN: Ciclaly missed some Herceptin treatments so she will continue 2 more doses, which will take her to the end of September. That'll complete her adjuvant anti-HER-2 therapy.  I am allowing her to proceed to cardio exercise if she wishes. However if she really wants to control her weight she is going to have to cut back on calories and that means cutting back on carbohydrates. She is considering a vegetarian diet. That might do it as well.  She has slight plantar fasciitis  symptoms. We discussed how to manage that. She also has hot flashes from the tamoxifen. I offered her venlafaxine but she is very reluctant to take anything that might possibly increase her weight.  She will see her gynecologist later this month. She will see me again early November. At that time I will start seeing her on a every 6 month basis  She knows to call for any problems that may develop before that visit.   Chauncey Cruel, MD   05/19/2016 2:26 PM

## 2016-05-23 ENCOUNTER — Ambulatory Visit (HOSPITAL_BASED_OUTPATIENT_CLINIC_OR_DEPARTMENT_OTHER): Payer: BLUE CROSS/BLUE SHIELD

## 2016-05-23 ENCOUNTER — Other Ambulatory Visit (HOSPITAL_BASED_OUTPATIENT_CLINIC_OR_DEPARTMENT_OTHER): Payer: BLUE CROSS/BLUE SHIELD

## 2016-05-23 VITALS — BP 125/80 | HR 73 | Temp 97.7°F | Resp 18

## 2016-05-23 DIAGNOSIS — C50411 Malignant neoplasm of upper-outer quadrant of right female breast: Secondary | ICD-10-CM

## 2016-05-23 DIAGNOSIS — Z5112 Encounter for antineoplastic immunotherapy: Secondary | ICD-10-CM | POA: Diagnosis not present

## 2016-05-23 LAB — COMPREHENSIVE METABOLIC PANEL
ALBUMIN: 3.5 g/dL (ref 3.5–5.0)
ALK PHOS: 77 U/L (ref 40–150)
ALT: 17 U/L (ref 0–55)
AST: 19 U/L (ref 5–34)
Anion Gap: 9 mEq/L (ref 3–11)
BUN: 11.3 mg/dL (ref 7.0–26.0)
CO2: 28 meq/L (ref 22–29)
Calcium: 9.1 mg/dL (ref 8.4–10.4)
Chloride: 105 mEq/L (ref 98–109)
Creatinine: 0.8 mg/dL (ref 0.6–1.1)
EGFR: 86 mL/min/{1.73_m2} — AB (ref >90)
GLUCOSE: 92 mg/dL (ref 70–140)
POTASSIUM: 4.1 meq/L (ref 3.5–5.1)
SODIUM: 141 meq/L (ref 136–145)
Total Bilirubin: 0.31 mg/dL (ref 0.20–1.20)
Total Protein: 6.8 g/dL (ref 6.4–8.3)

## 2016-05-23 LAB — CBC WITH DIFFERENTIAL/PLATELET
BASO%: 0.3 % (ref 0.0–2.0)
BASOS ABS: 0 10*3/uL (ref 0.0–0.1)
EOS ABS: 0.1 10*3/uL (ref 0.0–0.5)
EOS%: 2.3 % (ref 0.0–7.0)
HCT: 38.3 % (ref 34.8–46.6)
HEMOGLOBIN: 12.8 g/dL (ref 11.6–15.9)
LYMPH%: 24.5 % (ref 14.0–49.7)
MCH: 31.7 pg (ref 25.1–34.0)
MCHC: 33.3 g/dL (ref 31.5–36.0)
MCV: 95.1 fL (ref 79.5–101.0)
MONO#: 0.5 10*3/uL (ref 0.1–0.9)
MONO%: 10.1 % (ref 0.0–14.0)
NEUT#: 3.2 10*3/uL (ref 1.5–6.5)
NEUT%: 62.8 % (ref 38.4–76.8)
Platelets: 255 10*3/uL (ref 145–400)
RBC: 4.03 10*6/uL (ref 3.70–5.45)
RDW: 14.4 % (ref 11.2–14.5)
WBC: 5.1 10*3/uL (ref 3.9–10.3)
lymph#: 1.3 10*3/uL (ref 0.9–3.3)

## 2016-05-23 MED ORDER — SODIUM CHLORIDE 0.9 % IJ SOLN
10.0000 mL | INTRAMUSCULAR | Status: DC | PRN
  Administered 2016-05-23: 10 mL
  Filled 2016-05-23: qty 10

## 2016-05-23 MED ORDER — ACETAMINOPHEN 325 MG PO TABS
650.0000 mg | ORAL_TABLET | Freq: Once | ORAL | Status: DC
Start: 2016-05-23 — End: 2016-05-23

## 2016-05-23 MED ORDER — TRASTUZUMAB CHEMO 150 MG IV SOLR
6.0000 mg/kg | Freq: Once | INTRAVENOUS | Status: AC
  Administered 2016-05-23: 420 mg via INTRAVENOUS
  Filled 2016-05-23: qty 7.14

## 2016-05-23 MED ORDER — SODIUM CHLORIDE 0.9 % IV SOLN
Freq: Once | INTRAVENOUS | Status: AC
  Administered 2016-05-23: 12:00:00 via INTRAVENOUS

## 2016-05-23 MED ORDER — DIPHENHYDRAMINE HCL 25 MG PO CAPS
ORAL_CAPSULE | ORAL | Status: AC
  Filled 2016-05-23: qty 1

## 2016-05-23 MED ORDER — HEPARIN SOD (PORK) LOCK FLUSH 100 UNIT/ML IV SOLN
500.0000 [IU] | Freq: Once | INTRAVENOUS | Status: AC | PRN
  Administered 2016-05-23: 500 [IU]
  Filled 2016-05-23: qty 5

## 2016-05-23 MED ORDER — ACETAMINOPHEN 325 MG PO TABS
ORAL_TABLET | ORAL | Status: AC
  Filled 2016-05-23: qty 2

## 2016-05-23 MED ORDER — DIPHENHYDRAMINE HCL 25 MG PO CAPS: 25.0000 mg | ORAL_CAPSULE | Freq: Once | ORAL | Status: DC

## 2016-05-23 NOTE — Patient Instructions (Signed)
[facility] Discharge Instructions for Patients  Today you received the following: Herceptin   To help prevent nausea and vomiting after your treatment, we encourage you to take your nausea medication as directed.   If you develop nausea and vomiting that is not controlled by your nausea medication, call the clinic.   BELOW ARE SYMPTOMS THAT SHOULD BE REPORTED IMMEDIATELY:  *FEVER GREATER THAN 100.5 F  *CHILLS WITH OR WITHOUT FEVER  NAUSEA AND VOMITING THAT IS NOT CONTROLLED WITH YOUR NAUSEA MEDICATION  *UNUSUAL SHORTNESS OF BREATH  *UNUSUAL BRUISING OR BLEEDING  TENDERNESS IN MOUTH AND THROAT WITH OR WITHOUT PRESENCE OF ULCERS  *URINARY PROBLEMS  *BOWEL PROBLEMS  UNUSUAL RASH Items with * indicate a potential emergency and should be followed up as soon as possible.  Feel free to call the clinic you have any questions or concerns. The clinic phone number is [phone].  Please show the CHEMO ALERT CARD at check-in to the Emergency Department and triage nurse.   

## 2016-05-24 ENCOUNTER — Telehealth: Payer: Self-pay | Admitting: *Deleted

## 2016-05-24 DIAGNOSIS — C50411 Malignant neoplasm of upper-outer quadrant of right female breast: Secondary | ICD-10-CM

## 2016-05-24 NOTE — Telephone Encounter (Signed)
  Oncology Nurse Navigator Documentation  Navigator Location: [facility]-Med Onc (05/24/16 1600) Navigator Encounter Type: Telephone (05/24/16 1600) Telephone: [facility] (05/24/16 1600)         Patient Visit Type: MedOnc (05/24/16 1600) Treatment Phase: Treatment (05/24/16 1600)     Interventions: Referrals (05/24/16 1600) Referrals: Survivorship (05/24/16 1600)                    Time Spent with Patient: 15 (05/24/16 1600)

## 2016-05-27 ENCOUNTER — Ambulatory Visit
Admission: RE | Admit: 2016-05-27 | Discharge: 2016-05-27 | Disposition: A | Discharge status: Home / Self Care | Payer: BLUE CROSS/BLUE SHIELD | Source: Ambulatory Visit | Attending: Neurological Surgery | Admitting: Neurological Surgery

## 2016-05-27 DIAGNOSIS — G930 Cerebral cysts: Secondary | ICD-10-CM

## 2016-06-14 ENCOUNTER — Other Ambulatory Visit: Payer: Self-pay | Admitting: *Deleted

## 2016-06-14 MED ORDER — GABAPENTIN 300 MG PO CAPS: ORAL_CAPSULE | ORAL | 3 refills | Status: DC

## 2016-06-16 ENCOUNTER — Other Ambulatory Visit: Payer: Self-pay | Admitting: Oncology

## 2016-06-16 DIAGNOSIS — Z87891 Personal history of nicotine dependence: Secondary | ICD-10-CM | POA: Diagnosis not present

## 2016-06-16 DIAGNOSIS — C50411 Malignant neoplasm of upper-outer quadrant of right female breast: Secondary | ICD-10-CM | POA: Diagnosis not present

## 2016-06-16 DIAGNOSIS — K219 Gastro-esophageal reflux disease without esophagitis: Secondary | ICD-10-CM | POA: Diagnosis not present

## 2016-06-16 DIAGNOSIS — G930 Cerebral cysts: Secondary | ICD-10-CM | POA: Diagnosis not present

## 2016-06-16 DIAGNOSIS — Z452 Encounter for adjustment and management of vascular access device: Secondary | ICD-10-CM | POA: Diagnosis not present

## 2016-06-20 ENCOUNTER — Ambulatory Visit (HOSPITAL_BASED_OUTPATIENT_CLINIC_OR_DEPARTMENT_OTHER)
Admission: RE | Admit: 2016-06-20 | Discharge: 2016-06-20 | Disposition: A | Discharge status: Home / Self Care | Payer: BLUE CROSS/BLUE SHIELD | Source: Ambulatory Visit | Attending: Cardiology | Admitting: Cardiology

## 2016-06-20 ENCOUNTER — Ambulatory Visit (HOSPITAL_COMMUNITY)
Admission: RE | Admit: 2016-06-20 | Discharge: 2016-06-20 | Disposition: A | Discharge status: Home / Self Care | Payer: BLUE CROSS/BLUE SHIELD | Source: Ambulatory Visit | Attending: Internal Medicine | Admitting: Internal Medicine

## 2016-06-20 VITALS — BP 130/82 | HR 86 | Wt 158.8 lb

## 2016-06-20 DIAGNOSIS — C50411 Malignant neoplasm of upper-outer quadrant of right female breast: Secondary | ICD-10-CM | POA: Insufficient documentation

## 2016-06-20 DIAGNOSIS — Z9221 Personal history of antineoplastic chemotherapy: Secondary | ICD-10-CM | POA: Insufficient documentation

## 2016-06-20 DIAGNOSIS — T451X5A Adverse effect of antineoplastic and immunosuppressive drugs, initial encounter: Secondary | ICD-10-CM

## 2016-06-20 DIAGNOSIS — I427 Cardiomyopathy due to drug and external agent: Secondary | ICD-10-CM

## 2016-06-20 DIAGNOSIS — R072 Precordial pain: Secondary | ICD-10-CM | POA: Diagnosis not present

## 2016-06-20 NOTE — Progress Notes (Signed)
  Echocardiogram 2D Echocardiogram has been performed.  Tresa Res 06/20/2016, 2:45 PM

## 2016-06-21 NOTE — Progress Notes (Signed)
Patient ID: Bonnie Ward, female   DOB: [birth year], 55 y.o.   MRN: [id number] Oncologist: Dr. Jana Hakim  55 yo with triple receptor positive breast cancer returns for cardio-oncology evaluation.  Cancer was diagnosed in 5/16.  She is s/p right partial mastectomy.  Cancer was ER+/PR+/HER2+.  She had chemotherapy with doxorubicin and cyclophosphamide completed 04/16/2015, followed by paclitaxel weekly starting 04/30/2015.  Paclitaxel was stopped due to peripheral neuropathy.  Herceptin held after 12/16 echo then restarted.  She has now completed Herceptin.   She had atypical chest pain in 7/17.  ETT-Cardiolite showed a fixed septal defect, likely artifact (low risk).  No further chest pain.  No exertional dyspnea. She is exercising 5 days/week.   Labs (12/16): K 4.1, creatinine 0.8   PMH: 1. GERD 2. IBS 3. Recurrent UTIs 4. Breast cancer: ER+/PR+/HER2+.  Diagnosed 5/16, had partial right mastectomy in 6/16.  - Echo (6/16) with EF 55-60%, normal RV, lateral s' 14 cm/sec, GLS -23.3%.  - Doxorubicin/cyclophosphamide until 8/16.  - Paclitaxel until 10/16.  - Herceptin ongoing.  - Echo (12/16) with EF 50%, mild LV dilation, GLS -19.9%.  - Echo (2/17) with EF 55%, lateral s' 9.6, GLS -42.8%, normal diastolic function, normal RV size and systolic function.  - Echo (3/17) with EF 55%, lateral s' 11.9, GLS -76.8%, normal diastolic function, normal RV size and systolic function.  - Echo (7/17): I reviewed, think EF unchanged at 55%, GLS -19.3%.  - Echo (10/17): EF 11%, normal diastolic function, GLS -57.2%, normal RV size and systolic function.  5. Neuropathy from paclitaxel. 6. Chest pain: ETT-Cardiolite (7/17) with EF 54%, fixed septal defect, likely artifact.    SH: Quit smoking in 2016, works as Haematologist, rare ETOH.   FH: Uncle with MI in his 57s. Grandfather with MI in his 41s.   ROS: All systems reviewed and negative except as per HPI.   Current Outpatient Prescriptions  Medication Sig  Dispense Refill  . bimatoprost (LUMIGAN) 0.03 % ophthalmic solution Place 1 drop into both eyes at bedtime.    . gabapentin (NEURONTIN) 300 MG capsule TAKE 1 CAPSULE(300 MG) BY MOUTH AT BEDTIME 30 capsule 3  . liothyronine (CYTOMEL) 5 MCG tablet Take 5 mcg by mouth 2 (two) times daily.    Marland Kitchen omeprazole (PRILOSEC) 40 MG capsule Take 1 capsule (40 mg total) by mouth daily. 30 capsule 2  . tamoxifen (NOLVADEX) 20 MG tablet Take 20 mg by mouth daily.    Marland Kitchen lidocaine-prilocaine (EMLA) cream Reported on 09/23/2015  3   No current facility-administered medications for this encounter.    BP 130/82 (BP Location: Right Arm, Patient Position: Sitting, Cuff Size: Normal)   Pulse 86   Wt 158 lb 12.8 oz (72 kg)   LMP 01/25/2011   SpO2 99%   BMI 25.63 kg/m  General: NAD Neck: No JVD, no thyromegaly or thyroid nodule.  Lungs: Clear to auscultation bilaterally with normal respiratory effort. CV: Nondisplaced PMI.  Heart regular S1/S2, no S3/S4, no murmur.  No peripheral edema.  No carotid bruit.  Normal pedal pulses.  Abdomen: Soft, nontender, no hepatosplenomegaly, no distention.  Skin: Intact without lesions or rashes. Port right upper chest.  Neurologic: Alert and oriented x 3.  Psych: Normal affect. Extremities: No clubbing or cyanosis.  HEENT: Normal.   Assessment/Plan: 55 yo with triple positive breast cancer s/p partial mastectomy.   1. Chemotherapy cardiotoxicity: She had doxorubicin and cyclophosphamide until 8/16 and then Herceptin until 9/17.  Baseline echo prior to treatment was  normal. Herceptin was held after 12/16 echo with mild fall in EF but now back on Herceptin.  I reviewed today's echo which showed stable EF and strain.  She has completed Herceptin.   - Echo is stable today, no further echoes required as she is finished with Herceptin.  2. Chest pain: Low risk Cardiolite in 7/17.  No further chest pain.  Loralie Champagne 06/21/2016

## 2016-06-26 NOTE — Progress Notes (Signed)
[facility]  [phone]) [phone] Fax:(336) [phone]    ID: Bonnie Ward DOB: 10-03-1960  MR#: [id number]  XEN#:[id number]  Patient Care Team: Lollie Sails, MD as PCP - General (Internal Medicine) Milford Cage, [place] (Inactive) Kem Boroughs, FNP as Nurse Practitioner (Nurse Practitioner) Fanny Skates, MD as Consulting Physician (General Surgery) Chauncey Cruel, MD as Consulting Physician (Oncology) Thea Silversmith, MD as Consulting Physician (Radiation Oncology) Rockwell Germany, RN as Registered Nurse Mauro Kaufmann, RN as Registered Nurse Holley Bouche, NP as Nurse Practitioner (Nurse Practitioner) Kristeen Miss, MD as Consulting Physician (Neurosurgery) PCP: Lollie Sails, MD OTHER MD:  CHIEF COMPLAINT: Estrogen receptor positive breast cancer  CURRENT TREATMENT:tamoxifen  BREAST CANCER HISTORY: From the initial intake note:   Bonnie Ward had routine bilateral screening mammography at the [facility] for 20 01/09/2015 showing a possible mass in the right breast. On 12/22/2014 she underwent diagnostic right mammography with tomosynthesis and right breast ultrasonography. The breast density was category C. In the right breast there was an area of increased density with architectural distortion and faint microcalcifications in the upper outer quadrant. There was mild palpable soft tissue thickening in the area in question, but no palpable right axillary lymph nodes. Ultrasound confirmed a 1.7 cm irregular hypoechoic mass. There were no abnormal-appearing right axillary lymph nodes.  Biopsy of the right breast mass in question 12/26/2014 showed ([facility] 16-08/08/2005) an invasive ductal carcinoma, grade 1 or 2, with a prognostic panel still pending.  Her subsequent history is as detailed below  INTERVAL HISTORY: Bonnie Ward returns today for follow-up of her triple positive breast cancer. Since her last visit here she completed her Herceptin treatments.  She had an echocardiogram after the treatments which shows a well-preserved ejection fraction. She had her port removed.  She continues on tamoxifen. She does not like it. The biggest problem is the vaginal discharge. There is also some vaginal dryness of course. Hot flashes are "not bad". She obtains a drug at a good price.  REVIEW OF SYSTEMS: She has dry eyes, which she did not have prior to chemotherapy. She still has numbness in the first 3 toes of her right foot and occasionally she has shooting pains in that foot. She is doing yoga, Pilates, strength training, and spin at least 5 days a week. A detailed review of systems today was otherwise stable   PAST MEDICAL HISTORY: Past Medical History:  Diagnosis Date  . ACID REFLUX DISEASE 07/12/2010  . Anxiety   . Arachnoid cyst    left frontal lobe  . Arthritis    hands  . Breast cancer ([facility]) 12/2014   right  . Complication of anesthesia 2012   states woke up during colonoscopy with Propofol  . Dental crowns present   . Difficulty swallowing pills   . Glaucoma   . Hypothyroidism   . Migraines   . Nervous stomach   . Seasonal allergies   . Stress fracture of foot 12/2014   right    PAST SURGICAL HISTORY: Past Surgical History:  Procedure Laterality Date  . COLONOSCOPY WITH PROPOFOL  10/14/2013  . ESOPHAGOGASTRODUODENOSCOPY ENDOSCOPY    . LASIK    . PORTACATH PLACEMENT Left 01/23/2015   Procedure: INSERTION PORT-A-CATH ;  Surgeon: Fanny Skates, MD;  Location: [facility];  Service: General;  Laterality: Left;  . RADIOACTIVE SEED GUIDED MASTECTOMY WITH AXILLARY SENTINEL LYMPH NODE BIOPSY Right 01/23/2015   Procedure: RIGHT PARTIAL MASTECTOMY WITH RADIOACTIVE SEED LOCALIZATION  WITH RIGHT AXILLARY SENTINEL LYMPH  NODE BIOPSY;  Surgeon: Fanny Skates, MD;  Location: [facility];  Service: General;  Laterality: Right;  . RE-EXCISION OF BREAST LUMPECTOMY Right 02/16/2015   Procedure: RE-EXCISION OF RIGHT   BREAST LUMPECTOMY MARGINS;  Surgeon: Fanny Skates, MD;  Location: [facility];  Service: General;  Laterality: Right;  . TONSILLECTOMY  age 84  . TOOTH EXTRACTION      FAMILY HISTORY Family History  Problem Relation Age of Onset  . Osteoporosis Mother   . Irritable bowel syndrome Mother   . Hypertension Mother   . Hypertension Father   . Hyperlipidemia Father   . Diabetes Father   . Dementia Father   . Colon cancer Maternal Grandfather 67  . Breast cancer Paternal Aunt   . Breast cancer Paternal Grandmother    the patient's father died at age 82 from pneumonia. [name] died at age 75 after bowel perforation. The patient had one brother, one sister. There is no history of breast or ovarian cancer in the family.  GYNECOLOGIC HISTORY:  Patient's last menstrual period was 01/25/2011. Menarche age 33, the patient is GX P0. She stopped having periods approximately 2011. She is still on hormone replacement, but is "trickling off it". She did take birth control pills for approximately 3 years remotely with no complications  SOCIAL HISTORY:  Bonnie Ward works as a Haematologist. She is divorced and lives by herself, with 2 dogs.    ADVANCED DIRECTIVES: The patient's brother [name] is her healthcare power of attorney. He may be reached in [place] at [place]: Social History  Substance Use Topics  . Smoking status: Former Smoker    Packs/day: 0.00    Years: 0.00    Quit date: 02/19/2005  . Smokeless tobacco: Never Used  . Alcohol use Yes     Comment: occasionally     Colonoscopy: February 2016  PAP: June 2015  Bone density: 12/02/2013-- T - 2.2  Lipid panel:  Allergies  Allergen Reactions  . Lortab [Hydrocodone-Acetaminophen] Anxiety  . Milk-Related Compounds Diarrhea    GI UPSET  . Coreg [Carvedilol] Other (See Comments)    Fatigue     Current Outpatient Prescriptions  Medication Sig Dispense Refill  . bimatoprost  (LUMIGAN) 0.03 % ophthalmic solution Place 1 drop into both eyes at bedtime.    . gabapentin (NEURONTIN) 300 MG capsule TAKE 1 CAPSULE(300 MG) BY MOUTH AT BEDTIME 30 capsule 3  . liothyronine (CYTOMEL) 5 MCG tablet Take 5 mcg by mouth 2 (two) times daily.    Marland Kitchen omeprazole (PRILOSEC) 40 MG capsule Take 1 capsule (40 mg total) by mouth daily. 30 capsule 2  . tamoxifen (NOLVADEX) 20 MG tablet Take 20 mg by mouth daily.     No current facility-administered medications for this visit.     OBJECTIVE: Middle-aged white womanWho appears well Vitals:   06/27/16 0936  BP: 131/72  Pulse: 76  Resp: 18  Temp: 98.1 F (36.7 C)     Body mass index is 25.32 kg/m.    ECOG FS:0 - Asymptomatic  Sclerae unicteric, EOMs intact Oropharynx clear and moist No cervical or supraclavicular adenopathy Lungs no rales or rhonchi Heart regular rate and rhythm Abd soft, nontender, positive bowel sounds MSK no focal spinal tenderness, no upper extremity lymphedema Neuro: nonfocal, well oriented, appropriate affect Breasts: The right breast is status post lumpectomy and radiation. There is no evidence of local recurrence. The right axilla is benign. Left breast is unremarkable.  LAB RESULTS:  CMP     Component Value Date/Time   NA 141 05/23/2016 1135   K 4.1 05/23/2016 1135   CL 108 01/21/2009 0828   CO2 28 05/23/2016 1135   GLUCOSE 92 05/23/2016 1135   BUN 11.3 05/23/2016 1135   CREATININE 0.8 05/23/2016 1135   CALCIUM 9.1 05/23/2016 1135   PROT 6.8 05/23/2016 1135   ALBUMIN 3.5 05/23/2016 1135   AST 19 05/23/2016 1135   ALT 17 05/23/2016 1135   ALKPHOS 77 05/23/2016 1135   BILITOT 0.31 05/23/2016 1135   GFRNONAA 95.01 01/21/2009 0828    INo results found for: SPEP, UPEP  Lab Results  Component Value Date   WBC 4.6 06/27/2016   NEUTROABS 2.7 06/27/2016   HGB 12.4 06/27/2016   HCT 36.9 06/27/2016   MCV 94.5 06/27/2016   PLT 265 06/27/2016      Chemistry      Component Value  Date/Time   NA 141 05/23/2016 1135   K 4.1 05/23/2016 1135   CL 108 01/21/2009 0828   CO2 28 05/23/2016 1135   BUN 11.3 05/23/2016 1135   CREATININE 0.8 05/23/2016 1135      Component Value Date/Time   CALCIUM 9.1 05/23/2016 1135   ALKPHOS 77 05/23/2016 1135   AST 19 05/23/2016 1135   ALT 17 05/23/2016 1135   BILITOT 0.31 05/23/2016 1135       No results found for: LABCA2  No components found for: LABCA125  No results for input(s): INR in the last 168 hours.  Urinalysis    Component Value Date/Time   COLORURINE yellow 10/05/2009 1454   APPEARANCEUR Clear 10/05/2009 1454   LABSPEC 1.010 10/05/2009 1454   PHURINE 7.0 10/05/2009 1454   HGBUR 2+ 10/05/2009 1454   BILIRUBINUR neg 04/18/2016 1433   PROTEINUR neg 04/18/2016 1433   UROBILINOGEN negative 04/18/2016 1433   UROBILINOGEN 0.2 10/05/2009 1454   NITRITE neg 04/18/2016 1433   NITRITE negative 10/05/2009 1454   LEUKOCYTESUR Trace (A) 04/18/2016 1433    STUDIES: No results found.   ASSESSMENT: 55 y.o. [place] woman status post right breastUpper outer quadrant biopsy 12/26/2014 for a clinical T1 cN0, stage IA invasive ductal carcinoma, grade 1 or 2, estrogen and progesterone receptor positive, with an MIB-1 of 11%, and HER-2 amplified, with a signals ratio of 2.07, number per cell 2.28.  (1) status post right lumpectomy and sentinel lymph node sampling 01/23/2015 for a pT1c pN1, stage IIA invasive ductal carcinoma, grade 1, with focally positive anterior margins  (a) additional surgery 02/16/2015 found residual disease but cleared margins  (2) chemotherapy started 03/05/2015, consisting of doxorubicin and cyclophosphamide in dose dense fashion 4, completed 04/16/2015, followed by paclitaxel weekly x 12 planned cycles, started 04/30/2015,  with trastuzumab and pertuzumab given every 3 weeks.  (a) Paclitaxel stopped after 4 cycles because of progressive neuropathy, last dose 05/28/2015  (b) pertuzumab stopped  after 2 cycles (when paclitaxel stopped)  (3) trastuzumab was continued to complete 1 year-- final dose 05/23/2016  (a) echocardiogram 08/19/2016 shows a >10% drop-- trastuzumab held after 07/30/2015 dose  (b) repeat echocardiogram 10/12/2015 shows EF recovery to 55%, trastuzumab resumed 10/22/2015  (c) echocardiogram 03/07/2016 shows a stable ejection fraction  (d) echocardiogram 06/20/2016 shows an ejection fraction of 55%   (4) adjuvant radiation 07/06/2015-08/21/2015::  Right breast / 45 Gray @ 1.8 Pearline Cables per fraction x 25 fractions Right supraclavicular fossa/PAB 45 Gy '@1' .8 Gy per fraction x 25 fractions Right breast boost / 16 Gray at  2 Gray per fraction x 8 fractions  (5) started tamoxifen 09/10/2015  PLAN: Bonnie Ward has completed her Herceptin treatments and had her port removed. She is now beginning to look further ahead to what she hopes will be a more normal life.  The biggest problem she is having with tamoxifen is the vaginal discharge. She worries that this can interfere with her exercise routine as and intimacy issues. On the other hand she is very much afraid of the osteopenia/osteoporosis problems related to the aromatase inhibitors.  We reviewed all this in detail today and I gave her a copy of this summary comparison between tamoxifen and anastrozole. I reassured her that we are able today control osteopenia problems using bisphosphonates or denosumab. A problem of force with anastrozole is vaginal dryness. She understands that she can use estrogens while on tamoxifen but not while on aromatase inhibitors.  We also reviewed the fact that weight gain is related to menopause and not to anti-estrogens. If she really would like to lose weight she will have to cut out the carbs.  After much discussion what she decided to do is to continue tamoxifen but at vaginal estrogens. She will use these 3 times a week. If she has not noted a significant improvement in 3 months or so she will  let me know and if she wishes to switch to anastrozole at that point we can make that change.  Otherwise I am delighted that she is doing so well and I strongly affirmed her terrific exercise program. She is going to return to see me in 6 months. She knows to call for any problems that may develop before that visit.   Chauncey Cruel, MD   06/27/2016 9:44 AM

## 2016-06-27 ENCOUNTER — Other Ambulatory Visit (HOSPITAL_BASED_OUTPATIENT_CLINIC_OR_DEPARTMENT_OTHER): Payer: BLUE CROSS/BLUE SHIELD

## 2016-06-27 ENCOUNTER — Ambulatory Visit (HOSPITAL_BASED_OUTPATIENT_CLINIC_OR_DEPARTMENT_OTHER): Payer: BLUE CROSS/BLUE SHIELD | Admitting: Oncology

## 2016-06-27 VITALS — BP 131/72 | HR 76 | Temp 98.1°F | Resp 18 | Ht 66.0 in | Wt 156.9 lb

## 2016-06-27 DIAGNOSIS — Z170 Estrogen receptor positive status [ER+]: Secondary | ICD-10-CM | POA: Diagnosis not present

## 2016-06-27 DIAGNOSIS — C50411 Malignant neoplasm of upper-outer quadrant of right female breast: Secondary | ICD-10-CM

## 2016-06-27 DIAGNOSIS — Z79810 Long term (current) use of selective estrogen receptor modulators (SERMs): Secondary | ICD-10-CM | POA: Diagnosis not present

## 2016-06-27 LAB — CBC WITH DIFFERENTIAL/PLATELET
BASO%: 0.4 % (ref 0.0–2.0)
BASOS ABS: 0 10*3/uL (ref 0.0–0.1)
EOS ABS: 0.1 10*3/uL (ref 0.0–0.5)
EOS%: 1.6 % (ref 0.0–7.0)
HCT: 36.9 % (ref 34.8–46.6)
HGB: 12.4 g/dL (ref 11.6–15.9)
LYMPH%: 27.3 % (ref 14.0–49.7)
MCH: 31.8 pg (ref 25.1–34.0)
MCHC: 33.7 g/dL (ref 31.5–36.0)
MCV: 94.5 fL (ref 79.5–101.0)
MONO#: 0.6 10*3/uL (ref 0.1–0.9)
MONO%: 12.7 % (ref 0.0–14.0)
NEUT%: 58 % (ref 38.4–76.8)
NEUTROS ABS: 2.7 10*3/uL (ref 1.5–6.5)
PLATELETS: 265 10*3/uL (ref 145–400)
RBC: 3.91 10*6/uL (ref 3.70–5.45)
RDW: 14.2 % (ref 11.2–14.5)
WBC: 4.6 10*3/uL (ref 3.9–10.3)
lymph#: 1.2 10*3/uL (ref 0.9–3.3)

## 2016-06-27 LAB — COMPREHENSIVE METABOLIC PANEL
ALT: 22 U/L (ref 0–55)
ANION GAP: 9 meq/L (ref 3–11)
AST: 26 U/L (ref 5–34)
Albumin: 3.4 g/dL — ABNORMAL LOW (ref 3.5–5.0)
Alkaline Phosphatase: 65 U/L (ref 40–150)
BILIRUBIN TOTAL: 0.47 mg/dL (ref 0.20–1.20)
BUN: 14 mg/dL (ref 7.0–26.0)
CO2: 25 meq/L (ref 22–29)
Calcium: 8.6 mg/dL (ref 8.4–10.4)
Chloride: 105 mEq/L (ref 98–109)
Creatinine: 0.8 mg/dL (ref 0.6–1.1)
EGFR: 83 mL/min/{1.73_m2} — AB (ref >90)
Glucose: 86 mg/dl (ref 70–140)
Potassium: 4.3 mEq/L (ref 3.5–5.1)
Sodium: 139 mEq/L (ref 136–145)
TOTAL PROTEIN: 6.7 g/dL (ref 6.4–8.3)

## 2016-06-27 MED ORDER — ESTRADIOL 0.1 MG/GM VA CREA: 1.0000 | TOPICAL_CREAM | Freq: Every day | VAGINAL | 12 refills | Status: DC

## 2016-07-05 ENCOUNTER — Telehealth: Payer: Self-pay | Admitting: *Deleted

## 2016-07-05 ENCOUNTER — Other Ambulatory Visit: Payer: Self-pay | Admitting: *Deleted

## 2016-07-05 DIAGNOSIS — K219 Gastro-esophageal reflux disease without esophagitis: Secondary | ICD-10-CM

## 2016-07-05 MED ORDER — OMEPRAZOLE 40 MG PO CPDR: 40.0000 mg | DELAYED_RELEASE_CAPSULE | Freq: Every day | ORAL | 2 refills | Status: DC

## 2016-07-05 MED ORDER — GABAPENTIN 300 MG PO CAPS: ORAL_CAPSULE | ORAL | 3 refills | Status: DC

## 2016-08-05 NOTE — Telephone Encounter (Signed)
NO ENTRY 

## 2016-08-08 ENCOUNTER — Encounter: Payer: BLUE CROSS/BLUE SHIELD | Admitting: Adult Health

## 2016-09-01 DIAGNOSIS — H524 Presbyopia: Secondary | ICD-10-CM | POA: Diagnosis not present

## 2016-09-01 DIAGNOSIS — H01001 Unspecified blepharitis right upper eyelid: Secondary | ICD-10-CM | POA: Diagnosis not present

## 2016-09-01 DIAGNOSIS — H01005 Unspecified blepharitis left lower eyelid: Secondary | ICD-10-CM | POA: Diagnosis not present

## 2016-09-01 DIAGNOSIS — H01002 Unspecified blepharitis right lower eyelid: Secondary | ICD-10-CM | POA: Diagnosis not present

## 2016-09-01 DIAGNOSIS — H01004 Unspecified blepharitis left upper eyelid: Secondary | ICD-10-CM | POA: Diagnosis not present

## 2016-10-10 DIAGNOSIS — Z86018 Personal history of other benign neoplasm: Secondary | ICD-10-CM | POA: Diagnosis not present

## 2016-10-10 DIAGNOSIS — L814 Other melanin hyperpigmentation: Secondary | ICD-10-CM | POA: Diagnosis not present

## 2016-10-10 DIAGNOSIS — D1801 Hemangioma of skin and subcutaneous tissue: Secondary | ICD-10-CM | POA: Diagnosis not present

## 2016-10-10 DIAGNOSIS — D225 Melanocytic nevi of trunk: Secondary | ICD-10-CM | POA: Diagnosis not present

## 2016-10-10 IMAGING — US US  BREAST BX W/ LOC DEV 1ST LESION IMG BX SPEC US GUIDE*R*
1 series · 8 of 8 positions shown · non-contrast
Comparison: Previous exam(s).

ADDENDUM:
Pathology revealed GRADE I TO II INVASIVE DUCTAL CARCINOMA in the
right breast. This was found to be concordant by Dr. Kael
Arlene. Pathology was discussed with the patient by telephone. She
reported doing well after the biopsy with tenderness at the site.
Post biopsy instructions and care were reviewed and her questions
were answered. She has been scheduled at [REDACTED]
[REDACTED] on January 07, 2015. My number was provided for
additional questions and concerns.

Pathology results reported by Clementina Chuang RN, BSN on December 29, 2014.
CLINICAL DATA: Screening detected suspicious 1.7 cm mass in the
upper outer quadrant of the right breast.
EXAM:
ULTRASOUND GUIDED RIGHT BREAST CORE NEEDLE BIOPSY

[Series 1: us breast bx w/ loc dev 1st lesion img bx spec us  · 0.07mm/px · 8 of 8 slices shown]
[im 1/8]
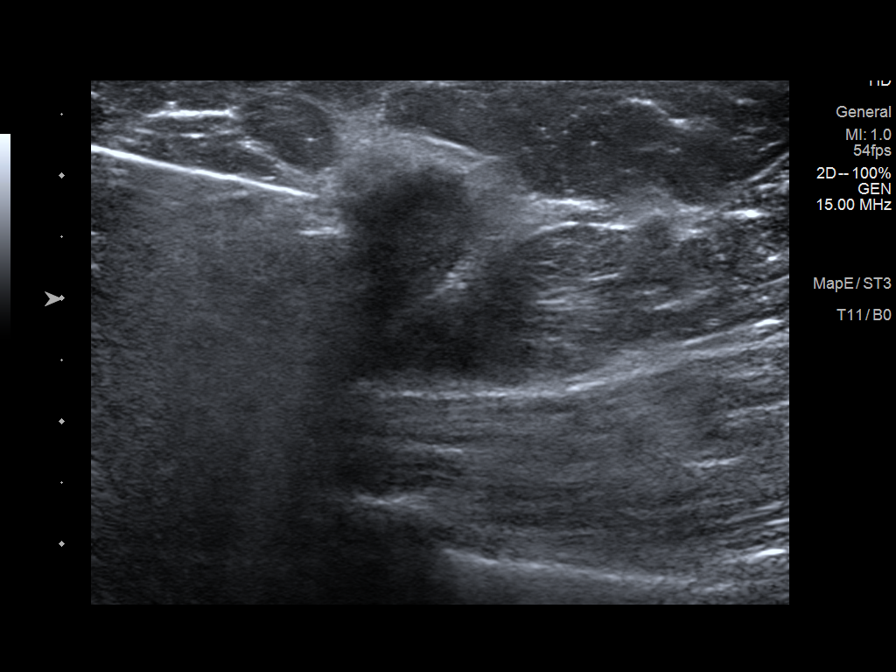
[im 2/8]
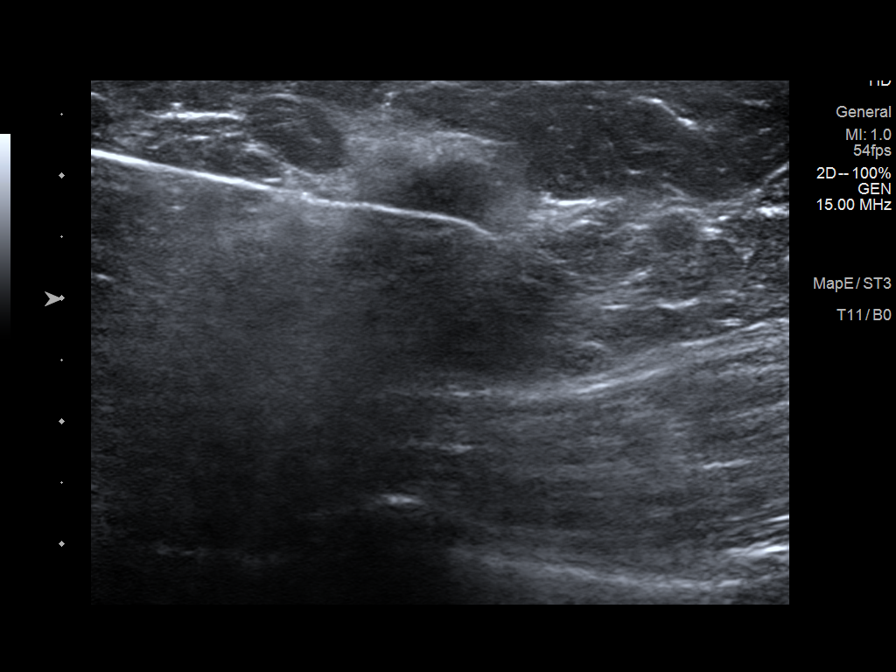
[im 3/8]
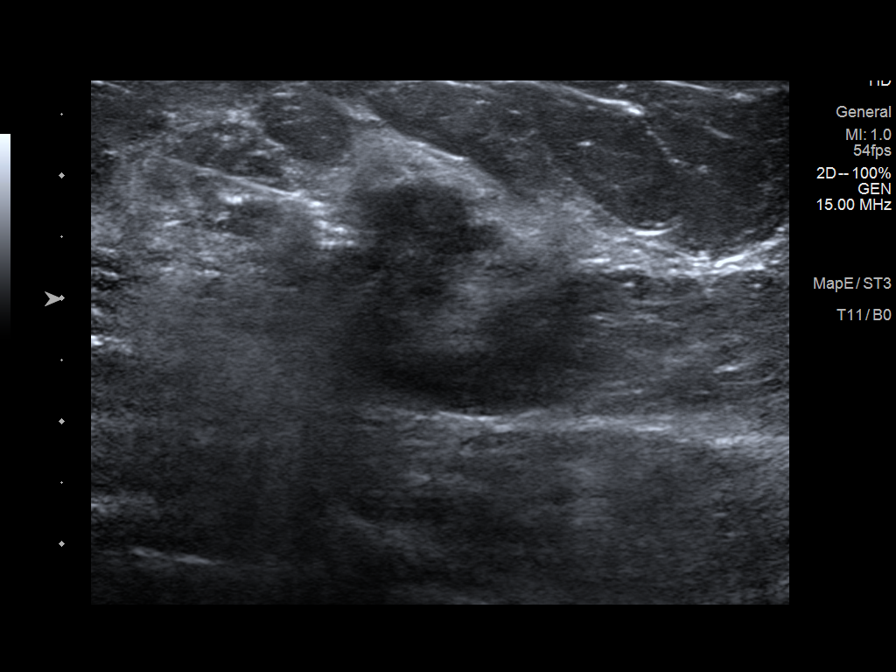
[im 4/8]
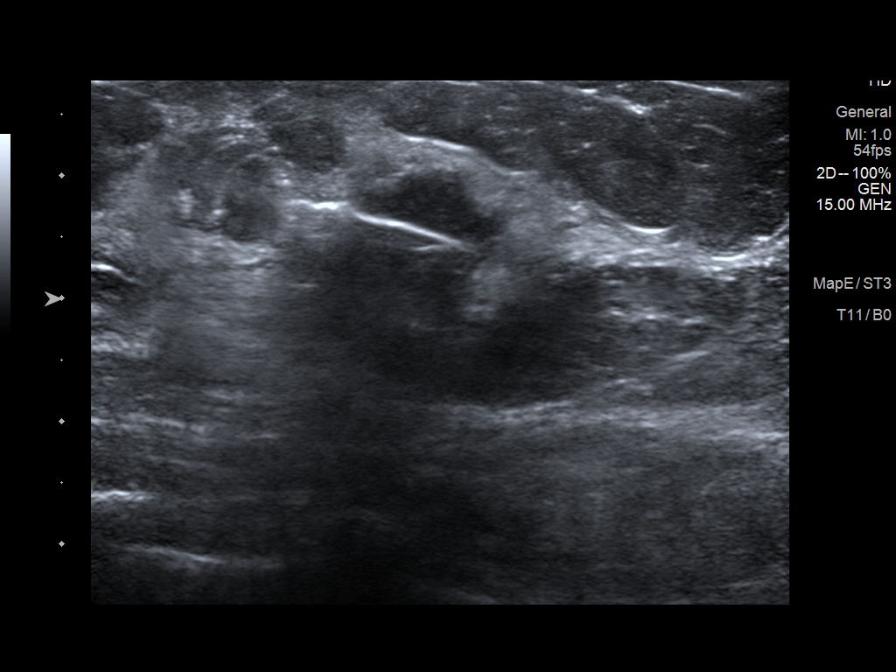
[im 5/8]
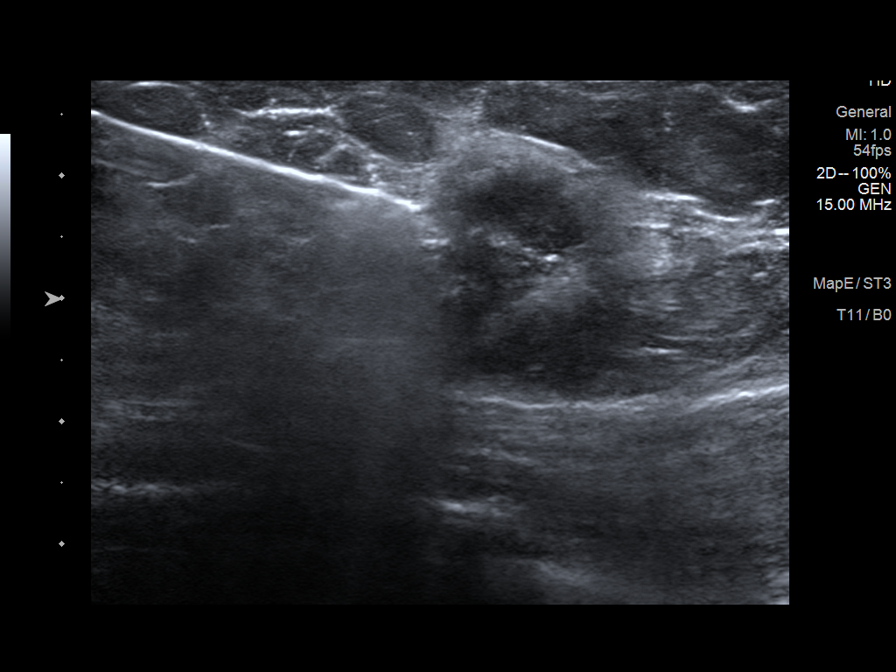
[im 6/8]
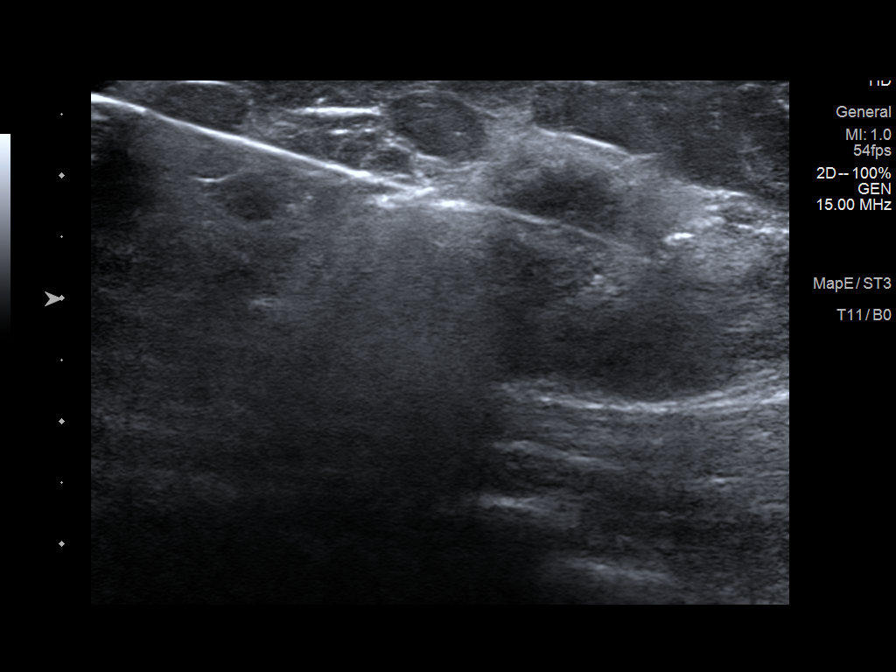
[im 7/8]
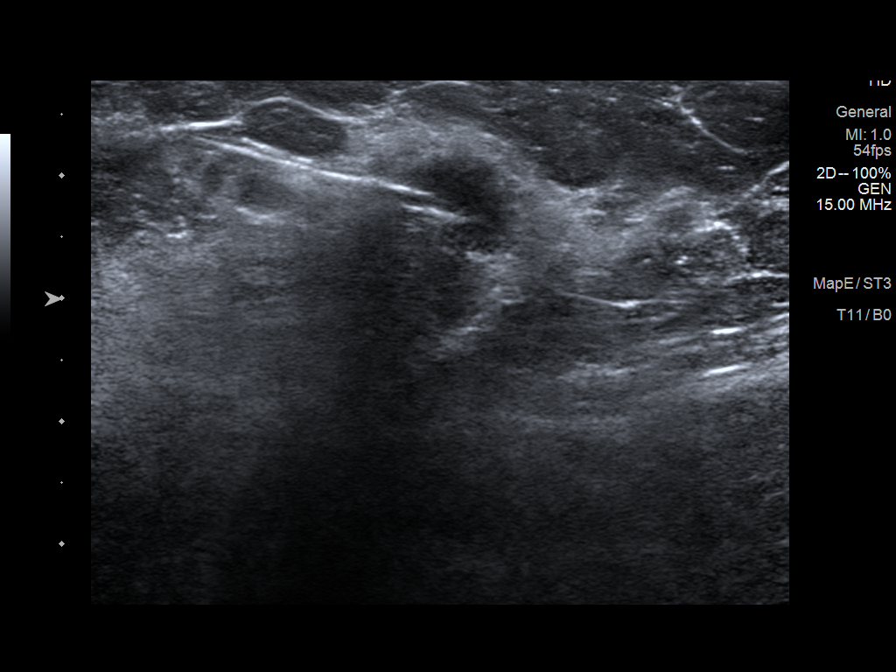
[im 8/8]
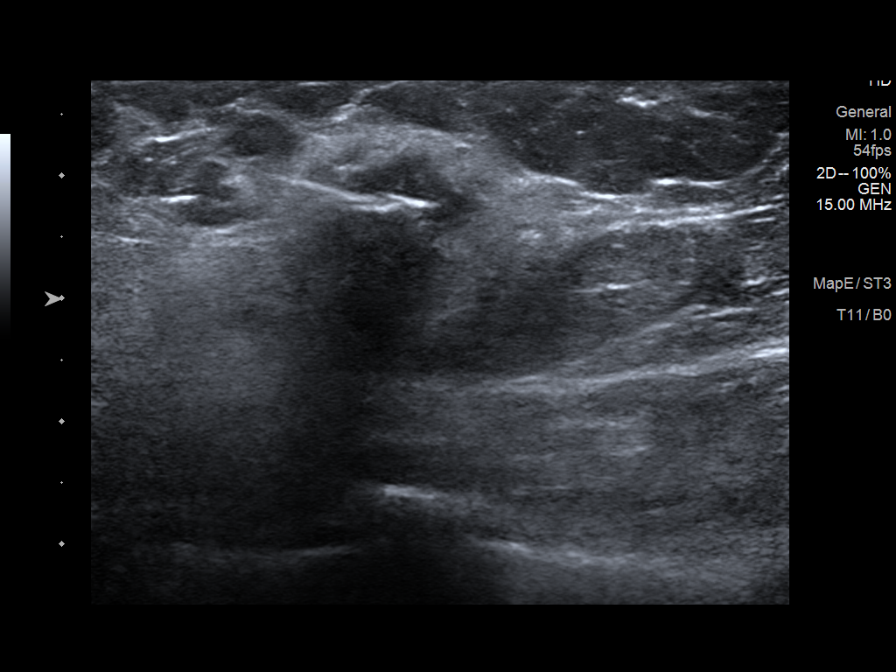

[8 of 8 positions shown; findings below may reference images not displayed]



Using sterile technique and 2% Lidocaine as local anesthetic, under
direct ultrasound visualization, a 14 gauge Ishikawa Hoshina core needle
device was placed through a 13 gauge introducer needle and was used
to perform biopsy of the suspicious 1.7 cm mass at the 10 o'clock
position of the right breast 6 cm from the nipple using a lateral
approach. At the conclusion of the procedure a ribbon shaped tissue
marker clip was deployed into the biopsy cavity. Follow up 2 view
mammogram was performed and dictated separately.
IMPRESSION: Ultrasound guided biopsy of a suspicious 1.7 cm mass in the upper
outer quadrant of the right breast. No apparent complications.

## 2016-10-19 ENCOUNTER — Other Ambulatory Visit: Payer: Self-pay | Admitting: Oncology

## 2016-10-21 ENCOUNTER — Other Ambulatory Visit: Payer: Self-pay | Admitting: Emergency Medicine

## 2016-10-21 MED ORDER — TAMOXIFEN CITRATE 20 MG PO TABS: 20.0000 mg | ORAL_TABLET | Freq: Every day | ORAL | 3 refills | Status: DC

## 2016-10-27 DIAGNOSIS — H01005 Unspecified blepharitis left lower eyelid: Secondary | ICD-10-CM | POA: Diagnosis not present

## 2016-10-27 DIAGNOSIS — H01002 Unspecified blepharitis right lower eyelid: Secondary | ICD-10-CM | POA: Diagnosis not present

## 2016-10-27 DIAGNOSIS — H01004 Unspecified blepharitis left upper eyelid: Secondary | ICD-10-CM | POA: Diagnosis not present

## 2016-10-27 DIAGNOSIS — H01001 Unspecified blepharitis right upper eyelid: Secondary | ICD-10-CM | POA: Diagnosis not present

## 2016-11-06 IMAGING — CR DG CHEST 2V
2 series · 2 of 2 positions shown · non-contrast
Comparison: None.

CLINICAL DATA: Newly diagnosed right breast carcinoma. Pre-op
respiratory exam

EXAM:
CHEST  2 VIEW

[w chest pa]
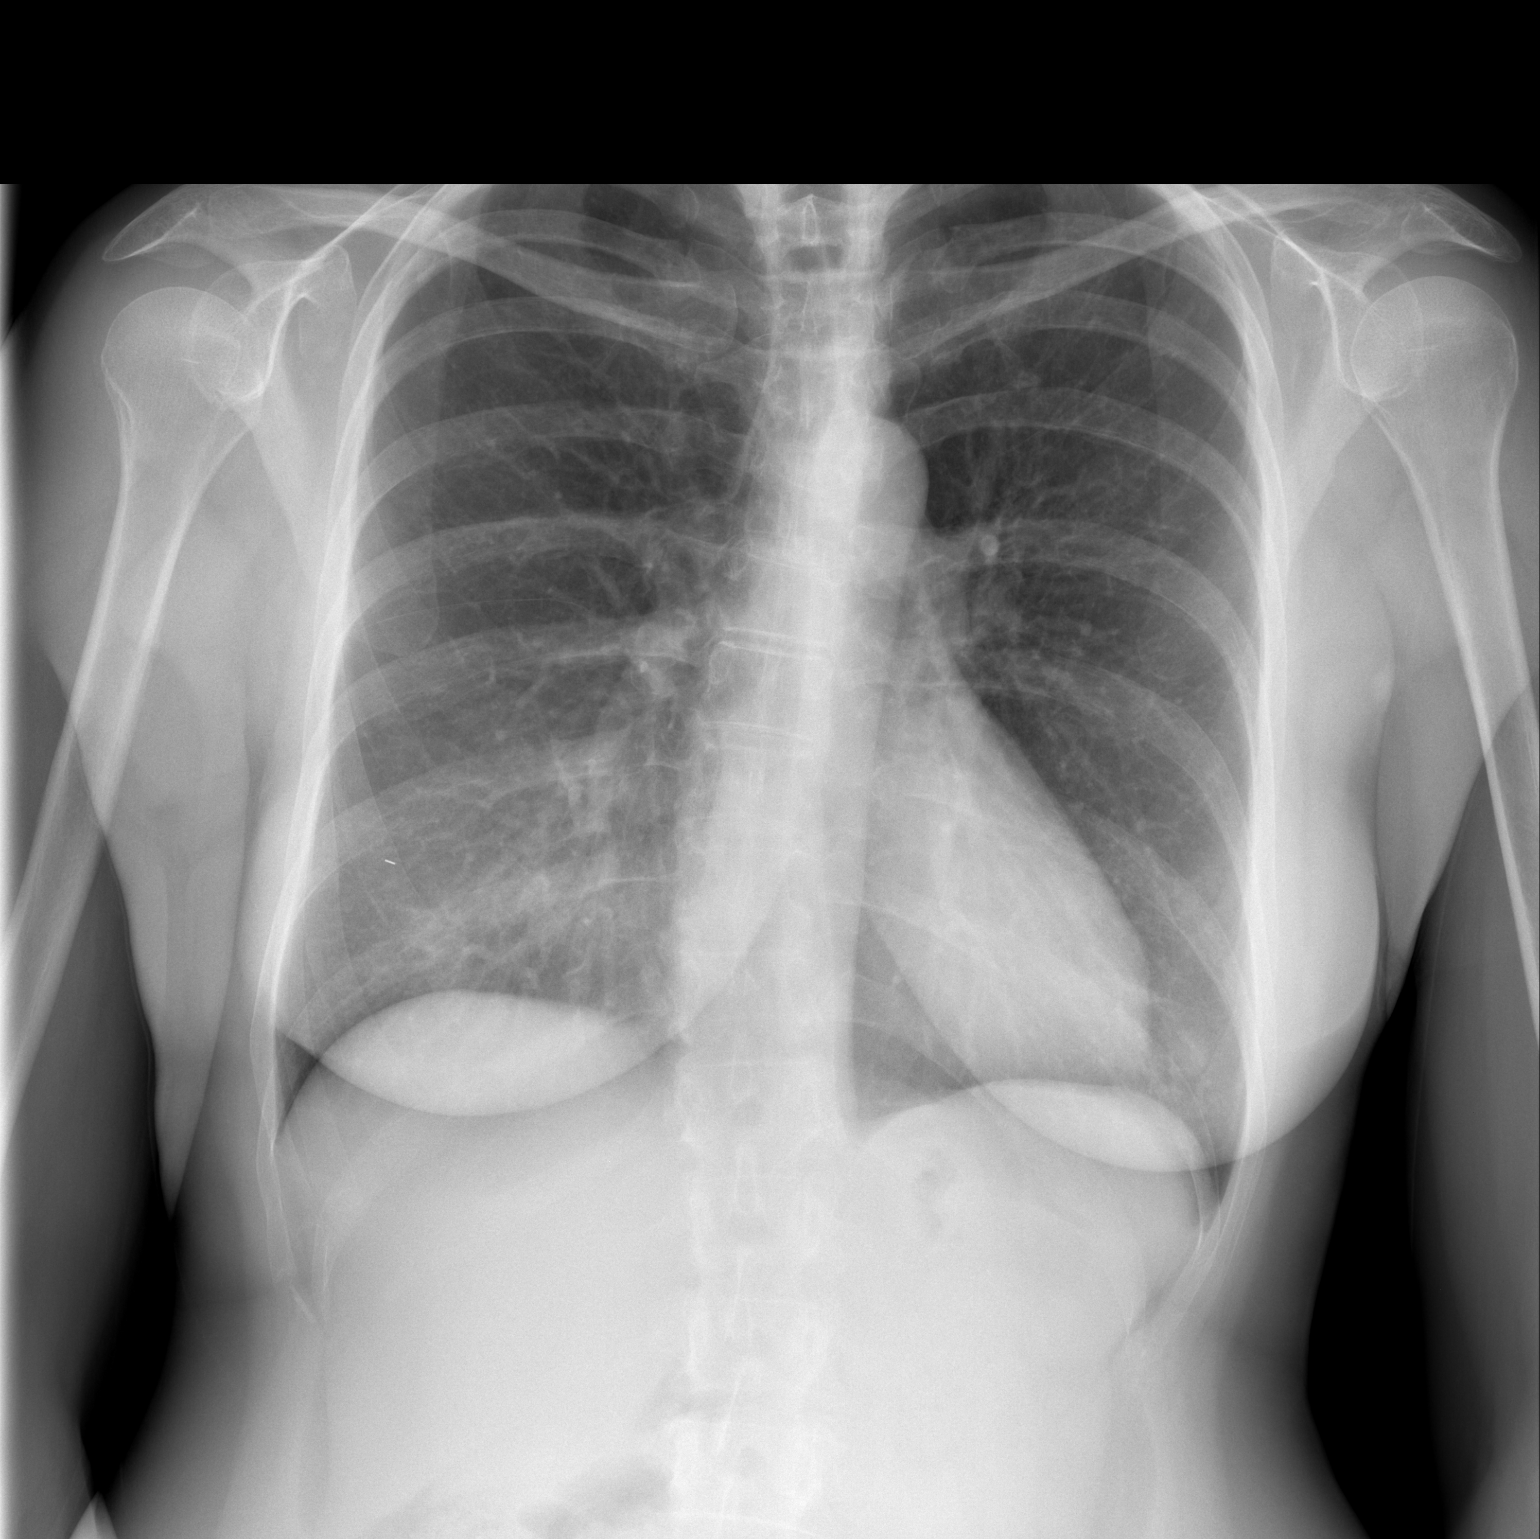

[w chest lat]
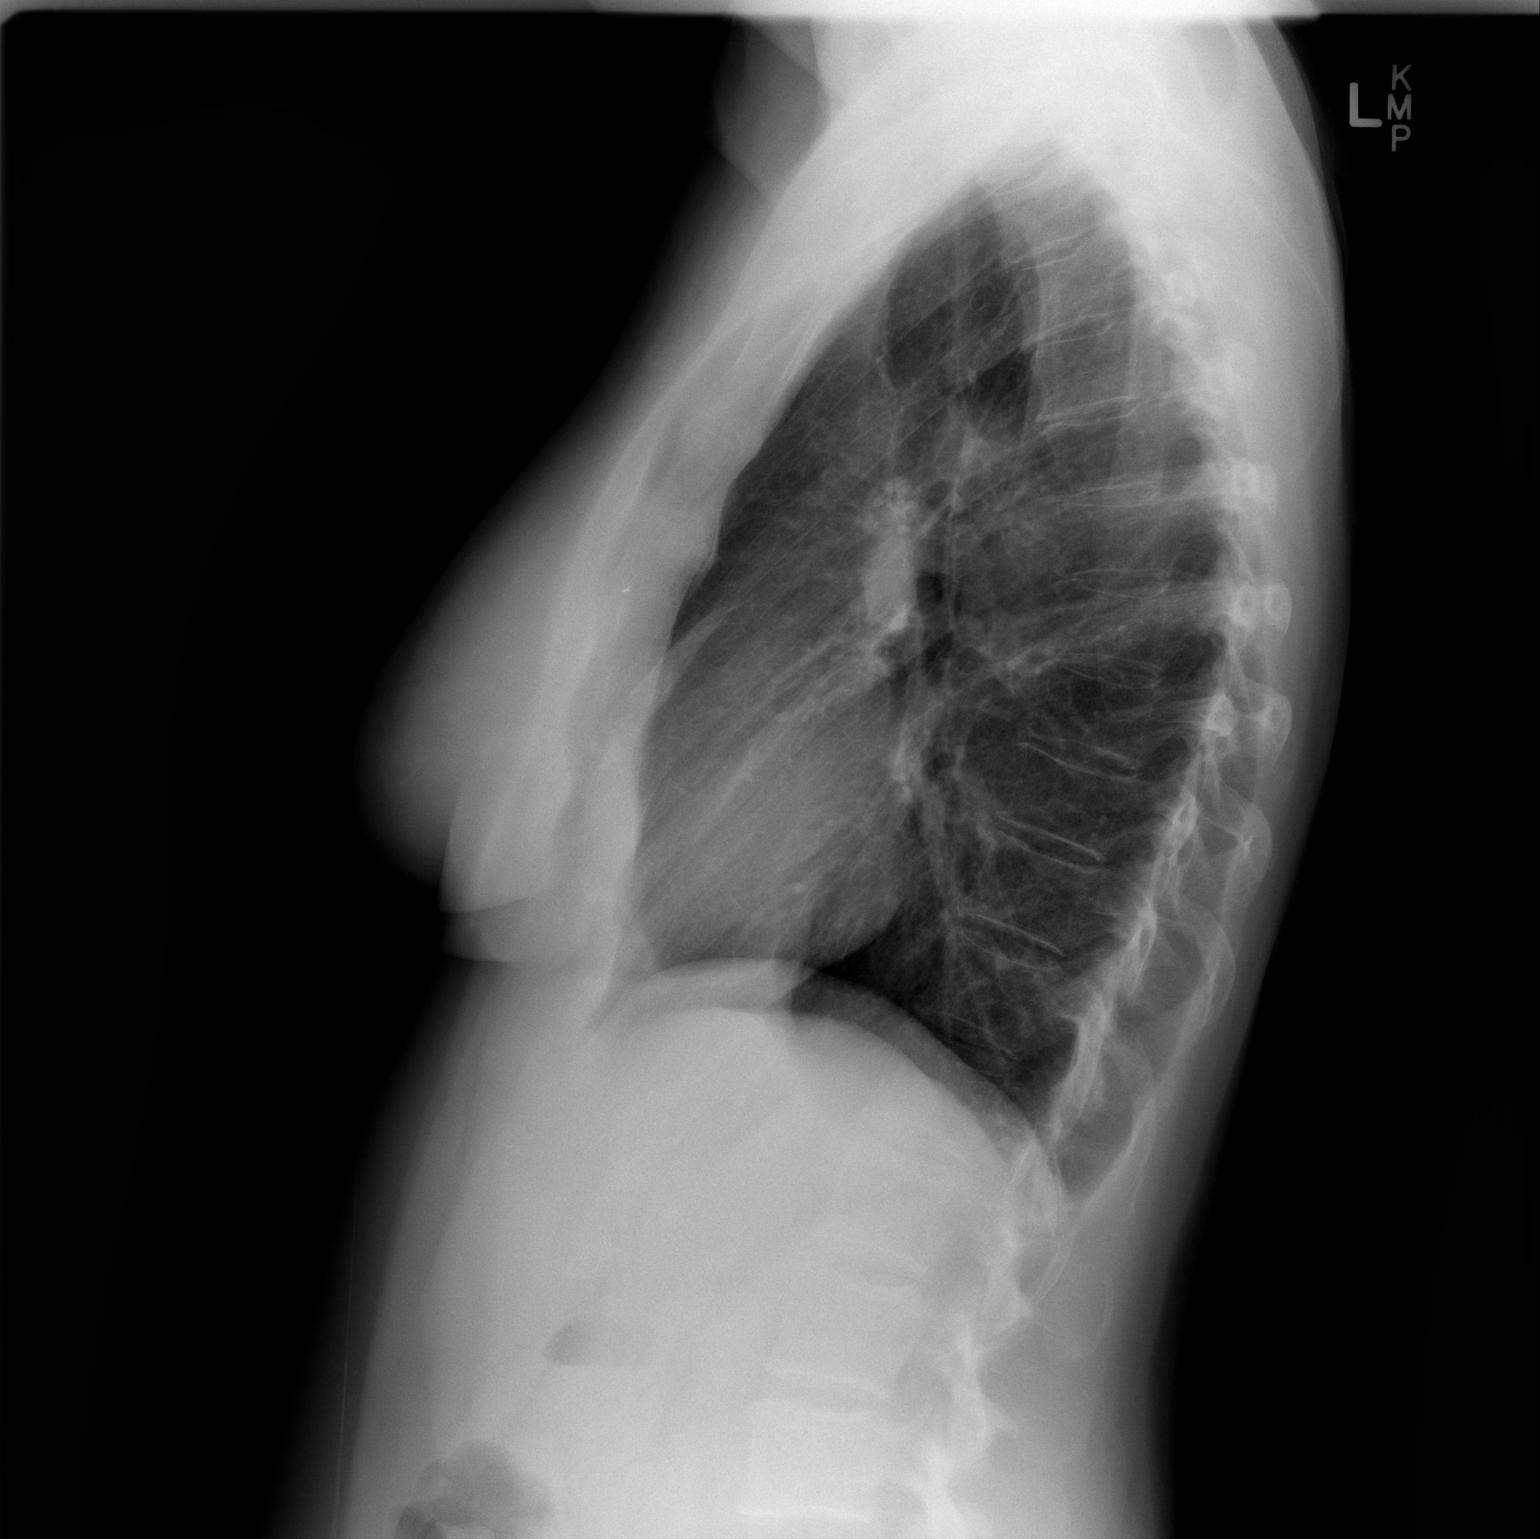

[2 of 2 positions shown; findings below may reference images not displayed]

FINDINGS: The heart size and mediastinal contours are within normal limits.
Both lungs are clear. No evidence of pleural effusion. No mass or
lymphadenopathy identified. Incidental note is made of nipple shadow
overlying the left lung base on the frontal projection. The
visualized skeletal structures are unremarkable except for mild
thoracic dextroscoliosis.
IMPRESSION: No active cardiopulmonary disease.

## 2016-11-07 IMAGING — CR DG CHEST 1V PORT
1 series · 1 of 1 positions shown · non-contrast
Comparison: 01/22/2015 chest radiographs

CLINICAL DATA: 53-year-old female status post porta cath placement.
Initial encounter. Breast cancer.

EXAM:
PORTABLE CHEST - 1 VIEW

[AP]
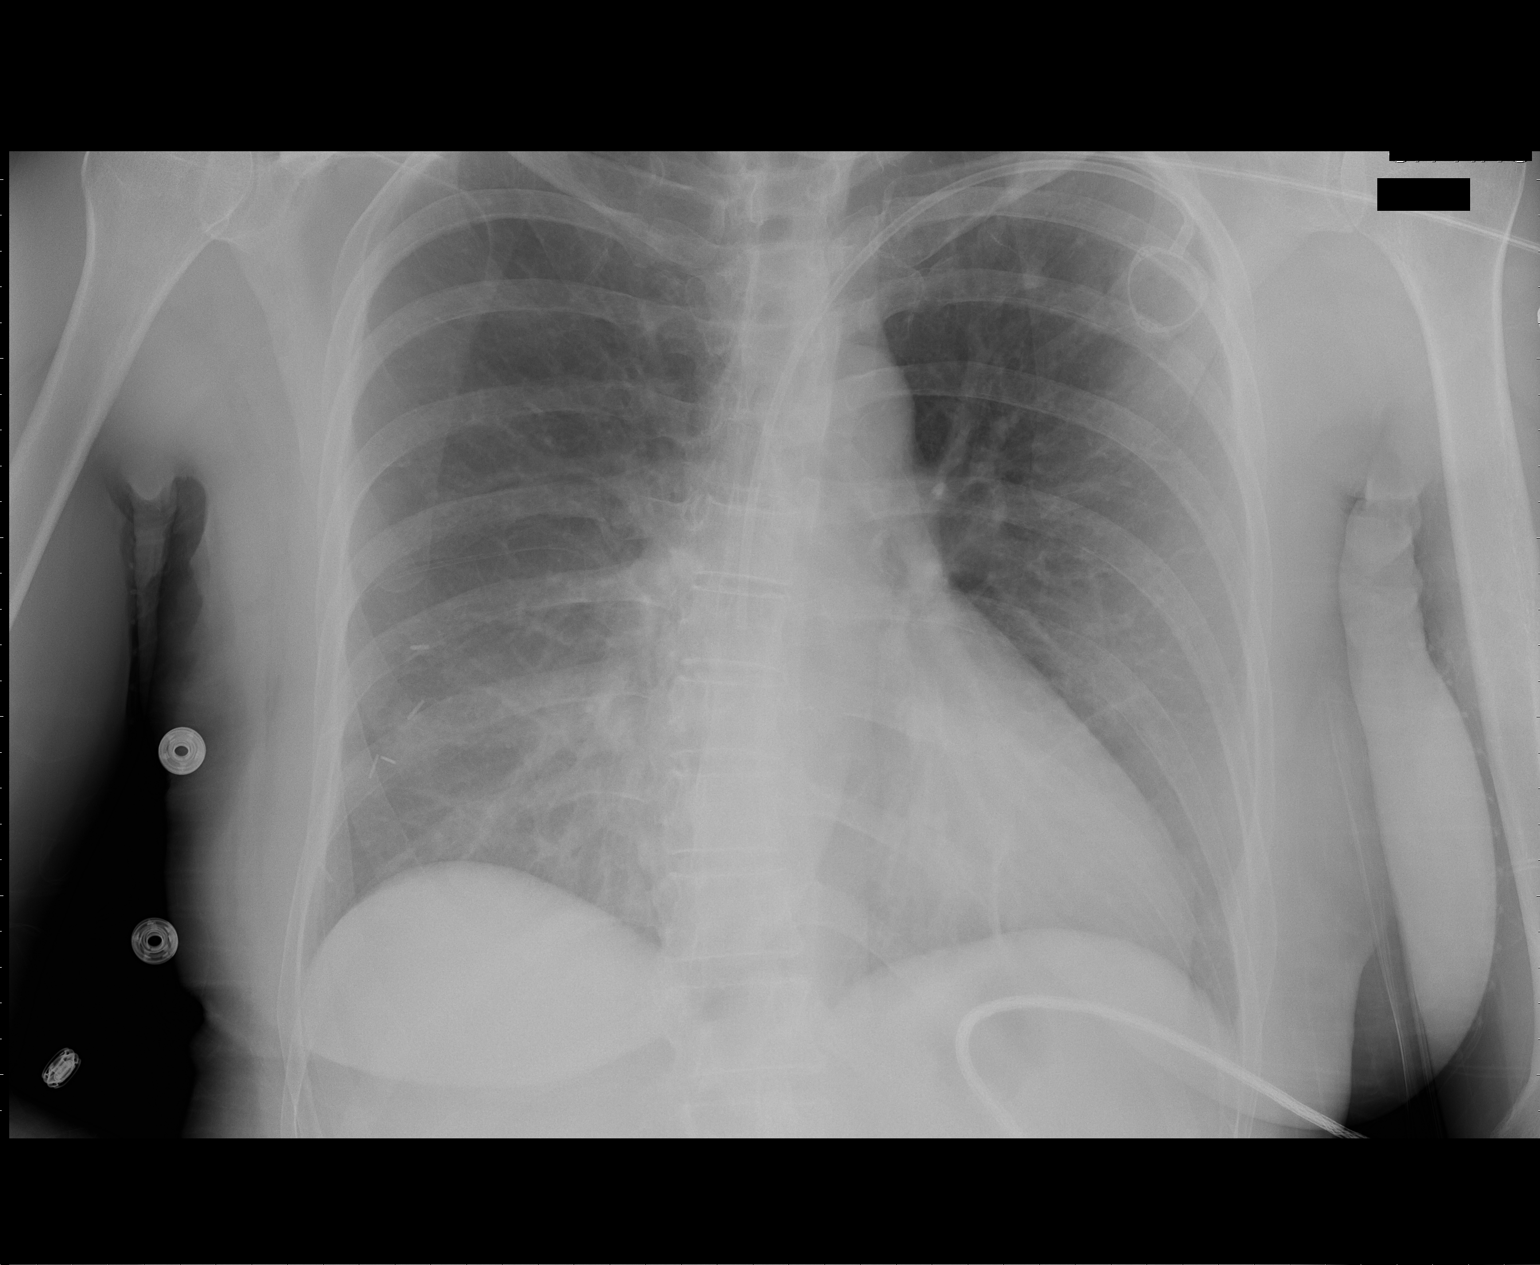

[1 of 1 positions shown; findings below may reference images not displayed]

FINDINGS: Seated AP portable view of the chest at 8132 hours. Left chest
subclavian approach porta cath placed. Catheter tip at the
cavoatrial junction level. Stable cardiac size and mediastinal
contours. No pneumothorax.

Interval postoperative changes to the right chest wall. Mildly lower
lung volumes. No pulmonary edema, pleural effusion or consolidation.
Infrahilar atelectasis. Asymmetric left first rib costochondral
calcifications suspected to explain these stable small density
partially projecting over the posterior left fifth rib. No pulmonary
nodule identified.
IMPRESSION: 1. Left chest porta cath placed with no adverse features. Right
chest wall postoperative changes.
2. Infrahilar atelectasis.

## 2016-11-19 ENCOUNTER — Other Ambulatory Visit: Payer: Self-pay | Admitting: Oncology

## 2016-11-19 DIAGNOSIS — K219 Gastro-esophageal reflux disease without esophagitis: Secondary | ICD-10-CM

## 2016-12-22 ENCOUNTER — Other Ambulatory Visit: Payer: Self-pay | Admitting: Oncology

## 2016-12-22 DIAGNOSIS — Z853 Personal history of malignant neoplasm of breast: Secondary | ICD-10-CM

## 2016-12-26 ENCOUNTER — Ambulatory Visit (HOSPITAL_BASED_OUTPATIENT_CLINIC_OR_DEPARTMENT_OTHER): Payer: BLUE CROSS/BLUE SHIELD | Admitting: Oncology

## 2016-12-26 ENCOUNTER — Other Ambulatory Visit (HOSPITAL_BASED_OUTPATIENT_CLINIC_OR_DEPARTMENT_OTHER): Payer: BLUE CROSS/BLUE SHIELD

## 2016-12-26 VITALS — BP 123/75 | HR 70 | Temp 97.9°F | Resp 18 | Ht 66.0 in | Wt 161.2 lb

## 2016-12-26 DIAGNOSIS — Z79810 Long term (current) use of selective estrogen receptor modulators (SERMs): Secondary | ICD-10-CM

## 2016-12-26 DIAGNOSIS — C50411 Malignant neoplasm of upper-outer quadrant of right female breast: Secondary | ICD-10-CM

## 2016-12-26 DIAGNOSIS — Z170 Estrogen receptor positive status [ER+]: Secondary | ICD-10-CM | POA: Diagnosis not present

## 2016-12-26 LAB — CBC WITH DIFFERENTIAL/PLATELET
BASO%: 0.5 % (ref 0.0–2.0)
BASOS ABS: 0 10*3/uL (ref 0.0–0.1)
EOS%: 1.4 % (ref 0.0–7.0)
Eosinophils Absolute: 0.1 10*3/uL (ref 0.0–0.5)
HEMATOCRIT: 37.1 % (ref 34.8–46.6)
HGB: 12.5 g/dL (ref 11.6–15.9)
LYMPH%: 31.4 % (ref 14.0–49.7)
MCH: 32.4 pg (ref 25.1–34.0)
MCHC: 33.8 g/dL (ref 31.5–36.0)
MCV: 96.1 fL (ref 79.5–101.0)
MONO#: 0.4 10*3/uL (ref 0.1–0.9)
MONO%: 7.9 % (ref 0.0–14.0)
NEUT#: 3.3 10*3/uL (ref 1.5–6.5)
NEUT%: 58.8 % (ref 38.4–76.8)
Platelets: 257 10*3/uL (ref 145–400)
RBC: 3.86 10*6/uL (ref 3.70–5.45)
RDW: 14.4 % (ref 11.2–14.5)
WBC: 5.6 10*3/uL (ref 3.9–10.3)
lymph#: 1.8 10*3/uL (ref 0.9–3.3)

## 2016-12-26 LAB — COMPREHENSIVE METABOLIC PANEL
ALT: 26 U/L (ref 0–55)
ANION GAP: 9 meq/L (ref 3–11)
AST: 28 U/L (ref 5–34)
Albumin: 3.7 g/dL (ref 3.5–5.0)
Alkaline Phosphatase: 78 U/L (ref 40–150)
BUN: 10.4 mg/dL (ref 7.0–26.0)
CALCIUM: 8.8 mg/dL (ref 8.4–10.4)
CHLORIDE: 106 meq/L (ref 98–109)
CO2: 27 meq/L (ref 22–29)
CREATININE: 0.8 mg/dL (ref 0.6–1.1)
EGFR: 84 mL/min/{1.73_m2} — ABNORMAL LOW (ref >90)
Glucose: 107 mg/dl (ref 70–140)
POTASSIUM: 4 meq/L (ref 3.5–5.1)
Sodium: 142 mEq/L (ref 136–145)
Total Bilirubin: 0.4 mg/dL (ref 0.20–1.20)
Total Protein: 6.8 g/dL (ref 6.4–8.3)

## 2016-12-26 MED ORDER — ESTRADIOL 2 MG VA RING: 2.0000 mg | VAGINAL_RING | VAGINAL | 12 refills | Status: DC

## 2016-12-26 MED ORDER — GABAPENTIN 300 MG PO CAPS: ORAL_CAPSULE | ORAL | 4 refills | Status: DC

## 2016-12-26 MED ORDER — TAMOXIFEN CITRATE 20 MG PO TABS: 20.0000 mg | ORAL_TABLET | Freq: Every day | ORAL | 3 refills | Status: DC

## 2016-12-26 NOTE — Progress Notes (Signed)
Bonnie Ward  [phone] [phone]) [phone]    ID: Bonnie Ward DOB: [birth year]  MR#: [id number]  ZHY#:[id number]  Patient Care Team: Bonnie Sails, MD as PCP - General (Internal Medicine) Bonnie Ward, [place] (Inactive) Bonnie Boroughs, FNP as Nurse Practitioner (Nurse Practitioner) Bonnie Skates, MD as Consulting Physician (General Surgery) Bonnie Ward, Bonnie Dad, MD as Consulting Physician (Oncology) Bonnie Silversmith, MD (Inactive) as Consulting Physician (Radiation Oncology) Bonnie Germany, RN as Registered Nurse Bonnie Kaufmann, RN as Registered Nurse Bonnie Bouche, NP as Nurse Practitioner (Nurse Practitioner) Bonnie Miss, MD as Consulting Physician (Neurosurgery) PCP: Bonnie Sails, MD OTHER MD:  CHIEF COMPLAINT: Estrogen receptor positive breast cancer  CURRENT TREATMENT:tamoxifen  BREAST CANCER HISTORY: From the initial intake note:   Bonnie Ward had routine bilateral screening mammography at the [facility] 01/09/2015 showing a possible mass in the right breast. On 12/22/2014 she underwent diagnostic right mammography with tomosynthesis and right breast ultrasonography. The breast density was category C. In the right breast there was an area of increased density with architectural distortion and faint microcalcifications in the upper outer quadrant. There was mild palpable soft tissue thickening in the area in question, but no palpable right axillary lymph nodes. Ultrasound confirmed a 1.7 cm irregular hypoechoic mass. There were no abnormal-appearing right axillary lymph nodes.  Biopsy of the right breast mass in question 12/26/2014 showed ([facility] 16-08/08/2005) an invasive ductal carcinoma, grade 1 or 2, with a prognostic panel still pending.  Her subsequent history is as detailed below  INTERVAL HISTORY: Bonnie Ward returns today for follow-up of her estrogen receptor positive breast cancer. She continues on tamoxifen, with  good tolerance. Vaginal wetness is decreased and the hot flashes are "not bad". She does take gabapentin at bedtime, which helps. She obtains a drug at a good price.  REVIEW OF SYSTEMS: Bonnie Ward feels "great". She works out almost every day. She complains of dry skin. She has a little bit more arthritis involving her hands. She is taking fish oil which is helping. She is not currently using the Estrace cream because of cost issues. A detailed review of systems today was otherwise stable  PAST MEDICAL HISTORY: Past Medical History:  Diagnosis Date  . ACID REFLUX DISEASE 07/12/2010  . Anxiety   . Arachnoid cyst    left frontal lobe  . Arthritis    hands  . Breast cancer (Bonnie Ward) 12/2014   right  . Complication of anesthesia 2012   states woke up during colonoscopy with Propofol  . Dental crowns present   . Difficulty swallowing pills   . Glaucoma   . Hypothyroidism   . Migraines   . Nervous stomach   . Seasonal allergies   . Stress fracture of foot 12/2014   right    PAST SURGICAL HISTORY: Past Surgical History:  Procedure Laterality Date  . COLONOSCOPY WITH PROPOFOL  10/14/2013  . ESOPHAGOGASTRODUODENOSCOPY ENDOSCOPY    . LASIK    . PORTACATH PLACEMENT Left 01/23/2015   Procedure: INSERTION PORT-A-CATH ;  Surgeon: Bonnie Skates, MD;  Location: [facility];  Service: General;  Laterality: Left;  . RADIOACTIVE SEED GUIDED MASTECTOMY WITH AXILLARY SENTINEL LYMPH NODE BIOPSY Right 01/23/2015   Procedure: RIGHT PARTIAL MASTECTOMY WITH RADIOACTIVE SEED LOCALIZATION  WITH RIGHT AXILLARY SENTINEL LYMPH NODE BIOPSY;  Surgeon: Bonnie Skates, MD;  Location: [facility];  Service: General;  Laterality: Right;  . RE-EXCISION OF BREAST LUMPECTOMY Right 02/16/2015   Procedure: RE-EXCISION OF RIGHT  BREAST  LUMPECTOMY MARGINS;  Surgeon: Bonnie Skates, MD;  Location: [facility];  Service: General;  Laterality: Right;  . TONSILLECTOMY  age 37  . TOOTH EXTRACTION       FAMILY HISTORY Family History  Problem Relation Age of Onset  . Osteoporosis Mother   . Irritable bowel syndrome Mother   . Hypertension Mother   . Hypertension Father   . Hyperlipidemia Father   . Diabetes Father   . Dementia Father   . Colon cancer Maternal Grandfather 89  . Breast cancer Paternal Aunt   . Breast cancer Paternal Grandmother    the patient's father died at age 23 from pneumonia. [name] died at age 15 after bowel perforation. The patient had one brother, one sister. There is no history of breast or ovarian cancer in the family.  GYNECOLOGIC HISTORY:  Patient's last menstrual period was 01/25/2011. Menarche age 89, the patient is GX P0. She stopped having periods approximately 2011. She is still on hormone replacement, but is "trickling off it". She did take birth control pills for approximately 3 years remotely with no complications  SOCIAL HISTORY:  Bonnie Ward works as a Haematologist. She is divorced and lives by herself, with 2 dogs.    ADVANCED DIRECTIVES: The patient's brother Bonnie Ward is her healthcare power of attorney. He may be reached in [place] at [place]: Social History  Substance Use Topics  . Smoking status: Former Smoker    Packs/day: 0.00    Years: 0.00    Quit date: 02/19/2005  . Smokeless tobacco: Never Used  . Alcohol use Yes     Comment: occasionally     Colonoscopy: February 2016  PAP: June 2015  Bone density: 12/02/2013-- T - 2.2  Lipid panel:  Allergies  Allergen Reactions  . Lortab [Hydrocodone-Acetaminophen] Anxiety  . Milk-Related Compounds Diarrhea    GI UPSET  . Coreg [Carvedilol] Other (See Comments)    Fatigue     Current Outpatient Prescriptions  Medication Sig Dispense Refill  . bimatoprost (LUMIGAN) 0.03 % ophthalmic solution Place 1 drop into both eyes at bedtime.    Marland Kitchen estradiol (ESTRACE VAGINAL) 0.1 MG/GM vaginal cream Place 1 Applicatorful vaginally at bedtime. 42.5 g  12  . gabapentin (NEURONTIN) 300 MG capsule TAKE 1 CAPSULE(300 MG) BY MOUTH AT BEDTIME 30 capsule 0  . liothyronine (CYTOMEL) 5 MCG tablet Take 5 mcg by mouth 2 (two) times daily.    Marland Kitchen omeprazole (PRILOSEC) 40 MG capsule TAKE 1 CAPSULE(40 MG) BY MOUTH DAILY 30 capsule 0  . tamoxifen (NOLVADEX) 20 MG tablet Take 1 tablet (20 mg total) by mouth daily. 90 tablet 3   No current facility-administered medications for this visit.     OBJECTIVE: Middle-aged white woman in no acute distress  Vitals:   12/26/16 1013  BP: 123/75  Pulse: 70  Resp: 18  Temp: 97.9 F (36.6 C)     Body mass index is 26.02 kg/m.    ECOG FS:0 - Asymptomatic  Sclerae unicteric, pupils round and equal Oropharynx clear and moist No cervical or supraclavicular adenopathy Lungs no rales or rhonchi Heart regular rate and rhythm Abd soft, nontender, positive bowel sounds MSK no focal spinal tenderness, no upper extremity lymphedema Neuro: nonfocal, well oriented, appropriate affect Breasts: The right breast is undergone lumpectomy and radiation, with no evidence of local recurrence. The left breast is benign. Both axillae are benign.  LAB RESULTS:  CMP     Component Value Date/Time  NA 139 06/27/2016 0918   K 4.3 06/27/2016 0918   CL 108 01/21/2009 0828   CO2 25 06/27/2016 0918   GLUCOSE 86 06/27/2016 0918   BUN 14.0 06/27/2016 0918   CREATININE 0.8 06/27/2016 0918   CALCIUM 8.6 06/27/2016 0918   PROT 6.7 06/27/2016 0918   ALBUMIN 3.4 (L) 06/27/2016 0918   AST 26 06/27/2016 0918   ALT 22 06/27/2016 0918   ALKPHOS 65 06/27/2016 0918   BILITOT 0.47 06/27/2016 0918   GFRNONAA 95.01 01/21/2009 0828    INo results found for: SPEP, UPEP  Lab Results  Component Value Date   WBC 5.6 12/26/2016   NEUTROABS 3.3 12/26/2016   HGB 12.5 12/26/2016   HCT 37.1 12/26/2016   MCV 96.1 12/26/2016   PLT 257 12/26/2016      Chemistry      Component Value Date/Time   NA 139 06/27/2016 0918   K 4.3 06/27/2016  0918   CL 108 01/21/2009 0828   CO2 25 06/27/2016 0918   BUN 14.0 06/27/2016 0918   CREATININE 0.8 06/27/2016 0918      Component Value Date/Time   CALCIUM 8.6 06/27/2016 0918   ALKPHOS 65 06/27/2016 0918   AST 26 06/27/2016 0918   ALT 22 06/27/2016 0918   BILITOT 0.47 06/27/2016 0918       No results found for: LABCA2  No components found for: LABCA125  No results for input(s): INR in the last 168 hours.  Urinalysis    Component Value Date/Time   COLORURINE yellow 10/05/2009 1454   APPEARANCEUR Clear 10/05/2009 1454   LABSPEC 1.010 10/05/2009 1454   PHURINE 7.0 10/05/2009 1454   HGBUR 2+ 10/05/2009 1454   BILIRUBINUR neg 04/18/2016 1433   PROTEINUR neg 04/18/2016 1433   UROBILINOGEN negative 04/18/2016 1433   UROBILINOGEN 0.2 10/05/2009 1454   NITRITE neg 04/18/2016 1433   NITRITE negative 10/05/2009 1454   LEUKOCYTESUR Trace (A) 04/18/2016 1433    STUDIES: Repeat mammography is due in June.  ASSESSMENT: 56 y.o. [place] woman status post right breastUpper outer quadrant biopsy 12/26/2014 for a clinical T1 cN0, stage IA invasive ductal carcinoma, grade 1 or 2, estrogen and progesterone receptor positive, with an MIB-1 of 11%, and HER-2 amplified, with a signals ratio of 2.07, number per cell 2.28.  (1) status post right lumpectomy and sentinel lymph node sampling 01/23/2015 for a pT1c pN1, stage IIA invasive ductal carcinoma, grade 1, with focally positive anterior margins  (a) additional surgery 02/16/2015 found residual disease but cleared margins  (2) chemotherapy started 03/05/2015, consisting of doxorubicin and cyclophosphamide in dose dense fashion 4, completed 04/16/2015, followed by paclitaxel weekly x 12 planned cycles, started 04/30/2015,  with trastuzumab and pertuzumab given every 3 weeks.  (a) Paclitaxel stopped after 4 cycles because of progressive neuropathy, last dose 05/28/2015  (b) pertuzumab stopped after 2 cycles (when paclitaxel  stopped)  (3) trastuzumab was continued to complete 1 year-- final dose 05/23/2016  (a) echocardiogram 08/19/2016 shows a >10% drop-- trastuzumab held after 07/30/2015 dose  (b) repeat echocardiogram 10/12/2015 shows EF recovery to 55%, trastuzumab resumed 10/22/2015  (c) echocardiogram 03/07/2016 shows a stable ejection fraction  (d) echocardiogram 06/20/2016 shows an ejection fraction of 55%   (4) adjuvant radiation 07/06/2015-08/21/2015::  Right breast / 45 Gray @ 1.8 Pearline Cables per fraction x 25 fractions Right supraclavicular fossa/PAB 45 Gy '@1' .8 Gy per fraction x 25 fractions Right breast boost / 16 Gray at [employer] per fraction x 8 fractions  (5) started  tamoxifen 09/10/2015  PLAN: Trameka is just about 2 years out from definitive surgery for her breast cancer with no evidence of disease recurrence. This is very favorable.  She continues on tamoxifen, generally with good tolerance.  We are continuing the gabapentin at bedtime which is helping.   I don't know why her estradiol cream jumped up in price to greater than $200 a month. Today we discussed alternatives. After much discussion we decided she is going to give the Estring a chance. I have put in that prescription for her. She will let me know if there is a cost or side effect issue  She is due for mammography in June. At this point I'm going to start seeing her yearly, after her mammogram, so this will be July 2019, and repeat yearly visits until she completes her minimum 5 year follow-up  She knows to call for any problems that may develop before her next visit here.   Chauncey Cruel, MD   12/26/2016 10:26 AM

## 2016-12-29 ENCOUNTER — Other Ambulatory Visit: Payer: Self-pay

## 2016-12-30 ENCOUNTER — Encounter: Payer: Self-pay | Admitting: Oncology

## 2016-12-30 NOTE — Progress Notes (Signed)
Received denial for Estring from [facility] via fax. Gave denial letter to May RN to give to physician showing denial reason/alternatives.

## 2017-01-02 ENCOUNTER — Other Ambulatory Visit: Payer: Self-pay

## 2017-01-02 MED ORDER — ESTROGENS, CONJUGATED 0.625 MG/GM VA CREA: 1.0000 | TOPICAL_CREAM | Freq: Every day | VAGINAL | 12 refills | Status: DC

## 2017-01-04 ENCOUNTER — Other Ambulatory Visit: Payer: Self-pay | Admitting: Oncology

## 2017-01-04 DIAGNOSIS — K219 Gastro-esophageal reflux disease without esophagitis: Secondary | ICD-10-CM

## 2017-01-09 ENCOUNTER — Telehealth: Payer: Self-pay

## 2017-01-09 NOTE — Telephone Encounter (Signed)
Insurance will not pay for estring until she tries 3 other creams. One of the 3 is premarin. Premarin is still $300 with insurance. She cannot afford this. What should she do?

## 2017-01-30 ENCOUNTER — Ambulatory Visit
Admission: RE | Admit: 2017-01-30 | Discharge: 2017-01-30 | Disposition: A | Discharge status: Home / Self Care | Payer: BLUE CROSS/BLUE SHIELD | Source: Ambulatory Visit | Attending: Oncology | Admitting: Oncology

## 2017-01-30 ENCOUNTER — Ambulatory Visit
Admission: RE | Admit: 2017-01-30 | Discharge: 2017-01-30 | Disposition: A | Discharge status: Home / Self Care | Payer: BLUE CROSS/BLUE SHIELD | Source: Ambulatory Visit | Attending: Nurse Practitioner | Admitting: Nurse Practitioner

## 2017-01-30 DIAGNOSIS — Z853 Personal history of malignant neoplasm of breast: Secondary | ICD-10-CM

## 2017-01-30 DIAGNOSIS — M810 Age-related osteoporosis without current pathological fracture: Secondary | ICD-10-CM | POA: Diagnosis not present

## 2017-01-30 DIAGNOSIS — Z8639 Personal history of other endocrine, nutritional and metabolic disease: Secondary | ICD-10-CM

## 2017-01-30 DIAGNOSIS — R928 Other abnormal and inconclusive findings on diagnostic imaging of breast: Secondary | ICD-10-CM | POA: Diagnosis not present

## 2017-01-30 HISTORY — DX: Personal history of antineoplastic chemotherapy: Z92.21

## 2017-01-30 HISTORY — DX: Personal history of irradiation: Z92.3

## 2017-02-05 ENCOUNTER — Other Ambulatory Visit: Payer: Self-pay | Admitting: Oncology

## 2017-02-05 DIAGNOSIS — K219 Gastro-esophageal reflux disease without esophagitis: Secondary | ICD-10-CM

## 2017-02-07 DIAGNOSIS — J22 Unspecified acute lower respiratory infection: Secondary | ICD-10-CM | POA: Diagnosis not present

## 2017-02-07 DIAGNOSIS — K219 Gastro-esophageal reflux disease without esophagitis: Secondary | ICD-10-CM | POA: Diagnosis not present

## 2017-02-07 DIAGNOSIS — J019 Acute sinusitis, unspecified: Secondary | ICD-10-CM | POA: Diagnosis not present

## 2017-02-07 DIAGNOSIS — B9689 Other specified bacterial agents as the cause of diseases classified elsewhere: Secondary | ICD-10-CM | POA: Diagnosis not present

## 2017-02-10 ENCOUNTER — Other Ambulatory Visit: Payer: Self-pay | Admitting: Oncology

## 2017-03-06 ENCOUNTER — Telehealth: Payer: Self-pay | Admitting: Obstetrics and Gynecology

## 2017-03-06 NOTE — Telephone Encounter (Signed)
LMTCB/:NP/ .CX/LETTER SENT/RD

## 2017-03-11 ENCOUNTER — Other Ambulatory Visit: Payer: Self-pay | Admitting: Oncology

## 2017-03-11 DIAGNOSIS — K219 Gastro-esophageal reflux disease without esophagitis: Secondary | ICD-10-CM

## 2017-04-13 DIAGNOSIS — H01001 Unspecified blepharitis right upper eyelid: Secondary | ICD-10-CM | POA: Diagnosis not present

## 2017-04-13 DIAGNOSIS — H401123 Primary open-angle glaucoma, left eye, severe stage: Secondary | ICD-10-CM | POA: Diagnosis not present

## 2017-04-13 DIAGNOSIS — H01002 Unspecified blepharitis right lower eyelid: Secondary | ICD-10-CM | POA: Diagnosis not present

## 2017-04-13 DIAGNOSIS — H01005 Unspecified blepharitis left lower eyelid: Secondary | ICD-10-CM | POA: Diagnosis not present

## 2017-04-13 DIAGNOSIS — H01004 Unspecified blepharitis left upper eyelid: Secondary | ICD-10-CM | POA: Diagnosis not present

## 2017-05-01 ENCOUNTER — Encounter: Payer: Self-pay | Admitting: Obstetrics and Gynecology

## 2017-05-01 ENCOUNTER — Ambulatory Visit: Payer: BLUE CROSS/BLUE SHIELD | Admitting: Nurse Practitioner

## 2017-05-01 ENCOUNTER — Other Ambulatory Visit (HOSPITAL_COMMUNITY)
Admission: RE | Admit: 2017-05-01 | Discharge: 2017-05-01 | Disposition: A | Discharge status: Home / Self Care | Payer: BLUE CROSS/BLUE SHIELD | Source: Ambulatory Visit | Attending: Obstetrics and Gynecology | Admitting: Obstetrics and Gynecology

## 2017-05-01 ENCOUNTER — Ambulatory Visit (INDEPENDENT_AMBULATORY_CARE_PROVIDER_SITE_OTHER): Payer: BLUE CROSS/BLUE SHIELD | Admitting: Obstetrics and Gynecology

## 2017-05-01 VITALS — BP 122/60 | HR 76 | Resp 14 | Ht 66.0 in | Wt 156.0 lb

## 2017-05-01 DIAGNOSIS — Z9221 Personal history of antineoplastic chemotherapy: Secondary | ICD-10-CM

## 2017-05-01 DIAGNOSIS — R293 Abnormal posture: Secondary | ICD-10-CM | POA: Diagnosis not present

## 2017-05-01 DIAGNOSIS — Z124 Encounter for screening for malignant neoplasm of cervix: Secondary | ICD-10-CM

## 2017-05-01 DIAGNOSIS — Z01419 Encounter for gynecological examination (general) (routine) without abnormal findings: Secondary | ICD-10-CM | POA: Diagnosis not present

## 2017-05-01 DIAGNOSIS — N952 Postmenopausal atrophic vaginitis: Secondary | ICD-10-CM

## 2017-05-01 DIAGNOSIS — M542 Cervicalgia: Secondary | ICD-10-CM | POA: Diagnosis not present

## 2017-05-01 DIAGNOSIS — Z0000 Encounter for general adult medical examination without abnormal findings: Secondary | ICD-10-CM | POA: Diagnosis not present

## 2017-05-01 DIAGNOSIS — Z853 Personal history of malignant neoplasm of breast: Secondary | ICD-10-CM | POA: Diagnosis not present

## 2017-05-01 DIAGNOSIS — R635 Abnormal weight gain: Secondary | ICD-10-CM

## 2017-05-01 DIAGNOSIS — K5900 Constipation, unspecified: Secondary | ICD-10-CM

## 2017-05-01 DIAGNOSIS — L853 Xerosis cutis: Secondary | ICD-10-CM

## 2017-05-01 DIAGNOSIS — M818 Other osteoporosis without current pathological fracture: Secondary | ICD-10-CM

## 2017-05-01 DIAGNOSIS — M79671 Pain in right foot: Secondary | ICD-10-CM | POA: Diagnosis not present

## 2017-05-01 DIAGNOSIS — Z9229 Personal history of other drug therapy: Secondary | ICD-10-CM

## 2017-05-01 DIAGNOSIS — M25511 Pain in right shoulder: Secondary | ICD-10-CM | POA: Diagnosis not present

## 2017-05-01 NOTE — Patient Instructions (Signed)

## 2017-05-01 NOTE — Progress Notes (Signed)
56 y.o. G0P0000 DivorcedCaucasianF here for annual exam.   The patient has a h/o right breast cancer. S/P lumpectomy in 6/16, then chemo and radiation. She is on tamoxifen. She was given a script for estring by the oncology nurse practitioner. She didn't get it secondary to expense. She c/o vaginal dryness, not currently sexually active, but would like the ability.  No vaginal bleeding. She c/o weight gain, dry skin, constipation.    Patient's last menstrual period was 01/25/2011.          Sexually active: No.  The current method of family planning is post menopausal status.    Exercising: Yes.    cycling/yoga/ cardio/ spin class Smoker:  Former smoker   Health Maintenance: Pap:  04-18-16 WNL NEGHR HPV 04-13-15 WNL + HPV History of abnormal Pap:  yes MMG:  01-30-17 WNL  Colonoscopy:  10-14-13 polyps repeat in 5 yrs  BMD:  01-30-17 osteoporotic, -2.5 in her right hip. Started on Tamoxifen at the of 2016. Prior DEXA was in 4/15 (she was on HRT at that time).  TDaP:  2013 Gardasil: N/A   reports that she quit smoking about 12 years ago. She smoked 0.00 packs per day for 0.00 years. She has never used smokeless tobacco. She reports that she drinks alcohol. She reports that she does not use drugs. Drinks a couple of drinks a week. She is a Probation officer.   Past Medical History:  Diagnosis Date  . ACID REFLUX DISEASE 07/12/2010  . Anxiety   . Arachnoid cyst    left frontal lobe  . Arthritis    hands  . Breast cancer ([facility]) 12/2014   right  . Complication of anesthesia 2012   states woke up during colonoscopy with Propofol  . Dental crowns present   . Difficulty swallowing pills   . Glaucoma   . Hypothyroidism   . Migraines   . Nervous stomach   . Personal history of chemotherapy 2016  . Personal history of radiation therapy 2016   Rt breast  . Seasonal allergies   . Stress fracture of foot 12/2014   right    Past Surgical History:  Procedure Laterality Date  . COLONOSCOPY WITH  PROPOFOL  10/14/2013  . ESOPHAGOGASTRODUODENOSCOPY ENDOSCOPY    . LASIK    . PORTACATH PLACEMENT Left 01/23/2015   Procedure: INSERTION PORT-A-CATH ;  Surgeon: Fanny Skates, MD;  Location: [facility];  Service: General;  Laterality: Left;  . RADIOACTIVE SEED GUIDED MASTECTOMY WITH AXILLARY SENTINEL LYMPH NODE BIOPSY Right 01/23/2015   Procedure: RIGHT PARTIAL MASTECTOMY WITH RADIOACTIVE SEED LOCALIZATION  WITH RIGHT AXILLARY SENTINEL LYMPH NODE BIOPSY;  Surgeon: Fanny Skates, MD;  Location: [facility];  Service: General;  Laterality: Right;  . RE-EXCISION OF BREAST LUMPECTOMY Right 02/16/2015   Procedure: RE-EXCISION OF RIGHT  BREAST LUMPECTOMY MARGINS;  Surgeon: Fanny Skates, MD;  Location: [facility];  Service: General;  Laterality: Right;  . TONSILLECTOMY  age 66  . TOOTH EXTRACTION      Current Outpatient Prescriptions  Medication Sig Dispense Refill  . gabapentin (NEURONTIN) 300 MG capsule TAKE 1 CAPSULE(300 MG) BY MOUTH AT BEDTIME 30 capsule 3  . Omega-3 Fatty Acids (FISH OIL) 1000 MG CPDR Take by mouth.    Marland Kitchen omeprazole (PRILOSEC) 40 MG capsule TAKE 1 CAPSULE(40 MG) BY MOUTH DAILY 30 capsule 0  . tamoxifen (NOLVADEX) 20 MG tablet Take 1 tablet (20 mg total) by mouth daily. 90 tablet 3  . RESTASIS  MULTIDOSE 0.05 % ophthalmic emulsion INT 1 GTT INTO OU BID  3   No current facility-administered medications for this visit.     Family History  Problem Relation Age of Onset  . Osteoporosis Mother   . Irritable bowel syndrome Mother   . Hypertension Mother   . Hypertension Father   . Hyperlipidemia Father   . Diabetes Father   . Dementia Father   . Colon cancer Maternal Grandfather 40  . Breast cancer Paternal Aunt     Review of Systems  Constitutional: Negative.        Weight gain   HENT: Negative.   Eyes: Negative.   Respiratory: Negative.   Cardiovascular: Positive for palpitations.  Gastrointestinal: Negative.   Endocrine:  Negative.   Genitourinary: Negative.        Loss of sexual interest  Vaginal dryness  Hot flashes   Musculoskeletal: Negative.   Skin: Negative.   Allergic/Immunologic: Negative.   Neurological: Negative.   Psychiatric/Behavioral: Negative.     Exam:   BP 122/60 (BP Location: Right Arm, Patient Position: Sitting, Cuff Size: Normal)   Pulse 76   Resp 14   Ht 5\' 6"  (1.676 m)   Wt 156 lb (70.8 kg)   LMP 01/25/2011   BMI 25.18 kg/m   Weight change: @WEIGHTCHANGE @ Height:   Height: 5\' 6"  (167.6 cm)  Ht Readings from Last 3 Encounters:  05/01/17 5\' 6"  (1.676 m)  12/26/16 5\' 6"  (1.676 m)  06/27/16 5\' 6"  (1.676 m)    General appearance: alert, cooperative and appears stated age Head: Normocephalic, without obvious abnormality, atraumatic Neck: no adenopathy, supple, symmetrical, trachea midline and thyroid normal to inspection and palpation Lungs: clear to auscultation bilaterally Cardiovascular: regular rate and rhythm Breasts: normal appearance, no masses or tenderness, evidence of right lumpectomy Abdomen: soft, non-tender; bowel sounds normal; no masses,  no organomegaly Extremities: extremities normal, atraumatic, no cyanosis or edema Skin: Skin color, texture, turgor normal. No rashes or lesions Lymph nodes: Cervical, supraclavicular, and axillary nodes normal. No abnormal inguinal nodes palpated Neurologic: Grossly normal   Pelvic: External genitalia:  no lesions              Urethra:  normal appearing urethra with no masses, tenderness or lesions              Bartholins and Skenes: normal                 Vagina: normal appearing atrophic vagina with normal color and discharge, no lesions              Cervix: no lesions               Bimanual Exam:  Uterus: limited exam secondary to patient's vaginal discomfort, no masses.              Adnexa: no mass, fullness, tenderness               Rectovaginal: deferred, patient very uncomfortable with the pelvic  exam  Chaperone was present for exam.  A:  Well Woman with normal exam  H/O breast cancer, on tamoxifen  Vaginal atrophy  Osteoporosis, on Tamoxifen  Weight gain, constipation, dry skin  P:   Pap with hpv  Mammogram and colonoscopy are UTD  Generic of the vaginal estrogen  Discuss osteoporosis treatment (I will also discuss with her Oncologist)  She should be getting 1,200 mg a day of calcium with vit d  Recent normal CMP and CBC  Vit D, TSH, Lipid panel   UTD handout on osteoporosis treatment given  CC: Dr Jana Hakim

## 2017-05-02 ENCOUNTER — Telehealth: Payer: Self-pay | Admitting: Obstetrics and Gynecology

## 2017-05-02 LAB — LIPID PANEL
Chol/HDL Ratio: 2.6 ratio (ref 0.0–4.4)
Cholesterol, Total: 193 mg/dL (ref 100–199)
HDL: 74 mg/dL (ref >39)
LDL CALC: 89 mg/dL (ref 0–99)
TRIGLYCERIDES: 151 mg/dL — AB (ref 0–149)
VLDL Cholesterol Cal: 30 mg/dL (ref 5–40)

## 2017-05-02 LAB — VITAMIN D 25 HYDROXY (VIT D DEFICIENCY, FRACTURES): Vit D, 25-Hydroxy: 28.5 ng/mL — ABNORMAL LOW (ref 30.0–100.0)

## 2017-05-02 LAB — TSH: TSH: 1.5 u[IU]/mL (ref 0.450–4.500)

## 2017-05-02 MED ORDER — ESTRADIOL 10 MCG VA TABS: 1.0000 | ORAL_TABLET | VAGINAL | 3 refills | Status: DC

## 2017-05-02 NOTE — Telephone Encounter (Signed)
Please review message below regarding RX that was suppose to be sent yesterday.

## 2017-05-02 NOTE — Telephone Encounter (Signed)
Spoke with patient and informed that RX was sent in -eh

## 2017-05-02 NOTE — Telephone Encounter (Signed)
Patient called requesting to speak with the nurse about a prescription that the patient said should have been sent in yesterday. She does not remember the name of the medication only that it was for "possible vaginal irritation."   New pharmacy below. Patient said she reported this change yesterday. Please update her record.  [facility] on [address]

## 2017-05-02 NOTE — Telephone Encounter (Signed)
Please let her know that the generic vagifem has been sent to the pharmacy. Sorry for the delay.

## 2017-05-03 LAB — CYTOLOGY - PAP
Diagnosis: NEGATIVE
HPV (WINDOPATH): NOT DETECTED

## 2017-05-08 DIAGNOSIS — R293 Abnormal posture: Secondary | ICD-10-CM | POA: Diagnosis not present

## 2017-05-08 DIAGNOSIS — M25511 Pain in right shoulder: Secondary | ICD-10-CM | POA: Diagnosis not present

## 2017-05-08 DIAGNOSIS — M79671 Pain in right foot: Secondary | ICD-10-CM | POA: Diagnosis not present

## 2017-05-08 DIAGNOSIS — M542 Cervicalgia: Secondary | ICD-10-CM | POA: Diagnosis not present

## 2017-05-15 DIAGNOSIS — M542 Cervicalgia: Secondary | ICD-10-CM | POA: Diagnosis not present

## 2017-05-15 DIAGNOSIS — R293 Abnormal posture: Secondary | ICD-10-CM | POA: Diagnosis not present

## 2017-05-15 DIAGNOSIS — M79671 Pain in right foot: Secondary | ICD-10-CM | POA: Diagnosis not present

## 2017-05-15 DIAGNOSIS — M25511 Pain in right shoulder: Secondary | ICD-10-CM | POA: Diagnosis not present

## 2017-05-18 ENCOUNTER — Telehealth: Payer: Self-pay

## 2017-05-18 NOTE — Telephone Encounter (Signed)
I agree, if she has bad reflux, fosamax is not the best medication for her. Prolia would be a better choice.  Most people tolerate the prolia well. The most common side effects are musculoskeletal pain, elevation in cholesterol, cystitis and rash (these were not common, just increased compared to placebo).  If she is agreeable, please work on pre-approval.

## 2017-05-18 NOTE — Telephone Encounter (Signed)
Spoke with patient. Advised of message as seen below from [place]. Patient verbalizes understanding. Patient would like to proceed to see if Prolia will be approved and the cost of the medication.  Routing to [employer]  for pre-authorization of Prolia 60 mg/ml. Patient is unable to take biphosphates due to GERD. Patient has osteoporosis.

## 2017-05-18 NOTE — Telephone Encounter (Signed)
Bonnie Dom, MD  Takeem Krotzer, Harley Hallmark, RN        Please let the patient know that Dr Jana Hakim recommended we treat her osteoporosis with medication in addition to the tamoxifen.  I would recommend fosamax 70 mg a week. We talked about options when she was here and I gave her information.  Please see if she wants to come in and discuss this further or feels she understands and wants to start treatment.  Please explain the dosing on an empty stomach with a full glass of water and that she needs to stay up right for at least 30 minutes.  Biggest risk is of stomach or esophageal irritation. Rare complications are atypical fractures or osteonecrosis of the jaw (typically in patients on the medication for cancer treatment).  It is important that she get 1,200 mg of calcium a day and continue the vit d as previously directed.

## 2017-05-18 NOTE — Telephone Encounter (Signed)
Please hold onto her chart to wait for the prolia approval. I will close the encounter

## 2017-05-18 NOTE — Telephone Encounter (Signed)
I have started the prior authorization for Prolia with [facility] reference [id number]. Prior authorization form and clinicals have been faxed to [phone]. [facility] phone number is [phone]. All paperwork and forms are in the Prolia green book.   [employer]

## 2017-05-18 NOTE — Telephone Encounter (Signed)
Spoke with patient. Message given as seen below from [place]. Patient is worried about starting Fosamax. Reports she has been researching it online and isaw that is can cause reflux. Reports she has severe reflux and takes 2 Prilosec every morning. Does not want to take something that can cause her increased reflux problems. Reviewed the option for Prolia with the patient. Patient is more open to this idea. Advised will review with Dr.Jertson and return call.

## 2017-06-29 ENCOUNTER — Other Ambulatory Visit: Payer: Self-pay | Admitting: Oncology

## 2017-06-29 DIAGNOSIS — K219 Gastro-esophageal reflux disease without esophagitis: Secondary | ICD-10-CM

## 2017-07-05 ENCOUNTER — Telehealth: Payer: Self-pay

## 2017-07-05 DIAGNOSIS — M810 Age-related osteoporosis without current pathological fracture: Secondary | ICD-10-CM

## 2017-07-05 MED ORDER — DENOSUMAB 60 MG/ML ~~LOC~~ SOLN: 60.0000 mg | SUBCUTANEOUS | 1 refills | Status: DC

## 2017-07-05 NOTE — Telephone Encounter (Signed)
Call to [place] to check on status of PA submitted by Magdalene Patricia for [employer] On hold for 20 minutes with no answer. Order for Prolia 60 mg/ml #1 1RF sent to [facility] which is the patient's specialty pharmacy

## 2017-07-11 NOTE — Telephone Encounter (Signed)
Call to [facility]. Spoke with [place] who states that the patients benefits for Prolia are being checked and the patient will be called to schedule shipment.

## 2017-07-19 NOTE — Telephone Encounter (Signed)
Spoke with [employer] [facility] who states the patient's benefits for Prolia are still being reviewed. Advised this needs to be completed as soon as possible as we have been trying to get this medication to the office for 14 days. Order changed to STAT processing and the patient is to receive a call today.

## 2017-07-24 ENCOUNTER — Ambulatory Visit: Payer: BLUE CROSS/BLUE SHIELD | Admitting: Sports Medicine

## 2017-07-24 ENCOUNTER — Encounter: Payer: Self-pay | Admitting: Sports Medicine

## 2017-07-24 VITALS — BP 120/70 | Ht 66.0 in | Wt 155.0 lb

## 2017-07-24 DIAGNOSIS — Z87312 Personal history of (healed) stress fracture: Secondary | ICD-10-CM

## 2017-07-24 DIAGNOSIS — M79672 Pain in left foot: Secondary | ICD-10-CM

## 2017-07-24 NOTE — Patient Instructions (Signed)
Osteoporosis [address] [place] [address]  Thurs 08/03/17 at 845am with Dr. Layne Benton [phone]

## 2017-07-24 NOTE — Progress Notes (Signed)
Subjective:    Patient ID: Bonnie Ward, female    DOB: 1961-01-24, 56 y.o.   MRN: [id number]  Bonnie Ward has a PMH of Stress fractures, and previously treated breast cancer (currently in remission) that has left her with osteoporosis and some sensory loss in her feet. She is a former runner who had to give it up because it was causing her too much pain "everywhere." She is still active with HIT, yoga, spin and piliates. She has had stress fractures on and off for the past couple of years that she self treats with a boot for about 4-6 weeks. It will often alternate between the right and left foot but it is becoming increasingly bothersome to wear the bulky boot at work. 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 For the past 4 weeks she has noticed that her left foot has become more swollen compared to her right with some increased redness. The pain is typically a 5/10 and is worse when walking. She can walk on her heels without any discomfort, but when taking a step she feels a sharp pain over the top of her foot. She has been taking Advil daily with some relief.      Review of Systems  Constitutional: Negative.   HENT: Negative.   Eyes: Negative.   Respiratory: Negative.   Cardiovascular: Negative.   Gastrointestinal: Negative.   Endocrine: Negative.   Genitourinary: Negative.   Musculoskeletal: Positive for gait problem and joint swelling.       Left Foot Pain  Skin: Positive for color change (left foot).  Allergic/Immunologic: Negative.   Neurological: Positive for numbness (Chronic, left foot).  Hematological: Negative.   Psychiatric/Behavioral: Negative.        Objective:   Physical Exam  Constitutional: She is oriented to person, place, and time. She appears well-developed and well-nourished. No distress.  HENT:  Head: Normocephalic and atraumatic.  Eyes: Conjunctivae and EOM are normal. Pupils are equal, round, and reactive to light.  Neck: Normal  range of motion. Neck supple.  Pulmonary/Chest: Effort normal.  Musculoskeletal:       Right ankle: She exhibits normal range of motion, no swelling, no ecchymosis and no deformity. No tenderness.       Left ankle: She exhibits normal range of motion, no swelling, no ecchymosis and no deformity. No tenderness.       Right foot: There is normal range of motion, no tenderness, no bony tenderness, no swelling and no deformity.       Left foot: There is normal range of motion.       Feet:  Dorsum of the left foot mildy swollen when compared to the right. Left foot has some color changes over areas of swelling when compared to the right foot (increased redness). Tenderness to palpation over first and second left metatarsal. Toes have full ROM b/l. Ankles have full ROM without pain b/l. Dorsiflexion/Plantar flexion 5/5 bilaterally without pain. Sensation to light touch intact over dorsum of the foot and toes b/l. Decreased sensation to light touch on plantar side of big toes.  Neurological: She is alert and oriented to person, place, and time.  Skin: She is not diaphoretic.     Limited ultrasound of the left foot was performed. Images in long and short axis obtained. There is an obvious area of cortical defect in the distal second metatarsal shaft. Minimal healing seen. Visualized portion of the third metatarsal appeared unremarkable. Findings consistent with  second metatarsal stress fracture/possible complete fracture.     Assessment & Plan:   #Acute on Chronic Stress Fracture of 2nd left metatarsal As evidenced by pain to palpation on physical exam with associated swelling and increased erythema and ultrasound findings in the office. Likely secondary to osteoporosis and HIT. Will fit patient with a smaller post-op shoe and send for Xrays for confirmation. Will also refer to Osteoporosis clinic with Dr. Layne Benton. Recommended discontinuation of HIT and modification of activity with yoga and piliates.  Will call patient with results and schedule appropriate follow up. - AP + Lat b/l foot - Post-Op shoe fitted and given to patient - Referral to Dr. Kathrin Penner Osteoporosis clinic - Will call with results

## 2017-08-02 ENCOUNTER — Other Ambulatory Visit: Payer: Self-pay | Admitting: Oncology

## 2017-08-02 DIAGNOSIS — K219 Gastro-esophageal reflux disease without esophagitis: Secondary | ICD-10-CM

## 2017-08-11 NOTE — Telephone Encounter (Signed)
Left message to call Kaitlyn at [phone]. 

## 2017-08-14 DIAGNOSIS — M810 Age-related osteoporosis without current pathological fracture: Secondary | ICD-10-CM | POA: Diagnosis not present

## 2017-08-17 NOTE — Telephone Encounter (Signed)
Spoke with patient. Advised Prolia has been delivered to the office. Ready to schedule nurse visit. Appointment scheduled for 08/18/2017 at 10 am. Patient is agreeable to date and time.  Routing to provider for final review. Patient agreeable to disposition. Will close encounter.

## 2017-08-18 ENCOUNTER — Ambulatory Visit: Payer: BLUE CROSS/BLUE SHIELD

## 2017-08-18 ENCOUNTER — Telehealth: Payer: Self-pay | Admitting: Obstetrics and Gynecology

## 2017-08-18 NOTE — Telephone Encounter (Signed)
Routing to Dr. Jertson FYI, will close encounter.  

## 2017-08-18 NOTE — Progress Notes (Deleted)
Patient in today for first Prolia injection. Patient's initial calcium level was obtained on 12/26/16.  Result: 8.8.  Last AEX: 05/01/17 JJ Last BMD: 01-30-17 osteoporotic, -2.5 in her right hip. Started on Tamoxifen at the of 2016. Prior DEXA was in 4/15 (she was on HRT at that time).   Injection given in {RIGHT/LEFT:20294} ***.  Patient tolerated injection well.  Routed to provider for review.

## 2017-08-18 NOTE — Telephone Encounter (Signed)
Patient cancelled her Prolia injection for today and did not want to reschedule at this time.

## 2017-08-24 ENCOUNTER — Telehealth: Payer: Self-pay | Admitting: Obstetrics and Gynecology

## 2017-08-24 NOTE — Telephone Encounter (Signed)
Patient would like to speak with nurse about prolia. She is not sure if she want to proceed with getting the shots.

## 2017-08-24 NOTE — Telephone Encounter (Signed)
Spoke with patient. Patient states she has decided not to proceed with Prolia injection for treatment of osteoporosis.   Medication has been shipped and received in office, patient asking what next steps are for refund process or of this has been paid by insurance?   Patient states she was advised she will need to reschedule injection within 7 days. Advised patient we typically schedule prolia injection in coordination with delivery date once verified. Advised I will clarify timeframe and return call.   Patient declined to schedule OV to discuss alternative treatment options with Dr. Talbert Nan, states she plans to increase her calcium intake.   Advised patient will update Dr. Talbert Nan and review with nursing supervisor for return call regarding Prolia RX. Patient verbalizes understanding and is agreeable.   Routing to Dr. Rosann Auerbach   Cc: Lamont Snowball, RN

## 2017-08-25 NOTE — Telephone Encounter (Signed)
Follow-up call to patient. Per ROI, can leave message on voice mail. Voice mail message has name confirmation.  Left message we are checking with drug rep to confirm length of time mediation can be used once delivered to our office. Medication expiration date 10-2019. Medication is in refrigerator.  As far as retruning medication, this is between her and the mail order pharmacy an she is encouraged to contact the pharmacy directly.  Left message to call back with questions.

## 2017-08-28 NOTE — Telephone Encounter (Signed)
Call to patient. Left message on voice mail (ok per DPR). Voice mail confirms first and last name.  Left message providing update from Prolia rep that medication is useable until expiration date as long as it remains refrigerated. Advised it is refrigerated in our office. Advised again, returning medication will be between patient and mail order pharmacy. Recommend she contact pharmacy regarding this. If they will not allow return, recommend she pick up medication since it officially her medication. Left message to call back for Gay Filler or Sharee Pimple.

## 2017-08-28 NOTE — Telephone Encounter (Signed)
Spoke with Joycelyn Schmid, Prolia rep. Was advised Prolia is good until expiration date on box, as long as kept refrigerated. If Prolia becomes warm or to room temp, keep out of fridge, must be used within 2 weeks.   Routing to S. Orvan Seen, RN

## 2017-08-29 ENCOUNTER — Telehealth: Payer: Self-pay | Admitting: *Deleted

## 2017-08-29 MED ORDER — VENLAFAXINE HCL ER 37.5 MG PO CP24: ORAL_CAPSULE | ORAL | 3 refills | Status: DC

## 2017-08-29 NOTE — Telephone Encounter (Signed)
This RN returned call to pt per her request- Bonnie Ward states she is having increased hot flashes that are interfering with sleep.  She would like to try the effexor discussed at last visit.  Pt is currently on gabapentin 300 mg nightly, discussed with pt possible increasing of dose for benefit but wanted to know more about what is happening for best advice.  Josefine states " when I first started it - it seemed to help my sleep better but now I find that when I am having hot flashes at night - and wake up - my mind doesn't want to go back to sleep"  Per above discussion - this RN validated her issues as well as that the effexor could be of benefit due to increasing of serontonin that would allow her mind not to ruminate at night.  Plan per call is pt will start the Effexor at dose of 37.5 mg - post 1 week if she notices benefit she can continue on this dose- otherwise she will increase the dose to 2 tabs and understands she should notice any benefit by 14 days on medication.  Dorinda should call this RN with update and if sleep is improved due to decreased mental rumination but she is continuing having hot flashes - discussion of increasing gabapentin may be needed.  Pharmacy verified and prescription escribed.  Of note - pt did state concern due to " will this antidepressant cause me to gain weight ? "  This RN informed pt above is not a known side effect for Effexor - but will monitor and address if concern arises.  This note will be sent to MD for review of plan - and any further recommendations if needed.

## 2017-08-30 NOTE — Telephone Encounter (Signed)
No patient response, ok to close encounter?

## 2017-08-31 DIAGNOSIS — H01005 Unspecified blepharitis left lower eyelid: Secondary | ICD-10-CM | POA: Diagnosis not present

## 2017-08-31 DIAGNOSIS — H01004 Unspecified blepharitis left upper eyelid: Secondary | ICD-10-CM | POA: Diagnosis not present

## 2017-08-31 DIAGNOSIS — H01002 Unspecified blepharitis right lower eyelid: Secondary | ICD-10-CM | POA: Diagnosis not present

## 2017-08-31 DIAGNOSIS — H01001 Unspecified blepharitis right upper eyelid: Secondary | ICD-10-CM | POA: Diagnosis not present

## 2017-08-31 NOTE — Telephone Encounter (Signed)
Patient in office to pick up Prolia injection. Advised Prolia is good until expiration date on box 10/2019, if kept refrigerated. If medication comes to room temp, keep out of fridge, must be used within 2 weeks. Ice packs provided to patient for transportation.   Patient verbalizes understanding and is agreeable.   Routing to provider for final review. Patient is agreeable to disposition. Will close encounter.   Cc: Lamont Snowball, RN

## 2017-09-01 ENCOUNTER — Other Ambulatory Visit: Payer: Self-pay | Admitting: Hematology and Oncology

## 2017-09-01 ENCOUNTER — Telehealth: Payer: Self-pay | Admitting: *Deleted

## 2017-09-01 DIAGNOSIS — K219 Gastro-esophageal reflux disease without esophagitis: Secondary | ICD-10-CM

## 2017-09-01 NOTE — Telephone Encounter (Signed)
This RN spoke with pt per her call stating issues of side effects upon taking effexor 37.5.  Bonnie Ward states " I had terrible sleep - it was like I was dreaming but I was awake - really weird"  " then the next morning it was like I was a zombie that I couldn't even go work out and I had body tremors "  She states the positive side " I overall felt more calm - and then didn't have even one hot flash the following day"  She has not attempted to retake more doses.  Above will be reviewed with MD per pt's request for something to increase her serotonin as well as help with the hot flashes.  Return call number given as [phone]

## 2017-09-04 ENCOUNTER — Telehealth: Payer: Self-pay | Admitting: *Deleted

## 2017-09-04 DIAGNOSIS — E559 Vitamin D deficiency, unspecified: Secondary | ICD-10-CM | POA: Diagnosis not present

## 2017-09-04 DIAGNOSIS — R5383 Other fatigue: Secondary | ICD-10-CM | POA: Diagnosis not present

## 2017-09-04 DIAGNOSIS — M810 Age-related osteoporosis without current pathological fracture: Secondary | ICD-10-CM | POA: Diagnosis not present

## 2017-09-04 NOTE — Telephone Encounter (Signed)
This RN returned call to pt per issues post taking effexor.  Discussed possible retrial of medication if pt desires - but she would need to be aware that she may have to be prepared for adjustment and plan her activities appropriately.  Pt could also increase her gabapentin to 600 mg nightly  Bonnie Ward has CBD oil in her home - she has not tried this supplement. Supplement discussed regarding possible benefits.  Bonnie Ward stated she will think about her choices and see how she wants to proceed.  She understands to call this RN if she has further concerns.

## 2017-09-07 DIAGNOSIS — M810 Age-related osteoporosis without current pathological fracture: Secondary | ICD-10-CM | POA: Diagnosis not present

## 2017-09-07 DIAGNOSIS — M84374D Stress fracture, right foot, subsequent encounter for fracture with routine healing: Secondary | ICD-10-CM | POA: Diagnosis not present

## 2017-09-28 ENCOUNTER — Other Ambulatory Visit: Payer: Self-pay | Admitting: Hematology and Oncology

## 2017-09-28 DIAGNOSIS — K219 Gastro-esophageal reflux disease without esophagitis: Secondary | ICD-10-CM

## 2017-10-18 NOTE — Telephone Encounter (Signed)
No entry 

## 2017-10-28 ENCOUNTER — Other Ambulatory Visit: Payer: Self-pay | Admitting: Hematology and Oncology

## 2017-10-28 DIAGNOSIS — K219 Gastro-esophageal reflux disease without esophagitis: Secondary | ICD-10-CM

## 2017-11-28 ENCOUNTER — Other Ambulatory Visit: Payer: Self-pay | Admitting: Hematology and Oncology

## 2017-11-28 DIAGNOSIS — K219 Gastro-esophageal reflux disease without esophagitis: Secondary | ICD-10-CM

## 2017-12-26 ENCOUNTER — Other Ambulatory Visit: Payer: Self-pay | Admitting: Hematology and Oncology

## 2017-12-26 DIAGNOSIS — K219 Gastro-esophageal reflux disease without esophagitis: Secondary | ICD-10-CM

## 2017-12-27 ENCOUNTER — Other Ambulatory Visit: Payer: Self-pay | Admitting: Oncology

## 2017-12-27 DIAGNOSIS — Z853 Personal history of malignant neoplasm of breast: Secondary | ICD-10-CM

## 2018-01-10 ENCOUNTER — Other Ambulatory Visit: Payer: Self-pay

## 2018-01-10 ENCOUNTER — Other Ambulatory Visit: Payer: Self-pay | Admitting: Oncology

## 2018-01-10 DIAGNOSIS — Z170 Estrogen receptor positive status [ER+]: Principal | ICD-10-CM

## 2018-01-10 DIAGNOSIS — C50411 Malignant neoplasm of upper-outer quadrant of right female breast: Secondary | ICD-10-CM

## 2018-01-10 MED ORDER — TAMOXIFEN CITRATE 20 MG PO TABS: 20.0000 mg | ORAL_TABLET | Freq: Every day | ORAL | 0 refills | Status: DC

## 2018-01-19 ENCOUNTER — Other Ambulatory Visit: Payer: Self-pay | Admitting: Oncology

## 2018-01-22 DIAGNOSIS — D225 Melanocytic nevi of trunk: Secondary | ICD-10-CM | POA: Diagnosis not present

## 2018-01-22 DIAGNOSIS — L570 Actinic keratosis: Secondary | ICD-10-CM | POA: Diagnosis not present

## 2018-01-22 DIAGNOSIS — L814 Other melanin hyperpigmentation: Secondary | ICD-10-CM | POA: Diagnosis not present

## 2018-01-22 DIAGNOSIS — D1801 Hemangioma of skin and subcutaneous tissue: Secondary | ICD-10-CM | POA: Diagnosis not present

## 2018-01-22 DIAGNOSIS — Z86018 Personal history of other benign neoplasm: Secondary | ICD-10-CM | POA: Diagnosis not present

## 2018-02-03 ENCOUNTER — Other Ambulatory Visit: Payer: Self-pay | Admitting: Hematology and Oncology

## 2018-02-03 DIAGNOSIS — K219 Gastro-esophageal reflux disease without esophagitis: Secondary | ICD-10-CM

## 2018-02-05 ENCOUNTER — Ambulatory Visit
Admission: RE | Admit: 2018-02-05 | Discharge: 2018-02-05 | Disposition: A | Discharge status: Home / Self Care | Payer: BLUE CROSS/BLUE SHIELD | Source: Ambulatory Visit | Attending: Oncology | Admitting: Oncology

## 2018-02-05 DIAGNOSIS — R922 Inconclusive mammogram: Secondary | ICD-10-CM | POA: Diagnosis not present

## 2018-02-05 DIAGNOSIS — Z853 Personal history of malignant neoplasm of breast: Secondary | ICD-10-CM

## 2018-02-26 ENCOUNTER — Other Ambulatory Visit: Payer: Self-pay | Admitting: *Deleted

## 2018-02-26 DIAGNOSIS — Z170 Estrogen receptor positive status [ER+]: Principal | ICD-10-CM

## 2018-02-26 DIAGNOSIS — C50411 Malignant neoplasm of upper-outer quadrant of right female breast: Secondary | ICD-10-CM

## 2018-02-26 NOTE — Progress Notes (Signed)
[place]  [phone] Fax:([phone]) [phone]    ID: Bonnie Ward DOB: July 22, 1961  MR#: [id number]  UMP#:[id number]  Patient Care Team: Patient, No Pcp Per as PCP - General (General Practice) Milford Cage, OTR (Inactive) Kem Boroughs, FNP as Nurse Practitioner (Nurse Practitioner) Fanny Skates, MD as Consulting Physician (General Surgery) Magrinat, Virgie Dad, MD as Consulting Physician (Oncology) Thea Silversmith, MD as Consulting Physician (Radiation Oncology) Rockwell Germany, RN as Registered Nurse Mauro Kaufmann, RN as Registered Nurse Holley Bouche, NP as Nurse Practitioner (Nurse Practitioner) Kristeen Miss, MD as Consulting Physician (Neurosurgery) Magrinat, Virgie Dad, MD as Consulting Physician (Oncology) OTHER MD:  CHIEF COMPLAINT: Estrogen receptor positive breast cancer  CURRENT TREATMENT: tamoxifen  BREAST CANCER HISTORY: From the initial intake note:  Bonnie Ward had routine bilateral screening mammography at the [facility] 01/09/2015 showing a possible mass in the right breast. On 12/22/2014 she underwent diagnostic right mammography with tomosynthesis and right breast ultrasonography. The breast density was category C. In the right breast there was an area of increased density with architectural distortion and faint microcalcifications in the upper outer quadrant. There was mild palpable soft tissue thickening in the area in question, but no palpable right axillary lymph nodes. Ultrasound confirmed a 1.7 cm irregular hypoechoic mass. There were no abnormal-appearing right axillary lymph nodes.  Biopsy of the right breast mass in question 12/26/2014 showed ([facility] 16-08/08/2005) an invasive ductal carcinoma, grade 1 or 2, with a prognostic panel still pending.  Her subsequent history is as detailed below  INTERVAL HISTORY: Bonnie Ward returns today for follow-up and treatment of her estrogen receptor positive breast cancer.    The patient continues on tamoxifen, which she tolerates well. She endorses hot flashes and vaginal wetness.  The latter in particular is really a nuisance for her  Since her last visit here, she underwent diagnostic bilateral breast mammogram with CAD and TOMO on 02/05/2018, breast density category C, showing no evidence of malignancy.  REVIEW OF SYSTEMS: Bonnie Ward is doing well. She recently came back from a trip to [place]. She is experiencing more arthritis in her hands and increased cramping in her hands, legs and feet. She was taking magnesium, but stopped while she was on her trip. She has since started taking it again. She drinks a lot of water. Bonnie Ward reports being constipated. She tried the keto diet to try to lose weight but stopped it because she started loosing her hair. At work she stands a lot and rarely sits. The patient has decided against the shot for her bones. She has been taking CBD oil.  The patient denies unusual headaches, visual changes, nausea, vomiting, or dizziness. There has been no unusual cough, phlegm production, or pleurisy. This been no change in bowel or bladder habits. The patient denies unexplained fatigue or unexplained weight loss, bleeding, rash, or fever. A detailed review of systems was otherwise noncontributory.    PAST MEDICAL HISTORY: Past Medical History:  Diagnosis Date  . ACID REFLUX DISEASE 07/12/2010  . Anxiety   . Arachnoid cyst    left frontal lobe  . Arthritis    hands  . Breast cancer ([place]) 12/2014   right  . Complication of anesthesia 2012   states woke up during colonoscopy with Propofol  . Dental crowns present   . Difficulty swallowing pills   . Glaucoma   . Hypothyroidism   . Migraines   . Nervous stomach   . Personal history of chemotherapy 2016  .  Personal history of radiation therapy 2016   Rt breast  . Seasonal allergies   . Stress fracture of foot 12/2014   right    PAST SURGICAL HISTORY: Past Surgical History:  Procedure  Laterality Date  . BREAST LUMPECTOMY Right    2016  . COLONOSCOPY WITH PROPOFOL  10/14/2013  . ESOPHAGOGASTRODUODENOSCOPY ENDOSCOPY    . LASIK    . PORTACATH PLACEMENT Left 01/23/2015   Procedure: INSERTION PORT-A-CATH ;  Surgeon: Fanny Skates, MD;  Location: [facility];  Service: General;  Laterality: Left;  . RADIOACTIVE SEED GUIDED PARTIAL MASTECTOMY WITH AXILLARY SENTINEL LYMPH NODE BIOPSY Right 01/23/2015   Procedure: RIGHT PARTIAL MASTECTOMY WITH RADIOACTIVE SEED LOCALIZATION  WITH RIGHT AXILLARY SENTINEL LYMPH NODE BIOPSY;  Surgeon: Fanny Skates, MD;  Location: [facility];  Service: General;  Laterality: Right;  . RE-EXCISION OF BREAST LUMPECTOMY Right 02/16/2015   Procedure: RE-EXCISION OF RIGHT  BREAST LUMPECTOMY MARGINS;  Surgeon: Fanny Skates, MD;  Location: [facility];  Service: General;  Laterality: Right;  . TONSILLECTOMY  age 5  . TOOTH EXTRACTION      FAMILY HISTORY Family History  Problem Relation Age of Onset  . Osteoporosis Mother   . Irritable bowel syndrome Mother   . Hypertension Mother   . Hypertension Father   . Hyperlipidemia Father   . Diabetes Father   . Dementia Father   . Colon cancer Maternal Grandfather 79  . Breast cancer Paternal Aunt    the patient's father died at age 62 from pneumonia. [name] died at age 43 after bowel perforation. The patient had one brother, one sister. There is no history of breast or ovarian cancer in the family.  GYNECOLOGIC HISTORY:  Patient's last menstrual period was 01/25/2011. Menarche age 32, the patient is GX P0. She stopped having periods approximately 2011. She is still on hormone replacement, but is "trickling off it". She did take birth control pills for approximately 3 years remotely with no complications  SOCIAL HISTORY:  Bonnie Ward works as a Haematologist. She is divorced and lives by herself, with 2 dogs.    ADVANCED DIRECTIVES: The patient's brother  Bonnie Ward is her healthcare power of attorney. He may be reached in [place] at [place]: Social History   Tobacco Use  . Smoking status: Former Smoker    Packs/day: 0.00    Years: 0.00    Pack years: 0.00    Last attempt to quit: 02/19/2005    Years since quitting: 13.0  . Smokeless tobacco: Never Used  Substance Use Topics  . Alcohol use: Yes    Comment: occasionally  . Drug use: No     Colonoscopy: February 2016  PAP: June 2015  Bone density: 12/02/2013-- T - 2.2  Lipid panel:  Allergies  Allergen Reactions  . Lortab [Hydrocodone-Acetaminophen] Anxiety  . Milk-Related Compounds Diarrhea    GI UPSET  . Coreg [Carvedilol] Other (See Comments)    Fatigue     Current Outpatient Medications  Medication Sig Dispense Refill  . denosumab (PROLIA) 60 MG/ML SOLN injection Inject 60 mg every 6 (six) months into the skin. Administer in upper arm, thigh, or abdomen 1 Syringe 1  . Estradiol 10 MCG TABS vaginal tablet Place 1 tablet (10 mcg total) vaginally 2 (two) times a week. 24 tablet 3  . gabapentin (NEURONTIN) 300 MG capsule TAKE 1 CAPSULE(300 MG) BY MOUTH AT BEDTIME 30 capsule 3  . gabapentin (NEURONTIN) 300 MG  capsule TAKE 1 CAPSULE(300 MG) BY MOUTH AT BEDTIME 90 capsule 0  . Omega-3 Fatty Acids (FISH OIL) 1000 MG CPDR Take by mouth.    Marland Kitchen omeprazole (PRILOSEC) 40 MG capsule TAKE 1 CAPSULE(40 MG) BY MOUTH DAILY 30 capsule 0  . RESTASIS MULTIDOSE 0.05 % ophthalmic emulsion INT 1 GTT INTO OU BID  3  . tamoxifen (NOLVADEX) 20 MG tablet Take 1 tablet (20 mg total) by mouth daily. 90 tablet 0  . venlafaxine XR (EFFEXOR-XR) 37.5 MG 24 hr capsule Pt to start dose at 37.5 mg for 7 days then increase to 2 tabs daily 60 capsule 3   No current facility-administered medications for this visit.     OBJECTIVE: Middle-aged white woman appears well  Vitals:   02/27/18 1314  BP: 118/75  Pulse: 74  Resp: 18  Temp: 97.8 F (36.6 C)  SpO2: 100%     Body  mass index is 25.2 kg/m.    ECOG FS:0 - Asymptomatic  Sclerae unicteric, EOMs intact Oropharynx clear and moist No cervical or supraclavicular adenopathy Lungs no rales or rhonchi Heart regular rate and rhythm Abd soft, nontender, positive bowel sounds MSK no focal spinal tenderness, no upper extremity lymphedema Neuro: nonfocal, well oriented, appropriate affect Breasts: Right breast is status post lumpectomy and radiation.  There is no evidence of local recurrence.  The left breast is benign.  Both axillae are benign.  LAB RESULTS:  CMP     Component Value Date/Time   NA 138 02/27/2018 1301   NA 142 12/26/2016 0959   K 4.0 02/27/2018 1301   K 4.0 12/26/2016 0959   CL 100 02/27/2018 1301   CO2 29 02/27/2018 1301   CO2 27 12/26/2016 0959   GLUCOSE 119 (H) 02/27/2018 1301   GLUCOSE 107 12/26/2016 0959   BUN 20 02/27/2018 1301   BUN 10.4 12/26/2016 0959   CREATININE 0.87 02/27/2018 1301   CREATININE 0.8 12/26/2016 0959   CALCIUM 9.2 02/27/2018 1301   CALCIUM 8.8 12/26/2016 0959   PROT 7.2 02/27/2018 1301   PROT 6.8 12/26/2016 0959   ALBUMIN 4.1 02/27/2018 1301   ALBUMIN 3.7 12/26/2016 0959   AST 24 02/27/2018 1301   AST 28 12/26/2016 0959   ALT 22 02/27/2018 1301   ALT 26 12/26/2016 0959   ALKPHOS 77 02/27/2018 1301   ALKPHOS 78 12/26/2016 0959   BILITOT 0.5 02/27/2018 1301   BILITOT 0.40 12/26/2016 0959   GFRNONAA >60 02/27/2018 1301   GFRAA >60 02/27/2018 1301    INo results found for: SPEP, UPEP  Lab Results  Component Value Date   WBC 7.3 02/27/2018   NEUTROABS 4.6 02/27/2018   HGB 13.6 02/27/2018   HCT 40.3 02/27/2018   MCV 96.9 02/27/2018   PLT 244 02/27/2018      Chemistry      Component Value Date/Time   NA 138 02/27/2018 1301   NA 142 12/26/2016 0959   K 4.0 02/27/2018 1301   K 4.0 12/26/2016 0959   CL 100 02/27/2018 1301   CO2 29 02/27/2018 1301   CO2 27 12/26/2016 0959   BUN 20 02/27/2018 1301   BUN 10.4 12/26/2016 0959   CREATININE  0.87 02/27/2018 1301   CREATININE 0.8 12/26/2016 0959      Component Value Date/Time   CALCIUM 9.2 02/27/2018 1301   CALCIUM 8.8 12/26/2016 0959   ALKPHOS 77 02/27/2018 1301   ALKPHOS 78 12/26/2016 0959   AST 24 02/27/2018 1301   AST 28 12/26/2016 0959  ALT 22 02/27/2018 1301   ALT 26 12/26/2016 0959   BILITOT 0.5 02/27/2018 1301   BILITOT 0.40 12/26/2016 0959       No results found for: LABCA2  No components found for: LABCA125  No results for input(s): INR in the last 168 hours.  Urinalysis    Component Value Date/Time   COLORURINE yellow 10/05/2009 1454   APPEARANCEUR Clear 10/05/2009 1454   LABSPEC 1.010 10/05/2009 1454   PHURINE 7.0 10/05/2009 1454   HGBUR 2+ 10/05/2009 1454   BILIRUBINUR neg 04/18/2016 1433   PROTEINUR neg 04/18/2016 1433   UROBILINOGEN negative 04/18/2016 1433   UROBILINOGEN 0.2 10/05/2009 1454   NITRITE neg 04/18/2016 1433   NITRITE negative 10/05/2009 1454   LEUKOCYTESUR Trace (A) 04/18/2016 1433    STUDIES: Mm Diag Breast Tomo Bilateral  Result Date: 02/05/2018 CLINICAL DATA:  RIGHT lumpectomy in 2015. Patient was treated with radiation and chemotherapy. EXAM: DIGITAL DIAGNOSTIC BILATERAL MAMMOGRAM WITH CAD AND TOMO COMPARISON:  01/30/2017 and earlier ACR Breast Density Category c: The breast tissue is heterogeneously dense, which may obscure small masses. FINDINGS: Post operative changes are seen in the RIGHTbreast. No suspicious mass, distortion, or microcalcifications are identified to suggest presence of malignancy. Mammographic images were processed with CAD. IMPRESSION: No mammographic evidence for malignancy. RECOMMENDATION: Diagnostic mammogram is suggested in 1 year. (Code:DM-B-01Y) I have discussed the findings and recommendations with the patient. Results were also provided in writing at the conclusion of the visit. If applicable, a reminder letter will be sent to the patient regarding the next appointment. BI-RADS CATEGORY  2:  Benign. Electronically Signed   By: Nolon Nations M.D.   On: 02/05/2018 16:09    ASSESSMENT: 57 y.o. [place] woman status post right breastUpper outer quadrant biopsy 12/26/2014 for a clinical T1 cN0, stage IA invasive ductal carcinoma, grade 1 or 2, estrogen and progesterone receptor positive, with an MIB-1 of 11%, and HER-2 amplified, with a signals ratio of 2.07, number per cell 2.28.  (1) status post right lumpectomy and sentinel lymph node sampling 01/23/2015 for a pT1c pN1, stage IIA invasive ductal carcinoma, grade 1, with focally positive anterior margins  (a) additional surgery 02/16/2015 found residual disease but cleared margins  (2) chemotherapy started 03/05/2015, consisting of doxorubicin and cyclophosphamide in dose dense fashion 4, completed 04/16/2015, followed by paclitaxel weekly x 12 planned cycles, started 04/30/2015,  with trastuzumab and pertuzumab given every 3 weeks.  (a) Paclitaxel stopped after 4 cycles because of progressive neuropathy, last dose 05/28/2015  (b) pertuzumab stopped after 2 cycles (when paclitaxel stopped)  (3) trastuzumab was continued to complete 1 year-- final dose 05/23/2016  (a) echocardiogram 08/19/2016 shows a >10% drop-- trastuzumab held after 07/30/2015 dose  (b) repeat echocardiogram 10/12/2015 shows EF recovery to 55%, trastuzumab resumed 10/22/2015  (c) echocardiogram 03/07/2016 shows a stable ejection fraction  (d) echocardiogram 06/20/2016 shows an ejection fraction of 55%   (4) adjuvant radiation 07/06/2015-08/21/2015::  Right breast / 45 Gray @ 1.8 Pearline Cables per fraction x 25 fractions Right supraclavicular fossa/PAB 45 Gy '@1' .8 Gy per fraction x 25 fractions Right breast boost / 16 Gray at [employer] per fraction x 8 fractions  (5) started tamoxifen 09/10/2015  PLAN: Zyia is now yet just over 3 years out from definitive surgery for breast cancer with no evidence of disease recurrence.  This is very favorable.  She continues on  tamoxifen generally with good tolerance.  The alternatives are going back to an aromatase inhibitor which will make her  osteoarthritis in the hands worse, or going to [facility] which involves coming here every month to get to intramuscular shots.  She really is not interested in pursuing that at present so the plan continues to be to stay on tamoxifen until she completes a total of 5 years  I do not think her cramps are going to be related to tamoxifen since they disappeared when she was on vacation on [place].  I do think they are going to be related to her standing all day at work.  She could possibly get a stool told enough that she could work while sitting, that might help.  Otherwise she could increase the magnesium she is currently taking for this problem.  She knows to call for any other issues that may develop before the next visit.  Magrinat, Virgie Dad, MD  02/27/18 1:41 PM Medical Oncology and Hematology [facility] [address] [place], [place] [address] Tel. [phone]    Fax. [phone]  I, Margit Banda am acting as a scribe for Chauncey Cruel, MD.   I, Lurline Del MD, have reviewed the above documentation for accuracy and completeness, and I agree with the above.

## 2018-02-27 ENCOUNTER — Inpatient Hospital Stay: Payer: BLUE CROSS/BLUE SHIELD

## 2018-02-27 ENCOUNTER — Inpatient Hospital Stay: Payer: BLUE CROSS/BLUE SHIELD | Attending: Oncology | Admitting: Oncology

## 2018-02-27 VITALS — BP 118/75 | HR 74 | Temp 97.8°F | Resp 18 | Ht 66.0 in | Wt 156.1 lb

## 2018-02-27 DIAGNOSIS — Z170 Estrogen receptor positive status [ER+]: Secondary | ICD-10-CM | POA: Diagnosis not present

## 2018-02-27 DIAGNOSIS — C50411 Malignant neoplasm of upper-outer quadrant of right female breast: Secondary | ICD-10-CM

## 2018-02-27 LAB — CMP (CANCER CENTER ONLY)
ALBUMIN: 4.1 g/dL (ref 3.5–5.0)
ALK PHOS: 77 U/L (ref 38–126)
ALT: 22 U/L (ref 0–44)
ANION GAP: 9 (ref 5–15)
AST: 24 U/L (ref 15–41)
BUN: 20 mg/dL (ref 6–20)
CALCIUM: 9.2 mg/dL (ref 8.9–10.3)
CHLORIDE: 100 mmol/L (ref 98–111)
CO2: 29 mmol/L (ref 22–32)
Creatinine: 0.87 mg/dL (ref 0.44–1.00)
GFR, Est AFR Am: >60 mL/min (ref >60)
GFR, Estimated: >60 mL/min (ref >60)
GLUCOSE: 119 mg/dL — AB (ref 70–99)
Potassium: 4 mmol/L (ref 3.5–5.1)
SODIUM: 138 mmol/L (ref 135–145)
Total Bilirubin: 0.5 mg/dL (ref 0.3–1.2)
Total Protein: 7.2 g/dL (ref 6.5–8.1)

## 2018-02-27 LAB — CBC WITH DIFFERENTIAL (CANCER CENTER ONLY)
BASOS ABS: 0 10*3/uL (ref 0.0–0.1)
Basophils Relative: 0 %
EOS ABS: 0.1 10*3/uL (ref 0.0–0.5)
Eosinophils Relative: 1 %
HCT: 40.3 % (ref 34.8–46.6)
HEMOGLOBIN: 13.6 g/dL (ref 11.6–15.9)
LYMPHS ABS: 2.1 10*3/uL (ref 0.9–3.3)
LYMPHS PCT: 29 %
MCH: 32.7 pg (ref 25.1–34.0)
MCHC: 33.7 g/dL (ref 31.5–36.0)
MCV: 96.9 fL (ref 79.5–101.0)
MONO ABS: 0.5 10*3/uL (ref 0.1–0.9)
MONOS PCT: 7 %
NEUTROS ABS: 4.6 10*3/uL (ref 1.5–6.5)
Neutrophils Relative %: 63 %
PLATELETS: 244 10*3/uL (ref 145–400)
RBC: 4.16 MIL/uL (ref 3.70–5.45)
RDW: 14.2 % (ref 11.2–14.5)
WBC Count: 7.3 10*3/uL (ref 3.9–10.3)

## 2018-02-27 MED ORDER — TAMOXIFEN CITRATE 20 MG PO TABS: 20.0000 mg | ORAL_TABLET | Freq: Every day | ORAL | 0 refills | Status: DC

## 2018-02-27 NOTE — Addendum Note (Signed)
Addended by: Chauncey Cruel on: 02/27/2018 02:03 PM   Modules accepted: Orders

## 2018-03-01 ENCOUNTER — Telehealth: Payer: Self-pay | Admitting: Oncology

## 2018-03-01 NOTE — Telephone Encounter (Signed)
Per 7/9 los, scheduled July 2020 appt.  Called patient and left message.  Mailed calendar.

## 2018-03-07 ENCOUNTER — Other Ambulatory Visit: Payer: Self-pay | Admitting: Hematology and Oncology

## 2018-03-07 DIAGNOSIS — K219 Gastro-esophageal reflux disease without esophagitis: Secondary | ICD-10-CM

## 2018-03-08 DIAGNOSIS — H401123 Primary open-angle glaucoma, left eye, severe stage: Secondary | ICD-10-CM | POA: Diagnosis not present

## 2018-03-08 DIAGNOSIS — H524 Presbyopia: Secondary | ICD-10-CM | POA: Diagnosis not present

## 2018-03-08 DIAGNOSIS — H01002 Unspecified blepharitis right lower eyelid: Secondary | ICD-10-CM | POA: Diagnosis not present

## 2018-03-08 DIAGNOSIS — H01004 Unspecified blepharitis left upper eyelid: Secondary | ICD-10-CM | POA: Diagnosis not present

## 2018-03-08 DIAGNOSIS — H01001 Unspecified blepharitis right upper eyelid: Secondary | ICD-10-CM | POA: Diagnosis not present

## 2018-03-08 DIAGNOSIS — H01005 Unspecified blepharitis left lower eyelid: Secondary | ICD-10-CM | POA: Diagnosis not present

## 2018-04-06 ENCOUNTER — Other Ambulatory Visit: Payer: Self-pay

## 2018-04-06 ENCOUNTER — Other Ambulatory Visit: Payer: Self-pay | Admitting: Hematology and Oncology

## 2018-04-06 DIAGNOSIS — K219 Gastro-esophageal reflux disease without esophagitis: Secondary | ICD-10-CM

## 2018-04-06 MED ORDER — OMEPRAZOLE 40 MG PO CPDR: DELAYED_RELEASE_CAPSULE | ORAL | 0 refills | Status: DC

## 2018-04-19 ENCOUNTER — Other Ambulatory Visit: Payer: Self-pay | Admitting: Oncology

## 2018-05-02 NOTE — Progress Notes (Signed)
57 y.o. G0P0000 Divorced White or Caucasian Not Hispanic or Latino female here for annual exam.   H/O ER + breast cancer, s/p right lumpectomy, s/p chemo and radiation, on tamoxifen.  She c/o hair loss, weight gain, constipation, and fatigue. She was previously on synthroid, off for several years.  She has a h/o Osteoporosis, was going to start Prolia, but never did. Next DEXA is due in 6/20. H/O vit d def, not on any vit D.  Not currently sexually active, but she has started dating someone. She is using vaginal estrogen, Oncology is aware. No current c/o.     Patient's last menstrual period was 01/25/2011.          Sexually active: No.  The current method of family planning is post menopausal status.    Exercising: Yes.    TRX/Orange theory/ walking Smoker:  Former smoker  Health Maintenance: Pap:  05/01/2017 WNL NEG HR HPV 04-18-16 WNL NEG HR HPV  History of abnormal Pap:  Yes, history of positive hpv 2016 MMG:  02/05/2018 BI-RADS CATEGORY  2: Benign. BMD:   01/30/2017 osteoporosis  Colonoscopy: 10/14/2013 repeat in 5 years, history of polyps.  Done at [facility]:  2013 Gardasil: N/A   reports that she quit smoking about 13 years ago. She smoked 0.00 packs per day for 0.00 years. She has never used smokeless tobacco. She reports that she drinks alcohol. She reports that she does not use drugs. She is a hair stylist. 4-5 drinks a week.   Past Medical History:  Diagnosis Date  . ACID REFLUX DISEASE 07/12/2010  . Anxiety   . Arachnoid cyst    left frontal lobe  . Arthritis    hands  . Breast cancer ([facility]) 12/2014   right  . Complication of anesthesia 2012   states woke up during colonoscopy with Propofol  . Dental crowns present   . Difficulty swallowing pills   . Glaucoma   . Hypothyroidism   . Migraines   . Nervous stomach   . Personal history of chemotherapy 2016  . Personal history of radiation therapy 2016   Rt breast  . Seasonal allergies   . Stress fracture of  foot 12/2014   right    Past Surgical History:  Procedure Laterality Date  . BREAST LUMPECTOMY Right    2016  . COLONOSCOPY WITH PROPOFOL  10/14/2013  . ESOPHAGOGASTRODUODENOSCOPY ENDOSCOPY    . LASIK    . PORTACATH PLACEMENT Left 01/23/2015   Procedure: INSERTION PORT-A-CATH ;  Surgeon: Fanny Skates, MD;  Location: [facility];  Service: General;  Laterality: Left;  . RADIOACTIVE SEED GUIDED PARTIAL MASTECTOMY WITH AXILLARY SENTINEL LYMPH NODE BIOPSY Right 01/23/2015   Procedure: RIGHT PARTIAL MASTECTOMY WITH RADIOACTIVE SEED LOCALIZATION  WITH RIGHT AXILLARY SENTINEL LYMPH NODE BIOPSY;  Surgeon: Fanny Skates, MD;  Location: [facility];  Service: General;  Laterality: Right;  . RE-EXCISION OF BREAST LUMPECTOMY Right 02/16/2015   Procedure: RE-EXCISION OF RIGHT  BREAST LUMPECTOMY MARGINS;  Surgeon: Fanny Skates, MD;  Location: [facility];  Service: General;  Laterality: Right;  . TONSILLECTOMY  age 66  . TOOTH EXTRACTION      Current Outpatient Medications  Medication Sig Dispense Refill  . Estradiol 10 MCG TABS vaginal tablet Place 1 tablet (10 mcg total) vaginally 2 (two) times a week. 24 tablet 3  . gabapentin (NEURONTIN) 300 MG capsule TAKE 1 CAPSULE(300 MG) BY MOUTH AT BEDTIME 90 capsule 0  . latanoprost (  XALATAN) 0.005 % ophthalmic solution 1 drop at bedtime.    . Omega-3 Fatty Acids (FISH OIL) 1000 MG CPDR Take by mouth.    Marland Kitchen omeprazole (PRILOSEC) 40 MG capsule TAKE 1 CAPSULE(40 MG) BY MOUTH DAILY 30 capsule 0  . RESTASIS MULTIDOSE 0.05 % ophthalmic emulsion INT 1 GTT INTO OU BID  3  . tamoxifen (NOLVADEX) 20 MG tablet Take 1 tablet (20 mg total) by mouth daily. 90 tablet 0   No current facility-administered medications for this visit.     Family History  Problem Relation Age of Onset  . Osteoporosis Mother   . Irritable bowel syndrome Mother   . Hypertension Mother   . Hypertension Father   . Hyperlipidemia Father   . Diabetes  Father   . Dementia Father   . Colon cancer Maternal Grandfather 70  . Breast cancer Paternal Aunt     Review of Systems  Constitutional: Negative.   HENT: Negative.   Eyes: Negative.   Respiratory: Negative.   Cardiovascular: Negative.   Gastrointestinal: Positive for constipation.  Endocrine: Negative.   Genitourinary: Negative.   Musculoskeletal: Negative.   Skin:       Hair loss  Allergic/Immunologic: Negative.   Neurological: Negative.   Hematological: Negative.   Psychiatric/Behavioral: Negative.   All other systems reviewed and are negative.   Exam:   BP 100/80 (BP Location: Right Arm, Patient Position: Sitting, Cuff Size: Normal)   Pulse 80   Resp 16   Ht 5' 6.25" (1.683 m)   Wt 155 lb (70.3 kg)   LMP 01/25/2011   BMI 24.83 kg/m   Weight change: @WEIGHTCHANGE @ Height:   Height: 5' 6.25" (168.3 cm)  Ht Readings from Last 3 Encounters:  05/07/18 5' 6.25" (1.683 m)  02/27/18 5\' 6"  (1.676 m)  07/24/17 5\' 6"  (1.676 m)    General appearance: alert, cooperative and appears stated age Head: Normocephalic, without obvious abnormality, atraumatic Neck: no adenopathy, supple, symmetrical, trachea midline and thyroid normal to inspection and palpation Lungs: clear to auscultation bilaterally Cardiovascular: regular rate and rhythm Breasts: normal appearance, no masses or tenderness, evidence of right lumpectomy Abdomen: soft, non-tender; non distended,  no masses,  no organomegaly Extremities: extremities normal, atraumatic, no cyanosis or edema Skin: Skin color, texture, turgor normal. No rashes or lesions Lymph nodes: Cervical, supraclavicular, and axillary nodes normal. No abnormal inguinal nodes palpated Neurologic: Grossly normal   Pelvic: External genitalia:  no lesions, mildly atrophic              Urethra:  normal appearing urethra with no masses, tenderness or lesions              Bartholins and Skenes: normal                 Vagina: atrophic appearing  vagina with normal color and discharge, no lesions. Able to insert one finger vaginally, only able to insert 2 fingers a few cm prior to discomfort.               Cervix: no lesions               Bimanual Exam:  Uterus:  normal size, contour, position, consistency, mobility, non-tender              Adnexa: no mass, fullness, tenderness               Rectovaginal: Confirms               Anus:  normal sphincter tone, no lesions  Chaperone was present for exam.  A:  Well Woman with normal exam  H/o breast cancer, on tamoxifen  Vaginal atrophy, improved with vaginal estrogen, but the vagina is tight. She is thinking about being sexually active again  Constipation, hair loss, fatigue  Family history of DM  H/O osteoporosis  P:   No pap this year  Screening labs, TFT's, HgbA1C  Mammogram UTD  Colonoscopy due in 2/20  DEXA due in 6/20, order placed  Continue vaginal estrogen (Oncology aware)  Recommended she try vaginal dilators  Call with any concerns

## 2018-05-07 ENCOUNTER — Encounter: Payer: Self-pay | Admitting: Obstetrics and Gynecology

## 2018-05-07 ENCOUNTER — Ambulatory Visit: Payer: BLUE CROSS/BLUE SHIELD | Admitting: Obstetrics and Gynecology

## 2018-05-07 ENCOUNTER — Other Ambulatory Visit: Payer: Self-pay

## 2018-05-07 VITALS — BP 100/80 | HR 80 | Resp 16 | Ht 66.25 in | Wt 155.0 lb

## 2018-05-07 DIAGNOSIS — R635 Abnormal weight gain: Secondary | ICD-10-CM | POA: Diagnosis not present

## 2018-05-07 DIAGNOSIS — Z853 Personal history of malignant neoplasm of breast: Secondary | ICD-10-CM

## 2018-05-07 DIAGNOSIS — Z01419 Encounter for gynecological examination (general) (routine) without abnormal findings: Secondary | ICD-10-CM

## 2018-05-07 DIAGNOSIS — K5900 Constipation, unspecified: Secondary | ICD-10-CM | POA: Diagnosis not present

## 2018-05-07 DIAGNOSIS — M810 Age-related osteoporosis without current pathological fracture: Secondary | ICD-10-CM

## 2018-05-07 DIAGNOSIS — Z0000 Encounter for general adult medical examination without abnormal findings: Secondary | ICD-10-CM

## 2018-05-07 DIAGNOSIS — N952 Postmenopausal atrophic vaginitis: Secondary | ICD-10-CM | POA: Diagnosis not present

## 2018-05-07 DIAGNOSIS — L659 Nonscarring hair loss, unspecified: Secondary | ICD-10-CM

## 2018-05-07 DIAGNOSIS — E559 Vitamin D deficiency, unspecified: Secondary | ICD-10-CM

## 2018-05-07 DIAGNOSIS — Z833 Family history of diabetes mellitus: Secondary | ICD-10-CM

## 2018-05-07 DIAGNOSIS — E2839 Other primary ovarian failure: Secondary | ICD-10-CM

## 2018-05-07 MED ORDER — ESTRADIOL 10 MCG VA TABS: 1.0000 | ORAL_TABLET | VAGINAL | 3 refills | Status: DC

## 2018-05-07 NOTE — Patient Instructions (Addendum)
Vaginal dilators  Schedule your DEXA (bone density)r for 6/20  EXERCISE AND DIET:  We recommended that you start or continue a regular exercise program for good health. Regular exercise means any activity that makes your heart beat faster and makes you sweat.  We recommend exercising at least 30 minutes per day at least 3 days a week, preferably 4 or 5.  We also recommend a diet low in fat and sugar.  Inactivity, poor dietary choices and obesity can cause diabetes, heart attack, stroke, and kidney damage, among others.    ALCOHOL AND SMOKING:  Women should limit their alcohol intake to no more than 7 drinks/beers/glasses of wine (combined, not each!) per week. Moderation of alcohol intake to this level decreases your risk of breast cancer and liver damage. And of course, no recreational drugs are part of a healthy lifestyle.  And absolutely no smoking or even second hand smoke. Most people know smoking can cause heart and lung diseases, but did you know it also contributes to weakening of your bones? Aging of your skin?  Yellowing of your teeth and nails?  CALCIUM AND VITAMIN D:  Adequate intake of calcium and Vitamin D are recommended.  The recommendations for exact amounts of these supplements seem to change often, but generally speaking 1,200 mg of calcium (either carbonate or citrate) and 800 units of Vitamin D per day seems prudent. Certain women may benefit from higher intake of Vitamin D.  If you are among these women, your doctor will have told you during your visit.    PAP SMEARS:  Pap smears, to check for cervical cancer or precancers,  have traditionally been done yearly, although recent scientific advances have shown that most women can have pap smears less often.  However, every woman still should have a physical exam from her gynecologist every year. It will include a breast check, inspection of the vulva and vagina to check for abnormal growths or skin changes, a visual exam of the cervix,  and then an exam to evaluate the size and shape of the uterus and ovaries.  And after 57 years of age, a rectal exam is indicated to check for rectal cancers. We will also provide age appropriate advice regarding health maintenance, like when you should have certain vaccines, screening for sexually transmitted diseases, bone density testing, colonoscopy, mammograms, etc.   MAMMOGRAMS:  All women over 57 years old should have a yearly mammogram. Many facilities now offer a "3D" mammogram, which may cost around $50 extra out of pocket. If possible,  we recommend you accept the option to have the 3D mammogram performed.  It both reduces the number of women who will be called back for extra views which then turn out to be normal, and it is better than the routine mammogram at detecting truly abnormal areas.    COLONOSCOPY:  Colonoscopy to screen for colon cancer is recommended for all women at age 57.  We know, you hate the idea of the prep.  We agree, BUT, having colon cancer and not knowing it is worse!!  Colon cancer so often starts as a polyp that can be seen and removed at colonscopy, which can quite literally save your life!  And if your first colonoscopy is normal and you have no family history of colon cancer, most women don't have to have it again for 10 years.  Once every ten years, you can do something that may end up saving your life, right?  We will be  happy to help you get it scheduled when you are ready.  Be sure to check your insurance coverage so you understand how much it will cost.  It may be covered as a preventative service at no cost, but you should check your particular policy.

## 2018-05-08 ENCOUNTER — Other Ambulatory Visit: Payer: Self-pay | Admitting: Oncology

## 2018-05-08 DIAGNOSIS — K219 Gastro-esophageal reflux disease without esophagitis: Secondary | ICD-10-CM

## 2018-05-08 LAB — COMPREHENSIVE METABOLIC PANEL
ALBUMIN: 4.2 g/dL (ref 3.5–5.5)
ALK PHOS: 64 IU/L (ref 39–117)
ALT: 16 IU/L (ref 0–32)
AST: 24 IU/L (ref 0–40)
Albumin/Globulin Ratio: 1.9 (ref 1.2–2.2)
BUN / CREAT RATIO: 19 (ref 9–23)
BUN: 15 mg/dL (ref 6–24)
Bilirubin Total: 0.4 mg/dL (ref 0.0–1.2)
CO2: 25 mmol/L (ref 20–29)
CREATININE: 0.78 mg/dL (ref 0.57–1.00)
Calcium: 8.8 mg/dL (ref 8.7–10.2)
Chloride: 101 mmol/L (ref 96–106)
GFR calc Af Amer: 98 mL/min/{1.73_m2} (ref >59)
GFR calc non Af Amer: 85 mL/min/{1.73_m2} (ref >59)
GLUCOSE: 93 mg/dL (ref 65–99)
Globulin, Total: 2.2 g/dL (ref 1.5–4.5)
Potassium: 4.3 mmol/L (ref 3.5–5.2)
Sodium: 141 mmol/L (ref 134–144)
TOTAL PROTEIN: 6.4 g/dL (ref 6.0–8.5)

## 2018-05-08 LAB — LIPID PANEL
CHOLESTEROL TOTAL: 178 mg/dL (ref 100–199)
Chol/HDL Ratio: 2.7 ratio (ref 0.0–4.4)
HDL: 66 mg/dL (ref >39)
LDL Calculated: 87 mg/dL (ref 0–99)
Triglycerides: 126 mg/dL (ref 0–149)
VLDL CHOLESTEROL CAL: 25 mg/dL (ref 5–40)

## 2018-05-08 LAB — CBC
HEMOGLOBIN: 12.7 g/dL (ref 11.1–15.9)
Hematocrit: 37.6 % (ref 34.0–46.6)
MCH: 32 pg (ref 26.6–33.0)
MCHC: 33.8 g/dL (ref 31.5–35.7)
MCV: 95 fL (ref 79–97)
PLATELETS: 272 10*3/uL (ref 150–450)
RBC: 3.97 x10E6/uL (ref 3.77–5.28)
RDW: 13.9 % (ref 12.3–15.4)
WBC: 7.1 10*3/uL (ref 3.4–10.8)

## 2018-05-08 LAB — THYROID PANEL WITH TSH
FREE THYROXINE INDEX: 1.5 (ref 1.2–4.9)
T3 UPTAKE RATIO: 20 % — AB (ref 24–39)
T4, Total: 7.4 ug/dL (ref 4.5–12.0)
TSH: 1.04 u[IU]/mL (ref 0.450–4.500)

## 2018-05-08 LAB — VITAMIN D 25 HYDROXY (VIT D DEFICIENCY, FRACTURES): VIT D 25 HYDROXY: 30 ng/mL (ref 30.0–100.0)

## 2018-05-08 LAB — HEMOGLOBIN A1C
ESTIMATED AVERAGE GLUCOSE: 120 mg/dL
HEMOGLOBIN A1C: 5.8 % — AB (ref 4.8–5.6)

## 2018-05-11 ENCOUNTER — Telehealth: Payer: Self-pay | Admitting: *Deleted

## 2018-05-11 DIAGNOSIS — R7303 Prediabetes: Secondary | ICD-10-CM

## 2018-05-11 NOTE — Telephone Encounter (Signed)
Notes recorded by Burnice Logan, RN on 05/11/2018 at 12:22 PM EDT Left message to call Sharee Pimple at [phone].

## 2018-05-11 NOTE — Telephone Encounter (Signed)
-----   Message from Salvadore Dom, MD sent at 05/11/2018 11:09 AM EDT ----- Please let the patient know that she has pre-diabetes and set her up at the [facility] clinic for pre-diabetes.  The rest of her lab work was fine, including TFT's, the low T3 uptake isn't significant with the other normal values. Her vit d is just in the normal range. I would recommend that she supplement with an additional 400 IU of vit D3 daily.

## 2018-05-11 NOTE — Telephone Encounter (Signed)
Spoke with patient, advised as seen below per Dr. Nelson Chimes. Patient agreeable to proceed with referral to [facility], order placed. Advised patient our referral coordinator will f/u with scheduling.  Patient verbalizes understanding and is agreeable.   Routing to [employer] .  Encounter closed.

## 2018-06-06 ENCOUNTER — Other Ambulatory Visit: Payer: Self-pay | Admitting: Oncology

## 2018-06-06 DIAGNOSIS — K219 Gastro-esophageal reflux disease without esophagitis: Secondary | ICD-10-CM

## 2018-07-04 ENCOUNTER — Telehealth: Payer: Self-pay | Admitting: Obstetrics and Gynecology

## 2018-07-04 NOTE — Telephone Encounter (Signed)
Left message to call Sharee Pimple, RN at [place].    If triage unavailable, please schedule OV  for 07/05/18 at 8:15am with Dr. Talbert Nan.

## 2018-07-04 NOTE — Telephone Encounter (Signed)
Patient is having uti symptoms. Can nurse please help with scheduling because patient is available Thursday morning?

## 2018-07-04 NOTE — Telephone Encounter (Signed)
Spoke with patient. OV scheduled for 11/14 at 8:15am with Dr. Talbert Nan.   Routing to provider for final review. Patient is agreeable to disposition. Will close encounter.

## 2018-07-04 NOTE — Telephone Encounter (Signed)
Spoke patient. Patient reports dysuria, started 2 days ago. Patient states she just recently became SA again, experienced reccurrent UTIs in the past with intercourse. Patient would like to discuss preventative tx.   Denies lower back pain, fever/chills, N/V, urinary frequency or urgency.   Advised patient will need OV for further evaluation. Patient request OV for 11/14. Advised I will need to review schedule with provider and return call, patient agreeable.

## 2018-07-05 ENCOUNTER — Encounter: Payer: Self-pay | Admitting: Obstetrics and Gynecology

## 2018-07-05 ENCOUNTER — Ambulatory Visit: Payer: BLUE CROSS/BLUE SHIELD | Admitting: Obstetrics and Gynecology

## 2018-07-05 ENCOUNTER — Other Ambulatory Visit: Payer: Self-pay

## 2018-07-05 VITALS — BP 120/82 | HR 68 | Temp 98.2°F | Wt 159.0 lb

## 2018-07-05 DIAGNOSIS — Z87440 Personal history of urinary (tract) infections: Secondary | ICD-10-CM

## 2018-07-05 DIAGNOSIS — N952 Postmenopausal atrophic vaginitis: Secondary | ICD-10-CM

## 2018-07-05 DIAGNOSIS — N3090 Cystitis, unspecified without hematuria: Secondary | ICD-10-CM | POA: Diagnosis not present

## 2018-07-05 DIAGNOSIS — N941 Unspecified dyspareunia: Secondary | ICD-10-CM | POA: Diagnosis not present

## 2018-07-05 LAB — POCT URINALYSIS DIPSTICK
BILIRUBIN UA: NEGATIVE
Blood, UA: POSITIVE
Glucose, UA: NEGATIVE
KETONES UA: NEGATIVE
NITRITE UA: NEGATIVE
PH UA: 5 (ref 5.0–8.0)
PROTEIN UA: NEGATIVE
Spec Grav, UA: 1.01 (ref 1.010–1.025)
UROBILINOGEN UA: 0.2 U/dL

## 2018-07-05 MED ORDER — PHENAZOPYRIDINE HCL 200 MG PO TABS: 200.0000 mg | ORAL_TABLET | Freq: Three times a day (TID) | ORAL | 0 refills | Status: DC | PRN

## 2018-07-05 MED ORDER — NITROFURANTOIN MACROCRYSTAL 50 MG PO CAPS: ORAL_CAPSULE | ORAL | 2 refills | Status: DC

## 2018-07-05 MED ORDER — SULFAMETHOXAZOLE-TRIMETHOPRIM 800-160 MG PO TABS: 1.0000 | ORAL_TABLET | Freq: Two times a day (BID) | ORAL | 0 refills | Status: DC

## 2018-07-05 NOTE — Patient Instructions (Signed)

## 2018-07-05 NOTE — Progress Notes (Signed)
yelowGYNECOLOGY  VISIT   HPI: 57 y.o.   Divorced White or Caucasian Not Hispanic or Latino  female   [place] with Patient's last menstrual period was 01/25/2011.   here for UTI symptoms that began 2 days ago. Have worsened this morning. Denies any fever or chills.   She was sexually active for the first time this weekend in years. She started having symptoms of a UTI 2 days. She c/o frequency, burning, dysuria. No fever or flank pain.  She has been using her vaginal estrogen, has vaginal dilators, needs to use them. Wasn't able to complete intercourse the other night. Previously when married she was on suppression for UTI's with intercourse.   GYNECOLOGIC HISTORY: Patient's last menstrual period was 01/25/2011. Contraception: Postmenopausal Menopausal hormone therapy:  Estradiol vaginal tablet       OB History    Gravida  0   Para  0   Term  0   Preterm  0   AB  0   Living  0     SAB  0   TAB  0   Ectopic  0   Multiple  0   Live Births  0              Patient Active Problem List   Diagnosis Date Noted  . Chest pain 03/07/2016  . CHF NYHA class I ([place]) 09/10/2015  . Chemotherapy adverse reaction 09/01/2015  . Hot flashes 06/04/2015  . Chemotherapy-induced neuropathy ([place]) 05/28/2015  . Fatigue 05/08/2015  . Rash 04/23/2015  . Antineoplastic chemotherapy induced anemia 04/23/2015  . Insomnia 04/23/2015  . Postmenopausal hormone replacement therapy 04/13/2015  . Drug rash 04/10/2015  . Mucositis due to chemotherapy 03/27/2015  . Constipation 03/13/2015  . Heartburn 03/13/2015  . Malignant neoplasm of upper-outer quadrant of right breast in female, estrogen receptor positive ([place]) 12/31/2014  . Hormone replacement therapy (postmenopausal) 10/18/2010  . Arachnoid cyst   . ACID REFLUX DISEASE 07/12/2010  . DYSPHAGIA UNSPECIFIED 07/12/2010  . WRIST PAIN, RIGHT 04/22/2010  . STRESS FRACTURE OF THE METATARSALS 04/22/2010  . BACK PAIN 10/05/2009  . UTI'S,  RECURRENT 03/10/2009  . GLAUCOMA, LEFT EYE 01/13/2009  . ALLERGIC RHINITIS 01/13/2009  . HEADACHE 01/13/2009    Past Medical History:  Diagnosis Date  . ACID REFLUX DISEASE 07/12/2010  . Anxiety   . Arachnoid cyst    left frontal lobe  . Arthritis    hands  . Breast cancer ([place]) 12/2014   right  . Complication of anesthesia 2012   states woke up during colonoscopy with Propofol  . Dental crowns present   . Difficulty swallowing pills   . Glaucoma   . Hypothyroidism   . Migraines   . Nervous stomach   . Personal history of chemotherapy 2016  . Personal history of radiation therapy 2016   Rt breast  . Seasonal allergies   . Stress fracture of foot 12/2014   right    Past Surgical History:  Procedure Laterality Date  . BREAST LUMPECTOMY Right    2016  . COLONOSCOPY WITH PROPOFOL  10/14/2013  . ESOPHAGOGASTRODUODENOSCOPY ENDOSCOPY    . LASIK    . PORTACATH PLACEMENT Left 01/23/2015   Procedure: INSERTION PORT-A-CATH ;  Surgeon: Fanny Skates, MD;  Location: [place];  Service: General;  Laterality: Left;  . RADIOACTIVE SEED GUIDED PARTIAL MASTECTOMY WITH AXILLARY SENTINEL LYMPH NODE BIOPSY Right 01/23/2015   Procedure: RIGHT PARTIAL MASTECTOMY WITH RADIOACTIVE SEED LOCALIZATION  WITH RIGHT AXILLARY SENTINEL  LYMPH NODE BIOPSY;  Surgeon: Fanny Skates, MD;  Location: [place];  Service: General;  Laterality: Right;  . RE-EXCISION OF BREAST LUMPECTOMY Right 02/16/2015   Procedure: RE-EXCISION OF RIGHT  BREAST LUMPECTOMY MARGINS;  Surgeon: Fanny Skates, MD;  Location: [place];  Service: General;  Laterality: Right;  . TONSILLECTOMY  age 57  . TOOTH EXTRACTION      Current Outpatient Medications  Medication Sig Dispense Refill  . Estradiol 10 MCG TABS vaginal tablet Place 1 tablet (10 mcg total) vaginally 2 (two) times a week. 24 tablet 3  . gabapentin (NEURONTIN) 300 MG capsule TAKE 1 CAPSULE(300 MG) BY MOUTH AT BEDTIME 90  capsule 0  . latanoprost (XALATAN) 0.005 % ophthalmic solution 1 drop at bedtime.    . Omega-3 Fatty Acids (FISH OIL) 1000 MG CPDR Take by mouth.    Marland Kitchen omeprazole (PRILOSEC) 40 MG capsule TAKE 1 CAPSULE(40 MG) BY MOUTH DAILY 30 capsule 0  . RESTASIS MULTIDOSE 0.05 % ophthalmic emulsion INT 1 GTT INTO OU BID  3  . tamoxifen (NOLVADEX) 20 MG tablet Take 1 tablet (20 mg total) by mouth daily. 90 tablet 0   No current facility-administered medications for this visit.      ALLERGIES: Lortab [hydrocodone-acetaminophen]; Milk-related compounds; and Coreg [carvedilol]  Family History  Problem Relation Age of Onset  . Osteoporosis Mother   . Irritable bowel syndrome Mother   . Hypertension Mother   . Hypertension Father   . Hyperlipidemia Father   . Diabetes Father   . Dementia Father   . Colon cancer Maternal Grandfather 86  . Breast cancer Paternal Aunt     Social History   Socioeconomic History  . Marital status: Divorced    Spouse name: Not on file  . Number of children: Not on file  . Years of education: Not on file  . Highest education level: Not on file  Occupational History  . Not on file  Social Needs  . Financial resource strain: Not on file  . Food insecurity:    Worry: Not on file    Inability: Not on file  . Transportation needs:    Medical: Not on file    Non-medical: Not on file  Tobacco Use  . Smoking status: Former Smoker    Packs/day: 0.00    Years: 0.00    Pack years: 0.00    Last attempt to quit: 02/19/2005    Years since quitting: 13.3  . Smokeless tobacco: Never Used  Substance and Sexual Activity  . Alcohol use: Yes    Comment: occasionally  . Drug use: No  . Sexual activity: Not Currently    Partners: Male    Birth control/protection: Post-menopausal  Lifestyle  . Physical activity:    Days per week: Not on file    Minutes per session: Not on file  . Stress: Not on file  Relationships  . Social connections:    Talks on phone: Not on file     Gets together: Not on file    Attends religious service: Not on file    Active member of club or organization: Not on file    Attends meetings of clubs or organizations: Not on file    Relationship status: Not on file  . Intimate partner violence:    Fear of current or ex partner: Not on file    Emotionally abused: Not on file    Physically abused: Not on file    Forced sexual  activity: Not on file  Other Topics Concern  . Not on file  Social History Narrative   Hairstylist    Hh of 2    Former smoker   Husband smokes outside    Review of Systems  Constitutional: Negative.   HENT: Negative.   Eyes: Negative.   Respiratory: Negative.   Cardiovascular: Negative.   Gastrointestinal: Negative.   Genitourinary: Positive for dysuria, frequency and urgency.  Musculoskeletal: Negative.   Skin: Negative.   Neurological: Negative.   Endo/Heme/Allergies: Negative.   Psychiatric/Behavioral: Negative.     PHYSICAL EXAMINATION:    BP 120/82 (BP Location: Left Arm, Patient Position: Sitting, Cuff Size: Normal)   Pulse 68   Wt 159 lb (72.1 kg)   LMP 01/25/2011   BMI 25.47 kg/m     General appearance: alert, cooperative and appears stated age Abdomen: soft, tender in the suprapubic region; non distended, no masses,  no organomegaly CVA: not tender  Urine dip: +blood, 2+ leuk  ASSESSMENT Cystitis H/O recurrent UTI with intercourse Vaginal atrophy Dyspareunia    PLAN Urine for ua, c&s Bactrim and pyridium Continue with vaginal estrogen and vaginal dilators Will treat with macrodantin prn intercourse (lifelong issues with UTI with intercourse.    An After Visit Summary was printed and given to the patient.

## 2018-07-06 LAB — URINALYSIS, MICROSCOPIC ONLY
Casts: NONE SEEN /lpf
WBC, UA: >30 /hpf — AB (ref 0–5)

## 2018-07-07 LAB — URINE CULTURE

## 2018-07-08 ENCOUNTER — Other Ambulatory Visit: Payer: Self-pay | Admitting: Oncology

## 2018-07-08 DIAGNOSIS — K219 Gastro-esophageal reflux disease without esophagitis: Secondary | ICD-10-CM

## 2018-07-15 ENCOUNTER — Other Ambulatory Visit: Payer: Self-pay | Admitting: Oncology

## 2018-07-15 DIAGNOSIS — C50411 Malignant neoplasm of upper-outer quadrant of right female breast: Secondary | ICD-10-CM

## 2018-07-15 DIAGNOSIS — Z170 Estrogen receptor positive status [ER+]: Principal | ICD-10-CM

## 2018-07-20 ENCOUNTER — Other Ambulatory Visit: Payer: Self-pay | Admitting: Oncology

## 2018-08-02 ENCOUNTER — Other Ambulatory Visit: Payer: Self-pay | Admitting: Oncology

## 2018-08-02 DIAGNOSIS — K219 Gastro-esophageal reflux disease without esophagitis: Secondary | ICD-10-CM

## 2018-08-08 NOTE — Progress Notes (Signed)
GYNECOLOGY  VISIT   HPI: 57 y.o.   Divorced White or Caucasian Not Hispanic or Latino  female   [place] with Patient's last menstrual period was 01/25/2011.   here for  Possible vaginitis and itching under her arms and the insides of her ankles.  She states that her ankle and under arm itching just started a couple of days ago.  The vaginal symptoms started 2 weeks ago. Severe itching, she has been using monistat, not working. She last used the monistat on Monday.  She also has a rash in her right axilla, right ankle, and right eye. Sneezing. Has some generalized itching.   GYNECOLOGIC HISTORY: Patient's last menstrual period was 01/25/2011. Contraception: no Menopausal hormone therapy: yes estradiol         OB History    Gravida  0   Para  0   Term  0   Preterm  0   AB  0   Living  0     SAB  0   TAB  0   Ectopic  0   Multiple  0   Live Births  0              Patient Active Problem List   Diagnosis Date Noted  . Chest pain 03/07/2016  . CHF NYHA class I ([place]) 09/10/2015  . Chemotherapy adverse reaction 09/01/2015  . Hot flashes 06/04/2015  . Chemotherapy-induced neuropathy ([place]) 05/28/2015  . Fatigue 05/08/2015  . Rash 04/23/2015  . Antineoplastic chemotherapy induced anemia 04/23/2015  . Insomnia 04/23/2015  . Postmenopausal hormone replacement therapy 04/13/2015  . Drug rash 04/10/2015  . Mucositis due to chemotherapy 03/27/2015  . Constipation 03/13/2015  . Heartburn 03/13/2015  . Malignant neoplasm of upper-outer quadrant of right breast in female, estrogen receptor positive ([place]) 12/31/2014  . Hormone replacement therapy (postmenopausal) 10/18/2010  . Arachnoid cyst   . ACID REFLUX DISEASE 07/12/2010  . DYSPHAGIA UNSPECIFIED 07/12/2010  . WRIST PAIN, RIGHT 04/22/2010  . STRESS FRACTURE OF THE METATARSALS 04/22/2010  . BACK PAIN 10/05/2009  . UTI'S, RECURRENT 03/10/2009  . GLAUCOMA, LEFT EYE 01/13/2009  . ALLERGIC RHINITIS 01/13/2009  .  HEADACHE 01/13/2009    Past Medical History:  Diagnosis Date  . ACID REFLUX DISEASE 07/12/2010  . Anxiety   . Arachnoid cyst    left frontal lobe  . Arthritis    hands  . Breast cancer ([place]) 12/2014   right  . Complication of anesthesia 2012   states woke up during colonoscopy with Propofol  . Dental crowns present   . Difficulty swallowing pills   . Glaucoma   . Hypothyroidism   . Migraines   . Nervous stomach   . Personal history of chemotherapy 2016  . Personal history of radiation therapy 2016   Rt breast  . Seasonal allergies   . Stress fracture of foot 12/2014   right    Past Surgical History:  Procedure Laterality Date  . BREAST LUMPECTOMY Right    2016  . COLONOSCOPY WITH PROPOFOL  10/14/2013  . ESOPHAGOGASTRODUODENOSCOPY ENDOSCOPY    . LASIK    . PORTACATH PLACEMENT Left 01/23/2015   Procedure: INSERTION PORT-A-CATH ;  Surgeon: Fanny Skates, MD;  Location: [place];  Service: General;  Laterality: Left;  . RADIOACTIVE SEED GUIDED PARTIAL MASTECTOMY WITH AXILLARY SENTINEL LYMPH NODE BIOPSY Right 01/23/2015   Procedure: RIGHT PARTIAL MASTECTOMY WITH RADIOACTIVE SEED LOCALIZATION  WITH RIGHT AXILLARY SENTINEL LYMPH NODE BIOPSY;  Surgeon: Fanny Skates, MD;  Location: [place];  Service: General;  Laterality: Right;  . RE-EXCISION OF BREAST LUMPECTOMY Right 02/16/2015   Procedure: RE-EXCISION OF RIGHT  BREAST LUMPECTOMY MARGINS;  Surgeon: Fanny Skates, MD;  Location: [place];  Service: General;  Laterality: Right;  . TONSILLECTOMY  age 65  . TOOTH EXTRACTION      Current Outpatient Medications  Medication Sig Dispense Refill  . Estradiol 10 MCG TABS vaginal tablet Place 1 tablet (10 mcg total) vaginally 2 (two) times a week. 24 tablet 3  . gabapentin (NEURONTIN) 300 MG capsule TAKE 1 CAPSULE(300 MG) BY MOUTH AT BEDTIME 90 capsule 0  . latanoprost (XALATAN) 0.005 % ophthalmic solution 1 drop at bedtime.    .  nitrofurantoin (MACRODANTIN) 50 MG capsule Take one tablet when you have intercourse. 30 capsule 2  . Omega-3 Fatty Acids (FISH OIL) 1000 MG CPDR Take by mouth.    Marland Kitchen omeprazole (PRILOSEC) 40 MG capsule TAKE 1 CAPSULE(40 MG) BY MOUTH DAILY 30 capsule 0  . phenazopyridine (PYRIDIUM) 200 MG tablet Take 1 tablet (200 mg total) by mouth 3 (three) times daily as needed. 6 tablet 0  . RESTASIS MULTIDOSE 0.05 % ophthalmic emulsion INT 1 GTT INTO OU BID  3  . sulfamethoxazole-trimethoprim (BACTRIM DS) 800-160 MG tablet Take 1 tablet by mouth 2 (two) times daily. One PO BID x 3 days 6 tablet 0  . tamoxifen (NOLVADEX) 20 MG tablet TAKE 1 TABLET(20 MG) BY MOUTH DAILY 90 tablet 0   No current facility-administered medications for this visit.      ALLERGIES: Lortab [hydrocodone-acetaminophen]; Milk-related compounds; and Coreg [carvedilol]  Family History  Problem Relation Age of Onset  . Osteoporosis Mother   . Irritable bowel syndrome Mother   . Hypertension Mother   . Hypertension Father   . Hyperlipidemia Father   . Diabetes Father   . Dementia Father   . Colon cancer Maternal Grandfather 37  . Breast cancer Paternal Aunt     Social History   Socioeconomic History  . Marital status: Divorced    Spouse name: Not on file  . Number of children: Not on file  . Years of education: Not on file  . Highest education level: Not on file  Occupational History  . Not on file  Social Needs  . Financial resource strain: Not on file  . Food insecurity:    Worry: Not on file    Inability: Not on file  . Transportation needs:    Medical: Not on file    Non-medical: Not on file  Tobacco Use  . Smoking status: Former Smoker    Packs/day: 0.00    Years: 0.00    Pack years: 0.00    Last attempt to quit: 02/19/2005    Years since quitting: 13.4  . Smokeless tobacco: Never Used  Substance and Sexual Activity  . Alcohol use: Yes    Comment: occasionally  . Drug use: No  . Sexual activity: Not  Currently    Partners: Male    Birth control/protection: Post-menopausal  Lifestyle  . Physical activity:    Days per week: Not on file    Minutes per session: Not on file  . Stress: Not on file  Relationships  . Social connections:    Talks on phone: Not on file    Gets together: Not on file    Attends religious service: Not on file    Active member of club or organization: Not on file  Attends meetings of clubs or organizations: Not on file    Relationship status: Not on file  . Intimate partner violence:    Fear of current or ex partner: Not on file    Emotionally abused: Not on file    Physically abused: Not on file    Forced sexual activity: Not on file  Other Topics Concern  . Not on file  Social History Narrative   Hairstylist    Hh of 2    Former smoker   Husband smokes outside    Review of Systems  HENT:       Sinusitis   Gastrointestinal: Positive for constipation.  Genitourinary:       Vulvar/ Vaginal itching   Skin: Positive for itching and rash.    PHYSICAL EXAMINATION:    LMP 01/25/2011     General appearance: alert, cooperative and appears stated age Skin: she has a patchy erythematous rash in her right axilla  Pelvic: External genitalia:  no lesions, erythematous              Urethra:  normal appearing urethra with no masses, tenderness or lesions              Bartholins and Skenes: normal                 Vagina: atrophic appearing vagina with a slight increase in creamy, white vaginal d/c              Cervix: no lesions               Chaperone was present for exam.  Wet prep: ? Clue vs artifact, no trich, few wbc KOH: no yeast PH: 5   ASSESSMENT Vulvovaginitis, patient has been self treating with monistat. Possible BV on vaginal slides, no yeast seen Rash under right axilla, suspect candida intertrigo    PLAN Steroid ointment Send affirm, further treatment depending on results Will treat axilla with nystatin F/u with primary for  eye irritation and ankle rash   An After Visit Summary was printed and given to the patient.

## 2018-08-09 ENCOUNTER — Other Ambulatory Visit: Payer: Self-pay

## 2018-08-09 ENCOUNTER — Ambulatory Visit: Payer: BLUE CROSS/BLUE SHIELD | Admitting: Obstetrics and Gynecology

## 2018-08-09 ENCOUNTER — Encounter: Payer: Self-pay | Admitting: Obstetrics and Gynecology

## 2018-08-09 VITALS — BP 130/64 | HR 74 | Resp 16 | Ht 66.25 in | Wt 158.0 lb

## 2018-08-09 DIAGNOSIS — N760 Acute vaginitis: Secondary | ICD-10-CM

## 2018-08-09 DIAGNOSIS — B372 Candidiasis of skin and nail: Secondary | ICD-10-CM | POA: Diagnosis not present

## 2018-08-09 MED ORDER — BETAMETHASONE VALERATE 0.1 % EX OINT: TOPICAL_OINTMENT | CUTANEOUS | 0 refills | Status: DC

## 2018-08-09 MED ORDER — NYSTATIN 100000 UNIT/GM EX CREA: 1.0000 "application " | TOPICAL_CREAM | Freq: Two times a day (BID) | CUTANEOUS | 0 refills | Status: DC

## 2018-08-10 ENCOUNTER — Telehealth: Payer: Self-pay | Admitting: *Deleted

## 2018-08-10 LAB — VAGINITIS/VAGINOSIS, DNA PROBE
CANDIDA SPECIES: NEGATIVE
GARDNERELLA VAGINALIS: POSITIVE — AB
Trichomonas vaginosis: NEGATIVE

## 2018-08-10 MED ORDER — METRONIDAZOLE 500 MG PO TABS: 500.0000 mg | ORAL_TABLET | Freq: Two times a day (BID) | ORAL | 0 refills | Status: DC

## 2018-08-10 NOTE — Telephone Encounter (Signed)
-----   Message from Salvadore Dom, MD sent at 08/10/2018 10:41 AM EST ----- Please inform the patient that her vaginitis probe was + for BV and treat with flagyl (either oral or vaginal, her choice), no ETOH while on Flagyl.  Oral: Flagyl 500 mg BID x 7 days, or Vaginal: Metrogel, 1 applicator per vagina q day x 5 days.

## 2018-08-10 NOTE — Telephone Encounter (Signed)
Spoke with patient, advised as seen below per Dr. Talbert Nan. Rx for flagyl PO to verified pharmacy, ETOH precautions reviewed.  Patient verbalizes understanding and is agreeable.    Encounter closed.

## 2018-08-10 NOTE — Telephone Encounter (Signed)
Patient returned call and left a message returning the nurses call.

## 2018-08-10 NOTE — Telephone Encounter (Signed)
Notes recorded by Burnice Logan, RN on 08/10/2018 at 12:02 PM EST Left message to call Sharee Pimple, RN at [place].

## 2018-09-01 DIAGNOSIS — R6883 Chills (without fever): Secondary | ICD-10-CM | POA: Diagnosis not present

## 2018-09-01 DIAGNOSIS — J029 Acute pharyngitis, unspecified: Secondary | ICD-10-CM | POA: Diagnosis not present

## 2018-09-01 DIAGNOSIS — R05 Cough: Secondary | ICD-10-CM | POA: Diagnosis not present

## 2018-09-01 DIAGNOSIS — J018 Other acute sinusitis: Secondary | ICD-10-CM | POA: Diagnosis not present

## 2018-09-05 ENCOUNTER — Other Ambulatory Visit: Payer: Self-pay | Admitting: Oncology

## 2018-09-05 DIAGNOSIS — K219 Gastro-esophageal reflux disease without esophagitis: Secondary | ICD-10-CM

## 2018-09-13 ENCOUNTER — Other Ambulatory Visit: Payer: Self-pay | Admitting: Nurse Practitioner

## 2018-09-13 DIAGNOSIS — H01002 Unspecified blepharitis right lower eyelid: Secondary | ICD-10-CM | POA: Diagnosis not present

## 2018-09-13 DIAGNOSIS — H01001 Unspecified blepharitis right upper eyelid: Secondary | ICD-10-CM | POA: Diagnosis not present

## 2018-09-13 DIAGNOSIS — H01005 Unspecified blepharitis left lower eyelid: Secondary | ICD-10-CM | POA: Diagnosis not present

## 2018-09-13 DIAGNOSIS — H01004 Unspecified blepharitis left upper eyelid: Secondary | ICD-10-CM | POA: Diagnosis not present

## 2018-10-05 DIAGNOSIS — J018 Other acute sinusitis: Secondary | ICD-10-CM | POA: Diagnosis not present

## 2018-10-05 DIAGNOSIS — Z9109 Other allergy status, other than to drugs and biological substances: Secondary | ICD-10-CM | POA: Diagnosis not present

## 2018-10-14 ENCOUNTER — Other Ambulatory Visit: Payer: Self-pay | Admitting: Oncology

## 2018-10-14 DIAGNOSIS — Z170 Estrogen receptor positive status [ER+]: Principal | ICD-10-CM

## 2018-10-14 DIAGNOSIS — C50411 Malignant neoplasm of upper-outer quadrant of right female breast: Secondary | ICD-10-CM

## 2018-10-16 ENCOUNTER — Other Ambulatory Visit: Payer: Self-pay | Admitting: Oncology

## 2018-10-20 ENCOUNTER — Encounter: Payer: Self-pay | Admitting: Gastroenterology

## 2018-12-17 ENCOUNTER — Ambulatory Visit: Payer: BLUE CROSS/BLUE SHIELD | Admitting: Gastroenterology

## 2018-12-21 ENCOUNTER — Other Ambulatory Visit: Payer: Self-pay | Admitting: Oncology

## 2018-12-21 DIAGNOSIS — K219 Gastro-esophageal reflux disease without esophagitis: Secondary | ICD-10-CM

## 2019-01-12 ENCOUNTER — Other Ambulatory Visit: Payer: Self-pay | Admitting: Oncology

## 2019-01-12 DIAGNOSIS — C50411 Malignant neoplasm of upper-outer quadrant of right female breast: Secondary | ICD-10-CM

## 2019-01-29 DIAGNOSIS — Z03818 Encounter for observation for suspected exposure to other biological agents ruled out: Secondary | ICD-10-CM | POA: Diagnosis not present

## 2019-02-27 ENCOUNTER — Other Ambulatory Visit: Payer: Self-pay

## 2019-02-27 DIAGNOSIS — C50411 Malignant neoplasm of upper-outer quadrant of right female breast: Secondary | ICD-10-CM

## 2019-02-28 ENCOUNTER — Other Ambulatory Visit: Payer: Self-pay

## 2019-02-28 ENCOUNTER — Inpatient Hospital Stay: Payer: BLUE CROSS/BLUE SHIELD | Attending: Oncology

## 2019-02-28 ENCOUNTER — Inpatient Hospital Stay (HOSPITAL_BASED_OUTPATIENT_CLINIC_OR_DEPARTMENT_OTHER): Payer: BLUE CROSS/BLUE SHIELD | Admitting: Oncology

## 2019-02-28 VITALS — BP 113/78 | HR 81 | Temp 98.3°F | Resp 18 | Wt 155.9 lb

## 2019-02-28 DIAGNOSIS — Z803 Family history of malignant neoplasm of breast: Secondary | ICD-10-CM | POA: Diagnosis not present

## 2019-02-28 DIAGNOSIS — Z170 Estrogen receptor positive status [ER+]: Secondary | ICD-10-CM

## 2019-02-28 DIAGNOSIS — F419 Anxiety disorder, unspecified: Secondary | ICD-10-CM | POA: Insufficient documentation

## 2019-02-28 DIAGNOSIS — E039 Hypothyroidism, unspecified: Secondary | ICD-10-CM | POA: Diagnosis not present

## 2019-02-28 DIAGNOSIS — Z9221 Personal history of antineoplastic chemotherapy: Secondary | ICD-10-CM | POA: Insufficient documentation

## 2019-02-28 DIAGNOSIS — Z923 Personal history of irradiation: Secondary | ICD-10-CM | POA: Diagnosis not present

## 2019-02-28 DIAGNOSIS — Z79810 Long term (current) use of selective estrogen receptor modulators (SERMs): Secondary | ICD-10-CM

## 2019-02-28 DIAGNOSIS — G629 Polyneuropathy, unspecified: Secondary | ICD-10-CM

## 2019-02-28 DIAGNOSIS — Z79899 Other long term (current) drug therapy: Secondary | ICD-10-CM | POA: Diagnosis not present

## 2019-02-28 DIAGNOSIS — C50411 Malignant neoplasm of upper-outer quadrant of right female breast: Secondary | ICD-10-CM | POA: Diagnosis not present

## 2019-02-28 DIAGNOSIS — Z87891 Personal history of nicotine dependence: Secondary | ICD-10-CM | POA: Diagnosis not present

## 2019-02-28 LAB — CBC WITH DIFFERENTIAL (CANCER CENTER ONLY)
Abs Immature Granulocytes: 0.01 10*3/uL (ref 0.00–0.07)
Basophils Absolute: 0 10*3/uL (ref 0.0–0.1)
Basophils Relative: 0 %
Eosinophils Absolute: 0.1 10*3/uL (ref 0.0–0.5)
Eosinophils Relative: 2 %
HCT: 39.9 % (ref 36.0–46.0)
Hemoglobin: 12.9 g/dL (ref 12.0–15.0)
Immature Granulocytes: 0 %
Lymphocytes Relative: 34 %
Lymphs Abs: 2.1 10*3/uL (ref 0.7–4.0)
MCH: 31.9 pg (ref 26.0–34.0)
MCHC: 32.3 g/dL (ref 30.0–36.0)
MCV: 98.8 fL (ref 80.0–100.0)
Monocytes Absolute: 0.5 10*3/uL (ref 0.1–1.0)
Monocytes Relative: 8 %
Neutro Abs: 3.5 10*3/uL (ref 1.7–7.7)
Neutrophils Relative %: 56 %
Platelet Count: 262 10*3/uL (ref 150–400)
RBC: 4.04 MIL/uL (ref 3.87–5.11)
RDW: 13.7 % (ref 11.5–15.5)
WBC Count: 6.2 10*3/uL (ref 4.0–10.5)
nRBC: 0 % (ref 0.0–0.2)

## 2019-02-28 LAB — CMP (CANCER CENTER ONLY)
ALT: 13 U/L (ref 0–44)
AST: 20 U/L (ref 15–41)
Albumin: 3.9 g/dL (ref 3.5–5.0)
Alkaline Phosphatase: 57 U/L (ref 38–126)
Anion gap: 8 (ref 5–15)
BUN: 22 mg/dL — ABNORMAL HIGH (ref 6–20)
CO2: 27 mmol/L (ref 22–32)
Calcium: 8.8 mg/dL — ABNORMAL LOW (ref 8.9–10.3)
Chloride: 105 mmol/L (ref 98–111)
Creatinine: 0.84 mg/dL (ref 0.44–1.00)
GFR, Est AFR Am: >60 mL/min (ref >60)
GFR, Estimated: >60 mL/min (ref >60)
Glucose, Bld: 93 mg/dL (ref 70–99)
Potassium: 4.5 mmol/L (ref 3.5–5.1)
Sodium: 140 mmol/L (ref 135–145)
Total Bilirubin: 0.3 mg/dL (ref 0.3–1.2)
Total Protein: 7.1 g/dL (ref 6.5–8.1)

## 2019-02-28 MED ORDER — ESTRING 2 MG VA RING: 2.0000 mg | VAGINAL_RING | VAGINAL | 12 refills | Status: DC

## 2019-02-28 MED ORDER — TAMOXIFEN CITRATE 20 MG PO TABS: 20.0000 mg | ORAL_TABLET | Freq: Every day | ORAL | 4 refills | Status: DC

## 2019-02-28 MED ORDER — GABAPENTIN 300 MG PO CAPS: 300.0000 mg | ORAL_CAPSULE | Freq: Every day | ORAL | 4 refills | Status: DC

## 2019-02-28 NOTE — Progress Notes (Signed)
Bonnie Ward  [phone]) [phone] [phone]    ID: Bonnie Ward DOB: Mar 08, 1961  MR#: [id number]  TGG#:[id number]  Patient Care Team: Patient, No Pcp Per as PCP - General (General Practice) , Virgie Dad, MD as Consulting Physician (Oncology) Salvadore Dom, MD as Consulting Physician (Obstetrics and Gynecology) Verner Chol, MD as Consulting Physician (Sports Medicine) Calvert Cantor, MD as Consulting Physician (Ophthalmology) Thurman Coyer, DO as Consulting Physician (Sports Medicine) OTHER MD:  CHIEF COMPLAINT: Estrogen receptor positive breast cancer  CURRENT TREATMENT: tamoxifen   INTERVAL HISTORY: Bonnie Ward returns today for follow-up and treatment of her estrogen receptor positive breast cancer.   The patient continues on tamoxifen, which she tolerates well. She reports intense hot flashes that wake her up at night.   Since her last visit, she has not undergone any additional studies. Her most recent mammogram was 02/05/2018.   REVIEW OF SYSTEMS: Bonnie Ward reports she has returned to working full time. She walks her dogs three times a week, as well as piliates and weight training twice a week. She notes bursitis in her hip. She has numbing in her feet, left moreso. She reports leg cramps in her calves and feet that wake her up at night. She also reports "constant constipation" and trouble losing weight. A detailed review of systems was otherwise noncontributory.     BREAST CANCER HISTORY: From the initial intake note:  Bonnie Ward had routine bilateral screening mammography at the [facility] for [facility] 01/09/2015 showing a possible mass in the right breast. On 12/22/2014 she underwent diagnostic right mammography with tomosynthesis and right breast ultrasonography. The breast density was category C. In the right breast there was an area of increased density with architectural distortion and faint microcalcifications in the upper outer quadrant.  There was mild palpable soft tissue thickening in the area in question, but no palpable right axillary lymph nodes. Ultrasound confirmed a 1.7 cm irregular hypoechoic mass. There were no abnormal-appearing right axillary lymph nodes.  Biopsy of the right breast mass in question 12/26/2014 showed ([facility] 16-08/08/2005) an invasive ductal carcinoma, grade 1 or 2, with a prognostic panel still pending.  Her subsequent history is as detailed below   PAST MEDICAL HISTORY: Past Medical History:  Diagnosis Date  . ACID REFLUX DISEASE 07/12/2010  . Anxiety   . Arachnoid cyst    left frontal lobe  . Arthritis    hands  . Breast cancer ([place]) 12/2014   right  . Complication of anesthesia 2012   states woke up during colonoscopy with Propofol  . Dental crowns present   . Difficulty swallowing pills   . Glaucoma   . Hypothyroidism   . Migraines   . Nervous stomach   . Personal history of chemotherapy 2016  . Personal history of radiation therapy 2016   Rt breast  . Seasonal allergies   . Stress fracture of foot 12/2014   right    PAST SURGICAL HISTORY: Past Surgical History:  Procedure Laterality Date  . BREAST LUMPECTOMY Right    2016  . COLONOSCOPY WITH PROPOFOL  10/14/2013  . ESOPHAGOGASTRODUODENOSCOPY ENDOSCOPY    . LASIK    . PORTACATH PLACEMENT Left 01/23/2015   Procedure: INSERTION PORT-A-CATH ;  Surgeon: Fanny Skates, MD;  Location: [facility];  Service: General;  Laterality: Left;  . RADIOACTIVE SEED GUIDED PARTIAL MASTECTOMY WITH AXILLARY SENTINEL LYMPH NODE BIOPSY Right 01/23/2015   Procedure: RIGHT PARTIAL MASTECTOMY WITH RADIOACTIVE SEED LOCALIZATION  WITH RIGHT AXILLARY SENTINEL  LYMPH NODE BIOPSY;  Surgeon: Fanny Skates, MD;  Location: [facility];  Service: General;  Laterality: Right;  . RE-EXCISION OF BREAST LUMPECTOMY Right 02/16/2015   Procedure: RE-EXCISION OF RIGHT  BREAST LUMPECTOMY MARGINS;  Surgeon: Fanny Skates, MD;  Location: [facility];  Service: General;  Laterality: Right;  . TONSILLECTOMY  age 59  . TOOTH EXTRACTION      FAMILY HISTORY Family History  Problem Relation Age of Onset  . Osteoporosis Mother   . Irritable bowel syndrome Mother   . Hypertension Mother   . Hypertension Father   . Hyperlipidemia Father   . Diabetes Father   . Dementia Father   . Colon cancer Maternal Grandfather 60  . Breast cancer Paternal Aunt    the patient's father died at age 73 from pneumonia. [name] died at age 25 after bowel perforation. The patient had one brother, one sister. There is no history of breast or ovarian cancer in the family.   GYNECOLOGIC HISTORY:  Patient's last menstrual period was 01/25/2011. Menarche age 50, the patient is GX P0. She stopped having periods approximately 2011. She is still on hormone replacement, but is "trickling off it". She did take birth control pills for approximately 3 years remotely with no complications   SOCIAL HISTORY: (Updated 02/28/2019) Bonnie Ward works as a Haematologist. She is divorced and lives by herself with her 2 dogs, a labradoodle and a maltipoo.    ADVANCED DIRECTIVES: The patient's brother Bonnie Ward is her healthcare power of attorney. He may be reached in [place] at [place]: Social History   Tobacco Use  . Smoking status: Former Smoker    Packs/day: 0.00    Years: 0.00    Pack years: 0.00    Quit date: 02/19/2005    Years since quitting: 14.0  . Smokeless tobacco: Never Used  Substance Use Topics  . Alcohol use: Yes    Comment: occasionally  . Drug use: No     Colonoscopy: February 2016  PAP: June 2015  Bone density: 12/02/2013-- T - 2.2  Lipid panel:  Allergies  Allergen Reactions  . Lortab [Hydrocodone-Acetaminophen] Anxiety  . Milk-Related Compounds Diarrhea    GI UPSET  . Coreg [Carvedilol] Other (See Comments)    Fatigue     Current Outpatient Medications  Medication Sig Dispense  Refill  . estradiol (ESTRING) 2 MG vaginal ring Place 2 mg vaginally every 3 (three) months. follow package directions 5 each 12  . gabapentin (NEURONTIN) 300 MG capsule Take 1 capsule (300 mg total) by mouth at bedtime. 90 capsule 4  . latanoprost (XALATAN) 0.005 % ophthalmic solution 1 drop at bedtime.    . Omega-3 Fatty Acids (FISH OIL) 1000 MG CPDR Take by mouth.    Marland Kitchen omeprazole (PRILOSEC) 40 MG capsule TAKE 1 CAPSULE(40 MG) BY MOUTH DAILY 30 capsule 3  . RESTASIS MULTIDOSE 0.05 % ophthalmic emulsion INT 1 GTT INTO OU BID  3  . tamoxifen (NOLVADEX) 20 MG tablet Take 1 tablet (20 mg total) by mouth daily. 90 tablet 4   No current facility-administered medications for this visit.     OBJECTIVE: Middle-aged white woman who appears stated age  88:   02/28/19 1313  BP: 133/67  Pulse: (!) 102  Resp: 18  Temp: 98.9 F (37.2 C)  SpO2: 96%     Body mass index is 21.8 kg/m.    ECOG FS:0 - Asymptomatic  Sclerae unicteric, EOMs intact Wearing  a mask No cervical or supraclavicular adenopathy Lungs no rales or rhonchi Heart regular rate and rhythm Abd soft, nontender, positive bowel sounds MSK no focal spinal tenderness, Heberden nodes right hand Neuro: nonfocal, well oriented, appropriate affect Breasts: The right breast is status post lumpectomy and radiation.  There is no evidence of disease recurrence.  The left breast is benign.  Both axillae are benign.  LAB RESULTS:  CMP     Component Value Date/Time   NA 140 02/28/2019 1234   NA 141 05/07/2018 1332   NA 142 12/26/2016 0959   K 4.5 02/28/2019 1234   K 4.0 12/26/2016 0959   CL 105 02/28/2019 1234   CO2 27 02/28/2019 1234   CO2 27 12/26/2016 0959   GLUCOSE 93 02/28/2019 1234   GLUCOSE 107 12/26/2016 0959   BUN 22 (H) 02/28/2019 1234   BUN 15 05/07/2018 1332   BUN 10.4 12/26/2016 0959   CREATININE 0.84 02/28/2019 1234   CREATININE 0.8 12/26/2016 0959   CALCIUM 8.8 (L) 02/28/2019 1234   CALCIUM 8.8 12/26/2016 0959    PROT 7.1 02/28/2019 1234   PROT 6.4 05/07/2018 1332   PROT 6.8 12/26/2016 0959   ALBUMIN 3.9 02/28/2019 1234   ALBUMIN 4.2 05/07/2018 1332   ALBUMIN 3.7 12/26/2016 0959   AST 20 02/28/2019 1234   AST 28 12/26/2016 0959   ALT 13 02/28/2019 1234   ALT 26 12/26/2016 0959   ALKPHOS 57 02/28/2019 1234   ALKPHOS 78 12/26/2016 0959   BILITOT 0.3 02/28/2019 1234   BILITOT 0.40 12/26/2016 0959   GFRNONAA >60 02/28/2019 1234   GFRAA >60 02/28/2019 1234    INo results found for: SPEP, UPEP  Lab Results  Component Value Date   WBC 6.2 02/28/2019   NEUTROABS 3.5 02/28/2019   HGB 12.9 02/28/2019   HCT 39.9 02/28/2019   MCV 98.8 02/28/2019   PLT 262 02/28/2019      Chemistry      Component Value Date/Time   NA 140 02/28/2019 1234   NA 141 05/07/2018 1332   NA 142 12/26/2016 0959   K 4.5 02/28/2019 1234   K 4.0 12/26/2016 0959   CL 105 02/28/2019 1234   CO2 27 02/28/2019 1234   CO2 27 12/26/2016 0959   BUN 22 (H) 02/28/2019 1234   BUN 15 05/07/2018 1332   BUN 10.4 12/26/2016 0959   CREATININE 0.84 02/28/2019 1234   CREATININE 0.8 12/26/2016 0959      Component Value Date/Time   CALCIUM 8.8 (L) 02/28/2019 1234   CALCIUM 8.8 12/26/2016 0959   ALKPHOS 57 02/28/2019 1234   ALKPHOS 78 12/26/2016 0959   AST 20 02/28/2019 1234   AST 28 12/26/2016 0959   ALT 13 02/28/2019 1234   ALT 26 12/26/2016 0959   BILITOT 0.3 02/28/2019 1234   BILITOT 0.40 12/26/2016 0959       No results found for: LABCA2  No components found for: LABCA125  No results for input(s): INR in the last 168 hours.  Urinalysis    Component Value Date/Time   COLORURINE yellow 10/05/2009 1454   APPEARANCEUR Clear 10/05/2009 1454   LABSPEC 1.010 10/05/2009 1454   PHURINE 7.0 10/05/2009 1454   HGBUR 2+ 10/05/2009 1454   BILIRUBINUR negative 07/05/2018 0820   PROTEINUR Negative 07/05/2018 0820   UROBILINOGEN 0.2 07/05/2018 0820   UROBILINOGEN 0.2 10/05/2009 1454   NITRITE negative 07/05/2018 0820    NITRITE negative 10/05/2009 1454   LEUKOCYTESUR Moderate (2+) (A) 07/05/2018 0820  STUDIES: No results found.  ASSESSMENT: 58 y.o. [place] woman status post right breastUpper outer quadrant biopsy 12/26/2014 for a clinical T1 cN0, stage IA invasive ductal carcinoma, grade 1 or 2, estrogen and progesterone receptor positive, with an MIB-1 of 11%, and HER-2 amplified, with a signals ratio of 2.07, number per cell 2.28.  (1) status post right lumpectomy and sentinel lymph node sampling 01/23/2015 for a pT1c pN1, stage IIA invasive ductal carcinoma, grade 1, with focally positive anterior margins  (a) additional surgery 02/16/2015 found residual disease but cleared margins  (2) chemotherapy started 03/05/2015, consisting of doxorubicin and cyclophosphamide in dose dense fashion 4, completed 04/16/2015, followed by paclitaxel weekly x 12 planned cycles, started 04/30/2015,  with trastuzumab and pertuzumab given every 3 weeks.  (a) Paclitaxel stopped after 4 cycles because of progressive neuropathy, last dose 05/28/2015  (b) pertuzumab stopped after 2 cycles (when paclitaxel stopped)  (3) trastuzumab was continued to complete 1 year-- final dose 05/23/2016  (a) echocardiogram 08/19/2016 shows a >10% drop-- trastuzumab held after 07/30/2015 dose  (b) repeat echocardiogram 10/12/2015 shows EF recovery to 55%, trastuzumab resumed 10/22/2015  (c) echocardiogram 03/07/2016 shows a stable ejection fraction  (d) echocardiogram 06/20/2016 shows an ejection fraction of 55%   (4) adjuvant radiation 07/06/2015-08/21/2015::  Right breast / 45 Gray @ 1.8 Pearline Cables per fraction x 25 fractions Right supraclavicular fossa/PAB 45 Gy '@1' .8 Gy per fraction x 25 fractions Right breast boost / 16 Gray at [employer] per fraction x 8 fractions  (5) started tamoxifen 09/10/2015  PLAN: Eryka is now a little over 4 years out from definitive surgery for her breast cancer with no evidence of disease recurrence.   This is very favorable.  She is tolerating tamoxifen well.  This will continue for 1 more year at which point she will be ready to "graduate".  For some reason, likely cost, her insurance did not approve her estrogen suppositories.  We are going to try Estring and see how that works for her.  She understands that once she is off tamoxifen I will not feel comfortable with vaginal estrogens  At the next visit she will have the option of "getting out of the cancer business" or participating in our survivorship clinic.  We discussed Heberden nodes which are a form of osteoarthritis.  I do not have a simple solution for her in regards to that  She knows to call for any other issue that may develop before the next visit.   , Virgie Dad, MD  02/28/19 1:35 PM Medical Oncology and Hematology [facility] [address] [place], [place] [address] Tel. [phone]    Fax. [phone]   I, Wilburn Mylar, am acting as scribe for Dr. Virgie Dad. .  I, Lurline Del MD, have reviewed the above documentation for accuracy and completeness, and I agree with the above.

## 2019-05-09 ENCOUNTER — Other Ambulatory Visit: Payer: Self-pay

## 2019-05-13 ENCOUNTER — Ambulatory Visit: Payer: BLUE CROSS/BLUE SHIELD | Admitting: Obstetrics and Gynecology

## 2019-05-13 ENCOUNTER — Other Ambulatory Visit: Payer: Self-pay | Admitting: Oncology

## 2019-05-13 DIAGNOSIS — K219 Gastro-esophageal reflux disease without esophagitis: Secondary | ICD-10-CM

## 2019-08-20 ENCOUNTER — Other Ambulatory Visit: Payer: Self-pay | Admitting: Obstetrics and Gynecology

## 2019-08-20 ENCOUNTER — Encounter: Payer: Self-pay | Admitting: Gastroenterology

## 2019-09-16 ENCOUNTER — Other Ambulatory Visit: Payer: Self-pay

## 2019-09-16 ENCOUNTER — Ambulatory Visit
Admission: RE | Admit: 2019-09-16 | Discharge: 2019-09-16 | Disposition: A | Discharge status: Home / Self Care | Payer: BLUE CROSS/BLUE SHIELD | Source: Ambulatory Visit | Attending: Oncology | Admitting: Oncology

## 2019-09-16 DIAGNOSIS — C50411 Malignant neoplasm of upper-outer quadrant of right female breast: Secondary | ICD-10-CM

## 2019-09-16 DIAGNOSIS — Z170 Estrogen receptor positive status [ER+]: Secondary | ICD-10-CM

## 2019-10-01 ENCOUNTER — Encounter: Payer: Self-pay | Admitting: Gastroenterology

## 2019-10-01 ENCOUNTER — Ambulatory Visit (INDEPENDENT_AMBULATORY_CARE_PROVIDER_SITE_OTHER): Payer: 59 | Admitting: Gastroenterology

## 2019-10-01 VITALS — BP 118/76 | HR 84 | Temp 98.0°F | Ht 66.0 in | Wt 159.1 lb

## 2019-10-01 DIAGNOSIS — Z01818 Encounter for other preprocedural examination: Secondary | ICD-10-CM

## 2019-10-01 DIAGNOSIS — K219 Gastro-esophageal reflux disease without esophagitis: Secondary | ICD-10-CM

## 2019-10-01 DIAGNOSIS — Z86010 Personal history of colonic polyps: Secondary | ICD-10-CM | POA: Diagnosis not present

## 2019-10-01 MED ORDER — FAMOTIDINE 20 MG PO TABS: 20.0000 mg | ORAL_TABLET | Freq: Every day | ORAL | 11 refills | Status: DC

## 2019-10-01 MED ORDER — SUPREP BOWEL PREP KIT 17.5-3.13-1.6 GM/177ML PO SOLN: 1.0000 | ORAL | 0 refills | Status: DC

## 2019-10-01 MED ORDER — OMEPRAZOLE 40 MG PO CPDR: DELAYED_RELEASE_CAPSULE | ORAL | 11 refills | Status: DC

## 2019-10-01 NOTE — Patient Instructions (Addendum)
If you are age 59 or older, your body mass index should be between 23-30. Your Body mass index is 25.68 kg/m. If this is out of the aforementioned range listed, please consider follow up with your Primary Care Provider.  If you are age 76 or younger, your body mass index should be between 19-25. Your Body mass index is 25.68 kg/m. If this is out of the aformentioned range listed, please consider follow up with your Primary Care Provider.   You have been scheduled for a colonoscopy. Please follow written instructions given to you at your visit today.  Please pick up your prep supplies at the pharmacy within the next 1-3 days. If you use inhalers (even only as needed), please bring them with you on the day of your procedure.  We have sent the following medications to your pharmacy for you to pick up at your convenience:  START: omeprazole 40mg  one capsule 20-30 minutes before dinner meal.  START: pepcid 20mg  one tablet daily at bedtime.  Thank you, Dr Ardis Hughs

## 2019-10-01 NOTE — Progress Notes (Signed)
Review of pertinent gastrointestinal problems: 1. Precancerous polyps in colon: colonoscopy Three pre-cancerous ("serrated adenomas" in right colon) removed 2012 Bonnie Ward.  Repeat colonoscopy Bonnie Ward 09/2013 found no further polyps.  Recall recommended 5 years. 2. GERD related dyspepsia 09/2010 EGD Bonnie Ward 2cm HH otherwise normal exam.  Proton pump inhibitors caused constipation.  Trial of H2 blockers in 2015   HPI: This is a very pleasant 59 year old woman Whom I last saw about 6 years ago.  At that time we talked about her GERD related dyspepsia and I recommended a trial of H2 blockers on a scheduled basis since proton pump inhibitors seem to reliably cause her to have constipation.  We have not heard from her since.  In 2016 she was diagnosed with breast cancer.  She has undergone 2 lumpectomies, chemotherapy and radiation therapy.  She is off tamoxifen and this is her last year of the tamoxifen.    Her GERD has been worse lately.  She has burning in her chest shortly after waking up.  Drinking her morning coffee often makes it worse.  She has been taking omeprazole 40 mg first thing in the morning.  She generally drinks of coffee before she eats anything.  She has no dysphagia.  Her weight has been going up in the past year or so.  She has probably gained 5 pounds throughout the Covid pandemic.  No troubles with her bowels.  Colon cancer does not run in her family.   Old Data Reviewed:  Blood work July 2020 showed normal CBC and normal complete metabolic profile     Review of systems: Pertinent positive and negative review of systems were noted in the above HPI section. All other review negative.   Past Medical History:  Diagnosis Date  . ACID REFLUX DISEASE 07/12/2010  . Anxiety   . Arachnoid cyst    left frontal lobe  . Arthritis    hands  . Breast cancer ([place]) 12/2014   right  . Complication of anesthesia 2012   states woke up during colonoscopy with Propofol  . Dental crowns  present   . Difficulty swallowing pills   . Glaucoma   . Hypothyroidism   . Migraines   . Nervous stomach   . Personal history of chemotherapy 2016  . Personal history of radiation therapy 2016   Rt breast  . Seasonal allergies   . Stress fracture of foot 12/2014   right    Past Surgical History:  Procedure Laterality Date  . BREAST LUMPECTOMY Right    2016  . COLONOSCOPY WITH PROPOFOL  10/14/2013  . ESOPHAGOGASTRODUODENOSCOPY ENDOSCOPY    . LASIK    . PORTACATH PLACEMENT Left 01/23/2015   Procedure: INSERTION PORT-A-CATH ;  Surgeon: Fanny Skates, MD;  Location: [facility];  Service: General;  Laterality: Left;  . RADIOACTIVE SEED GUIDED PARTIAL MASTECTOMY WITH AXILLARY SENTINEL LYMPH NODE BIOPSY Right 01/23/2015   Procedure: RIGHT PARTIAL MASTECTOMY WITH RADIOACTIVE SEED LOCALIZATION  WITH RIGHT AXILLARY SENTINEL LYMPH NODE BIOPSY;  Surgeon: Fanny Skates, MD;  Location: [facility];  Service: General;  Laterality: Right;  . RE-EXCISION OF BREAST LUMPECTOMY Right 02/16/2015   Procedure: RE-EXCISION OF RIGHT  BREAST LUMPECTOMY MARGINS;  Surgeon: Fanny Skates, MD;  Location: [facility];  Service: General;  Laterality: Right;  . TONSILLECTOMY  age 23  . TOOTH EXTRACTION      Current Outpatient Medications  Medication Sig Dispense Refill  . gabapentin (NEURONTIN) 300 MG capsule Take 1 capsule (  300 mg total) by mouth at bedtime. 90 capsule 4  . latanoprost (XALATAN) 0.005 % ophthalmic solution 1 drop at bedtime.    . Omega-3 Fatty Acids (FISH OIL) 1000 MG CPDR Take 1 capsule by mouth daily.     Marland Kitchen omeprazole (PRILOSEC OTC) 20 MG tablet Take 40 mg by mouth daily.    . RESTASIS MULTIDOSE 0.05 % ophthalmic emulsion INT 1 GTT INTO OU BID  3  . tamoxifen (NOLVADEX) 20 MG tablet Take 1 tablet (20 mg total) by mouth daily. 90 tablet 4   No current facility-administered medications for this visit.    Allergies as of 10/01/2019 - Review Complete  10/01/2019  Allergen Reaction Noted  . Lortab [hydrocodone-acetaminophen] Anxiety 02/12/2015  . Milk-related compounds Diarrhea 01/22/2015  . Coreg [carvedilol] Other (See Comments) 11/25/2015    Family History  Problem Relation Age of Onset  . Osteoporosis Mother   . Irritable bowel syndrome Mother   . Hypertension Mother   . Hypertension Father   . Hyperlipidemia Father   . Diabetes Father   . Dementia Father   . Colon cancer Maternal Grandfather 42  . Breast cancer Paternal Aunt     Social History   Socioeconomic History  . Marital status: Divorced    Spouse name: Not on file  . Number of children: Not on file  . Years of education: Not on file  . Highest education level: Not on file  Occupational History  . Not on file  Tobacco Use  . Smoking status: Former Smoker    Packs/day: 0.00    Years: 0.00    Pack years: 0.00    Types: Cigarettes    Quit date: 02/19/2005    Years since quitting: 14.6  . Smokeless tobacco: Never Used  Substance and Sexual Activity  . Alcohol use: Yes    Comment: occasionally  . Drug use: No  . Sexual activity: Not Currently    Partners: Male    Birth control/protection: Post-menopausal  Other Topics Concern  . 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 Difficulty of Paying Living Expenses: Not on file  Food Insecurity:   . Worried About Charity fundraiser in the Last Year: Not on file  . Ran Out of Food in the Last Year: Not on file  Transportation Needs:   . Lack of Transportation (Medical): Not on file  . Lack of Transportation (Non-Medical): Not on file  Physical Activity:   . Days of Exercise per Week: Not on file  . Minutes of Exercise per Session: Not on file  Stress:   . Feeling of Stress : Not on file  Social Connections:   . Frequency of Communication with Friends and Family: Not on file   . Frequency of Social Gatherings with Friends and Family: Not on file  . Attends Religious Services: Not on file  . Active Member of Clubs or Organizations: Not on file  . Attends Archivist Meetings: Not on file  . Marital Status: Not on file  Intimate Partner Violence:   . Fear of Current or Ex-Partner: Not on file  . Emotionally Abused: Not on file  . Physically Abused: Not on file  . Sexually Abused: Not on file     Physical Exam: Temp 98 F (36.7 C)  Wt 159 lb 2 oz (72.2 kg)   LMP 01/25/2011   BMI 25.49 kg/m  Constitutional: generally well-appearing Psychiatric: alert and oriented x3 Eyes: extraocular movements intact Mouth: oral pharynx moist, no lesions Neck: supple no lymphadenopathy Cardiovascular: heart regular rate and rhythm Lungs: clear to auscultation bilaterally Abdomen: soft, nontender, nondistended, no obvious ascites, no peritoneal signs, normal bowel sounds Extremities: no lower extremity edema bilaterally Skin: no lesions on visible extremities   Assessment and plan: 59 y.o. female with history of precancerous colon polyps, GERD  First she had 3 sessile serrated adenomas removed in 2012.  I recommended a colonoscopy at her soonest convenience for surveillance.  She is having no real lower GI issues and does not have a family history of colon cancer.  She has chronic GERD and it has been worse lately she has not taken proton pump inhibitor the correct time in relation to meals.  We will call in a new prescription for omeprazole 40 mg, she will take 1 pill 20 to 30 minutes prior to her dinner meal.  She will also start taking an H2 blocker Pepcid/famotidine 20 mg pills at bedtime every night.  She has no alarm symptoms and so I do not think she needs upper endoscopy at this point.   Please see the "Patient Instructions" section for addition details about the plan.   Owens Loffler, MD [facility] 10/01/2019, 9:41 AM   Total time  on date of encounter was 45 minutes (this included time spent preparing to see the patient reviewing records; obtaining and/or reviewing separately obtained history; performing a medically appropriate exam and/or evaluation; counseling and educating the patient and family if present; ordering medications, tests or procedures if applicable; and documenting clinical information in the health record).

## 2019-10-02 NOTE — Progress Notes (Addendum)
59 y.o. G0P0000 Divorced White or Caucasian Not Hispanic or Latino female here for annual exam.  No vaginal bleeding.     She is wondering if she needs to get the shingles vaccine.   She thinks she might have a vaginal infection, notices a fishy odor. Symptoms have been intermittent for the last few months.   She has a h/o estrogen/progesterone receptor positive breast cancer, diagnosed in 2016. S/p lumpectomy, SLN biopsy. S/P chemo and radiation. Started tamoxifen in 1/17.  She was on vaginal estrogen, lost her insurance so hasn't been on it for a long time, she wants to restart it. She knows she needs to come off when she stops tamoxifen. Not sure if she is stopping tamoxifen later this year or if she will extend it past 5 years, will further discuss with Dr Penny Pia.   She hasn't been sexually active for over a year. It was way to painful when she tried. No current partner. She tried vaginal dilators briefly, too uncomfortable.   She has had an increase in anxiety since covid started. She hasn't tolerated SSRI's in the past. She has tried Brewing technologist, zoloft and prozac.   Patient's last menstrual period was 01/25/2011.          Sexually active: No.  The current method of family planning is post menopausal status.    Exercising: Yes.    [place] walking dog and [place]  Smoker:  no  Health Maintenance:   Pap:  05/01/17 neg HR HPV NEG  History of abnormal Pap:  no MMG:  09/16/19 Density C Bi-rads 2 Benign  BMD:   01/30/2017 osteoporosis  Colonoscopy:10/14/2013 repeat in 5 years, history of polyps.  Done at [facility]  She will be getting a colonoscopy in March.  TDaP:  2013  Gardasil: NA   reports that she quit smoking about 14 years ago. Her smoking use included cigarettes. She smoked 0.00 packs per day for 0.00 years. She has never used smokeless tobacco. She reports current alcohol use. She reports that she does not use drugs. Social ETOH.  Hairstylist, has 2 dogs, labradoodle and a  maltipoo.   Past Medical History:  Diagnosis Date  . ACID REFLUX DISEASE 07/12/2010  . Anxiety   . Arachnoid cyst    left frontal lobe  . Arthritis    hands  . Breast cancer ([place]) 12/2014   right  . Complication of anesthesia 2012   states woke up during colonoscopy with Propofol  . Dental crowns present   . Difficulty swallowing pills   . Glaucoma   . Hypothyroidism   . Migraines   . Nervous stomach   . Personal history of chemotherapy 2016  . Personal history of radiation therapy 2016   Rt breast  . Seasonal allergies   . Stress fracture of foot 12/2014   right    Past Surgical History:  Procedure Laterality Date  . BREAST LUMPECTOMY Right    2016  . COLONOSCOPY WITH PROPOFOL  10/14/2013  . ESOPHAGOGASTRODUODENOSCOPY ENDOSCOPY    . LASIK    . PORTACATH PLACEMENT Left 01/23/2015   Procedure: INSERTION PORT-A-CATH ;  Surgeon: Fanny Skates, MD;  Location: [facility];  Service: General;  Laterality: Left;  . RADIOACTIVE SEED GUIDED PARTIAL MASTECTOMY WITH AXILLARY SENTINEL LYMPH NODE BIOPSY Right 01/23/2015   Procedure: RIGHT PARTIAL MASTECTOMY WITH RADIOACTIVE SEED LOCALIZATION  WITH RIGHT AXILLARY SENTINEL LYMPH NODE BIOPSY;  Surgeon: Fanny Skates, MD;  Location: [facility];  Service: General;  Laterality: Right;  . RE-EXCISION OF BREAST LUMPECTOMY Right 02/16/2015   Procedure: RE-EXCISION OF RIGHT  BREAST LUMPECTOMY MARGINS;  Surgeon: Fanny Skates, MD;  Location: [place];  Service: General;  Laterality: Right;  . TONSILLECTOMY  age 38  . TOOTH EXTRACTION      Current Outpatient Medications  Medication Sig Dispense Refill  . famotidine (PEPCID) 20 MG tablet Take 1 tablet (20 mg total) by mouth at bedtime. 30 tablet 11  . gabapentin (NEURONTIN) 300 MG capsule Take 1 capsule (300 mg total) by mouth at bedtime. 90 capsule 4  . latanoprost (XALATAN) 0.005 % ophthalmic solution 1 drop at bedtime.    . Omega-3 Fatty Acids (FISH  OIL) 1000 MG CPDR Take 1 capsule by mouth daily.     Marland Kitchen omeprazole (PRILOSEC) 40 MG capsule Take 20-30 minutes before dinner 30 capsule 11  . RESTASIS MULTIDOSE 0.05 % ophthalmic emulsion INT 1 GTT INTO OU BID  3  . tamoxifen (NOLVADEX) 20 MG tablet Take 1 tablet (20 mg total) by mouth daily. 90 tablet 4  . citalopram (CELEXA) 10 MG tablet Take 1 tablet (10 mg total) by mouth daily. 30 tablet 1  . Estradiol 10 MCG TABS vaginal tablet Place one tablet vaginally qhs x 7 nights, then change to 2 x a week. 24 tablet 4  . Na Sulfate-K Sulfate-Mg Sulf (SUPREP BOWEL PREP KIT) 17.5-3.13-1.6 GM/177ML SOLN Take 1 kit by mouth as directed. (Patient not taking: Reported on 10/07/2019) 324 mL 0   No current facility-administered medications for this visit.    Family History  Problem Relation Age of Onset  . Osteoporosis Mother   . Irritable bowel syndrome Mother   . Hypertension Mother   . Hypertension Father   . Hyperlipidemia Father   . Diabetes Father   . Dementia Father   . Colon cancer Maternal Grandfather 19  . Breast cancer Paternal Aunt     Review of Systems  All other systems reviewed and are negative.   Exam:   BP 110/72   Pulse 83   Temp 98.6 F (37 C)   Ht '5\' 6"'  (1.676 m)   Wt 155 lb (70.3 kg)   LMP 01/25/2011   SpO2 95%   BMI 25.02 kg/m   Weight change: '@WEIGHTCHANGE' @ Height:   Height: '5\' 6"'  (167.6 cm)  Ht Readings from Last 3 Encounters:  10/07/19 '5\' 6"'  (1.676 m)  10/01/19 '5\' 6"'  (1.676 m)  08/09/18 5' 6.25" (1.683 m)    General appearance: alert, cooperative and appears stated age Head: Normocephalic, without obvious abnormality, atraumatic Neck: no adenopathy, supple, symmetrical, trachea midline and thyroid normal to inspection and palpation Lungs: clear to auscultation bilaterally Cardiovascular: regular rate and rhythm Breasts: normal appearance, no masses or tenderness, evidence of lumpectomy on the right.  Abdomen: soft, non-tender; non distended,  no masses,   no organomegaly Extremities: extremities normal, atraumatic, no cyanosis or edema Skin: Skin color, texture, turgor normal. No rashes or lesions Lymph nodes: Cervical, supraclavicular, and axillary nodes normal. No abnormal inguinal nodes palpated Neurologic: Grossly normal   Pelvic: External genitalia:  no lesions              Urethra:  normal appearing urethra with no masses, tenderness or lesions              Bartholins and Skenes: normal                 Vagina: very atrophic appearing vagina a slight increase  in watery, yellow vaginal d/c. Tight introitus, unable to fully insert one finger vaginally. Uncomfortable with opening the pediatric speculum.              Cervix: only anterior lip seen, not lesions.                Bimanual Exam:  Uterus:  patient uncomfortable which limited the exam, no masses or tenderness.               Adnexa: no mass, fullness, tenderness               Rectovaginal: Confirms               Anus:  normal sphincter tone, no lesions  Gae Dry chaperoned for the exam.  A:  Well Woman with normal exam  H/O osteoporosis  H/O breast cancer  Vaginal atrophy, will restart vaginal estrogen, must go off when she stops tamoxifen  Vaginal odor  Pre-diabetes  Anxiety  P:   No pap this year  Colonoscopy scheduled  Mammogram UTD  DEXA ordered  Screening labs, vit d, [id number]  Discussed breast self exam  Discussed calcium and vit D intake  Start 10 mg of Celexa (sensitive to [employer])  Virtual visit

## 2019-10-07 ENCOUNTER — Ambulatory Visit (INDEPENDENT_AMBULATORY_CARE_PROVIDER_SITE_OTHER): Payer: 59 | Admitting: Obstetrics and Gynecology

## 2019-10-07 ENCOUNTER — Encounter: Payer: Self-pay | Admitting: Obstetrics and Gynecology

## 2019-10-07 ENCOUNTER — Other Ambulatory Visit: Payer: Self-pay

## 2019-10-07 VITALS — BP 110/72 | HR 83 | Temp 98.6°F | Ht 66.0 in | Wt 155.0 lb

## 2019-10-07 DIAGNOSIS — R7303 Prediabetes: Secondary | ICD-10-CM | POA: Diagnosis not present

## 2019-10-07 DIAGNOSIS — E559 Vitamin D deficiency, unspecified: Secondary | ICD-10-CM

## 2019-10-07 DIAGNOSIS — N898 Other specified noninflammatory disorders of vagina: Secondary | ICD-10-CM

## 2019-10-07 DIAGNOSIS — Z0000 Encounter for general adult medical examination without abnormal findings: Secondary | ICD-10-CM | POA: Diagnosis not present

## 2019-10-07 DIAGNOSIS — M810 Age-related osteoporosis without current pathological fracture: Secondary | ICD-10-CM

## 2019-10-07 DIAGNOSIS — Z01419 Encounter for gynecological examination (general) (routine) without abnormal findings: Secondary | ICD-10-CM

## 2019-10-07 DIAGNOSIS — F419 Anxiety disorder, unspecified: Secondary | ICD-10-CM

## 2019-10-07 MED ORDER — ESTRADIOL 10 MCG VA TABS: ORAL_TABLET | VAGINAL | 4 refills | Status: DC

## 2019-10-07 MED ORDER — CITALOPRAM HYDROBROMIDE 10 MG PO TABS: 10.0000 mg | ORAL_TABLET | Freq: Every day | ORAL | 1 refills | Status: DC

## 2019-10-07 NOTE — Patient Instructions (Signed)
EXERCISE AND DIET:  We recommended that you start or continue a regular exercise program for good health. Regular exercise means any activity that makes your heart beat faster and makes you sweat.  We recommend exercising at least 30 minutes per day at least 3 days a week, preferably 4 or 5.  We also recommend a diet low in fat and sugar.  Inactivity, poor dietary choices and obesity can cause diabetes, heart attack, stroke, and kidney damage, among others.    ALCOHOL AND SMOKING:  Women should limit their alcohol intake to no more than 7 drinks/beers/glasses of wine (combined, not each!) per week. Moderation of alcohol intake to this level decreases your risk of breast cancer and liver damage. And of course, no recreational drugs are part of a healthy lifestyle.  And absolutely no smoking or even second hand smoke. Most people know smoking can cause heart and lung diseases, but did you know it also contributes to weakening of your bones? Aging of your skin?  Yellowing of your teeth and nails?  CALCIUM AND VITAMIN D:  Adequate intake of calcium and Vitamin D are recommended.  The recommendations for exact amounts of these supplements seem to change often, but generally speaking 1,200 mg of calcium (between diet and supplement) and 800 units of Vitamin D per day seems prudent. Certain women may benefit from higher intake of Vitamin D.  If you are among these women, your doctor will have told you during your visit.    PAP SMEARS:  Pap smears, to check for cervical cancer or precancers,  have traditionally been done yearly, although recent scientific advances have shown that most women can have pap smears less often.  However, every woman still should have a physical exam from her gynecologist every year. It will include a breast check, inspection of the vulva and vagina to check for abnormal growths or skin changes, a visual exam of the cervix, and then an exam to evaluate the size and shape of the uterus and  ovaries.  And after 59 years of age, a rectal exam is indicated to check for rectal cancers. We will also provide age appropriate advice regarding health maintenance, like when you should have certain vaccines, screening for sexually transmitted diseases, bone density testing, colonoscopy, mammograms, etc.   MAMMOGRAMS:  All women over 40 years old should have a yearly mammogram. Many facilities now offer a "3D" mammogram, which may cost around $50 extra out of pocket. If possible,  we recommend you accept the option to have the 3D mammogram performed.  It both reduces the number of women who will be called back for extra views which then turn out to be normal, and it is better than the routine mammogram at detecting truly abnormal areas.    COLON CANCER SCREENING: Now recommend starting at age 45. At this time colonoscopy is not covered for routine screening until 50. There are take home tests that can be done between 45-49.   COLONOSCOPY:  Colonoscopy to screen for colon cancer is recommended for all women at age 50.  We know, you hate the idea of the prep.  We agree, BUT, having colon cancer and not knowing it is worse!!  Colon cancer so often starts as a polyp that can be seen and removed at colonscopy, which can quite literally save your life!  And if your first colonoscopy is normal and you have no family history of colon cancer, most women don't have to have it again for   10 years.  Once every ten years, you can do something that may end up saving your life, right?  We will be happy to help you get it scheduled when you are ready.  Be sure to check your insurance coverage so you understand how much it will cost.  It may be covered as a preventative service at no cost, but you should check your particular policy.      Breast Self-Awareness Breast self-awareness means being familiar with how your breasts look and feel. It involves checking your breasts regularly and reporting any changes to your  health care provider. Practicing breast self-awareness is important. A change in your breasts can be a sign of a serious medical problem. Being familiar with how your breasts look and feel allows you to find any problems early, when treatment is more likely to be successful. All women should practice breast self-awareness, including women who have had breast implants. How to do a breast self-exam One way to learn what is normal for your breasts and whether your breasts are changing is to do a breast self-exam. To do a breast self-exam: Look for Changes  1. Remove all the clothing above your waist. 2. Stand in front of a mirror in a room with good lighting. 3. Put your hands on your hips. 4. Push your hands firmly downward. 5. Compare your breasts in the mirror. Look for differences between them (asymmetry), such as: ? Differences in shape. ? Differences in size. ? Puckers, dips, and bumps in one breast and not the other. 6. Look at each breast for changes in your skin, such as: ? Redness. ? Scaly areas. 7. Look for changes in your nipples, such as: ? Discharge. ? Bleeding. ? Dimpling. ? Redness. ? A change in position. Feel for Changes Carefully feel your breasts for lumps and changes. It is best to do this while lying on your back on the floor and again while sitting or standing in the shower or tub with soapy water on your skin. Feel each breast in the following way:  Place the arm on the side of the breast you are examining above your head.  Feel your breast with the other hand.  Start in the nipple area and make  inch (2 cm) overlapping circles to feel your breast. Use the pads of your three middle fingers to do this. Apply light pressure, then medium pressure, then firm pressure. The light pressure will allow you to feel the tissue closest to the skin. The medium pressure will allow you to feel the tissue that is a little deeper. The firm pressure will allow you to feel the tissue  close to the ribs.  Continue the overlapping circles, moving downward over the breast until you feel your ribs below your breast.  Move one finger-width toward the center of the body. Continue to use the  inch (2 cm) overlapping circles to feel your breast as you move slowly up toward your collarbone.  Continue the up and down exam using all three pressures until you reach your armpit.  Write Down What You Find  Write down what is normal for each breast and any changes that you find. Keep a written record with breast changes or normal findings for each breast. By writing this information down, you do not need to depend only on memory for size, tenderness, or location. Write down where you are in your menstrual cycle, if you are still menstruating. If you are having trouble noticing differences   in your breasts, do not get discouraged. With time you will become more familiar with the variations in your breasts and more comfortable with the exam. How often should I examine my breasts? Examine your breasts every month. If you are breastfeeding, the best time to examine your breasts is after a feeding or after using a breast pump. If you menstruate, the best time to examine your breasts is 5-7 days after your period is over. During your period, your breasts are lumpier, and it may be more difficult to notice changes. When should I see my health care provider? See your health care provider if you notice:  A change in shape or size of your breasts or nipples.  A change in the skin of your breast or nipples, such as a reddened or scaly area.  Unusual discharge from your nipples.  A lump or thick area that was not there before.  Pain in your breasts.  Anything that concerns you.  Osteoporosis  Osteoporosis is thinning and loss of density in your bones. Osteoporosis makes bones more brittle and fragile and more likely to break (fracture). Over time, osteoporosis can cause your bones to become  so weak that they fracture after a minor fall. Bones in the hip, wrist, and spine are most likely to fracture due to osteoporosis. What are the causes? The exact cause of this condition is not known. What increases the risk? You may be at greater risk for osteoporosis if you:  Have a family history of the condition.  Have poor nutrition.  Use steroid medicines, such as prednisone.  Are female.  Are age 90 or older.  Smoke or have a history of smoking.  Are not physically active (are sedentary).  Are white (Caucasian) or of Asian descent.  Have a small body frame.  Take certain medicines, such as antiseizure medicines. What are the signs or symptoms? A fracture might be the first sign of osteoporosis, especially if the fracture results from a fall or injury that usually would not cause a bone to break. Other signs and symptoms include:  Pain in the neck or low back.  Stooped posture.  Loss of height. How is this diagnosed? This condition may be diagnosed based on:  Your medical history.  A physical exam.  A bone mineral density test, also called a DXA or DEXA test (dual-energy X-ray absorptiometry test). This test uses X-rays to measure the amount of minerals in your bones. How is this treated? The goal of treatment is to strengthen your bones and lower your risk for a fracture. Treatment may involve:  Making lifestyle changes, such as: ? Including foods with more calcium and vitamin D in your diet. ? Doing weight-bearing and muscle-strengthening exercises. ? Stopping tobacco use. ? Limiting alcohol intake.  Taking medicine to slow the process of bone loss or to increase bone density.  Taking daily supplements of calcium and vitamin D.  Taking hormone replacement medicines, such as estrogen for women and testosterone for men.  Monitoring your levels of calcium and vitamin D. Follow these instructions at home:  Activity  Exercise as told by your health care  provider. Ask your health care provider what exercises and activities are safe for you. You should do: ? Exercises that make you work against gravity (weight-bearing exercises), such as tai chi, yoga, or walking. ? Exercises to strengthen muscles, such as lifting weights. Lifestyle  Limit alcohol intake to no more than 1 drink a day for nonpregnant women and 2  drinks a day for men. One drink equals 12 oz of beer, 5 oz of wine, or 1 oz of hard liquor.  Do not use any products that contain nicotine or tobacco, such as cigarettes and e-cigarettes. If you need help quitting, ask your health care provider. Preventing falls  Use devices to help you move around (mobility aids) as needed, such as canes, walkers, scooters, or crutches.  Keep rooms well-lit and clutter-free.  Remove tripping hazards from walkways, including cords and throw rugs.  Install grab bars in bathrooms and safety rails on stairs.  Use rubber mats in the bathroom and other areas that are often wet or slippery.  Wear closed-toe shoes that fit well and support your feet. Wear shoes that have rubber soles or low heels.  Review your medicines with your health care provider. Some medicines can cause dizziness or changes in blood pressure, which can increase your risk of falling. General instructions  Include calcium and vitamin D in your diet. Calcium is important for bone health, and vitamin D helps your body to absorb calcium. Good sources of calcium and vitamin D include: ? Certain fatty fish, such as salmon and tuna. ? Products that have calcium and vitamin D added to them (fortified products), such as fortified cereals. ? Egg yolks. ? Cheese. ? Liver.  Take over-the-counter and prescription medicines only as told by your health care provider.  Keep all follow-up visits as told by your health care provider. This is important. Contact a health care provider if:  You have never been screened for osteoporosis and you  are: ? A woman who is age 61 or older. ? A man who is age 4 or older. Get help right away if:  You fall or injure yourself. Summary  Osteoporosis is thinning and loss of density in your bones. This makes bones more brittle and fragile and more likely to break (fracture),even with minor falls.  The goal of treatment is to strengthen your bones and reduce your risk for a fracture.  Include calcium and vitamin D in your diet. Calcium is important for bone health, and vitamin D helps your body to absorb calcium.  Talk with your health care provider about screening for osteoporosis if you are a woman who is age 5 or older, or a man who is age 1 or older. This information is not intended to replace advice given to you by your health care provider. Make sure you discuss any questions you have with your health care provider. Document Revised: 07/21/2017 Document Reviewed: 06/02/2017 Elsevier Patient Education  [place].   Generalized Anxiety Disorder, Adult Generalized anxiety disorder (GAD) is a mental health disorder. People with this condition constantly worry about everyday events. Unlike normal anxiety, worry related to GAD is not triggered by a specific event. These worries also do not fade or get better with time. GAD interferes with life functions, including relationships, work, and school. GAD can vary from mild to severe. People with severe GAD can have intense waves of anxiety with physical symptoms (panic attacks). What are the causes? The exact cause of GAD is not known. What increases the risk? This condition is more likely to develop in:  Women.  People who have a family history of anxiety disorders.  People who are very shy.  People who experience very stressful life events, such as the death of a loved one.  People who have a very stressful family environment. What are the signs or symptoms? People with  GAD often worry excessively about many things in their  lives, such as their health and family. They may also be overly concerned about:  Doing well at work.  Being on time.  Natural disasters.  Friendships. Physical symptoms of GAD include:  Fatigue.  Muscle tension or having muscle twitches.  Trembling or feeling shaky.  Being easily startled.  Feeling like your heart is pounding or racing.  Feeling out of breath or like you cannot take a deep breath.  Having trouble falling asleep or staying asleep.  Sweating.  Nausea, diarrhea, or irritable bowel syndrome (IBS).  Headaches.  Trouble concentrating or remembering facts.  Restlessness.  Irritability. How is this diagnosed? Your health care provider can diagnose GAD based on your symptoms and medical history. You will also have a physical exam. The health care provider will ask specific questions about your symptoms, including how severe they are, when they started, and if they come and go. Your health care provider may ask you about your use of alcohol or drugs, including prescription medicines. Your health care provider may refer you to a mental health specialist for further evaluation. Your health care provider will do a thorough examination and may perform additional tests to rule out other possible causes of your symptoms. To be diagnosed with GAD, a person must have anxiety that:  Is out of his or her control.  Affects several different aspects of his or her life, such as work and relationships.  Causes distress that makes him or her unable to take part in normal activities.  Includes at least three physical symptoms of GAD, such as restlessness, fatigue, trouble concentrating, irritability, muscle tension, or sleep problems. Before your health care provider can confirm a diagnosis of GAD, these symptoms must be present more days than they are not, and they must last for six months or longer. How is this treated? The following therapies are usually used to treat  GAD:  Medicine. Antidepressant medicine is usually prescribed for long-term daily control. Antianxiety medicines may be added in severe cases, especially when panic attacks occur.  Talk therapy (psychotherapy). Certain types of talk therapy can be helpful in treating GAD by providing support, education, and guidance. Options include: ? Cognitive behavioral therapy (CBT). People learn coping skills and techniques to ease their anxiety. They learn to identify unrealistic or negative thoughts and behaviors and to replace them with positive ones. ? Acceptance and commitment therapy (ACT). This treatment teaches people how to be mindful as a way to cope with unwanted thoughts and feelings. ? Biofeedback. This process trains you to manage your body's response (physiological response) through breathing techniques and relaxation methods. You will work with a therapist while machines are used to monitor your physical symptoms.  Stress management techniques. These include yoga, meditation, and exercise. A mental health specialist can help determine which treatment is best for you. Some people see improvement with one type of therapy. However, other people require a combination of therapies. Follow these instructions at home:  Take over-the-counter and prescription medicines only as told by your health care provider.  Try to maintain a normal routine.  Try to anticipate stressful situations and allow extra time to manage them.  Practice any stress management or self-calming techniques as taught by your health care provider.  Do not punish yourself for setbacks or for not making progress.  Try to recognize your accomplishments, even if they are small.  Keep all follow-up visits as told by your health care provider. This  is important. Contact a health care provider if:  Your symptoms do not get better.  Your symptoms get worse.  You have signs of depression, such as: ? A persistently sad, cranky,  or irritable mood. ? Loss of enjoyment in activities that used to bring you joy. ? Change in weight or eating. ? Changes in sleeping habits. ? Avoiding friends or family members. ? Loss of energy for normal tasks. ? Feelings of guilt or worthlessness. Get help right away if:  You have serious thoughts about hurting yourself or others. If you ever feel like you may hurt yourself or others, or have thoughts about taking your own life, get help right away. You can go to your nearest emergency department or call:  Your local emergency services (911 in the [place]  A suicide crisis helpline, such as the [facility] at 970-054-0371. This is open 24 hours a day. Summary  Generalized anxiety disorder (GAD) is a mental health disorder that involves worry that is not triggered by a specific event.  People with GAD often worry excessively about many things in their lives, such as their health and family.  GAD may cause physical symptoms such as restlessness, trouble concentrating, sleep problems, frequent sweating, nausea, diarrhea, headaches, and trembling or muscle twitching.  A mental health specialist can help determine which treatment is best for you. Some people see improvement with one type of therapy. However, other people require a combination of therapies. This information is not intended to replace advice given to you by your health care provider. Make sure you discuss any questions you have with your health care provider. Document Revised: 07/21/2017 Document Reviewed: 06/28/2016 Elsevier Patient Education  2020 [employer].

## 2019-10-08 LAB — COMPREHENSIVE METABOLIC PANEL
ALT: 15 IU/L (ref 0–32)
AST: 16 IU/L (ref 0–40)
Albumin/Globulin Ratio: 1.8 (ref 1.2–2.2)
Albumin: 4.1 g/dL (ref 3.8–4.9)
Alkaline Phosphatase: 63 IU/L (ref 39–117)
BUN/Creatinine Ratio: 17 (ref 9–23)
BUN: 13 mg/dL (ref 6–24)
Bilirubin Total: 0.3 mg/dL (ref 0.0–1.2)
CO2: 25 mmol/L (ref 20–29)
Calcium: 8.8 mg/dL (ref 8.7–10.2)
Chloride: 102 mmol/L (ref 96–106)
Creatinine, Ser: 0.76 mg/dL (ref 0.57–1.00)
GFR calc Af Amer: 100 mL/min/{1.73_m2} (ref >59)
GFR calc non Af Amer: 87 mL/min/{1.73_m2} (ref >59)
Globulin, Total: 2.3 g/dL (ref 1.5–4.5)
Glucose: 88 mg/dL (ref 65–99)
Potassium: 4.5 mmol/L (ref 3.5–5.2)
Sodium: 140 mmol/L (ref 134–144)
Total Protein: 6.4 g/dL (ref 6.0–8.5)

## 2019-10-08 LAB — HEMOGLOBIN A1C
Est. average glucose Bld gHb Est-mCnc: 123 mg/dL
Hgb A1c MFr Bld: 5.9 % — ABNORMAL HIGH (ref 4.8–5.6)

## 2019-10-08 LAB — CBC
Hematocrit: 41 % (ref 34.0–46.6)
Hemoglobin: 13.8 g/dL (ref 11.1–15.9)
MCH: 32.7 pg (ref 26.6–33.0)
MCHC: 33.7 g/dL (ref 31.5–35.7)
MCV: 97 fL (ref 79–97)
Platelets: 277 10*3/uL (ref 150–450)
RBC: 4.22 x10E6/uL (ref 3.77–5.28)
RDW: 14.3 % (ref 11.7–15.4)
WBC: 5.9 10*3/uL (ref 3.4–10.8)

## 2019-10-08 LAB — LIPID PANEL
Chol/HDL Ratio: 2.7 ratio (ref 0.0–4.4)
Cholesterol, Total: 172 mg/dL (ref 100–199)
HDL: 64 mg/dL (ref >39)
LDL Chol Calc (NIH): 89 mg/dL (ref 0–99)
Triglycerides: 105 mg/dL (ref 0–149)
VLDL Cholesterol Cal: 19 mg/dL (ref 5–40)

## 2019-10-08 LAB — VITAMIN D 25 HYDROXY (VIT D DEFICIENCY, FRACTURES): Vit D, 25-Hydroxy: 21.3 ng/mL — ABNORMAL LOW (ref 30.0–100.0)

## 2019-10-09 LAB — NUSWAB VAGINITIS (VG)
Candida albicans, NAA: NEGATIVE
Candida glabrata, NAA: NEGATIVE
Trich vag by NAA: NEGATIVE

## 2019-10-10 ENCOUNTER — Other Ambulatory Visit: Payer: Self-pay | Admitting: Obstetrics and Gynecology

## 2019-10-10 DIAGNOSIS — M810 Age-related osteoporosis without current pathological fracture: Secondary | ICD-10-CM

## 2019-10-15 ENCOUNTER — Telehealth: Payer: Self-pay

## 2019-10-15 NOTE — Telephone Encounter (Signed)
Received PA for Estradiol vag tablets on 10/09/2019.   PA approved on 10/10/2019  Case # [id number] from 10/10/2019-10/09/2020.  Signed by Dr Talbert Nan and faxed back to [facility].   Pt called and left detailed message on mobile number that PA was approved and to check with pharmacy for Rx. Pt to return call to office if any questions.   Routing to Dr Talbert Nan for review and will close encounter.

## 2019-11-13 ENCOUNTER — Encounter: Payer: 59 | Admitting: Gastroenterology

## 2019-11-18 ENCOUNTER — Other Ambulatory Visit: Payer: Self-pay

## 2019-11-18 ENCOUNTER — Telehealth (INDEPENDENT_AMBULATORY_CARE_PROVIDER_SITE_OTHER): Payer: 59 | Admitting: Obstetrics and Gynecology

## 2019-11-18 ENCOUNTER — Encounter: Payer: Self-pay | Admitting: Obstetrics and Gynecology

## 2019-11-18 DIAGNOSIS — Z79899 Other long term (current) drug therapy: Secondary | ICD-10-CM

## 2019-11-18 DIAGNOSIS — N952 Postmenopausal atrophic vaginitis: Secondary | ICD-10-CM | POA: Diagnosis not present

## 2019-11-18 DIAGNOSIS — Z8659 Personal history of other mental and behavioral disorders: Secondary | ICD-10-CM

## 2019-11-18 MED ORDER — CITALOPRAM HYDROBROMIDE 10 MG PO TABS: 10.0000 mg | ORAL_TABLET | Freq: Every day | ORAL | 3 refills | Status: DC

## 2019-11-18 NOTE — Progress Notes (Signed)
Virtual Visit via Video Note  I connected with Bonnie Ward on 11/18/19 at  3:00 PM EDT by a video enabled telemedicine application and verified that I am speaking with the correct person using two identifiers.  Location: Patient: Home Provider: Office at [facility]   I discussed the limitations of evaluation and management by telemedicine and the availability of in person appointments. The patient expressed understanding and agreed to proceed.  GYNECOLOGY  VISIT   HPI: 59 y.o.   Divorced White or Caucasian Not Hispanic or Latino  female   [place] with Patient's last menstrual period was 01/25/2011.   here for a virtual visit She has a h/o ER/PR positive breast cancer. S/P lumpectomy, SLN biopsy, chemo and radiation. On tamoxifen since 1/17.    She was previously on vaginal estrogen, lost her insurance and had to go off. Last month she was restarted on vaginal estrogen (Dr Jana Hakim okay with her using vaginal estrogen while on tamoxifen). She was previously unable to be sexually active secondary to atrophy and dyspareunia. She briefly tried vaginal dilators previously, too uncomfortable.    She is feeling more comfortable with the vaginal estrogen. She doesn't have a partner right now.  Last month she was also started on 10 mg of daily Celexa for anxiety. She previously hasn't tolerated effexor, zoloft or prozac.  She is doing great! After 2 weeks of taking the Celexa she was feeling forgetful. She then went to 1/2 a tablet for a week, felt great. She is taking 3/4 of a 10 mg tablet and is great. Doesn't feel she needs the full 10 mg tablet.  She has had slight diarrhea and dry mouth. Much better than when she was on the other SSRI's. No anxiety, OCD is better, has lost 5 lbs.   GYNECOLOGIC HISTORY: Patient's last menstrual period was 01/25/2011. Contraception: none, PMP Menopausal hormone therapy: Vaginal estrogen, on Tamoxifen        OB History    Gravida  0   Para  0   Term  0   Preterm  0   AB  0   Living  0     SAB  0   TAB  0   Ectopic  0   Multiple  0   Live Births  0              Patient Active Problem List   Diagnosis Date Noted  . Chest pain 03/07/2016  . CHF NYHA class I ([place]) 09/10/2015  . Chemotherapy adverse reaction 09/01/2015  . Hot flashes 06/04/2015  . Chemotherapy-induced neuropathy ([place]) 05/28/2015  . Fatigue 05/08/2015  . Rash 04/23/2015  . Antineoplastic chemotherapy induced anemia 04/23/2015  . Insomnia 04/23/2015  . Postmenopausal hormone replacement therapy 04/13/2015  . Drug rash 04/10/2015  . Mucositis due to chemotherapy 03/27/2015  . Constipation 03/13/2015  . Heartburn 03/13/2015  . Malignant neoplasm of upper-outer quadrant of right breast in female, estrogen receptor positive ([place]) 12/31/2014  . Hormone replacement therapy (postmenopausal) 10/18/2010  . Arachnoid cyst   . ACID REFLUX DISEASE 07/12/2010  . DYSPHAGIA UNSPECIFIED 07/12/2010  . WRIST PAIN, RIGHT 04/22/2010  . STRESS FRACTURE OF THE METATARSALS 04/22/2010  . BACK PAIN 10/05/2009  . UTI'S, RECURRENT 03/10/2009  . GLAUCOMA, LEFT EYE 01/13/2009  . ALLERGIC RHINITIS 01/13/2009  . HEADACHE 01/13/2009    Past Medical History:  Diagnosis Date  . ACID REFLUX DISEASE 07/12/2010  . Anxiety   . Arachnoid cyst    left  frontal lobe  . Arthritis    hands  . Breast cancer ([place]) 12/2014   right  . Complication of anesthesia 2012   states woke up during colonoscopy with Propofol  . Dental crowns present   . Difficulty swallowing pills   . Glaucoma   . Hypothyroidism   . Migraines   . Nervous stomach   . Personal history of chemotherapy 2016  . Personal history of radiation therapy 2016   Rt breast  . Seasonal allergies   . Stress fracture of foot 12/2014   right    Past Surgical History:  Procedure Laterality Date  . BREAST LUMPECTOMY Right    2016  . COLONOSCOPY WITH PROPOFOL  10/14/2013  .  ESOPHAGOGASTRODUODENOSCOPY ENDOSCOPY    . LASIK    . PORTACATH PLACEMENT Left 01/23/2015   Procedure: INSERTION PORT-A-CATH ;  Surgeon: Fanny Skates, MD;  Location: [place];  Service: General;  Laterality: Left;  . RADIOACTIVE SEED GUIDED PARTIAL MASTECTOMY WITH AXILLARY SENTINEL LYMPH NODE BIOPSY Right 01/23/2015   Procedure: RIGHT PARTIAL MASTECTOMY WITH RADIOACTIVE SEED LOCALIZATION  WITH RIGHT AXILLARY SENTINEL LYMPH NODE BIOPSY;  Surgeon: Fanny Skates, MD;  Location: [place];  Service: General;  Laterality: Right;  . RE-EXCISION OF BREAST LUMPECTOMY Right 02/16/2015   Procedure: RE-EXCISION OF RIGHT  BREAST LUMPECTOMY MARGINS;  Surgeon: Fanny Skates, MD;  Location: [place];  Service: General;  Laterality: Right;  . TONSILLECTOMY  age 66  . TOOTH EXTRACTION      Current Outpatient Medications  Medication Sig Dispense Refill  . citalopram (CELEXA) 10 MG tablet Take 1 tablet (10 mg total) by mouth daily. 30 tablet 1  . Estradiol 10 MCG TABS vaginal tablet Place one tablet vaginally qhs x 7 nights, then change to 2 x a week. 24 tablet 4  . famotidine (PEPCID) 20 MG tablet Take 1 tablet (20 mg total) by mouth at bedtime. 30 tablet 11  . gabapentin (NEURONTIN) 300 MG capsule Take 1 capsule (300 mg total) by mouth at bedtime. 90 capsule 4  . latanoprost (XALATAN) 0.005 % ophthalmic solution 1 drop at bedtime.    . Na Sulfate-K Sulfate-Mg Sulf (SUPREP BOWEL PREP KIT) 17.5-3.13-1.6 GM/177ML SOLN Take 1 kit by mouth as directed. (Patient not taking: Reported on 10/07/2019) 324 mL 0  . Omega-3 Fatty Acids (FISH OIL) 1000 MG CPDR Take 1 capsule by mouth daily.     Marland Kitchen omeprazole (PRILOSEC) 40 MG capsule Take 20-30 minutes before dinner 30 capsule 11  . RESTASIS MULTIDOSE 0.05 % ophthalmic emulsion INT 1 GTT INTO OU BID  3  . tamoxifen (NOLVADEX) 20 MG tablet Take 1 tablet (20 mg total) by mouth daily. 90 tablet 4   No current facility-administered  medications for this visit.     ALLERGIES: Lortab [hydrocodone-acetaminophen], Milk-related compounds, and Coreg [carvedilol]  Family History  Problem Relation Age of Onset  . Osteoporosis Mother   . Irritable bowel syndrome Mother   . Hypertension Mother   . Hypertension Father   . Hyperlipidemia Father   . Diabetes Father   . Dementia Father   . Colon cancer Maternal Grandfather 48  . Breast cancer Paternal Aunt     Social History   Socioeconomic History  . Marital status: Divorced    Spouse name: Not on file  . Number of children: Not on file  . Years of education: Not on file  . Highest education level: Not on file  Occupational History  .  Not on file  Tobacco Use  . Smoking status: Former Smoker    Packs/day: 0.00    Years: 0.00    Pack years: 0.00    Types: Cigarettes    Quit date: 02/19/2005    Years since quitting: 14.7  . Smokeless tobacco: Never Used  Substance and Sexual Activity  . Alcohol use: Yes    Comment: occasionally  . Drug use: No  . Sexual activity: Not Currently    Partners: Male    Birth control/protection: Post-menopausal  Other Topics Concern  . 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 Difficulty of Paying Living Expenses:   Food Insecurity:   . Worried About Charity fundraiser in the Last Year:   . Arboriculturist in the Last Year:   Transportation Needs:   . Film/video editor (Medical):   Marland Kitchen Lack of Transportation (Non-Medical):   Physical Activity:   . Days of Exercise per Week:   . Minutes of Exercise per Session:   Stress:   . Feeling of Stress :   Social Connections:   . Frequency of Communication with Friends and Family:   . Frequency of Social Gatherings with Friends and Family:   . Attends Religious Services:   . Active Member of Clubs or Organizations:   . Attends Theatre manager Meetings:   Marland Kitchen Marital Status:   Intimate Partner Violence:   . Fear of Current or Ex-Partner:   . Emotionally Abused:   Marland Kitchen Physically Abused:   . Sexually Abused:     ROS  PHYSICAL EXAMINATION:    LMP 01/25/2011     General appearance: alert, cooperative and appears stated age  ASSESSMENT H/O breast cancer, on tamoxifen H/O severe vaginal atrophy, feeling better with vaginal estrogen (Oncologist okay with her using vaginal estrogen while on tamoxifen) H/O anxiety, feels great on 7.5 mg of Celexa a day (taking 3/4 of a 10 mg tablet)    PLAN Continue vaginal estrogen If she does want to become sexually active, she will likely need to use the vaginal dilators.  Continue Celexa (will call in the 10 mg tablet)    I discussed the assessment and treatment plan with the patient. The patient was provided an opportunity to ask questions and all were answered. The patient agreed with the plan and demonstrated an understanding of the instructions.   The patient was advised to call back or seek an in-person evaluation if the symptoms worsen or if the condition fails to improve as anticipated.    Salvadore Dom, MD

## 2019-11-21 IMAGING — MG DIGITAL DIAGNOSTIC BILATERAL MAMMOGRAM WITH TOMO AND CAD
9 series · 9 of 25 positions shown · non-contrast
Comparison: 01/30/2017 and earlier

CLINICAL DATA: RIGHT lumpectomy in 0305. Patient was treated with
radiation and chemotherapy.

EXAM:
DIGITAL DIAGNOSTIC BILATERAL MAMMOGRAM WITH CAD AND TOMO

[R CC]
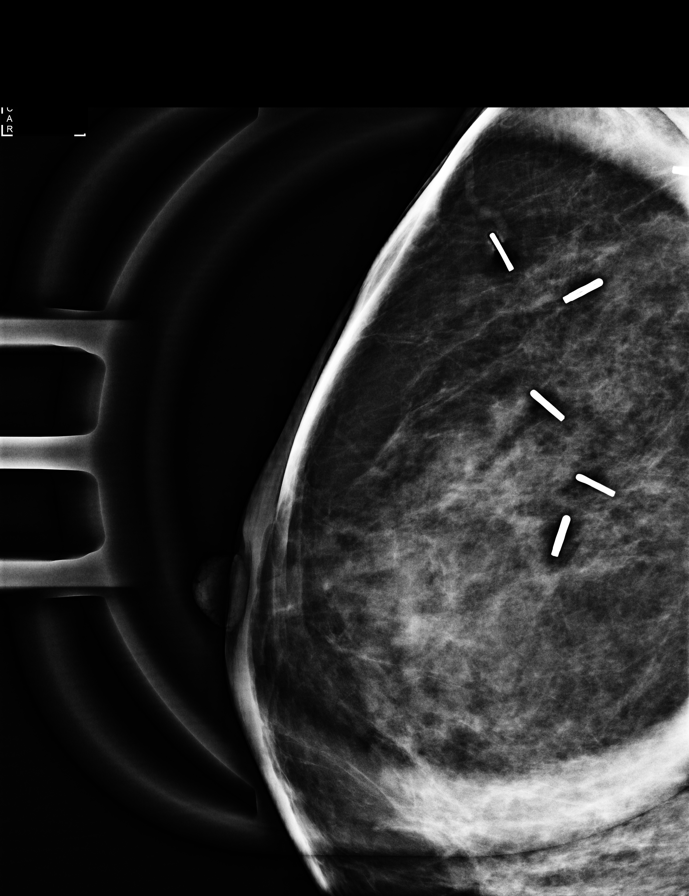

[R CC synth-2D]
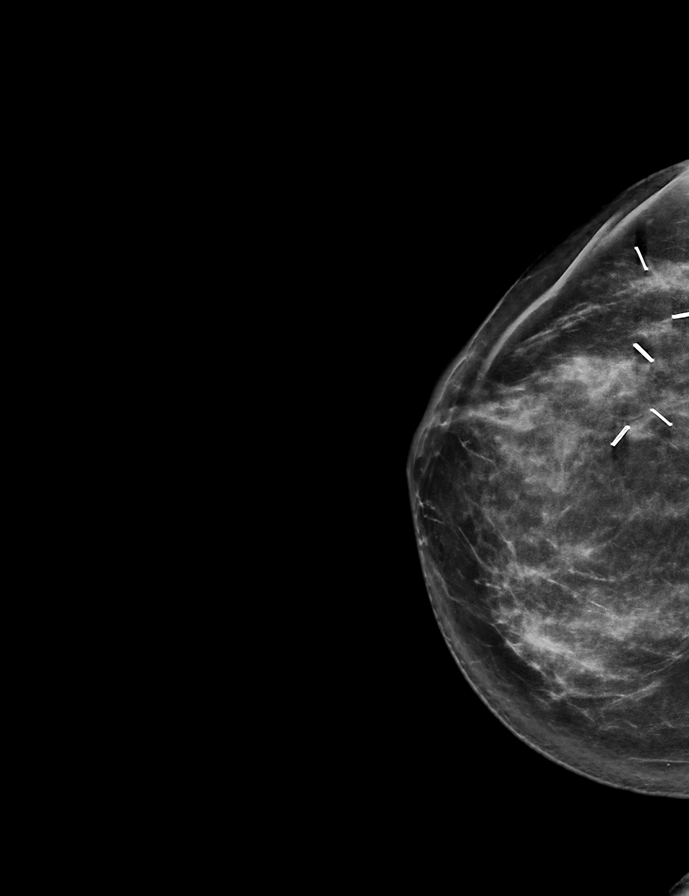

[R MLO synth-2D]
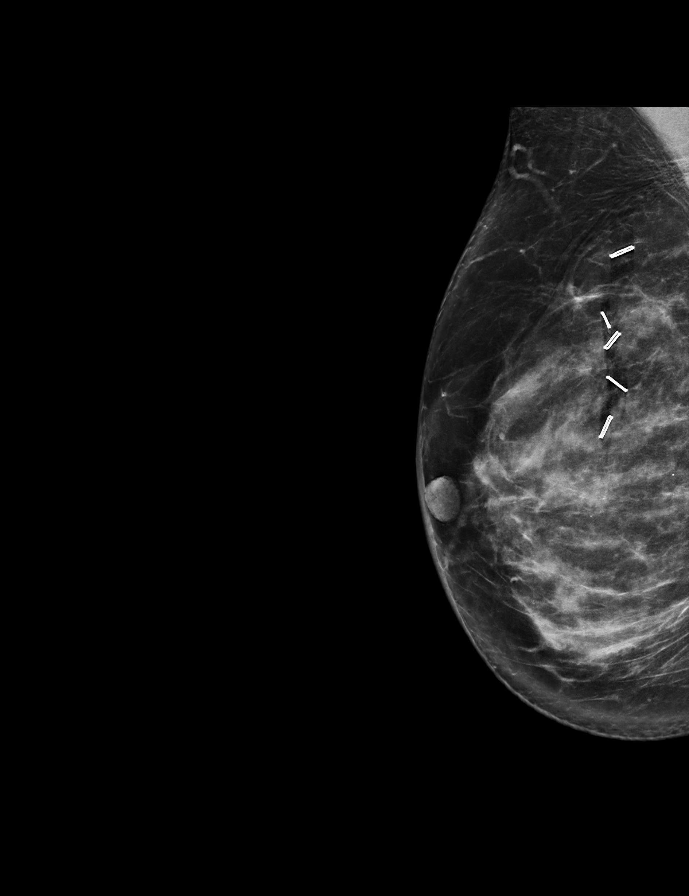

[L CC synth-2D]
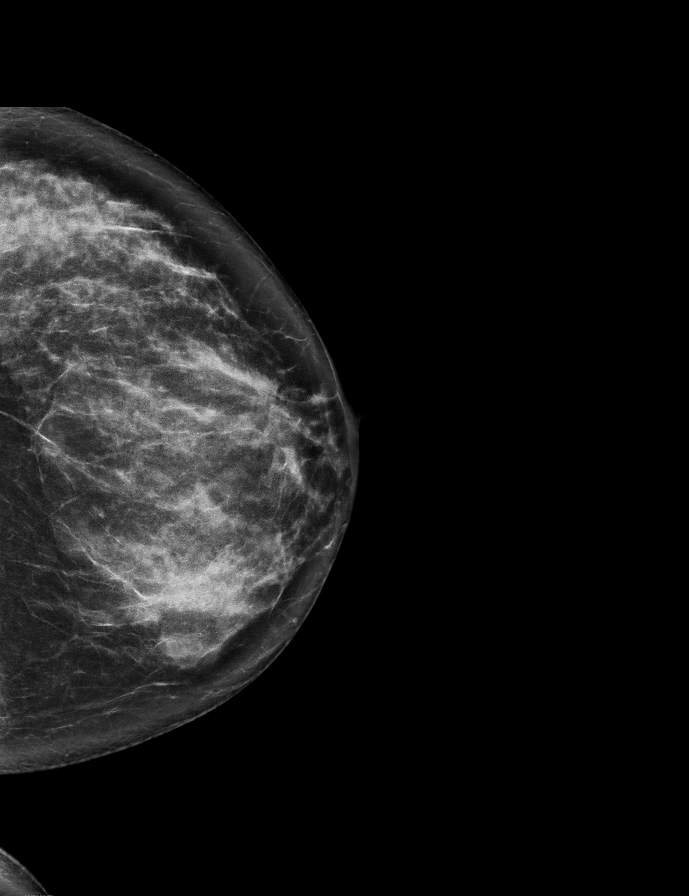

[L MLO synth-2D]
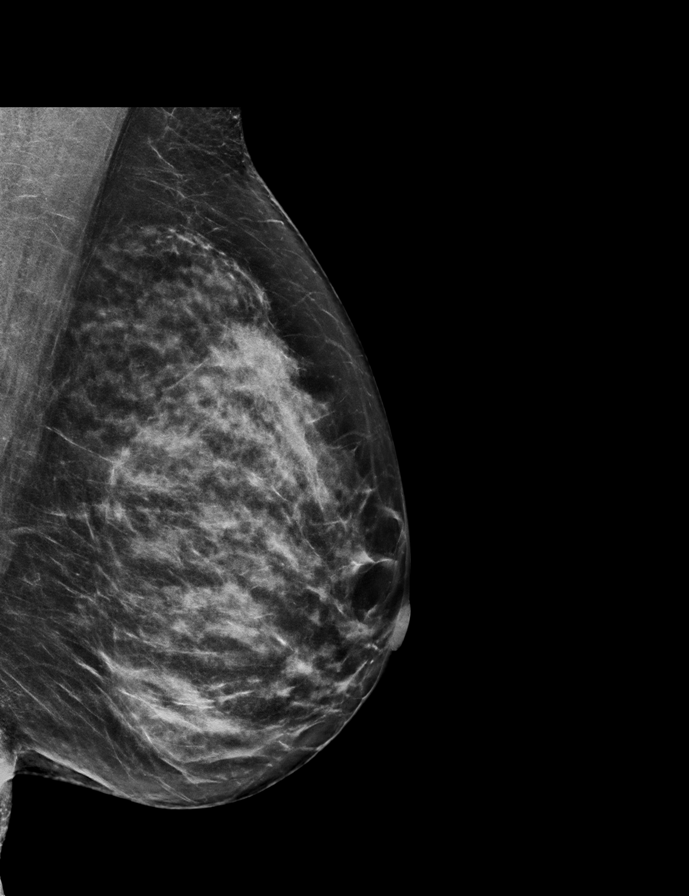

[L MLO tomo · tomo slice 36/71.0]
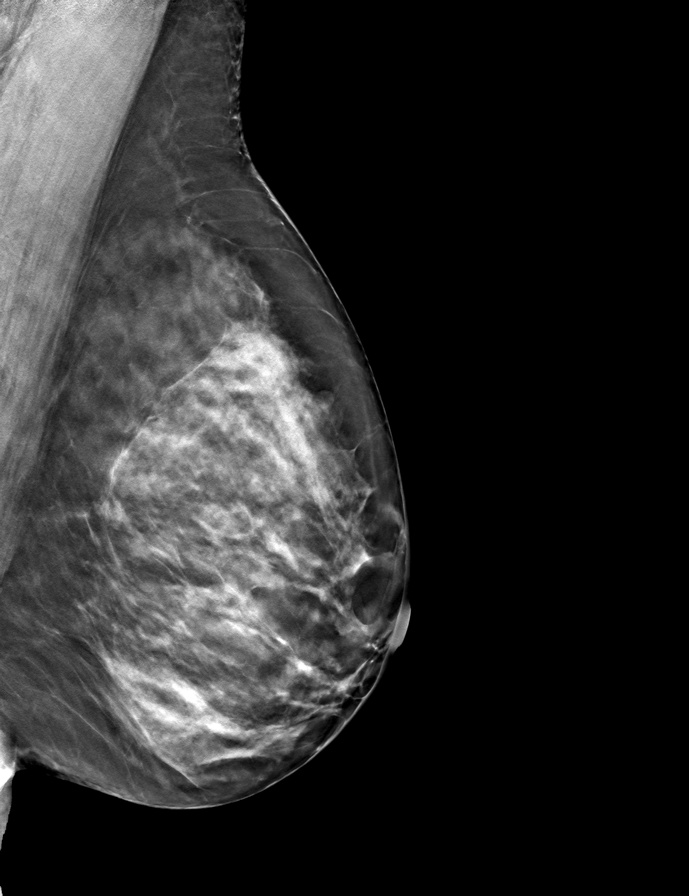

[R MLO tomo · tomo slice 39/77.0]
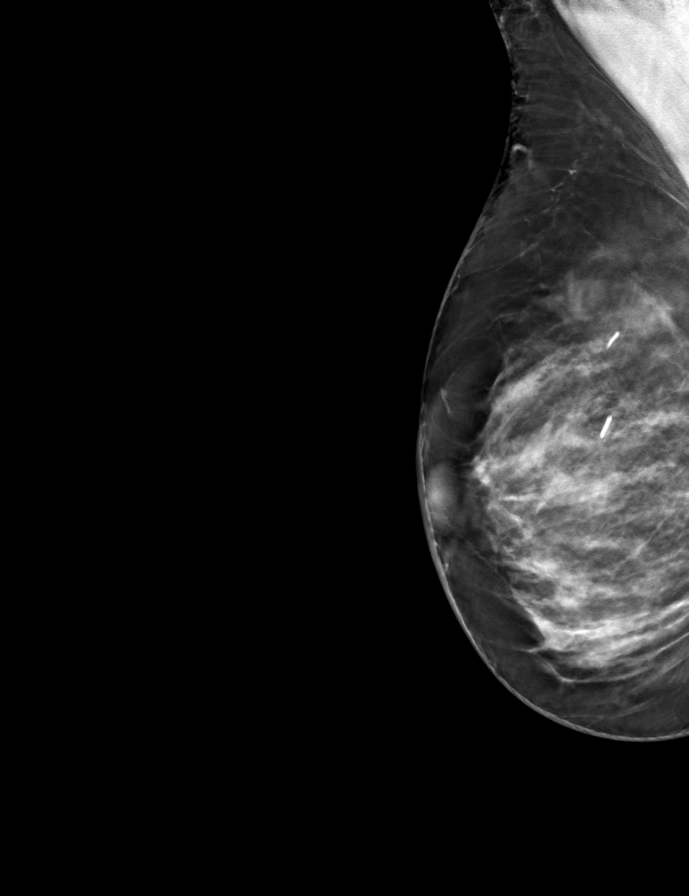

[R CC tomo · tomo slice 37/73.0]
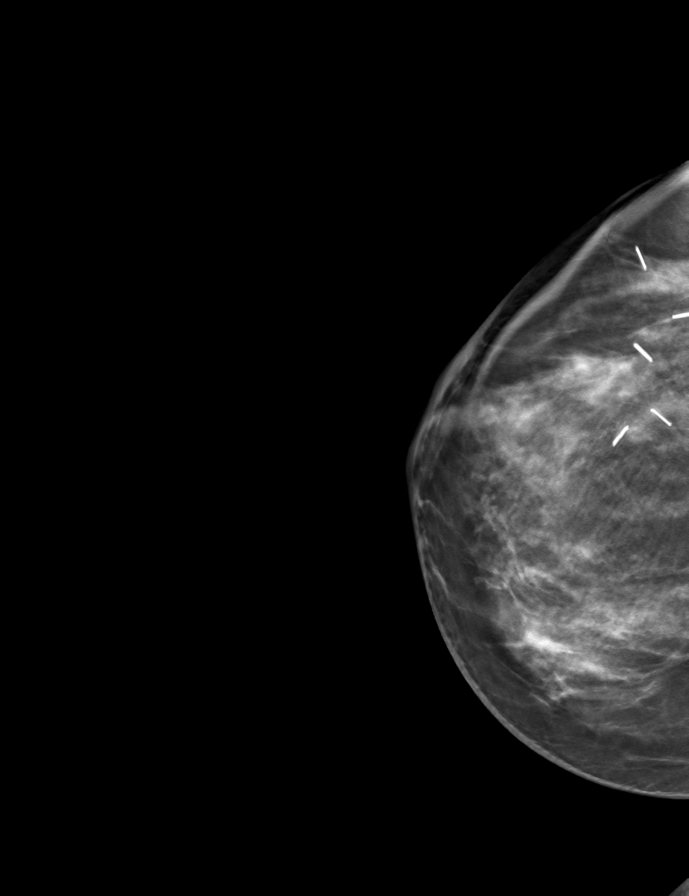

[L CC tomo · tomo slice 35/70.0]
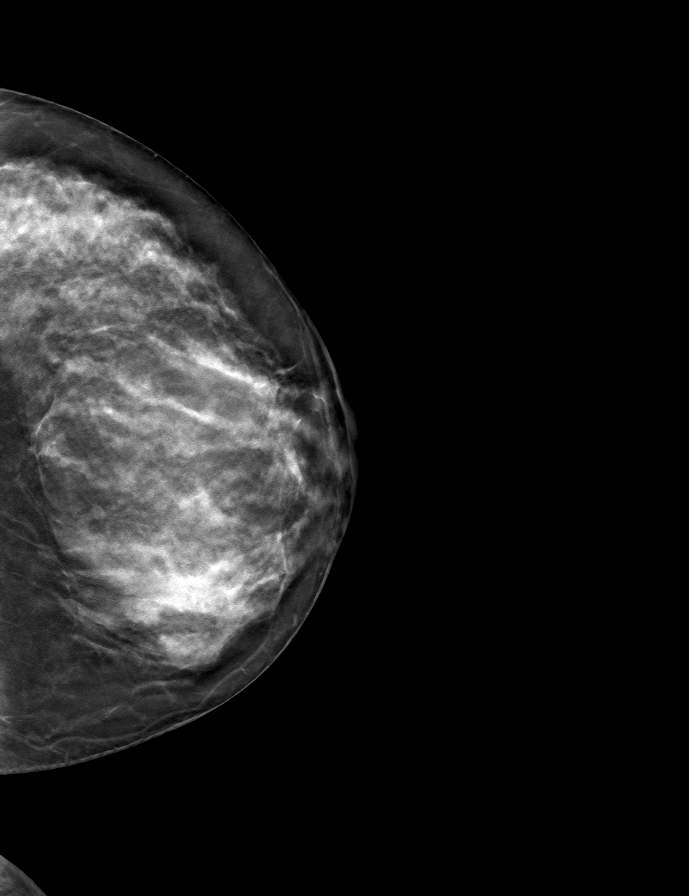

[9 of 25 positions shown; findings below may reference images not displayed]

ACR Breast Density Category c: The breast tissue is heterogeneously
dense, which may obscure small masses.
FINDINGS: Post operative changes are seen in the RIGHTbreast. No suspicious
mass, distortion, or microcalcifications are identified to suggest
presence of malignancy.

Mammographic images were processed with CAD.
IMPRESSION: No mammographic evidence for malignancy.

RECOMMENDATION:
Diagnostic mammogram is suggested in 1 year. (Code:US-L-KQO)

I have discussed the findings and recommendations with the patient.
Results were also provided in writing at the conclusion of the
visit. If applicable, a reminder letter will be sent to the patient
regarding the next appointment.

BI-RADS CATEGORY  2: Benign.

## 2019-12-16 ENCOUNTER — Other Ambulatory Visit: Payer: Self-pay

## 2019-12-16 ENCOUNTER — Ambulatory Visit
Admission: RE | Admit: 2019-12-16 | Discharge: 2019-12-16 | Disposition: A | Discharge status: Home / Self Care | Payer: 59 | Source: Ambulatory Visit | Attending: Obstetrics and Gynecology | Admitting: Obstetrics and Gynecology

## 2019-12-16 DIAGNOSIS — M810 Age-related osteoporosis without current pathological fracture: Secondary | ICD-10-CM

## 2019-12-18 ENCOUNTER — Other Ambulatory Visit: Payer: Self-pay | Admitting: Obstetrics and Gynecology

## 2019-12-18 DIAGNOSIS — E559 Vitamin D deficiency, unspecified: Secondary | ICD-10-CM

## 2019-12-18 DIAGNOSIS — M8580 Other specified disorders of bone density and structure, unspecified site: Secondary | ICD-10-CM

## 2020-02-20 ENCOUNTER — Encounter (HOSPITAL_COMMUNITY): Payer: Self-pay

## 2020-02-20 ENCOUNTER — Other Ambulatory Visit: Payer: Self-pay

## 2020-02-20 ENCOUNTER — Ambulatory Visit (HOSPITAL_COMMUNITY)
Admission: EM | Admit: 2020-02-20 | Discharge: 2020-02-20 | Disposition: A | Discharge status: Home / Self Care | Payer: 59 | Attending: Internal Medicine | Admitting: Internal Medicine

## 2020-02-20 DIAGNOSIS — R197 Diarrhea, unspecified: Secondary | ICD-10-CM | POA: Diagnosis present

## 2020-02-20 DIAGNOSIS — H04203 Unspecified epiphora, bilateral lacrimal glands: Secondary | ICD-10-CM | POA: Insufficient documentation

## 2020-02-20 DIAGNOSIS — R0981 Nasal congestion: Secondary | ICD-10-CM | POA: Diagnosis present

## 2020-02-20 DIAGNOSIS — Z20822 Contact with and (suspected) exposure to covid-19: Secondary | ICD-10-CM | POA: Diagnosis not present

## 2020-02-20 LAB — SARS CORONAVIRUS 2 (TAT 6-24 HRS): SARS Coronavirus 2: NEGATIVE

## 2020-02-20 MED ORDER — AZITHROMYCIN 500 MG PO TABS: 500.0000 mg | ORAL_TABLET | Freq: Every day | ORAL | 0 refills | Status: AC

## 2020-02-20 NOTE — ED Triage Notes (Signed)
Pt presents with n/v/d x1 week after recent travel to [place]. Pt states she has had inesttina parasite previously and GI symptoms feel the same. Pt denies fever, chills, body aches. Pt denies OTC treatment for GI symptoms, or relieving factors.   Pt also complaining of runny nose, cough, itchy watery eyes. Pt sates she took "allergy pill" this afternoon with out relief.

## 2020-02-20 NOTE — Discharge Instructions (Signed)
I have sent azithromycin to your pharmacy  Take 1 tablet twice a day for 3 days  If you are not improving at the end of this course, follow-up with this office or with primary care  Follow-up with the ER if you are unable to keep down any fluids or foods, if you are feeling that you are getting severely dehydrated

## 2020-02-20 NOTE — ED Provider Notes (Signed)
[place]   [id number] 02/20/20 Arrival Time: 1914   CC: COVID symptoms  SUBJECTIVE: History from: patient.  Bonnie Ward is a 59 y.o. female who presents with abrupt onset of nasal congestion, PND, and persistent dry cough for 5 days.  Reports that she just got back from [place] 5 days ago.  Also reports that she has had excessive diarrhea over the last 3 days.  Denies known sick contacts.  Denies fever, chills, fatigue, sinus pain, rhinorrhea, sore throat, SOB, wheezing, chest pain, nausea, changes in bowel or bladder habits.    ROS: As per HPI.  All other pertinent ROS negative.     Past Medical History:  Diagnosis Date  . ACID REFLUX DISEASE 07/12/2010  . Anxiety   . Arachnoid cyst    left frontal lobe  . Arthritis    hands  . Breast cancer ([place]) 12/2014   right  . Complication of anesthesia 2012   states woke up during colonoscopy with Propofol  . Dental crowns present   . Difficulty swallowing pills   . Glaucoma   . Hypothyroidism   . Migraines   . Nervous stomach   . Personal history of chemotherapy 2016  . Personal history of radiation therapy 2016   Rt breast  . Seasonal allergies   . Stress fracture of foot 12/2014   right   Past Surgical History:  Procedure Laterality Date  . BREAST LUMPECTOMY Right    2016  . COLONOSCOPY WITH PROPOFOL  10/14/2013  . ESOPHAGOGASTRODUODENOSCOPY ENDOSCOPY    . LASIK    . PORTACATH PLACEMENT Left 01/23/2015   Procedure: INSERTION PORT-A-CATH ;  Surgeon: Fanny Skates, MD;  Location: [place];  Service: General;  Laterality: Left;  . RADIOACTIVE SEED GUIDED PARTIAL MASTECTOMY WITH AXILLARY SENTINEL LYMPH NODE BIOPSY Right 01/23/2015   Procedure: RIGHT PARTIAL MASTECTOMY WITH RADIOACTIVE SEED LOCALIZATION  WITH RIGHT AXILLARY SENTINEL LYMPH NODE BIOPSY;  Surgeon: Fanny Skates, MD;  Location: [place];  Service: General;  Laterality: Right;  . RE-EXCISION OF BREAST LUMPECTOMY Right  02/16/2015   Procedure: RE-EXCISION OF RIGHT  BREAST LUMPECTOMY MARGINS;  Surgeon: Fanny Skates, MD;  Location: [place];  Service: General;  Laterality: Right;  . TONSILLECTOMY  age 52  . TOOTH EXTRACTION     Allergies  Allergen Reactions  . Lortab [Hydrocodone-Acetaminophen] Anxiety  . Milk-Related Compounds Diarrhea    GI UPSET  . Coreg [Carvedilol] Other (See Comments)    Fatigue    No current facility-administered medications on file prior to encounter.   Current Outpatient Medications on File Prior to Encounter  Medication Sig Dispense Refill  . citalopram (CELEXA) 10 MG tablet Take 1 tablet (10 mg total) by mouth daily. 90 tablet 3  . Estradiol 10 MCG TABS vaginal tablet Place one tablet vaginally qhs x 7 nights, then change to 2 x a week. 24 tablet 4  . famotidine (PEPCID) 20 MG tablet Take 1 tablet (20 mg total) by mouth at bedtime. 30 tablet 11  . gabapentin (NEURONTIN) 300 MG capsule Take 1 capsule (300 mg total) by mouth at bedtime. 90 capsule 4  . omeprazole (PRILOSEC) 40 MG capsule Take 20-30 minutes before dinner 30 capsule 11  . tamoxifen (NOLVADEX) 20 MG tablet Take 1 tablet (20 mg total) by mouth daily. 90 tablet 4  . latanoprost (XALATAN) 0.005 % ophthalmic solution 1 drop at bedtime.    . Omega-3 Fatty Acids (FISH OIL) 1000 MG CPDR Take 1  capsule by mouth daily.     . RESTASIS MULTIDOSE 0.05 % ophthalmic emulsion INT 1 GTT INTO OU BID  3   Social History   Socioeconomic History  . Marital status: Divorced    Spouse name: Not on file  . Number of children: Not on file  . Years of education: Not on file  . Highest education level: Not on file  Occupational History  . Not on file  Tobacco Use  . Smoking status: Former Smoker    Packs/day: 0.00    Years: 0.00    Pack years: 0.00    Types: Cigarettes    Quit date: 02/19/2005    Years since quitting: 15.0  . Smokeless tobacco: Never Used  Vaping Use  . Vaping Use: Never used  Substance and  Sexual Activity  . Alcohol use: Yes    Comment: occasionally  . Drug use: No  . Sexual activity: Not Currently    Partners: Male    Birth control/protection: Post-menopausal  Other Topics Concern  . 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 Difficulty of Paying Living Expenses:   Food Insecurity:   . Worried About Charity fundraiser in the Last Year:   . Arboriculturist in the Last Year:   Transportation Needs:   . Film/video editor (Medical):   Marland Kitchen Lack of Transportation (Non-Medical):   Physical Activity:   . Days of Exercise per Week:   . Minutes of Exercise per Session:   Stress:   . Feeling of Stress :   Social Connections:   . Frequency of Communication with Friends and Family:   . Frequency of Social Gatherings with Friends and Family:   . Attends Religious Services:   . Active Member of Clubs or Organizations:   . Attends Archivist Meetings:   Marland Kitchen Marital Status:   Intimate Partner Violence:   . Fear of Current or Ex-Partner:   . Emotionally Abused:   Marland Kitchen Physically Abused:   . Sexually Abused:    Family History  Problem Relation Age of Onset  . Osteoporosis Mother   . Irritable bowel syndrome Mother   . Hypertension Mother   . Hypertension Father   . Hyperlipidemia Father   . Diabetes Father   . Dementia Father   . Colon cancer Maternal Grandfather 93  . Breast cancer Paternal Aunt     OBJECTIVE:  Vitals:   02/20/20 1710  BP: 125/79  Pulse: 79  Resp: 16  Temp: 98.2 F (36.8 C)  TempSrc: Oral  SpO2: 100%     General appearance: alert; appears fatigued, but nontoxic; speaking in full sentences and tolerating own secretions HEENT: NCAT; Ears: EACs clear, TMs pearly gray; Eyes: PERRL.  EOM grossly intact. Sinuses: nontender; Nose: nares patent without rhinorrhea, Throat: oropharynx clear, tonsils non  erythematous or enlarged, uvula midline  Neck: supple without LAD Lungs: unlabored respirations, symmetrical air entry; cough: mild; no respiratory distress; CTAB Heart: regular rate and rhythm.  Radial pulses 2+ symmetrical bilaterally Skin: warm and dry Psychological: alert and cooperative; normal mood and affect  LABS:  No results found for this or any previous visit (from the past 24 hour(s)).   ASSESSMENT & PLAN:  1. Diarrhea, unspecified type   2. Nasal congestion   3. Watery  eyes     Meds ordered this encounter  Medications  . azithromycin (ZITHROMAX) 500 MG tablet    Sig: Take 1 tablet (500 mg total) by mouth daily for 3 days.    Dispense:  3 tablet    Refill:  0    Order Specific Question:   Supervising Provider    Answer:   Chase Picket [id number]    Prescribe azithromycin 500mg  once daily for 3 days If diarrhea is still persisting, follow-up with his office or with primary care COVID testing ordered.  It will take between 1-2 days for test results.  Someone will contact you regarding abnormal results.    Patient should remain in quarantine until they have received Covid results.  If negative you may resume normal activities (go back to work/school) while practicing hand hygiene, social distance, and mask wearing.  If positive, patient should remain in quarantine for 10 days from symptom onset AND greater than 72 hours after symptoms resolution (absence of fever without the use of fever-reducing medication and improvement in respiratory symptoms), whichever is longer Get plenty of rest and push fluids Use OTC zyrtec for nasal congestion, runny nose, and/or sore throat Use OTC flonase for nasal congestion and runny nose Use medications daily for symptom relief Use OTC medications like ibuprofen or tylenol as needed fever or pain Call or go to the ED if you have any new or worsening symptoms such as fever, worsening cough, shortness of breath, chest tightness, chest  pain, turning blue, changes in mental status.  Reviewed expectations re: course of current medical issues. Questions answered. Outlined signs and symptoms indicating need for more acute intervention. Patient verbalized understanding. After Visit Summary given.         Faustino Congress, NP 02/20/20 1736

## 2020-03-30 NOTE — Progress Notes (Signed)
[facility]  [phone] Fax:([phone]) [phone]    ID: Bonnie Ward DOB: 1960-10-08  MR#: [id number]  IDP#:[id number]  Patient Care Team: Patient, No Pcp Per as PCP - General (General Practice) Magrinat, Virgie Dad, MD as Consulting Physician (Oncology) Salvadore Dom, MD as Consulting Physician (Obstetrics and Gynecology) Verner Chol, MD as Consulting Physician (Sports Medicine) Calvert Cantor, MD as Consulting Physician (Ophthalmology) Thurman Coyer, DO as Consulting Physician (Sports Medicine) OTHER MD:  CHIEF COMPLAINT: Estrogen receptor positive breast cancer  CURRENT TREATMENT: tamoxifen   INTERVAL HISTORY: Bonnie Ward returns today for follow-up of her estrogen receptor positive breast cancer.   She continues on tamoxifen, with good tolerance.  She thinks the changes in her fingers that she notices might be related to tamoxifen but of course they really are related to osteoarthritis.  Since her last visit, she underwent bilateral diagnostic mammography with tomography at [facility] on 09/16/2019 showing: breast density category C; no evidence of malignancy in either breast.  She also underwent bone density screening on 12/16/2019 showing a T-score of -2.4, which is considered osteopenic. This is slightly improved from -2.5 in 01/2017.   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  She has this alone in her home now.  She has been vaccinated with Mertens and did well with that.  She has not been asking her clients to mask but she may go back to that given the problems with the new variant.  She was started on Celexa by her gynecologist and has had a wonderful response, with much less anxiety, better sleep, and general improvement except sometimes she finds it hard to remember things.  She is exercising by walking her dogs twice a day, teaching Pilates, and using her Peloton.  A detailed review of systems today was otherwise  stable    BREAST CANCER HISTORY: From the initial intake note:  Paytan had routine bilateral screening mammography at the [facility] for [facility] 01/09/2015 showing a possible mass in the right breast. On 12/22/2014 she underwent diagnostic right mammography with tomosynthesis and right breast ultrasonography. The breast density was category C. In the right breast there was an area of increased density with architectural distortion and faint microcalcifications in the upper outer quadrant. There was mild palpable soft tissue thickening in the area in question, but no palpable right axillary lymph nodes. Ultrasound confirmed a 1.7 cm irregular hypoechoic mass. There were no abnormal-appearing right axillary lymph nodes.  Biopsy of the right breast mass in question 12/26/2014 showed ([facility] 16-08/08/2005) an invasive ductal carcinoma, grade 1 or 2, with a prognostic panel still pending.  Her subsequent history is as detailed below   PAST MEDICAL HISTORY: Past Medical History:  Diagnosis Date  . ACID REFLUX DISEASE 07/12/2010  . Anxiety   . Arachnoid cyst    left frontal lobe  . Arthritis    hands  . Breast cancer ([facility] [facility]) 12/2014   right  . Complication of anesthesia 2012   states woke up during colonoscopy with Propofol  . Dental crowns present   . Difficulty swallowing pills   . Glaucoma   . Hypothyroidism   . Migraines   . Nervous stomach   . Personal history of chemotherapy 2016  . Personal history of radiation therapy 2016   Rt breast  . Seasonal allergies   . Stress fracture of foot 12/2014   right    PAST SURGICAL HISTORY: Past Surgical History:  Procedure Laterality Date  .  BREAST LUMPECTOMY Right    2016  . COLONOSCOPY WITH PROPOFOL  10/14/2013  . ESOPHAGOGASTRODUODENOSCOPY ENDOSCOPY    . LASIK    . PORTACATH PLACEMENT Left 01/23/2015   Procedure: INSERTION PORT-A-CATH ;  Surgeon: Fanny Skates, MD;  Location: [facility];  Service: General;  Laterality: Left;    . RADIOACTIVE SEED GUIDED PARTIAL MASTECTOMY WITH AXILLARY SENTINEL LYMPH NODE BIOPSY Right 01/23/2015   Procedure: RIGHT PARTIAL MASTECTOMY WITH RADIOACTIVE SEED LOCALIZATION  WITH RIGHT AXILLARY SENTINEL LYMPH NODE BIOPSY;  Surgeon: Fanny Skates, MD;  Location: [facility];  Service: General;  Laterality: Right;  . RE-EXCISION OF BREAST LUMPECTOMY Right 02/16/2015   Procedure: RE-EXCISION OF RIGHT  BREAST LUMPECTOMY MARGINS;  Surgeon: Fanny Skates, MD;  Location: [facility];  Service: General;  Laterality: Right;  . TONSILLECTOMY  age 59  . TOOTH EXTRACTION      FAMILY HISTORY Family History  Problem Relation Age of Onset  . Osteoporosis Mother   . Irritable bowel syndrome Mother   . Hypertension Mother   . Hypertension Father   . Hyperlipidemia Father   . Diabetes Father   . Dementia Father   . Colon cancer Maternal Grandfather 19  . Breast cancer Paternal Aunt    the patient's father died at age 50 from pneumonia. [name] died at age 42 after bowel perforation. The patient had one brother, one sister. There is no history of breast or ovarian cancer in the family.   GYNECOLOGIC HISTORY:  Patient's last menstrual period was 01/25/2011. Menarche age 51, the patient is GX P0. She stopped having periods approximately 2011. She is still on hormone replacement, but is "trickling off it". She did take birth control pills for approximately 3 years remotely with no complications   SOCIAL HISTORY: (Updated 02/28/2019) Bonnie Ward works as a Haematologist. She is divorced and lives by herself with her 2 dogs, a labradoodle and a maltipoo.    ADVANCED DIRECTIVES: The patient's brother [name] is her healthcare power of attorney. He may be reached in [place] at [place]: Social History   Tobacco Use  . Smoking status: Former Smoker    Packs/day: 0.00    Years: 0.00    Pack years: 0.00    Types: Cigarettes    Quit date:  02/19/2005    Years since quitting: 15.1  . Smokeless tobacco: Never Used  Vaping Use  . Vaping Use: Never used  Substance Use Topics  . Alcohol use: Yes    Comment: occasionally  . Drug use: No     Colonoscopy: February 2016  PAP: 04/2017, negative  Bone density: 11/2019, -2.4   Allergies  Allergen Reactions  . Lortab [Hydrocodone-Acetaminophen] Anxiety  . Milk-Related Compounds Diarrhea    GI UPSET  . Coreg [Carvedilol] Other (See Comments)    Fatigue     Current Outpatient Medications  Medication Sig Dispense Refill  . citalopram (CELEXA) 10 MG tablet Take 1 tablet (10 mg total) by mouth daily. 90 tablet 3  . Estradiol 10 MCG TABS vaginal tablet Place one tablet vaginally qhs x 7 nights, then change to 2 x a week. 24 tablet 4  . famotidine (PEPCID) 20 MG tablet Take 1 tablet (20 mg total) by mouth at bedtime. 30 tablet 11  . gabapentin (NEURONTIN) 300 MG capsule Take 1 capsule (300 mg total) by mouth at bedtime. 90 capsule 4  . latanoprost (XALATAN) 0.005 % ophthalmic solution 1 drop at bedtime.    Marland Kitchen  Omega-3 Fatty Acids (FISH OIL) 1000 MG CPDR Take 1 capsule by mouth daily.     Marland Kitchen omeprazole (PRILOSEC) 40 MG capsule Take 20-30 minutes before dinner 30 capsule 11  . RESTASIS MULTIDOSE 0.05 % ophthalmic emulsion INT 1 GTT INTO OU BID  3  . tamoxifen (NOLVADEX) 20 MG tablet Take 1 tablet (20 mg total) by mouth daily. 90 tablet 4   No current facility-administered medications for this visit.    OBJECTIVE:  white woman in no acute distress  Vitals:   03/31/20 1043  BP: 111/85  Pulse: 80  Resp: 16  Temp: 98.2 F (36.8 C)  SpO2: 100%     Body mass index is 23.34 kg/m.    ECOG FS:0 - Asymptomatic  Sclerae unicteric, EOMs intact Wearing a mask No cervical or supraclavicular adenopathy Lungs no rales or rhonchi Heart regular rate and rhythm Abd soft, nontender, positive bowel sounds MSK no focal spinal tenderness, no upper extremity lymphedema; obvious osteoarthritic  changes over right hand greater than left Neuro: nonfocal, well oriented, appropriate affect Breasts: Status post lumpectomy and radiation on the right; no evidence of local recurrence; left breast on both axillae are benign.   LAB RESULTS:  CMP     Component Value Date/Time   NA 140 10/07/2019 1021   NA 142 12/26/2016 0959   K 4.5 10/07/2019 1021   K 4.0 12/26/2016 0959   CL 102 10/07/2019 1021   CO2 25 10/07/2019 1021   CO2 27 12/26/2016 0959   GLUCOSE 88 10/07/2019 1021   GLUCOSE 93 02/28/2019 1234   GLUCOSE 107 12/26/2016 0959   BUN 13 10/07/2019 1021   BUN 10.4 12/26/2016 0959   CREATININE 0.76 10/07/2019 1021   CREATININE 0.84 02/28/2019 1234   CREATININE 0.8 12/26/2016 0959   CALCIUM 8.8 10/07/2019 1021   CALCIUM 8.8 12/26/2016 0959   PROT 6.4 10/07/2019 1021   PROT 6.8 12/26/2016 0959   ALBUMIN 4.1 10/07/2019 1021   ALBUMIN 3.7 12/26/2016 0959   AST 16 10/07/2019 1021   AST 20 02/28/2019 1234   AST 28 12/26/2016 0959   ALT 15 10/07/2019 1021   ALT 13 02/28/2019 1234   ALT 26 12/26/2016 0959   ALKPHOS 63 10/07/2019 1021   ALKPHOS 78 12/26/2016 0959   BILITOT 0.3 10/07/2019 1021   BILITOT 0.3 02/28/2019 1234   BILITOT 0.40 12/26/2016 0959   GFRNONAA 87 10/07/2019 1021   GFRNONAA >60 02/28/2019 1234   GFRAA 100 10/07/2019 1021   GFRAA >60 02/28/2019 1234    INo results found for: SPEP, UPEP  Lab Results  Component Value Date   WBC 5.3 03/31/2020   NEUTROABS 2.9 03/31/2020   HGB 13.1 03/31/2020   HCT 39.4 03/31/2020   MCV 96.6 03/31/2020   PLT 232 03/31/2020      Chemistry      Component Value Date/Time   NA 140 10/07/2019 1021   NA 142 12/26/2016 0959   K 4.5 10/07/2019 1021   K 4.0 12/26/2016 0959   CL 102 10/07/2019 1021   CO2 25 10/07/2019 1021   CO2 27 12/26/2016 0959   BUN 13 10/07/2019 1021   BUN 10.4 12/26/2016 0959   CREATININE 0.76 10/07/2019 1021   CREATININE 0.84 02/28/2019 1234   CREATININE 0.8 12/26/2016 0959      Component  Value Date/Time   CALCIUM 8.8 10/07/2019 1021   CALCIUM 8.8 12/26/2016 0959   ALKPHOS 63 10/07/2019 1021   ALKPHOS 78 12/26/2016 0959   AST  16 10/07/2019 1021   AST 20 02/28/2019 1234   AST 28 12/26/2016 0959   ALT 15 10/07/2019 1021   ALT 13 02/28/2019 1234   ALT 26 12/26/2016 0959   BILITOT 0.3 10/07/2019 1021   BILITOT 0.3 02/28/2019 1234   BILITOT 0.40 12/26/2016 0959       No results found for: LABCA2  No components found for: LABCA125  No results for input(s): INR in the last 168 hours.  Urinalysis    Component Value Date/Time   COLORURINE yellow 10/05/2009 1454   APPEARANCEUR Clear 10/05/2009 1454   LABSPEC 1.010 10/05/2009 1454   PHURINE 7.0 10/05/2009 1454   HGBUR 2+ 10/05/2009 1454   BILIRUBINUR negative 07/05/2018 0820   PROTEINUR Negative 07/05/2018 0820   UROBILINOGEN 0.2 07/05/2018 0820   UROBILINOGEN 0.2 10/05/2009 1454   NITRITE negative 07/05/2018 0820   NITRITE negative 10/05/2009 1454   LEUKOCYTESUR Moderate (2+) (A) 07/05/2018 0820    STUDIES: No results found.   ASSESSMENT: 59 y.o. [place] woman status post right breastUpper outer quadrant biopsy 12/26/2014 for a clinical T1 cN0, stage IA invasive ductal carcinoma, grade 1 or 2, estrogen and progesterone receptor positive, with an MIB-1 of 11%, and HER-2 amplified, with a signals ratio of 2.07, number per cell 2.28.  (1) status post right lumpectomy and sentinel lymph node sampling 01/23/2015 for a pT1c pN1, stage IIA invasive ductal carcinoma, grade 1, with focally positive anterior margins  (a) additional surgery 02/16/2015 found residual disease but cleared margins  (2) chemotherapy started 03/05/2015, consisting of doxorubicin and cyclophosphamide in dose dense fashion 4, completed 04/16/2015, followed by paclitaxel weekly x 12 planned cycles, started 04/30/2015,  with trastuzumab and pertuzumab given every 3 weeks.  (a) Paclitaxel stopped after 4 cycles because of progressive  neuropathy, last dose 05/28/2015  (b) pertuzumab stopped after 2 cycles (when paclitaxel stopped)  (3) trastuzumab was continued to complete 1 year-- final dose 05/23/2016  (a) echocardiogram 08/19/2016 shows a >10% drop-- trastuzumab held after 07/30/2015 dose  (b) repeat echocardiogram 10/12/2015 shows EF recovery to 55%, trastuzumab resumed 10/22/2015  (c) echocardiogram 03/07/2016 shows a stable ejection fraction  (d) echocardiogram 06/20/2016 shows an ejection fraction of 55%   (4) adjuvant radiation 07/06/2015-08/21/2015::  Right breast / 72 Gray @ 1.8 [place] Cables per fraction x 25 fractions Right supraclavicular fossa/PAB 45 Gy _0 .8 Gy per fraction x 25 fractions Right breast boost / 16 Gray at [employer] per fraction x 8 fractions  (5) started tamoxifen 09/10/2015   PLAN: Bonnie Ward is now a little over 5 years out from definitive surgery for her breast cancer with no evidence of disease recurrence.  This is very favorable.  She will complete 5 years of tamoxifen in December of this year.  She would like to stop the medication.  At the same time of course she is concerned about stopping it.  Reasons to continue tamoxifen are: Further breast cancer risk reduction (but only 1-2%); it helps the bones and she is very close to osteoporosis with a T score of -2.4 on the most recent bone density; and finally it allows her to receive vaginal estrogen safely.  If she does go off tamoxifen she understands we do not have data saying that it is safe to use vaginal estrogens.  We also do not have data saying that it is not safe.  It is something she would have to decide on on the basis of quality of life versus uncertainty and of course she would make  that decision with her gynecologist.  Then could not decide today whether she was ready to "graduate" or not.  At this point I am making her a tentative appointment to see me in a year.  She knows to call for any other issues that may develop before the next  visit  Total encounter time 25 minutes.*  Magrinat, Virgie Dad, MD  03/31/20 10:54 AM Medical Oncology and Hematology [facility] [place], [place] [address] Tel. [phone]    Fax. [phone]   I, Wilburn Mylar, am acting as scribe for Dr. Virgie Dad. Magrinat.  I, Lurline Del MD, have reviewed the above documentation for accuracy and completeness, and I agree with the above.   *Total Encounter Time as defined by the Centers for Medicare and Medicaid Services includes, in addition to the face-to-face time of a patient visit (documented in the note above) non-face-to-face time: obtaining and reviewing outside history, ordering and reviewing medications, tests or procedures, care coordination (communications with other health care professionals or caregivers) and documentation in the medical record.

## 2020-03-31 ENCOUNTER — Inpatient Hospital Stay: Payer: 59 | Attending: Oncology | Admitting: Oncology

## 2020-03-31 ENCOUNTER — Inpatient Hospital Stay: Payer: 59

## 2020-03-31 ENCOUNTER — Other Ambulatory Visit: Payer: Self-pay

## 2020-03-31 VITALS — BP 111/85 | HR 80 | Temp 98.2°F | Resp 16 | Ht 66.0 in | Wt 144.6 lb

## 2020-03-31 DIAGNOSIS — M129 Arthropathy, unspecified: Secondary | ICD-10-CM | POA: Diagnosis not present

## 2020-03-31 DIAGNOSIS — F419 Anxiety disorder, unspecified: Secondary | ICD-10-CM | POA: Insufficient documentation

## 2020-03-31 DIAGNOSIS — Z79899 Other long term (current) drug therapy: Secondary | ICD-10-CM | POA: Diagnosis not present

## 2020-03-31 DIAGNOSIS — Z87891 Personal history of nicotine dependence: Secondary | ICD-10-CM | POA: Insufficient documentation

## 2020-03-31 DIAGNOSIS — C50411 Malignant neoplasm of upper-outer quadrant of right female breast: Secondary | ICD-10-CM | POA: Diagnosis present

## 2020-03-31 DIAGNOSIS — Z170 Estrogen receptor positive status [ER+]: Secondary | ICD-10-CM | POA: Insufficient documentation

## 2020-03-31 DIAGNOSIS — K219 Gastro-esophageal reflux disease without esophagitis: Secondary | ICD-10-CM | POA: Insufficient documentation

## 2020-03-31 DIAGNOSIS — Z923 Personal history of irradiation: Secondary | ICD-10-CM | POA: Diagnosis not present

## 2020-03-31 DIAGNOSIS — Z9221 Personal history of antineoplastic chemotherapy: Secondary | ICD-10-CM | POA: Diagnosis not present

## 2020-03-31 DIAGNOSIS — E039 Hypothyroidism, unspecified: Secondary | ICD-10-CM | POA: Insufficient documentation

## 2020-03-31 DIAGNOSIS — M8580 Other specified disorders of bone density and structure, unspecified site: Secondary | ICD-10-CM | POA: Diagnosis not present

## 2020-03-31 DIAGNOSIS — Z79810 Long term (current) use of selective estrogen receptor modulators (SERMs): Secondary | ICD-10-CM | POA: Insufficient documentation

## 2020-03-31 LAB — CBC WITH DIFFERENTIAL/PLATELET
Abs Immature Granulocytes: 0.01 10*3/uL (ref 0.00–0.07)
Basophils Absolute: 0 10*3/uL (ref 0.0–0.1)
Basophils Relative: 0 %
Eosinophils Absolute: 0.1 10*3/uL (ref 0.0–0.5)
Eosinophils Relative: 2 %
HCT: 39.4 % (ref 36.0–46.0)
Hemoglobin: 13.1 g/dL (ref 12.0–15.0)
Immature Granulocytes: 0 %
Lymphocytes Relative: 34 %
Lymphs Abs: 1.8 10*3/uL (ref 0.7–4.0)
MCH: 32.1 pg (ref 26.0–34.0)
MCHC: 33.2 g/dL (ref 30.0–36.0)
MCV: 96.6 fL (ref 80.0–100.0)
Monocytes Absolute: 0.5 10*3/uL (ref 0.1–1.0)
Monocytes Relative: 10 %
Neutro Abs: 2.9 10*3/uL (ref 1.7–7.7)
Neutrophils Relative %: 54 %
Platelets: 232 10*3/uL (ref 150–400)
RBC: 4.08 MIL/uL (ref 3.87–5.11)
RDW: 13.9 % (ref 11.5–15.5)
WBC: 5.3 10*3/uL (ref 4.0–10.5)
nRBC: 0 % (ref 0.0–0.2)

## 2020-03-31 LAB — COMPREHENSIVE METABOLIC PANEL
ALT: 19 U/L (ref 0–44)
AST: 27 U/L (ref 15–41)
Albumin: 3.9 g/dL (ref 3.5–5.0)
Alkaline Phosphatase: 48 U/L (ref 38–126)
Anion gap: 8 (ref 5–15)
BUN: 18 mg/dL (ref 6–20)
CO2: 28 mmol/L (ref 22–32)
Calcium: 9.6 mg/dL (ref 8.9–10.3)
Chloride: 104 mmol/L (ref 98–111)
Creatinine, Ser: 0.84 mg/dL (ref 0.44–1.00)
GFR calc Af Amer: >60 mL/min (ref >60)
GFR calc non Af Amer: >60 mL/min (ref >60)
Glucose, Bld: 99 mg/dL (ref 70–99)
Potassium: 4.1 mmol/L (ref 3.5–5.1)
Sodium: 140 mmol/L (ref 135–145)
Total Bilirubin: 0.4 mg/dL (ref 0.3–1.2)
Total Protein: 7.1 g/dL (ref 6.5–8.1)

## 2020-03-31 MED ORDER — TAMOXIFEN CITRATE 20 MG PO TABS: 20.0000 mg | ORAL_TABLET | Freq: Every day | ORAL | 4 refills | Status: DC

## 2020-03-31 MED ORDER — GABAPENTIN 300 MG PO CAPS: 300.0000 mg | ORAL_CAPSULE | Freq: Every day | ORAL | 4 refills | Status: DC

## 2020-04-01 ENCOUNTER — Telehealth: Payer: Self-pay | Admitting: Oncology

## 2020-04-01 NOTE — Telephone Encounter (Signed)
Scheduled appts per 8/10 los. Pt confirmed appt date and time.  

## 2020-04-29 ENCOUNTER — Other Ambulatory Visit: Payer: Self-pay | Admitting: Oncology

## 2020-04-29 DIAGNOSIS — Z170 Estrogen receptor positive status [ER+]: Secondary | ICD-10-CM

## 2020-04-29 DIAGNOSIS — C50411 Malignant neoplasm of upper-outer quadrant of right female breast: Secondary | ICD-10-CM

## 2020-07-20 ENCOUNTER — Telehealth: Payer: Self-pay

## 2020-07-20 MED ORDER — CITALOPRAM HYDROBROMIDE 10 MG PO TABS: 15.0000 mg | ORAL_TABLET | Freq: Every day | ORAL | 0 refills | Status: DC

## 2020-07-20 NOTE — Telephone Encounter (Signed)
Spoke with pt. Pt states has been taking 15 mg daily for last 1.5-2 months and doing great and would like to continue. Pt advised can have Rx sent until AEX. Pt verbalized understanding. Rx Celexa 15 mg daily sent to pharmacy on file.  Routing to Dr Talbert Nan for review Encounter closed

## 2020-07-20 NOTE — Telephone Encounter (Signed)
How long has she been on 15 mg of celexa (1.5 10 mg tablets)? How is she doing on it? If she is doing fine, please refill until her next annual exam (due in 2/21).

## 2020-07-20 NOTE — Telephone Encounter (Signed)
Patient calling for refill on Celexa 10 mg. Patient states that she is taking 1.5 pills and is now needing refill. Please advise.

## 2020-10-09 NOTE — Progress Notes (Signed)
60 y.o. G0P0000 Divorced White or Caucasian female here for annual exam.    D/c tamoxifen in December Has increased Celexa, takes 15 mg daily. Feels so much better with this medication, anxiety in control. A little concerned because she feels like she may have some memory problems, but does not want to stop medication.  In a new relationship, difficulty with sex, not sure if she wants to pursuit treatment  Dx breast cancer 2016, has one more visit with oncologist.  Patient's last menstrual period was 01/25/2011.          Sexually active: Yes.    The current method of family planning is post menopausal status.    Exercising: Yes.    pilates Smoker:  no  Health Maintenance: Pap:  05-01-17 neg HPV HR neg History of abnormal Pap:  no MMG:  09-16-19 density c birads 2:neg, hx of right breast cancer Colonoscopy:  10-14-13 f/u 25yrs BMD:   12-16-2019 osteopenia TDaP:  2013 Gardasil:   n/a Covid-19: pfizer Hep C testing: 2017 neg Screening Labs: vit D and A1C today   reports that she quit smoking about 15 years ago. Her smoking use included cigarettes. She smoked 0.00 packs per day for 0.00 years. She has never used smokeless tobacco. She reports current alcohol use. She reports that she does not use drugs.  Past Medical History:  Diagnosis Date  . ACID REFLUX DISEASE 07/12/2010  . Anxiety   . Arachnoid cyst    left frontal lobe  . Arthritis    hands  . Breast cancer ([facility]) 12/2014   right  . Complication of anesthesia 2012   states woke up during colonoscopy with Propofol  . Dental crowns present   . Difficulty swallowing pills   . Glaucoma   . Hypothyroidism   . Migraines   . Nervous stomach   . Personal history of chemotherapy 2016  . Personal history of radiation therapy 2016   Rt breast  . Seasonal allergies   . Stress fracture of foot 12/2014   right    Past Surgical History:  Procedure Laterality Date  . BREAST LUMPECTOMY Right    2016  . COLONOSCOPY WITH PROPOFOL   10/14/2013  . ESOPHAGOGASTRODUODENOSCOPY ENDOSCOPY    . LASIK    . PORTACATH PLACEMENT Left 01/23/2015   Procedure: INSERTION PORT-A-CATH ;  Surgeon: Fanny Skates, MD;  Location: [facility];  Service: General;  Laterality: Left;  . RADIOACTIVE SEED GUIDED PARTIAL MASTECTOMY WITH AXILLARY SENTINEL LYMPH NODE BIOPSY Right 01/23/2015   Procedure: RIGHT PARTIAL MASTECTOMY WITH RADIOACTIVE SEED LOCALIZATION  WITH RIGHT AXILLARY SENTINEL LYMPH NODE BIOPSY;  Surgeon: Fanny Skates, MD;  Location: [facility];  Service: General;  Laterality: Right;  . RE-EXCISION OF BREAST LUMPECTOMY Right 02/16/2015   Procedure: RE-EXCISION OF RIGHT  BREAST LUMPECTOMY MARGINS;  Surgeon: Fanny Skates, MD;  Location: [facility];  Service: General;  Laterality: Right;  . TONSILLECTOMY  age 49  . TOOTH EXTRACTION      Current Outpatient Medications  Medication Sig Dispense Refill  . citalopram (CELEXA) 10 MG tablet Take 1.5 tablets (15 mg total) by mouth daily. 126 tablet 0  . Estradiol 10 MCG TABS vaginal tablet Place one tablet vaginally qhs x 7 nights, then change to 2 x a week. 24 tablet 4  . famotidine (PEPCID) 20 MG tablet Take 1 tablet (20 mg total) by mouth at bedtime. 30 tablet 11  . gabapentin (NEURONTIN) 300 MG capsule TAKE 1  CAPSULE(300 MG) BY MOUTH AT BEDTIME 90 capsule 4  . latanoprost (XALATAN) 0.005 % ophthalmic solution 1 drop at bedtime.    . Omega-3 Fatty Acids (FISH OIL) 1000 MG CPDR Take 1 capsule by mouth daily.     Marland Kitchen omeprazole (PRILOSEC) 40 MG capsule Take 20-30 minutes before dinner 30 capsule 11  . RESTASIS MULTIDOSE 0.05 % ophthalmic emulsion INT 1 GTT INTO OU BID  3  . tamoxifen (NOLVADEX) 20 MG tablet TAKE 1 TABLET(20 MG) BY MOUTH DAILY 90 tablet 4   No current facility-administered medications for this visit.    Family History  Problem Relation Age of Onset  . Osteoporosis Mother   . Irritable bowel syndrome Mother   . Hypertension Mother    . Hypertension Father   . Hyperlipidemia Father   . Diabetes Father   . Dementia Father   . Colon cancer Maternal Grandfather 60  . Breast cancer Paternal Aunt     Review of Systems  Exam:   LMP 01/25/2011      General appearance: alert, cooperative and appears stated age, no acute distress Head: Normocephalic, without obvious abnormality Neck: no adenopathy, thyroid normal to inspection and palpation Lungs: clear to auscultation bilaterally Breasts: No axillary or supraclavicular adenopathy, Normal to palpation without dominant masses, dense in areas of previous cancer, symmetrical dense areas in left breast, no dominant mass palpable Heart: regular rate and rhythm Abdomen: soft, non-tender; no masses,  no organomegaly Extremities: extremities normal, no edema Skin: No rashes or lesions Lymph nodes: Cervical, supraclavicular, and axillary nodes normal. No abnormal inguinal nodes palpated Neurologic: Grossly normal   Pelvic: External genitalia:  no lesions              Urethra:  normal appearing urethra with no masses, tenderness or lesions              Bartholins and Skenes: normal                 Vagina: normal appearing vagina, appropriate for age, normal appearing discharge, no lesions              Cervix: neg cervical motion tenderness, no visible lesions             Bimanual Exam:   Uterus:  normal size, contour, position, consistency, mobility, non-tender              Adnexa: no mass, fullness, tenderness                 Joy, CMA Chaperone was present for exam.  A:  Well Woman with normal exam  P:   Pap : HPV/Pap Smear due 2023  Mammogram: to be scheduled ASAP  Labs: Vitamin D and A1C  Medications: Celexa 15 mg daily    D/c vagifem  Encouraged to establish care with PCP

## 2020-10-12 ENCOUNTER — Encounter: Payer: Self-pay | Admitting: Nurse Practitioner

## 2020-10-12 ENCOUNTER — Ambulatory Visit: Payer: 59 | Admitting: Obstetrics and Gynecology

## 2020-10-12 ENCOUNTER — Other Ambulatory Visit: Payer: Self-pay

## 2020-10-12 ENCOUNTER — Ambulatory Visit (INDEPENDENT_AMBULATORY_CARE_PROVIDER_SITE_OTHER): Payer: 59 | Admitting: Nurse Practitioner

## 2020-10-12 VITALS — BP 114/72 | HR 74 | Resp 16 | Ht 66.25 in | Wt 148.0 lb

## 2020-10-12 DIAGNOSIS — Z01419 Encounter for gynecological examination (general) (routine) without abnormal findings: Secondary | ICD-10-CM | POA: Diagnosis not present

## 2020-10-12 DIAGNOSIS — R7309 Other abnormal glucose: Secondary | ICD-10-CM | POA: Diagnosis not present

## 2020-10-12 DIAGNOSIS — F419 Anxiety disorder, unspecified: Secondary | ICD-10-CM

## 2020-10-12 DIAGNOSIS — E559 Vitamin D deficiency, unspecified: Secondary | ICD-10-CM

## 2020-10-12 MED ORDER — CITALOPRAM HYDROBROMIDE 10 MG PO TABS: 15.0000 mg | ORAL_TABLET | Freq: Every day | ORAL | 4 refills | Status: DC

## 2020-10-12 NOTE — Patient Instructions (Addendum)
Pap : HPV/Pap Smear due 2023  Mammogram: to be scheduled ASAP  Labs: Vitamin D and A1C  Medications: Celexa 15 mg daily    D/c vagifem   Health Maintenance for Postmenopausal Women Menopause is a normal process in which your ability to get pregnant comes to an end. This process happens slowly over many months or years, usually between the ages of 32 and 12. Menopause is complete when you have missed your menstrual periods for 12 months. It is important to talk with your health care provider about some of the most common conditions that affect women after menopause (postmenopausal women). These include heart disease, cancer, and bone loss (osteoporosis). Adopting a healthy lifestyle and getting preventive care can help to promote your health and wellness. The actions you take can also lower your chances of developing some of these common conditions. What should I know about menopause? During menopause, you may get a number of symptoms, such as:  Hot flashes. These can be moderate or severe.  Night sweats.  Decrease in sex drive.  Mood swings.  Headaches.  Tiredness.  Irritability.  Memory problems.  Insomnia. Choosing to treat or not to treat these symptoms is a decision that you make with your health care provider. Do I need hormone replacement therapy?  Hormone replacement therapy is effective in treating symptoms that are caused by menopause, such as hot flashes and night sweats.  Hormone replacement carries certain risks, especially as you become older. If you are thinking about using estrogen or estrogen with progestin, discuss the benefits and risks with your health care provider. What is my risk for heart disease and stroke? The risk of heart disease, heart attack, and stroke increases as you age. One of the causes may be a change in the body's hormones during menopause. This can affect how your body uses dietary fats, triglycerides, and cholesterol. Heart attack and  stroke are medical emergencies. There are many things that you can do to help prevent heart disease and stroke. Watch your blood pressure  High blood pressure causes heart disease and increases the risk of stroke. This is more likely to develop in people who have high blood pressure readings, are of African descent, or are overweight.  Have your blood pressure checked: ? Every 3-5 years if you are 14-7 years of age. ? Every year if you are 38 years old or older. Eat a healthy diet  Eat a diet that includes plenty of vegetables, fruits, low-fat dairy products, and lean protein.  Do not eat a lot of foods that are high in solid fats, added sugars, or sodium.   Get regular exercise Get regular exercise. This is one of the most important things you can do for your health. Most adults should:  Try to exercise for at least 150 minutes each week. The exercise should increase your heart rate and make you sweat (moderate-intensity exercise).  Try to do strengthening exercises at least twice each week. Do these in addition to the moderate-intensity exercise.  Spend less time sitting. Even light physical activity can be beneficial. Other tips  Work with your health care provider to achieve or maintain a healthy weight.  Do not use any products that contain nicotine or tobacco, such as cigarettes, e-cigarettes, and chewing tobacco. If you need help quitting, ask your health care provider.  Know your numbers. Ask your health care provider to check your cholesterol and your blood sugar (glucose). Continue to have your blood tested as directed by  your health care provider. Do I need screening for cancer? Depending on your health history and family history, you may need to have cancer screening at different stages of your life. This may include screening for:  Breast cancer.  Cervical cancer.  Lung cancer.  Colorectal cancer. What is my risk for osteoporosis? After menopause, you may be at  increased risk for osteoporosis. Osteoporosis is a condition in which bone destruction happens more quickly than new bone creation. To help prevent osteoporosis or the bone fractures that can happen because of osteoporosis, you may take the following actions:  If you are 32-71 years old, get at least 1,000 mg of calcium and at least 600 mg of vitamin D per day.  If you are older than age 39 but younger than age 72, get at least 1,200 mg of calcium and at least 600 mg of vitamin D per day.  If you are older than age 88, get at least 1,200 mg of calcium and at least 800 mg of vitamin D per day. Smoking and drinking excessive alcohol increase the risk of osteoporosis. Eat foods that are rich in calcium and vitamin D, and do weight-bearing exercises several times each week as directed by your health care provider. How does menopause affect my mental health? Depression may occur at any age, but it is more common as you become older. Common symptoms of depression include:  Low or sad mood.  Changes in sleep patterns.  Changes in appetite or eating patterns.  Feeling an overall lack of motivation or enjoyment of activities that you previously enjoyed.  Frequent crying spells. Talk with your health care provider if you think that you are experiencing depression. General instructions See your health care provider for regular wellness exams and vaccines. This may include:  Scheduling regular health, dental, and eye exams.  Getting and maintaining your vaccines. These include: ? Influenza vaccine. Get this vaccine each year before the flu season begins. ? Pneumonia vaccine. ? Shingles vaccine. ? Tetanus, diphtheria, and pertussis (Tdap) booster vaccine. Your health care provider may also recommend other immunizations. Tell your health care provider if you have ever been abused or do not feel safe at home. Summary  Menopause is a normal process in which your ability to get pregnant comes to an  end.  This condition causes hot flashes, night sweats, decreased interest in sex, mood swings, headaches, or lack of sleep.  Treatment for this condition may include hormone replacement therapy.  Take actions to keep yourself healthy, including exercising regularly, eating a healthy diet, watching your weight, and checking your blood pressure and blood sugar levels.  Get screened for cancer and depression. Make sure that you are up to date with all your vaccines. This information is not intended to replace advice given to you by your health care provider. Make sure you discuss any questions you have with your health care provider. Document Revised: 08/01/2018 Document Reviewed: 08/01/2018 Elsevier Patient Education  2021 [employer].

## 2020-10-13 ENCOUNTER — Other Ambulatory Visit: Payer: Self-pay | Admitting: Oncology

## 2020-10-13 DIAGNOSIS — Z1231 Encounter for screening mammogram for malignant neoplasm of breast: Secondary | ICD-10-CM

## 2020-10-13 LAB — HEMOGLOBIN A1C
Hgb A1c MFr Bld: 5.6 % of total Hgb (ref <5.7)
Mean Plasma Glucose: 114 mg/dL
eAG (mmol/L): 6.3 mmol/L

## 2020-10-13 LAB — VITAMIN D 25 HYDROXY (VIT D DEFICIENCY, FRACTURES): Vit D, 25-Hydroxy: 40 ng/mL (ref 30–100)

## 2020-12-07 ENCOUNTER — Other Ambulatory Visit: Payer: Self-pay

## 2020-12-07 ENCOUNTER — Ambulatory Visit
Admission: RE | Admit: 2020-12-07 | Discharge: 2020-12-07 | Disposition: A | Discharge status: Home / Self Care | Payer: 59 | Source: Ambulatory Visit | Attending: Oncology | Admitting: Oncology

## 2020-12-07 DIAGNOSIS — Z1231 Encounter for screening mammogram for malignant neoplasm of breast: Secondary | ICD-10-CM

## 2020-12-30 ENCOUNTER — Telehealth: Payer: Self-pay

## 2020-12-30 NOTE — Telephone Encounter (Signed)
Patient going leaving the country next week and needs to refill her Celexa a little early.  I explained that Rx is at the pharmacy for the year and what prevents pharmacy from going ahead and filling it early is the insurance company. She will need to call her ins co and explain her situation and they will create an override that will allow pharmacy to fill her Rx early.

## 2021-04-05 ENCOUNTER — Inpatient Hospital Stay: Payer: 59 | Attending: Oncology | Admitting: Oncology

## 2021-04-05 ENCOUNTER — Inpatient Hospital Stay: Payer: 59

## 2021-05-04 ENCOUNTER — Other Ambulatory Visit: Payer: Self-pay | Admitting: Oncology

## 2021-05-05 ENCOUNTER — Telehealth: Payer: Self-pay | Admitting: Oncology

## 2021-05-05 NOTE — Telephone Encounter (Signed)
Scheduled per sch msg. Called an left msg. Mailed printout

## 2021-07-05 ENCOUNTER — Inpatient Hospital Stay: Payer: 59 | Admitting: Adult Health

## 2021-08-22 ENCOUNTER — Other Ambulatory Visit: Payer: Self-pay | Admitting: Oncology

## 2021-10-20 ENCOUNTER — Other Ambulatory Visit: Payer: Self-pay

## 2021-10-20 DIAGNOSIS — F419 Anxiety disorder, unspecified: Secondary | ICD-10-CM

## 2021-10-20 MED ORDER — CITALOPRAM HYDROBROMIDE 10 MG PO TABS: 15.0000 mg | ORAL_TABLET | Freq: Every day | ORAL | 0 refills | Status: DC

## 2021-10-20 NOTE — Telephone Encounter (Signed)
Last AEX 10/12/20. ?Scheduled 10/22/2021. ? ?Has one pill left for tomorrow. ?

## 2021-10-20 NOTE — Progress Notes (Signed)
61 y.o. G0P0000 Divorced White or Caucasian Not Hispanic or Latino female here for annual exam.  No vaginal bleeding. Not sexually active.  ?  ?She has a h/o estrogen/progesterone receptor positive breast cancer, diagnosed in 2016. S/p lumpectomy, SLN biopsy. S/P chemo, radiation, and tamoxifen. ? ?H/O severe vaginal atrophy, was on estrogen, tried vaginal dilators, too uncomfortable.  ? ?She has had constipation for a year. Having a BM daily, marble/dry. She has changed her diet and is having soft stools.  ? ?No urinary c/o.  ? ?She is on Celexa for anxiety, doing very well. ? ?She is on Gabapentin for nerve pain, needs a refill. Dr Jana Hakim used to give it to her, but he retired.  ? ?Patient's last menstrual period was 01/25/2011.          ?Sexually active: No.  ?The current method of family planning is none.    ?Exercising: Yes.    Gym/ health club routine includes  . ?Smoker:  no ? ?Health Maintenance: ?Pap:  05-01-17 normal ?History of abnormal Pap:  yes-many years ago, no surgery ?MMG:  12-07-20 normal ?BMD:   12-16-19 Osteopenia T score -1.4, FRAX 17.9/1.3% ?Colonoscopy: 10-14-13 normal ?TDaP:  2013 ?Gardasil: N/A ? ? reports that she quit smoking about 16 years ago. Her smoking use included cigarettes. She has never used smokeless tobacco. She reports current alcohol use. She reports that she does not use drugs. Hairstylist, has 2 dogs, labradoodle and a maltipoo. Teaching palates, working out 5 days a week. Rare ETOH.  ? ?Past Medical History:  ?Diagnosis Date  ? ACID REFLUX DISEASE 07/12/2010  ? Anxiety   ? Arachnoid cyst   ? left frontal lobe  ? Arthritis   ? hands  ? Breast cancer ([place]) 12/2014  ? right  ? Complication of anesthesia 2012  ? states woke up during colonoscopy with Propofol  ? Dental crowns present   ? Difficulty swallowing pills   ? Glaucoma   ? Hypothyroidism   ? Migraines   ? Nervous stomach   ? Personal history of chemotherapy 2016  ? Personal history of radiation therapy 2016  ? Rt breast   ? Seasonal allergies   ? Stress fracture of foot 12/2014  ? right  ? ? ?Past Surgical History:  ?Procedure Laterality Date  ? BREAST LUMPECTOMY Right   ? 2016  ? COLONOSCOPY WITH PROPOFOL  10/14/2013  ? ESOPHAGOGASTRODUODENOSCOPY ENDOSCOPY    ? LASIK    ? PORTACATH PLACEMENT Left 01/23/2015  ? Procedure: INSERTION PORT-A-CATH ;  Surgeon: Fanny Skates, MD;  Location: [facility];  Service: General;  Laterality: Left;  ? RADIOACTIVE SEED GUIDED PARTIAL MASTECTOMY WITH AXILLARY SENTINEL LYMPH NODE BIOPSY Right 01/23/2015  ? Procedure: RIGHT PARTIAL MASTECTOMY WITH RADIOACTIVE SEED LOCALIZATION  WITH RIGHT AXILLARY SENTINEL LYMPH NODE BIOPSY;  Surgeon: Fanny Skates, MD;  Location: [facility];  Service: General;  Laterality: Right;  ? RE-EXCISION OF BREAST LUMPECTOMY Right 02/16/2015  ? Procedure: RE-EXCISION OF RIGHT  BREAST LUMPECTOMY MARGINS;  Surgeon: Fanny Skates, MD;  Location: [facility];  Service: General;  Laterality: Right;  ? TONSILLECTOMY  age 70  ? TOOTH EXTRACTION    ? ? ?Current Outpatient Medications  ?Medication Sig Dispense Refill  ? citalopram (CELEXA) 10 MG tablet Take 1.5 tablets (15 mg total) by mouth daily. 135 tablet 0  ? famotidine (PEPCID) 20 MG tablet Take 1 tablet (20 mg total) by mouth at bedtime. 30 tablet 11  ? gabapentin (  NEURONTIN) 300 MG capsule TAKE 1 CAPSULE(300 MG) BY MOUTH AT BEDTIME 90 capsule 0  ? latanoprost (XALATAN) 0.005 % ophthalmic solution 1 drop at bedtime.    ? Omega-3 Fatty Acids (FISH OIL) 1000 MG CPDR Take 1 capsule by mouth daily.     ? RESTASIS MULTIDOSE 0.05 % ophthalmic emulsion INT 1 GTT INTO OU BID  3  ? ?No current facility-administered medications for this visit.  ? ? ?Family History  ?Problem Relation Age of Onset  ? Osteoporosis Mother   ? Irritable bowel syndrome Mother   ? Hypertension Mother   ? Hypertension Father   ? Hyperlipidemia Father   ? Diabetes Father   ? Dementia Father   ? Colon cancer Maternal  Grandfather 24  ? Breast cancer Paternal Aunt   ? ? ?Review of Systems  ?All other systems reviewed and are negative. ? ?Exam:   ?BP 122/76 (Cuff Size: Normal)   Pulse 84   Ht 5\' 6"  (1.676 m)   Wt 153 lb (69.4 kg)   LMP 01/25/2011   SpO2 98%   BMI 24.69 kg/m?   Weight change: @WEIGHTCHANGE @ Height:   Height: 5\' 6"  (167.6 cm)  ?Ht Readings from Last 3 Encounters:  ?10/22/21 5\' 6"  (1.676 m)  ?10/12/20 5' 6.25" (1.683 m)  ?03/31/20 5\' 6"  (1.676 m)  ? ? ?General appearance: alert, cooperative and appears stated age ?Head: Normocephalic, without obvious abnormality, atraumatic ?Neck: no adenopathy, supple, symmetrical, trachea midline and thyroid normal to inspection and palpation ?Lungs: clear to auscultation bilaterally ?Cardiovascular: regular rate and rhythm ?Breasts: normal appearance, no masses or tenderness ?Abdomen: soft, non-tender; non distended,  no masses,  no organomegaly ?Extremities: extremities normal, atraumatic, no cyanosis or edema ?Skin: Skin color, texture, turgor normal. No rashes or lesions ?Lymph nodes: Cervical, supraclavicular, and axillary nodes normal. ?No abnormal inguinal nodes palpated ?Neurologic: Grossly normal ? ? ?Pelvic: External genitalia:  no lesions ?             Urethra:  normal appearing urethra with no masses, tenderness or lesions ?             Bartholins and Skenes: normal    ?             Vagina: very atrophic appearing vagina with normal color and discharge, no lesions ?             Cervix: no lesions ?              ?Bimanual Exam:  Uterus:  normal size, contour, position, consistency, mobility, non-tender ?             Adnexa: no mass, fullness, tenderness ?              Rectovaginal: Confirms ?              Anus:  normal sphincter tone, no lesions ? ?Caryn Bee, CMA chaperoned for the exam. ? ?1. Well woman exam ?Discussed breast self exam ?Discussed calcium and vit D intake ?Mammogram in 4/23 ? ?2. History of breast cancer ?See above ? ?3. Osteopenia, unspecified  location ?- DG Bone Density; Future ? ?4. Prediabetes ?- Hemoglobin A1c ? ?5. Vitamin D deficiency ?- VITAMIN D 25 Hydroxy (Vit-D Deficiency, Fractures) ? ?6. Anxiety ?Doing well on Celexa ?- citalopram (CELEXA) 10 MG tablet; Take 1.5 tablets (15 mg total) by mouth daily.  Dispense: 135 tablet; Refill: 3 ? ?7. Chemotherapy-induced neuropathy (HCC) ?- gabapentin (NEURONTIN) 300 MG capsule; TAKE 1  CAPSULE(300 MG) BY MOUTH AT BEDTIME  Dispense: 90 capsule; Refill: 3 ?- CBC ?- Comprehensive metabolic panel ? ?8. Screening for cervical cancer ?- Cytology - PAP ? ?9. Immunization due ?- Tdap vaccine greater than or equal to 7yo IM ? ? ?

## 2021-10-22 ENCOUNTER — Telehealth: Payer: Self-pay | Admitting: *Deleted

## 2021-10-22 ENCOUNTER — Other Ambulatory Visit (HOSPITAL_COMMUNITY)
Admission: RE | Admit: 2021-10-22 | Discharge: 2021-10-22 | Disposition: A | Discharge status: Home / Self Care | Payer: 59 | Source: Ambulatory Visit | Attending: Obstetrics and Gynecology | Admitting: Obstetrics and Gynecology

## 2021-10-22 ENCOUNTER — Ambulatory Visit (INDEPENDENT_AMBULATORY_CARE_PROVIDER_SITE_OTHER): Payer: 59 | Admitting: Obstetrics and Gynecology

## 2021-10-22 ENCOUNTER — Other Ambulatory Visit: Payer: Self-pay

## 2021-10-22 ENCOUNTER — Encounter: Payer: Self-pay | Admitting: Obstetrics and Gynecology

## 2021-10-22 VITALS — BP 122/76 | HR 84 | Ht 66.0 in | Wt 153.0 lb

## 2021-10-22 DIAGNOSIS — Z87898 Personal history of other specified conditions: Secondary | ICD-10-CM | POA: Insufficient documentation

## 2021-10-22 DIAGNOSIS — Z124 Encounter for screening for malignant neoplasm of cervix: Secondary | ICD-10-CM | POA: Insufficient documentation

## 2021-10-22 DIAGNOSIS — M8580 Other specified disorders of bone density and structure, unspecified site: Secondary | ICD-10-CM

## 2021-10-22 DIAGNOSIS — Z23 Encounter for immunization: Secondary | ICD-10-CM | POA: Diagnosis not present

## 2021-10-22 DIAGNOSIS — L570 Actinic keratosis: Secondary | ICD-10-CM | POA: Insufficient documentation

## 2021-10-22 DIAGNOSIS — R7303 Prediabetes: Secondary | ICD-10-CM

## 2021-10-22 DIAGNOSIS — E559 Vitamin D deficiency, unspecified: Secondary | ICD-10-CM

## 2021-10-22 DIAGNOSIS — Z853 Personal history of malignant neoplasm of breast: Secondary | ICD-10-CM | POA: Diagnosis not present

## 2021-10-22 DIAGNOSIS — Z01419 Encounter for gynecological examination (general) (routine) without abnormal findings: Secondary | ICD-10-CM

## 2021-10-22 DIAGNOSIS — G620 Drug-induced polyneuropathy: Secondary | ICD-10-CM

## 2021-10-22 DIAGNOSIS — T451X5A Adverse effect of antineoplastic and immunosuppressive drugs, initial encounter: Secondary | ICD-10-CM

## 2021-10-22 DIAGNOSIS — F419 Anxiety disorder, unspecified: Secondary | ICD-10-CM

## 2021-10-22 MED ORDER — GABAPENTIN 300 MG PO CAPS: ORAL_CAPSULE | ORAL | 3 refills | Status: DC

## 2021-10-22 MED ORDER — CITALOPRAM HYDROBROMIDE 10 MG PO TABS: 15.0000 mg | ORAL_TABLET | Freq: Every day | ORAL | 3 refills | Status: DC

## 2021-10-22 NOTE — Telephone Encounter (Signed)
Dr. Talbert Nan sent me a secure chat asking "Can you please call her pharmacy, they wouldn't give her enough Celexa for 3 months. They said it was the wrong # of pills. She is taking 1.5 of the 10 Celexa tablets a day. 90 + 45= 135, that is the # I ordered. Thanks!!!!" ? ? ?I called with [facility] and was told patient picked up 8 tablets on 10/20/21. The pharmacy did receive the Rx from today and ran the Rx with insurance again and said the patient insurance won't allow her to have 90 day supply on 30 day supply.  ?

## 2021-10-22 NOTE — Telephone Encounter (Signed)
Patient aware of this as well.  ?

## 2021-10-22 NOTE — Patient Instructions (Signed)

## 2021-10-23 LAB — COMPREHENSIVE METABOLIC PANEL
AG Ratio: 1.6 (calc) (ref 1.0–2.5)
ALT: 13 U/L (ref 6–29)
AST: 20 U/L (ref 10–35)
Albumin: 4.1 g/dL (ref 3.6–5.1)
Alkaline phosphatase (APISO): 74 U/L (ref 37–153)
BUN: 19 mg/dL (ref 7–25)
CO2: 25 mmol/L (ref 20–32)
Calcium: 9.2 mg/dL (ref 8.6–10.4)
Chloride: 103 mmol/L (ref 98–110)
Creat: 0.88 mg/dL (ref 0.50–1.05)
Globulin: 2.6 g/dL (calc) (ref 1.9–3.7)
Glucose, Bld: 90 mg/dL (ref 65–99)
Potassium: 3.9 mmol/L (ref 3.5–5.3)
Sodium: 141 mmol/L (ref 135–146)
Total Bilirubin: 0.4 mg/dL (ref 0.2–1.2)
Total Protein: 6.7 g/dL (ref 6.1–8.1)

## 2021-10-23 LAB — HEMOGLOBIN A1C
Hgb A1c MFr Bld: 5.8 % of total Hgb — ABNORMAL HIGH (ref <5.7)
Mean Plasma Glucose: 120 mg/dL
eAG (mmol/L): 6.6 mmol/L

## 2021-10-23 LAB — CBC
HCT: 40.3 % (ref 35.0–45.0)
Hemoglobin: 13.5 g/dL (ref 11.7–15.5)
MCH: 32.1 pg (ref 27.0–33.0)
MCHC: 33.5 g/dL (ref 32.0–36.0)
MCV: 96 fL (ref 80.0–100.0)
MPV: 9.5 fL (ref 7.5–12.5)
Platelets: 278 10*3/uL (ref 140–400)
RBC: 4.2 10*6/uL (ref 3.80–5.10)
RDW: 13.5 % (ref 11.0–15.0)
WBC: 6.1 10*3/uL (ref 3.8–10.8)

## 2021-10-23 LAB — VITAMIN D 25 HYDROXY (VIT D DEFICIENCY, FRACTURES): Vit D, 25-Hydroxy: 46 ng/mL (ref 30–100)

## 2021-10-23 NOTE — Telephone Encounter (Signed)
I'm confused, do we need to send in a 30 day supply with refills? Is she going to be able to pick up her medication? ?Thanks ?

## 2021-10-25 NOTE — Telephone Encounter (Signed)
The pharmacy did say we need to do anything. I was told the patient will be able to get her Rx. She picked up 8 tablet on 10/20/21 once she finished the 8 tablets she will be able to refill per [facility]. I told patient is she has any issues to let us know.  ?

## 2021-10-26 ENCOUNTER — Telehealth: Payer: Self-pay | Admitting: *Deleted

## 2021-10-26 LAB — CYTOLOGY - PAP
Comment: NEGATIVE
Diagnosis: NEGATIVE
High risk HPV: NEGATIVE

## 2021-10-26 NOTE — Telephone Encounter (Signed)
PA done via cover my meds for Celexa 10 mg tablet medication approved until 10/27/2022. Pharmacy informed as well. ?

## 2021-10-29 ENCOUNTER — Other Ambulatory Visit: Payer: Self-pay | Admitting: Obstetrics and Gynecology

## 2021-10-29 DIAGNOSIS — Z1231 Encounter for screening mammogram for malignant neoplasm of breast: Secondary | ICD-10-CM

## 2021-12-20 ENCOUNTER — Ambulatory Visit: Payer: 59

## 2022-01-05 ENCOUNTER — Encounter: Payer: Self-pay | Admitting: Obstetrics and Gynecology

## 2022-01-20 ENCOUNTER — Ambulatory Visit: Payer: 59

## 2022-02-01 ENCOUNTER — Encounter: Payer: Self-pay | Admitting: Orthopedic Surgery

## 2022-02-01 ENCOUNTER — Ambulatory Visit (INDEPENDENT_AMBULATORY_CARE_PROVIDER_SITE_OTHER): Payer: 59

## 2022-02-01 ENCOUNTER — Ambulatory Visit: Payer: 59 | Admitting: Orthopedic Surgery

## 2022-02-01 VITALS — BP 113/79 | HR 80 | Ht 66.0 in | Wt 153.0 lb

## 2022-02-01 DIAGNOSIS — M25531 Pain in right wrist: Secondary | ICD-10-CM | POA: Diagnosis not present

## 2022-02-01 MED ORDER — MELOXICAM 7.5 MG PO TABS: 7.5000 mg | ORAL_TABLET | Freq: Every day | ORAL | 0 refills | Status: AC

## 2022-02-01 NOTE — Progress Notes (Signed)
Office Visit Note   Patient: Bonnie Ward           Date of Birth: 25-Oct-1960           MRN: [id number] Visit Date: 02/01/2022              Requested by: No referring provider defined for this encounter. PCP: Patient, No Pcp Per (Inactive)   Assessment & Plan: Visit Diagnoses:  1. Pain in right wrist     Plan: Discussed with patient that her x-rays are unremarkable.  This is likely a wrist brain.  I examined her scaphoid under live fluoroscopy and there was no evidence of scaphoid fracture.  She has no DRUJ instability.  We discussed treating this with a period of immobilization and an oral anti-inflammatory medication.  I can see her back in few weeks if she remains symptomatic.  Follow-Up Instructions: No follow-ups on file.   Orders:  Orders Placed This Encounter  Procedures   XR Wrist Complete Right   No orders of the defined types were placed in this encounter.     Procedures: No procedures performed   Clinical Data: No additional findings.   Subjective: Chief Complaint  Patient presents with   Right Wrist - Injury    RIGHT Handed, DOI: 01/25/22, states that she fell    This is a 61 year old right-hand-dominant female presents with right wrist pain.  She was walking her dog on 01/25/2022 when she had ground-level fall and landed on her her knees and her right wrist.  She describes diffuse pain in the wrist.  Her pain is both ulnar, radial, and dorsal.  Pain is worse with terminal extension and with ulnar deviation.  The denies that she denies any subjective instability.  She denies any numbness or paresthesias.  She has been wearing a wrist brace with some symptom relief.  She does note that the swelling has improved significantly.  Injury    Review of Systems   Objective: Vital Signs: BP 113/79 (BP Location: Left Arm, Patient Position: Sitting)   Pulse 80   Ht '5\' 6"'$  (1.676 m)   Wt 153 lb (69.4 kg)   LMP 01/25/2011   BMI 24.69 kg/m   Physical  Exam Constitutional:      Appearance: Normal appearance.  Cardiovascular:     Rate and Rhythm: Normal rate.     Pulses: Normal pulses.  Pulmonary:     Effort: Pulmonary effort is normal.  Skin:    General: Skin is warm and dry.     Capillary Refill: Capillary refill takes less than 2 seconds.  Neurological:     Mental Status: She is alert.     Right Hand Exam   Tenderness  The patient is experiencing tenderness in the dorsal area, ulnar area and radial area.  Range of Motion  The patient has normal right wrist ROM.   Other  Erythema: absent Sensation: normal Pulse: present  Comments:  Full range of motion of the wrist but with some discomfort with terminal extension.  No DRUJ instability.  Tender to palpation near circumferentially.  There is some mild ecchymosis at the dorsal wrist over the ulnar head.  No evidence of ECU instability.        Specialty Comments:  No specialty comments available.  Imaging: No results found.   PMFS History: Patient Active Problem List   Diagnosis Date Noted   Actinic keratosis 10/22/2021   History of neoplasm 10/22/2021   Chest pain 03/07/2016  CHF NYHA class I ([facility]) 09/10/2015   Chemotherapy adverse reaction 09/01/2015   Hot flashes 06/04/2015   Chemotherapy-induced neuropathy ([place]) 05/28/2015   Fatigue 05/08/2015   Rash 04/23/2015   Antineoplastic chemotherapy induced anemia 04/23/2015   Insomnia 04/23/2015   Postmenopausal hormone replacement therapy 04/13/2015   Drug rash 04/10/2015   Mucositis due to chemotherapy 03/27/2015   Constipation 03/13/2015   Heartburn 03/13/2015   Malignant neoplasm of upper-outer quadrant of right breast in female, estrogen receptor positive ([place]) 12/31/2014   Hormone replacement therapy (postmenopausal) 10/18/2010   Arachnoid cyst    ACID REFLUX DISEASE 07/12/2010   DYSPHAGIA UNSPECIFIED 07/12/2010   WRIST PAIN, RIGHT 04/22/2010   STRESS FRACTURE OF THE METATARSALS 04/22/2010   BACK  PAIN 10/05/2009   UTI'S, RECURRENT 03/10/2009   GLAUCOMA, LEFT EYE 01/13/2009   ALLERGIC RHINITIS 01/13/2009   HEADACHE 01/13/2009   Past Medical History:  Diagnosis Date   ACID REFLUX DISEASE 07/12/2010   Anxiety    Arachnoid cyst    left frontal lobe   Arthritis    hands   Breast cancer ([place]) 12/8097   right   Complication of anesthesia 2012   states woke up during colonoscopy with Propofol   Dental crowns present    Difficulty swallowing pills    Glaucoma    Hypothyroidism    Migraines    Nervous stomach    Personal history of chemotherapy 2016   Personal history of radiation therapy 2016   Rt breast   Seasonal allergies    Stress fracture of foot 12/2014   right    Family History  Problem Relation Age of Onset   Osteoporosis Mother    Irritable bowel syndrome Mother    Hypertension Mother    Hypertension Father    Hyperlipidemia Father    Diabetes Father    Dementia Father    Colon cancer Maternal Grandfather 72   Breast cancer Paternal Aunt     Past Surgical History:  Procedure Laterality Date   BREAST LUMPECTOMY Right    2016   COLONOSCOPY WITH PROPOFOL  10/14/2013   ESOPHAGOGASTRODUODENOSCOPY ENDOSCOPY     LASIK     PORTACATH PLACEMENT Left 01/23/2015   Procedure: INSERTION PORT-A-CATH ;  Surgeon: Fanny Skates, MD;  Location: [place];  Service: General;  Laterality: Left;   RADIOACTIVE SEED GUIDED PARTIAL MASTECTOMY WITH AXILLARY SENTINEL LYMPH NODE BIOPSY Right 01/23/2015   Procedure: RIGHT PARTIAL MASTECTOMY WITH RADIOACTIVE SEED LOCALIZATION  WITH RIGHT AXILLARY SENTINEL LYMPH NODE BIOPSY;  Surgeon: Fanny Skates, MD;  Location: [facility];  Service: General;  Laterality: Right;   RE-EXCISION OF BREAST LUMPECTOMY Right 02/16/2015   Procedure: RE-EXCISION OF RIGHT  BREAST LUMPECTOMY MARGINS;  Surgeon: Fanny Skates, MD;  Location: [place];  Service: General;  Laterality: Right;   TONSILLECTOMY  age 62    TOOTH EXTRACTION     Social History   Occupational History   Not on file  Tobacco Use   Smoking status: Former    Packs/day: 0.00    Years: 0.00    Total pack years: 0.00    Types: Cigarettes    Quit date: 02/19/2005    Years since quitting: 16.9   Smokeless tobacco: Never  Vaping Use   Vaping Use: Never used  Substance and Sexual Activity   Alcohol use: Yes    Comment: occasionally   Drug use: No   Sexual activity: Not Currently    Partners: Male  Birth control/protection: Post-menopausal

## 2022-04-11 ENCOUNTER — Other Ambulatory Visit: Payer: Self-pay | Admitting: Obstetrics and Gynecology

## 2022-04-11 ENCOUNTER — Other Ambulatory Visit: Payer: Self-pay

## 2022-04-11 DIAGNOSIS — Z1231 Encounter for screening mammogram for malignant neoplasm of breast: Secondary | ICD-10-CM

## 2022-04-18 ENCOUNTER — Ambulatory Visit
Admission: RE | Admit: 2022-04-18 | Discharge: 2022-04-18 | Disposition: A | Discharge status: Home / Self Care | Payer: 59 | Source: Ambulatory Visit | Attending: Obstetrics and Gynecology | Admitting: Obstetrics and Gynecology

## 2022-04-18 DIAGNOSIS — M8580 Other specified disorders of bone density and structure, unspecified site: Secondary | ICD-10-CM

## 2022-05-12 ENCOUNTER — Ambulatory Visit (INDEPENDENT_AMBULATORY_CARE_PROVIDER_SITE_OTHER): Payer: 59 | Admitting: Obstetrics and Gynecology

## 2022-05-12 VITALS — BP 108/62 | Wt 155.0 lb

## 2022-05-12 DIAGNOSIS — M1990 Unspecified osteoarthritis, unspecified site: Secondary | ICD-10-CM

## 2022-05-12 DIAGNOSIS — R5383 Other fatigue: Secondary | ICD-10-CM

## 2022-05-12 DIAGNOSIS — K5909 Other constipation: Secondary | ICD-10-CM | POA: Diagnosis not present

## 2022-05-12 DIAGNOSIS — M810 Age-related osteoporosis without current pathological fracture: Secondary | ICD-10-CM | POA: Diagnosis not present

## 2022-05-12 NOTE — Progress Notes (Signed)
GYNECOLOGY  VISIT   HPI: 60 y.o.   Divorced White or Caucasian Not Hispanic or Latino  female   [place] with Patient's last menstrual period was 01/25/2011.   here for   bone density consult.   DEXA from 04/18/22 returned with a T score of -2.5 at the left femur neck.   Labs from 10/22/21:  Vit d 46 Creat 0.88 Calcium 9.2 Normal CBC  Her stomach has been bothering her, she has extreme constipation, sometimes diarrhea, bloating, fatigue, hair loss. Forgetting things.  Typically has a BM q 1-2 days  She takes pepcid every night. She was on omeprazole, GI told her to change to 1 pepcid a day. She is sensitive to certain foods and alcohol.   She has arthritis in her hands, some nodules in her fingers and on her wrist.   GYNECOLOGIC HISTORY: Patient's last menstrual period was 01/25/2011. Contraception:pmp Menopausal hormone therapy: none         OB History     Gravida  0   Para  0   Term  0   Preterm  0   AB  0   Living  0      SAB  0   IAB  0   Ectopic  0   Multiple  0   Live Births  0              Patient Active Problem List   Diagnosis Date Noted   Actinic keratosis 10/22/2021   History of neoplasm 10/22/2021   Chest pain 03/07/2016   CHF NYHA class I ([place]) 09/10/2015   Chemotherapy adverse reaction 09/01/2015   Hot flashes 06/04/2015   Chemotherapy-induced neuropathy ([place]) 05/28/2015   Fatigue 05/08/2015   Rash 04/23/2015   Antineoplastic chemotherapy induced anemia 04/23/2015   Insomnia 04/23/2015   Postmenopausal hormone replacement therapy 04/13/2015   Drug rash 04/10/2015   Mucositis due to chemotherapy 03/27/2015   Constipation 03/13/2015   Heartburn 03/13/2015   Malignant neoplasm of upper-outer quadrant of right breast in female, estrogen receptor positive ([place]) 12/31/2014   Hormone replacement therapy (postmenopausal) 10/18/2010   Arachnoid cyst    ACID REFLUX DISEASE 07/12/2010   DYSPHAGIA UNSPECIFIED 07/12/2010   WRIST PAIN,  RIGHT 04/22/2010   STRESS FRACTURE OF THE METATARSALS 04/22/2010   BACK PAIN 10/05/2009   UTI'S, RECURRENT 03/10/2009   GLAUCOMA, LEFT EYE 01/13/2009   ALLERGIC RHINITIS 01/13/2009   HEADACHE 01/13/2009    Past Medical History:  Diagnosis Date   ACID REFLUX DISEASE 07/12/2010   Anxiety    Arachnoid cyst    left frontal lobe   Arthritis    hands   Breast cancer ([place]) 09/4399   right   Complication of anesthesia 2012   states woke up during colonoscopy with Propofol   Dental crowns present    Difficulty swallowing pills    Glaucoma    Hypothyroidism    Migraines    Nervous stomach    Personal history of chemotherapy 2016   Personal history of radiation therapy 2016   Rt breast   Seasonal allergies    Stress fracture of foot 12/2014   right    Past Surgical History:  Procedure Laterality Date   BREAST LUMPECTOMY Right    2016   COLONOSCOPY WITH PROPOFOL  10/14/2013   ESOPHAGOGASTRODUODENOSCOPY ENDOSCOPY     LASIK     PORTACATH PLACEMENT Left 01/23/2015   Procedure: INSERTION PORT-A-CATH ;  Surgeon: Fanny Skates, MD;  Location: [facility];  Service: General;  Laterality: Left;   RADIOACTIVE SEED GUIDED PARTIAL MASTECTOMY WITH AXILLARY SENTINEL LYMPH NODE BIOPSY Right 01/23/2015   Procedure: RIGHT PARTIAL MASTECTOMY WITH RADIOACTIVE SEED LOCALIZATION  WITH RIGHT AXILLARY SENTINEL LYMPH NODE BIOPSY;  Surgeon: Fanny Skates, MD;  Location: [facility];  Service: General;  Laterality: Right;   RE-EXCISION OF BREAST LUMPECTOMY Right 02/16/2015   Procedure: RE-EXCISION OF RIGHT  BREAST LUMPECTOMY MARGINS;  Surgeon: Fanny Skates, MD;  Location: [facility];  Service: General;  Laterality: Right;   TONSILLECTOMY  age 76   TOOTH EXTRACTION      Current Outpatient Medications  Medication Sig Dispense Refill   CALCIUM PO Take 1,200 mg by mouth.     citalopram (CELEXA) 10 MG tablet Take 1.5 tablets (15 mg total) by mouth daily. 135 tablet  3   famotidine (PEPCID) 20 MG tablet Take 1 tablet (20 mg total) by mouth at bedtime. 30 tablet 11   gabapentin (NEURONTIN) 300 MG capsule TAKE 1 CAPSULE(300 MG) BY MOUTH AT BEDTIME 90 capsule 3   latanoprost (XALATAN) 0.005 % ophthalmic solution 1 drop at bedtime.     Omega-3 Fatty Acids (FISH OIL) 1000 MG CPDR Take 1 capsule by mouth daily.      No current facility-administered medications for this visit.     ALLERGIES: Lortab [hydrocodone-acetaminophen], Milk-related compounds, and Coreg [carvedilol]  Family History  Problem Relation Age of Onset   Osteoporosis Mother    Irritable bowel syndrome Mother    Hypertension Mother    Hypertension Father    Hyperlipidemia Father    Diabetes Father    Dementia Father    Colon cancer Maternal Grandfather 22   Breast cancer Paternal Aunt     Social History   Socioeconomic History   Marital status: Divorced    Spouse name: Not on file   Number of children: Not on file   Years of education: Not on file   Highest education level: Not on file  Occupational History   Not on file  Tobacco Use   Smoking status: Former    Packs/day: 0.00    Years: 0.00    Total pack years: 0.00    Types: Cigarettes    Quit date: 02/19/2005    Years since quitting: 17.2   Smokeless tobacco: Never  Vaping Use   Vaping Use: Never used  Substance and Sexual Activity   Alcohol use: Yes    Comment: occasionally   Drug use: No   Sexual activity: Not Currently    Partners: Male    Birth control/protection: Post-menopausal  Other Topics Concern   Not on file  Social History Narrative   Hairstylist    Hh of 2    Former smoker   Husband smokes outside   Social Determinants of Radio broadcast assistant Strain: Not on file  Food Insecurity: Not on file  Transportation Needs: Not on file  Physical Activity: Not on file  Stress: Not on file  Social Connections: Not on file  Intimate Partner Violence: Not on file    Review of Systems  All  other systems reviewed and are negative.   PHYSICAL EXAMINATION:    BP 108/62   Wt 155 lb (70.3 kg)   LMP 01/25/2011   BMI 25.02 kg/m     General appearance: alert, cooperative and appears stated age She does have enlargement of several of the joints in her hands and a nodule on her wrist.  1. Age-related osteoporosis without current pathological fracture Recent normal vit D Discussed calcium, vit d and exercise Reviewed decreasing her risk of falls - Magnesium - Phosphorus - Comprehensive metabolic panel She has GERD, discussed reclast, reviewed side effects, including but not limited to: an acute phase reaction, muscle aching, atypical fractures and osteonecrosis of the jaw.  -Once labs have returned and if they are normal, will set her up for Reclast  2. Other fatigue - CBC - TSH  3. Other constipation Add magnesium 500 mg a day - CBC - TSH  4. Arthritis Will place referral to Rheumatology

## 2022-05-12 NOTE — Patient Instructions (Addendum)
Magnesium 500 mg a day for constipationOsteoporosis  Osteoporosis happens when the bones become thin and less dense than normal. Osteoporosis makes bones more brittle and fragile and more likely to break (fracture). Over time, osteoporosis can cause your bones to become so weak that they fracture after a minor fall. Bones in the hip, wrist, and spine are most likely to fracture due to osteoporosis. What are the causes? The exact cause of this condition is not known. What increases the risk? You are more likely to develop this condition if you: Have family members with this condition. Have poor nutrition. Use the following: Steroid medicines, such as prednisone. Anti-seizure medicines. Nicotine or tobacco, such as cigarettes, e-cigarettes, and chewing tobacco. Are female. Are age 61 or older. Are not physically active (are sedentary). Are of European or Asian descent. Have a small body frame. What are the signs or symptoms? A fracture might be the first sign of osteoporosis, especially if the fracture results from a fall or injury that usually would not cause a bone to break. Other signs and symptoms include: Pain in the neck or low back. Stooped posture. Loss of height. How is this diagnosed? This condition may be diagnosed based on: Your medical history. A physical exam. A bone mineral density test, also called a DXA or DEXA test (dual-energy X-ray absorptiometry test). This test uses X-rays to measure the amount of minerals in your bones. How is this treated? This condition may be treated by: Making lifestyle changes, such as: Including foods with more calcium and vitamin D in your diet. Doing weight-bearing and muscle-strengthening exercises. Stopping tobacco use. Limiting alcohol intake. Taking medicine to slow the process of bone loss or to increase bone density. Taking daily supplements of calcium and vitamin D. Taking hormone replacement medicines, such as estrogen for  women and testosterone for men. Monitoring your levels of calcium and vitamin D. The goal of treatment is to strengthen your bones and lower your risk for a fracture. Follow these instructions at home: Eating and drinking Include calcium and vitamin D in your diet. Calcium is important for bone health, and vitamin D helps your body absorb calcium. Good sources of calcium and vitamin D include: Certain fatty fish, such as salmon and tuna. Products that have calcium and vitamin D added to them (are fortified), such as fortified cereals. Egg yolks. Cheese. Liver.  Activity Do exercises as told by your health care provider. Ask your health care provider what exercises and activities are safe for you. You should do: Exercises that make you work against gravity (weight-bearing exercises), such as tai chi, yoga, or walking. Exercises to strengthen muscles, such as lifting weights. Lifestyle Do not drink alcohol if: Your health care provider tells you not to drink. You are pregnant, may be pregnant, or are planning to become pregnant. If you drink alcohol: Limit how much you use to: 0-1 drink a day for women. 0-2 drinks a day for men. Know how much alcohol is in your drink. In the [place] one drink equals one 12 oz bottle of beer (355 mL), one 5 oz glass of wine (148 mL), or one 1 oz glass of hard liquor (44 mL). Do not use any products that contain nicotine or tobacco, such as cigarettes, e-cigarettes, and chewing tobacco. If you need help quitting, ask your health care provider. Preventing falls Use devices to help you move around (mobility aids) as needed, such as canes, walkers, scooters, or crutches. Keep rooms well-lit and clutter-free. Remove tripping  hazards from walkways, including cords and throw rugs. Install grab bars in bathrooms and safety rails on stairs. Use rubber mats in the bathroom and other areas that are often wet or slippery. Wear closed-toe shoes that fit well and  support your feet. Wear shoes that have rubber soles or low heels. Review your medicines with your health care provider. Some medicines can cause dizziness or changes in blood pressure, which can increase your risk of falling. General instructions Take over-the-counter and prescription medicines only as told by your health care provider. Keep all follow-up visits. This is important. Contact a health care provider if: You have never been screened for osteoporosis and you are: A woman who is age 61 or older. A man who is age 61 or older. Get help right away if: You fall or injure yourself. Summary Osteoporosis is thinning and loss of density in your bones. This makes bones more brittle and fragile and more likely to break (fracture),even with minor falls. The goal of treatment is to strengthen your bones and lower your risk for a fracture. Include calcium and vitamin D in your diet. Calcium is important for bone health, and vitamin D helps your body absorb calcium. Talk with your health care provider about screening for osteoporosis if you are a woman who is age 61 or older, or a man who is age 61 or older. This information is not intended to replace advice given to you by your health care provider. Make sure you discuss any questions you have with your health care provider. Document Revised: 01/23/2020 Document Reviewed: 01/23/2020 Elsevier Patient Education  [place].

## 2022-05-13 ENCOUNTER — Other Ambulatory Visit: Payer: Self-pay | Admitting: *Deleted

## 2022-05-13 ENCOUNTER — Telehealth: Payer: Self-pay | Admitting: *Deleted

## 2022-05-13 ENCOUNTER — Encounter: Payer: Self-pay | Admitting: Obstetrics and Gynecology

## 2022-05-13 DIAGNOSIS — M1990 Unspecified osteoarthritis, unspecified site: Secondary | ICD-10-CM

## 2022-05-13 LAB — COMPREHENSIVE METABOLIC PANEL
AG Ratio: 1.7 (calc) (ref 1.0–2.5)
ALT: 13 U/L (ref 6–29)
AST: 18 U/L (ref 10–35)
Albumin: 4.3 g/dL (ref 3.6–5.1)
Alkaline phosphatase (APISO): 74 U/L (ref 37–153)
BUN: 21 mg/dL (ref 7–25)
CO2: 30 mmol/L (ref 20–32)
Calcium: 9.5 mg/dL (ref 8.6–10.4)
Chloride: 101 mmol/L (ref 98–110)
Creat: 0.98 mg/dL (ref 0.50–1.05)
Globulin: 2.5 g/dL (calc) (ref 1.9–3.7)
Glucose, Bld: 90 mg/dL (ref 65–99)
Potassium: 4.8 mmol/L (ref 3.5–5.3)
Sodium: 140 mmol/L (ref 135–146)
Total Bilirubin: 0.4 mg/dL (ref 0.2–1.2)
Total Protein: 6.8 g/dL (ref 6.1–8.1)

## 2022-05-13 LAB — CBC
HCT: 38 % (ref 35.0–45.0)
Hemoglobin: 13 g/dL (ref 11.7–15.5)
MCH: 32.5 pg (ref 27.0–33.0)
MCHC: 34.2 g/dL (ref 32.0–36.0)
MCV: 95 fL (ref 80.0–100.0)
MPV: 9.2 fL (ref 7.5–12.5)
Platelets: 278 10*3/uL (ref 140–400)
RBC: 4 10*6/uL (ref 3.80–5.10)
RDW: 13.9 % (ref 11.0–15.0)
WBC: 6.2 10*3/uL (ref 3.8–10.8)

## 2022-05-13 LAB — PHOSPHORUS: Phosphorus: 4.9 mg/dL — ABNORMAL HIGH (ref 2.5–4.5)

## 2022-05-13 LAB — MAGNESIUM: Magnesium: 2.2 mg/dL (ref 1.5–2.5)

## 2022-05-13 LAB — TSH: TSH: 1.18 mIU/L (ref 0.40–4.50)

## 2022-05-13 NOTE — Telephone Encounter (Signed)
Patient had dexa consult on 05/12/22

## 2022-05-13 NOTE — Telephone Encounter (Signed)
Referral placed at [facility] they will call to schedule.

## 2022-05-13 NOTE — Telephone Encounter (Signed)
-----   Message from Salvadore Dom, MD sent at 05/12/2022  4:51 PM EDT ----- Please place referral for Rheumatology, has arthritis, nodules on her fingers.  Thanks, Sharee Pimple

## 2022-05-16 ENCOUNTER — Other Ambulatory Visit: Payer: Self-pay

## 2022-05-16 ENCOUNTER — Other Ambulatory Visit: Payer: 59

## 2022-05-16 ENCOUNTER — Ambulatory Visit (INDEPENDENT_AMBULATORY_CARE_PROVIDER_SITE_OTHER): Payer: 59 | Admitting: Radiology

## 2022-05-16 ENCOUNTER — Encounter: Payer: Self-pay | Admitting: Radiology

## 2022-05-16 VITALS — BP 138/80

## 2022-05-16 DIAGNOSIS — E559 Vitamin D deficiency, unspecified: Secondary | ICD-10-CM | POA: Diagnosis not present

## 2022-05-16 DIAGNOSIS — R7309 Other abnormal glucose: Secondary | ICD-10-CM | POA: Diagnosis not present

## 2022-05-16 DIAGNOSIS — G620 Drug-induced polyneuropathy: Secondary | ICD-10-CM | POA: Diagnosis not present

## 2022-05-16 DIAGNOSIS — D6481 Anemia due to antineoplastic chemotherapy: Secondary | ICD-10-CM | POA: Diagnosis not present

## 2022-05-16 DIAGNOSIS — R102 Pelvic and perineal pain: Secondary | ICD-10-CM

## 2022-05-16 DIAGNOSIS — Z79810 Long term (current) use of selective estrogen receptor modulators (SERMs): Secondary | ICD-10-CM | POA: Diagnosis not present

## 2022-05-16 LAB — URINALYSIS, COMPLETE W/RFL CULTURE
Bacteria, UA: NONE SEEN /HPF
Bilirubin Urine: NEGATIVE
Glucose, UA: NEGATIVE
Hgb urine dipstick: NEGATIVE
Hyaline Cast: NONE SEEN /LPF
Ketones, ur: NEGATIVE
Leukocyte Esterase: NEGATIVE
Nitrites, Initial: NEGATIVE
Protein, ur: NEGATIVE
RBC / HPF: NONE SEEN /HPF (ref 0–2)
Specific Gravity, Urine: 1.01 (ref 1.001–1.035)
WBC, UA: NONE SEEN /HPF (ref 0–5)
pH: 7 (ref 5.0–8.0)

## 2022-05-16 LAB — NO CULTURE INDICATED

## 2022-05-16 NOTE — Progress Notes (Signed)
   Bonnie Ward 10-09-1960 [id number]   History:  61 y.o. G0 presents with c/o of LLQ pain x 5 days. Pain comes and goes, describes as a stabbing sensation. Does have a hx of constipation but reports this feels different. S/p breast CA. Tamoxifen use x 5 years. Denies any urinary complaints or changes to her typical bowel habits.  Gynecologic History Patient's last menstrual period was 01/25/2011.   Contraception/Family planning: post menopausal status Sexually active: yes  Obstetric History OB History  Gravida Para Term Preterm AB Living  0 0 0 0 0 0  SAB IAB Ectopic Multiple Live Births  0 0 0 0 0     The following portions of the patient's history were reviewed and updated as appropriate: allergies, current medications, past family history, past medical history, past social history, past surgical history, and problem list.  Review of Systems Pertinent items noted in HPI and remainder of comprehensive ROS otherwise negative.   Past medical history, past surgical history, family history and social history were all reviewed and documented in the EPIC chart.   Exam:  Vitals:   05/16/22 1138  BP: 138/80   There is no height or weight on file to calculate BMI.  General appearance:  Normal Genitourinary   Inguinal/mons:  Normal without inguinal adenopathy  External genitalia:  Normal appearing vulva with no masses, tenderness, or lesions  BUS/Urethra/Skene's glands:  Normal without masses or exudate  Vagina:  Atrophic  Cervix:  Normal appearing without discharge or lesions  Uterus:  Normal in size, shape and contour.  Mobile, nontender  Adnexa/parametria:     Rt: Normal in size, without masses or tenderness.   Lt: Tender, no masses palpated, stool burden present  Anus and perineum: Normal   Chaperone declined  Assessment/Plan:   1. Pelvic pain  - Urinalysis,Complete w/RFL Culture - US Transvaginal Non-OB; Future     Rubbie Battiest B WHNP-BC 11:46 AM 05/16/2022

## 2022-05-17 ENCOUNTER — Ambulatory Visit (INDEPENDENT_AMBULATORY_CARE_PROVIDER_SITE_OTHER): Payer: 59

## 2022-05-17 ENCOUNTER — Ambulatory Visit: Payer: 59 | Admitting: Radiology

## 2022-05-17 VITALS — BP 110/78

## 2022-05-17 DIAGNOSIS — R102 Pelvic and perineal pain: Secondary | ICD-10-CM

## 2022-05-17 NOTE — Progress Notes (Signed)
   Bonnie Ward 10-16-1960 [id number]   History:  61 y.o. G0 presents for ultrasound evaluation of LLQ pain x 6 days. Pain comes and goes, describes as a stabbing sensation. Does have a hx of constipation but reports this feels different. S/p breast CA. Tamoxifen use x 5 years. Denies any urinary complaints or changes to her typical bowel habits.  Gynecologic History Patient's last menstrual period was 01/25/2011.   Contraception/Family planning: post menopausal status Sexually active: yes  Obstetric History OB History  Gravida Para Term Preterm AB Living  0 0 0 0 0 0  SAB IAB Ectopic Multiple Live Births  0 0 0 0 0     The following portions of the patient's history were reviewed and updated as appropriate: allergies, current medications, past family history, past medical history, past social history, past surgical history, and problem list.  Review of Systems Pertinent items noted in HPI and remainder of comprehensive ROS otherwise negative.   Past medical history, past surgical history, family history and social history were all reviewed and documented in the EPIC chart.   Exam:  Vitals:   05/17/22 1446  BP: 110/78   There is no height or weight on file to calculate BMI.   Indication: LLQ pain   Vaginal u/s: Anteverted uterus normal size and shape 3 small Fibroids   Thin, symmetrical endometrium measuring approx 2.41m No masses or thickening seen   No adnexal masses No free fluid   Impression: Fibroids, otherwise normal GYN u/s  Assessment/Plan:   1. Pelvic pain  Reassured normal gyn u/s F/u with GI     Jocelyn Lowery B WHNP-BC 2:52 PM 05/17/2022

## 2022-05-18 NOTE — Telephone Encounter (Signed)
Patient scheduled on 11/24/21 with Dr.Deveshwar

## 2022-05-19 LAB — PHOSPHORUS: Phosphorus: 4.3 mg/dL (ref 2.5–4.5)

## 2022-05-19 LAB — PTH, INTACT AND CALCIUM
Calcium: 9.1 mg/dL (ref 8.6–10.4)
PTH: 26 pg/mL (ref 16–77)

## 2022-05-19 LAB — VITAMIN D 25 HYDROXY (VIT D DEFICIENCY, FRACTURES): Vit D, 25-Hydroxy: 42 ng/mL (ref 30–100)

## 2022-05-20 ENCOUNTER — Telehealth: Payer: Self-pay | Admitting: *Deleted

## 2022-05-20 NOTE — Telephone Encounter (Addendum)
Reclast instructions   Annual Exam (1 year) 10/22/2021  Called pt to verify insurance coverage / inform pt Reclast is in Process ?yes  Labs must be in 30 days window Serum Creatinine 0.98  DATE 05/12/2022 Serum Calcium    9.5 DATE 05/12/2022  Reference # [id number]   PA needed? Not required reference # [id number]  No ded applies   Cost for Pt? 80/20 $68  Appt 06/06/2022   Order form filled out and faxed  w/MD sig? YES  Infusion will be done at ? [facility] 06/06/2022 @ 9:00  Pt aware? YES  Instructions mailed to pt? YES

## 2022-05-20 NOTE — Telephone Encounter (Signed)
-----   Message from Thamas Jaegers, [place] sent at 05/20/2022 10:13 AM EDT ----- Per Dewitt Hoes set up for reclast.

## 2022-05-22 ENCOUNTER — Encounter: Payer: Self-pay | Admitting: Obstetrics and Gynecology

## 2022-05-22 DIAGNOSIS — R1032 Left lower quadrant pain: Secondary | ICD-10-CM

## 2022-05-22 DIAGNOSIS — R102 Pelvic and perineal pain: Secondary | ICD-10-CM

## 2022-05-23 NOTE — Telephone Encounter (Signed)
Order placed in [place].  I sent patient a message informing her order has been placed and provided phone number for her to call and answer screening questions and schedule CT scan.

## 2022-05-23 NOTE — Telephone Encounter (Unsigned)
The patient has been having LLQ abdominal/pelvic pain, normal gyn ultrasound. Can't get in with GI for over 3 weeks. Please set her up for an abdominal pelvic CT with oral and IV contrast.

## 2022-05-23 NOTE — Telephone Encounter (Signed)
CT scan is scheduled 05/31/2022 at 8:30am.

## 2022-05-26 ENCOUNTER — Other Ambulatory Visit: Payer: 59

## 2022-05-26 ENCOUNTER — Other Ambulatory Visit: Payer: 59 | Admitting: Radiology

## 2022-05-31 ENCOUNTER — Ambulatory Visit
Admission: RE | Admit: 2022-05-31 | Discharge: 2022-05-31 | Disposition: A | Discharge status: Home / Self Care | Payer: 59 | Source: Ambulatory Visit | Attending: Obstetrics and Gynecology | Admitting: Obstetrics and Gynecology

## 2022-05-31 ENCOUNTER — Other Ambulatory Visit: Payer: 59

## 2022-05-31 DIAGNOSIS — K429 Umbilical hernia without obstruction or gangrene: Secondary | ICD-10-CM | POA: Diagnosis not present

## 2022-05-31 DIAGNOSIS — M419 Scoliosis, unspecified: Secondary | ICD-10-CM | POA: Diagnosis not present

## 2022-05-31 DIAGNOSIS — D259 Leiomyoma of uterus, unspecified: Secondary | ICD-10-CM | POA: Diagnosis not present

## 2022-05-31 DIAGNOSIS — Z853 Personal history of malignant neoplasm of breast: Secondary | ICD-10-CM | POA: Diagnosis not present

## 2022-05-31 DIAGNOSIS — R102 Pelvic and perineal pain: Secondary | ICD-10-CM

## 2022-05-31 DIAGNOSIS — R1032 Left lower quadrant pain: Secondary | ICD-10-CM

## 2022-05-31 MED ORDER — IOPAMIDOL (ISOVUE-300) INJECTION 61%
100.0000 mL | Freq: Once | INTRAVENOUS | Status: AC | PRN
  Administered 2022-05-31: 100 mL via INTRAVENOUS

## 2022-06-02 ENCOUNTER — Telehealth: Payer: Self-pay | Admitting: *Deleted

## 2022-06-02 NOTE — Telephone Encounter (Signed)
Please give her the # through [facility] to help her establish care with a primary. This is outside my scope of practice.

## 2022-06-02 NOTE — Telephone Encounter (Signed)
Patient called back she received your my chart message about CT abdomen/pelvis it was noted "aortic atherosclerosis" Patient report she doesn't have a PCP. You are her only provider. Please advise

## 2022-06-02 NOTE — Telephone Encounter (Signed)
Patient informed, patient asked I send a my chart message with list of offices.

## 2022-06-03 ENCOUNTER — Other Ambulatory Visit (HOSPITAL_COMMUNITY): Payer: Self-pay | Admitting: *Deleted

## 2022-06-06 ENCOUNTER — Encounter (HOSPITAL_COMMUNITY): Payer: 59

## 2022-06-16 ENCOUNTER — Encounter: Payer: Self-pay | Admitting: Physician Assistant

## 2022-06-16 ENCOUNTER — Ambulatory Visit: Payer: 59 | Admitting: Physician Assistant

## 2022-06-16 VITALS — BP 118/68 | HR 90 | Ht 66.0 in | Wt 156.2 lb

## 2022-06-16 DIAGNOSIS — R1032 Left lower quadrant pain: Secondary | ICD-10-CM

## 2022-06-16 DIAGNOSIS — K219 Gastro-esophageal reflux disease without esophagitis: Secondary | ICD-10-CM | POA: Diagnosis not present

## 2022-06-16 DIAGNOSIS — Z86010 Personal history of colonic polyps: Secondary | ICD-10-CM

## 2022-06-16 MED ORDER — SUTAB 1479-225-188 MG PO TABS: 1.0000 | ORAL_TABLET | Freq: Once | ORAL | 0 refills | Status: DC

## 2022-06-16 MED ORDER — NA SULFATE-K SULFATE-MG SULF 17.5-3.13-1.6 GM/177ML PO SOLN: 1.0000 | Freq: Once | ORAL | 0 refills | Status: AC

## 2022-06-16 NOTE — Patient Instructions (Signed)
You have been scheduled for an endoscopy and colonoscopy. Please follow the written instructions given to you at your visit today. Please pick up your prep supplies at the pharmacy within the next 1-3 days. If you use inhalers (even only as needed), please bring them with you on the day of your procedure.  _______________________________________________________  If you are age 61 or older, your body mass index should be between 23-30. Your Body mass index is 25.21 kg/m. If this is out of the aforementioned range listed, please consider follow up with your Primary Care Provider.  If you are age 74 or younger, your body mass index should be between 19-25. Your Body mass index is 25.21 kg/m. If this is out of the aformentioned range listed, please consider follow up with your Primary Care Provider.   ________________________________________________________  The [place] [facility] providers would like to encourage you to use [facility] to communicate with providers for non-urgent requests or questions.  Due to long hold times on the telephone, sending your provider a message by [facility] may be a faster and more efficient way to get a response.  Please allow 48 business hours for a response.  Please remember that this is for non-urgent requests.  _______________________________________________________

## 2022-06-16 NOTE — Progress Notes (Signed)
Chief Complaint: LLQ pain  Review of pertinent gastrointestinal problems: 1. Precancerous polyps in colon: colonoscopy Three pre-cancerous ("serrated adenomas" in right colon) removed 2012 [facility].  Repeat colonoscopy Ardis Hughs 09/2013 found no further polyps.  Recall recommended 5 years. 2. GERD related dyspepsia 09/2010 EGD [facility] 2cm HH otherwise normal exam.  Proton pump inhibitors caused constipation.  Trial of H2 blockers in 2015  HPI:    Mrs. Crosby is a 61 year old female with a past medical history as listed below including GERD, breast cancer status post chemo and radiation and others listed below, known to Dr. Ardis Hughs, who presents to clinic today with a complaint of left lower quadrant pain.    10/01/2019 office visit with Dr. Ardis Hughs and at that time discussed reflux which had worsened lately regardless of Omeprazole 40 mg every morning.  That time discussed her history of sessile serrated adenomas removed in 2012 and Dr. Ardis Hughs recommended repeat colonoscopy.  Her medications were changed to Omeprazole 40 mg 30 minutes before dinner and Famotidine 20 mg at bedtime.    05/12/2022 normal CMP and CBC.    05/31/2022 CTAP with contrast showed no acute abnormality, moderate bowel content throughout the colon with moderate bowel content identified in the rectum which can be seen in constipation, calcified uterine fibroids and aortic atherosclerosis.  She was started back on MiraLAX daily to see if it helped.    Today, the patient tells me that most recently about 2 weeks ago she had a sharp stabbing left lower quadrant pain that seemed to intensify then let go, intensify then let go, this occurred every day for a week to the point where she got a CT, but tells me that when she had the CT her symptoms had mostly resolved.  She did start MiraLAX every day which did help her to have a little bit more frequent bowel movements though she was not sure this was really an issue for her in the past.  Most  recently over the past week she has started drinking a mushroom coffee in the morning to help with inflammation and it gave her diarrhea so she has discontinued that.  She has not had a bowel movement in 2 days.  No further left lower quadrant pain.  Denies any musculoskeletal pain/back pain at the same time.  She does regular Pilates and yoga.    Also discusses chronic reflux symptoms, if she comes off of her medications at all for that she has flares, most recently had a flare a couple of weeks ago and now is back on Pepcid at bedtime and an over-the-counter Omeprazole.  If she takes these on a regular basis she tends to do okay, but is worried that the flare caused some damage.    Denies fever, chills, weight loss or blood in her stool.     Past Medical History:  Diagnosis Date   ACID REFLUX DISEASE 07/12/2010   Anxiety    Arachnoid cyst    left frontal lobe   Arthritis    hands   Breast cancer ([facility]) 08/6107   right   Complication of anesthesia 2012   states woke up during colonoscopy with Propofol   Dental crowns present    Difficulty swallowing pills    Glaucoma    Hypothyroidism    Migraines    Nervous stomach    Personal history of chemotherapy 2016   Personal history of radiation therapy 2016   Rt breast   Seasonal allergies    Stress  fracture of foot 12/2014   right    Past Surgical History:  Procedure Laterality Date   BREAST LUMPECTOMY Right    2016   COLONOSCOPY WITH PROPOFOL  10/14/2013   ESOPHAGOGASTRODUODENOSCOPY ENDOSCOPY     LASIK     PORTACATH PLACEMENT Left 01/23/2015   Procedure: INSERTION PORT-A-CATH ;  Surgeon: Fanny Skates, MD;  Location: [facility];  Service: General;  Laterality: Left;   RADIOACTIVE SEED GUIDED PARTIAL MASTECTOMY WITH AXILLARY SENTINEL LYMPH NODE BIOPSY Right 01/23/2015   Procedure: RIGHT PARTIAL MASTECTOMY WITH RADIOACTIVE SEED LOCALIZATION  WITH RIGHT AXILLARY SENTINEL LYMPH NODE BIOPSY;  Surgeon: Fanny Skates, MD;   Location: [facility];  Service: General;  Laterality: Right;   RE-EXCISION OF BREAST LUMPECTOMY Right 02/16/2015   Procedure: RE-EXCISION OF RIGHT  BREAST LUMPECTOMY MARGINS;  Surgeon: Fanny Skates, MD;  Location: [facility];  Service: General;  Laterality: Right;   TONSILLECTOMY  age 33   TOOTH EXTRACTION      Current Outpatient Medications  Medication Sig Dispense Refill   CALCIUM PO Take 1,200 mg by mouth.     citalopram (CELEXA) 10 MG tablet Take 1.5 tablets (15 mg total) by mouth daily. 135 tablet 3   famotidine (PEPCID) 20 MG tablet Take 1 tablet (20 mg total) by mouth at bedtime. 30 tablet 11   gabapentin (NEURONTIN) 300 MG capsule TAKE 1 CAPSULE(300 MG) BY MOUTH AT BEDTIME 90 capsule 3   latanoprost (XALATAN) 0.005 % ophthalmic solution 1 drop at bedtime.     Omega-3 Fatty Acids (FISH OIL) 1000 MG CPDR Take 1 capsule by mouth daily.      No current facility-administered medications for this visit.    Allergies as of 06/16/2022 - Review Complete 06/16/2022  Allergen Reaction Noted   Lortab [hydrocodone-acetaminophen] Anxiety 02/12/2015   Milk-related compounds Diarrhea 01/22/2015   Coreg [carvedilol] Other (See Comments) 11/25/2015    Family History  Problem Relation Age of Onset   Osteoporosis Mother    Irritable bowel syndrome Mother    Hypertension Mother    Hypertension Father    Hyperlipidemia Father    Diabetes Father    Dementia Father    Colon cancer Maternal Grandfather 11   Breast cancer Paternal Aunt     Social History   Socioeconomic History   Marital status: Divorced    Spouse name: Not on file   Number of children: Not on file   Years of education: Not on file   Highest education level: Not on file  Occupational History   Not on file  Tobacco Use   Smoking status: Former    Packs/day: 0.00    Years: 0.00    Total pack years: 0.00    Types: Cigarettes    Quit date: 02/19/2005    Years since quitting: 17.3    Smokeless tobacco: Never  Vaping Use   Vaping Use: Never used  Substance and Sexual Activity   Alcohol use: Yes    Comment: occasionally   Drug use: No   Sexual activity: Not Currently    Partners: Male    Birth control/protection: Post-menopausal  Other Topics Concern   Not on file  Social History Narrative   Hairstylist    Hh of 2    Former smoker   Husband smokes outside   Social Determinants of Radio broadcast assistant Strain: Not on file  Food Insecurity: Not on file  Transportation Needs: Not on file  Physical Activity: Not  on file  Stress: Not on file  Social Connections: Not on file  Intimate Partner Violence: Not on file    Review of Systems:    Constitutional: No weight loss, fever or chills Skin: No rash  Cardiovascular: No chest pain Respiratory: No SOB Gastrointestinal: See HPI and otherwise negative Genitourinary: No dysuria Neurological: No headache, dizziness or syncope Musculoskeletal: No new muscle or joint pain Hematologic: No bleeding Psychiatric: No history of depression or anxiety   Physical Exam:  Vital signs: BP 118/68   Pulse 90   Ht '5\' 6"'$  (1.676 m)   Wt 156 lb 3.2 oz (70.9 kg)   LMP 01/25/2011   SpO2 98%   BMI 25.21 kg/m    Constitutional:   Pleasant Caucasian female appears to be in NAD, Well developed, Well nourished, alert and cooperative Head:  Normocephalic and atraumatic. Eyes:   PEERL, EOMI. No icterus. Conjunctiva pink. Ears:  Normal auditory acuity. Neck:  Supple Throat: Oral cavity and pharynx without inflammation, swelling or lesion.  Respiratory: Respirations even and unlabored. Lungs clear to auscultation bilaterally.   No wheezes, crackles, or rhonchi.  Cardiovascular: Normal S1, S2. No MRG. Regular rate and rhythm. No peripheral edema, cyanosis or pallor.  Gastrointestinal:  Soft, nondistended, nontender. No rebound or guarding. Normal bowel sounds. No appreciable masses or hepatomegaly. Rectal:  Not performed.   Msk:  Symmetrical without gross deformities. Without edema, no deformity or joint abnormality.  Neurologic:  Alert and  oriented x4;  grossly normal neurologically.  Skin:   Dry and intact without significant lesions or rashes. Psychiatric: Demonstrates good judgement and reason without abnormal affect or behaviors.  RELEVANT LABS AND IMAGING: CBC    Component Value Date/Time   WBC 6.2 05/12/2022 1701   RBC 4.00 05/12/2022 1701   HGB 13.0 05/12/2022 1701   HGB 13.8 10/07/2019 1021   HGB 12.5 12/26/2016 0959   HCT 38.0 05/12/2022 1701   HCT 41.0 10/07/2019 1021   HCT 37.1 12/26/2016 0959   PLT 278 05/12/2022 1701   PLT 277 10/07/2019 1021   MCV 95.0 05/12/2022 1701   MCV 97 10/07/2019 1021   MCV 96.1 12/26/2016 0959   MCH 32.5 05/12/2022 1701   MCHC 34.2 05/12/2022 1701   RDW 13.9 05/12/2022 1701   RDW 14.3 10/07/2019 1021   RDW 14.4 12/26/2016 0959   LYMPHSABS 1.8 03/31/2020 1024   LYMPHSABS 1.8 12/26/2016 0959   MONOABS 0.5 03/31/2020 1024   MONOABS 0.4 12/26/2016 0959   EOSABS 0.1 03/31/2020 1024   EOSABS 0.1 12/26/2016 0959   BASOSABS 0.0 03/31/2020 1024   BASOSABS 0.0 12/26/2016 0959    CMP     Component Value Date/Time   NA 140 05/12/2022 1701   NA 140 10/07/2019 1021   NA 142 12/26/2016 0959   K 4.8 05/12/2022 1701   K 4.0 12/26/2016 0959   CL 101 05/12/2022 1701   CO2 30 05/12/2022 1701   CO2 27 12/26/2016 0959   GLUCOSE 90 05/12/2022 1701   GLUCOSE 107 12/26/2016 0959   BUN 21 05/12/2022 1701   BUN 13 10/07/2019 1021   BUN 10.4 12/26/2016 0959   CREATININE 0.98 05/12/2022 1701   CREATININE 0.8 12/26/2016 0959   CALCIUM 9.1 05/16/2022 1206   CALCIUM 8.8 12/26/2016 0959   PROT 6.8 05/12/2022 1701   PROT 6.4 10/07/2019 1021   PROT 6.8 12/26/2016 0959   ALBUMIN 3.9 03/31/2020 1024   ALBUMIN 4.1 10/07/2019 1021   ALBUMIN 3.7 12/26/2016 0959  AST 18 05/12/2022 1701   AST 20 02/28/2019 1234   AST 28 12/26/2016 0959   ALT 13 05/12/2022 1701   ALT  13 02/28/2019 1234   ALT 26 12/26/2016 0959   ALKPHOS 48 03/31/2020 1024   ALKPHOS 78 12/26/2016 0959   BILITOT 0.4 05/12/2022 1701   BILITOT 0.3 10/07/2019 1021   BILITOT 0.3 02/28/2019 1234   BILITOT 0.40 12/26/2016 0959   GFRNONAA >60 03/31/2020 1024   GFRNONAA >60 02/28/2019 1234   GFRAA >60 03/31/2020 1024   GFRAA >60 02/28/2019 1234    Assessment: 1.  Acute left lower quadrant pain: For a week, sharp/stabbing, even woke her from sleep a few times, CT AP with contrast with constipation and otherwise normal, no further pain now; consider relation to constipation seen on CT versus more likely nerve/musculoskeletal pain 2.  History of adenomatous polyps: Last colonoscopy in 2015 recommendations to repeat in 5 years 3.  Chronic GERD: Recent flare when off of her medications, typically controlled on Pepcid and Omeprazole 20 mg each  Plan: 1.  Scheduled patient for a surveillance colonoscopy in the [facility] given her history of adenomatous polyps as well as a diagnostic EGD given chronic reflux with recent flare, scheduled with Dr. [name] as she requested a female physician in Dr. Ardis Hughs absence.  Did provide the patient a detailed list of risks for the procedures and she agrees to proceed. Patient is appropriate for endoscopic procedure(s) in the ambulatory ([place] [facility]) setting.  2.  For now continue Pepcid and Omeprazole 3.  Discussed left lower quadrant pain, this is gone now, it sounds more like a nerve pain in how sharp/burning it was and how it throbbed. 4.  Discussed continuing MiraLAX as needed for constipation. 5.  Patient requests Su tabs. 6.  Patient to follow in clinic per recommendations after procedure.  She does want it known that she was partially awake during last colonoscopy and was hearing what was being said in the room.  She would prefer to be fully asleep.  Ellouise Newer, PA-C [facility] 06/16/2022, 9:04 AM  Cc: No ref. provider found

## 2022-06-16 NOTE — Addendum Note (Signed)
Addended by: Horris Latino on: 06/16/2022 09:49 AM   Modules accepted: Orders

## 2022-06-16 NOTE — Progress Notes (Signed)
I agree with the assessment and plan as outlined by Ms. Lemmon. 

## 2022-06-17 DIAGNOSIS — Z7689 Persons encountering health services in other specified circumstances: Secondary | ICD-10-CM | POA: Diagnosis not present

## 2022-06-17 DIAGNOSIS — I700 Atherosclerosis of aorta: Secondary | ICD-10-CM | POA: Diagnosis not present

## 2022-06-17 DIAGNOSIS — Z853 Personal history of malignant neoplasm of breast: Secondary | ICD-10-CM | POA: Diagnosis not present

## 2022-06-17 DIAGNOSIS — Z8679 Personal history of other diseases of the circulatory system: Secondary | ICD-10-CM | POA: Diagnosis not present

## 2022-06-17 DIAGNOSIS — M810 Age-related osteoporosis without current pathological fracture: Secondary | ICD-10-CM | POA: Diagnosis not present

## 2022-06-23 ENCOUNTER — Encounter: Payer: Self-pay | Admitting: Internal Medicine

## 2022-06-23 ENCOUNTER — Ambulatory Visit (AMBULATORY_SURGERY_CENTER): Payer: 59 | Admitting: Internal Medicine

## 2022-06-23 VITALS — BP 131/73 | HR 68 | Temp 96.9°F | Resp 17 | Ht 66.0 in | Wt 156.0 lb

## 2022-06-23 DIAGNOSIS — K222 Esophageal obstruction: Secondary | ICD-10-CM

## 2022-06-23 DIAGNOSIS — D125 Benign neoplasm of sigmoid colon: Secondary | ICD-10-CM

## 2022-06-23 DIAGNOSIS — K635 Polyp of colon: Secondary | ICD-10-CM

## 2022-06-23 DIAGNOSIS — K621 Rectal polyp: Secondary | ICD-10-CM

## 2022-06-23 DIAGNOSIS — D123 Benign neoplasm of transverse colon: Secondary | ICD-10-CM

## 2022-06-23 DIAGNOSIS — K449 Diaphragmatic hernia without obstruction or gangrene: Secondary | ICD-10-CM | POA: Diagnosis not present

## 2022-06-23 DIAGNOSIS — D122 Benign neoplasm of ascending colon: Secondary | ICD-10-CM

## 2022-06-23 DIAGNOSIS — K219 Gastro-esophageal reflux disease without esophagitis: Secondary | ICD-10-CM

## 2022-06-23 DIAGNOSIS — Z86010 Personal history of colonic polyps: Secondary | ICD-10-CM

## 2022-06-23 DIAGNOSIS — D128 Benign neoplasm of rectum: Secondary | ICD-10-CM

## 2022-06-23 DIAGNOSIS — Z09 Encounter for follow-up examination after completed treatment for conditions other than malignant neoplasm: Secondary | ICD-10-CM | POA: Diagnosis not present

## 2022-06-23 MED ORDER — SODIUM CHLORIDE 0.9 % IV SOLN: 500.0000 mL | Freq: Once | INTRAVENOUS | Status: DC

## 2022-06-23 NOTE — Op Note (Signed)
[facility] Patient Name: Bonnie Ward Procedure Date: 06/23/2022 1:51 PM MRN: [id number] Endoscopist: [name] Co , , [id number] Age: 61 Referring MD:  Date of Birth: 1961-05-29 Gender: Female Account #: [id number] Procedure:                Upper GI endoscopy Indications:              Heartburn Medicines:                Monitored Anesthesia Care Procedure:                Pre-Anesthesia Assessment:                           - Prior to the procedure, a History and Physical                            was performed, and patient medications and                            allergies were reviewed. The patient's tolerance of                            previous anesthesia was also reviewed. The risks                            and benefits of the procedure and the sedation                            options and risks were discussed with the patient.                            All questions were answered, and informed consent                            was obtained. Prior Anticoagulants: The patient has                            taken no anticoagulant or antiplatelet agents. ASA                            Grade Assessment: II - A patient with mild systemic                            disease. After reviewing the risks and benefits,                            the patient was deemed in satisfactory condition to                            undergo the procedure.                           After obtaining informed consent, the endoscope was  passed under direct vision. Throughout the                            procedure, the patient's blood pressure, pulse, and                            oxygen saturations were monitored continuously. The                            Endoscope was introduced through the mouth, and                            advanced to the second part of duodenum. The upper                            GI endoscopy was accomplished without  difficulty.                            The patient tolerated the procedure well. Scope In: Scope Out: Findings:                 Biopsies were taken with a cold forceps in the                            proximal esophagus and in the distal esophagus for                            histology.                           A non-obstructing Schatzki ring was found at the                            gastroesophageal junction.                           A 2 cm hiatal hernia was present.                           Localized mildly erythematous mucosa without                            bleeding was found in the gastric antrum. Biopsies                            were taken with a cold forceps for histology.                           The examined duodenum was normal. Complications:            No immediate complications. Estimated Blood Loss:     Estimated blood loss was minimal. Impression:               - Biopsies were taken with a cold forceps for  histology in the proximal esophagus and in the                            distal esophagus.                           - Non-obstructing Schatzki ring.                           - 2 cm hiatal hernia.                           - Erythematous mucosa in the antrum. Biopsied.                           - Normal examined duodenum. Recommendation:           - Await pathology results.                           - Perform a colonoscopy today. Dr [name] Co "[name]" [name],  06/23/2022 2:30:30 PM

## 2022-06-23 NOTE — Op Note (Signed)
[facility] Patient Name: Bonnie Ward Procedure Date: 06/23/2022 1:50 PM MRN: [id number] Endoscopist: [name] Co , , [id number] Age: 61 Referring MD:  Date of Birth: [birth year] Gender: Female Account #: [id number] Procedure:                Colonoscopy Indications:              High risk colon cancer surveillance: Personal                            history of colonic polyps Medicines:                Monitored Anesthesia Care Procedure:                Pre-Anesthesia Assessment:                           - Prior to the procedure, a History and Physical                            was performed, and patient medications and                            allergies were reviewed. The patient's tolerance of                            previous anesthesia was also reviewed. The risks                            and benefits of the procedure and the sedation                            options and risks were discussed with the patient.                            All questions were answered, and informed consent                            was obtained. Prior Anticoagulants: The patient has                            taken no anticoagulant or antiplatelet agents. ASA                            Grade Assessment: II - A patient with mild systemic                            disease. After reviewing the risks and benefits,                            the patient was deemed in satisfactory condition to                            undergo the procedure.  After obtaining informed consent, the colonoscope                            was passed under direct vision. Throughout the                            procedure, the patient's blood pressure, pulse, and                            oxygen saturations were monitored continuously. The                            [id number]-HQ190L Colonoscope was introduced through the                            anus and advanced to the the  terminal ileum. The                            colonoscopy was performed without difficulty. The                            patient tolerated the procedure well. The quality                            of the bowel preparation was good. The terminal                            ileum, ileocecal valve, appendiceal orifice, and                            rectum were photographed. Scope In: 2:04:11 PM Scope Out: 2:24:04 PM Scope Withdrawal Time: 0 hours 10 minutes 54 seconds  Total Procedure Duration: 0 hours 19 minutes 53 seconds  Findings:                 The terminal ileum appeared normal.                           Three sessile polyps were found in the transverse                            colon and ascending colon. The polyps were 3 to 6                            mm in size. These polyps were removed with a cold                            snare. Resection and retrieval were complete.                           Multiple diverticula were found in the sigmoid                            colon.  Three sessile polyps were found in the rectum and                            sigmoid colon. The polyps were 3 to 5 mm in size.                            These polyps were removed with a cold snare.                            Resection and retrieval were complete.                           Non-bleeding internal hemorrhoids were found during                            retroflexion. Complications:            No immediate complications. Estimated Blood Loss:     Estimated blood loss was minimal. Impression:               - The examined portion of the ileum was normal.                           - Three 3 to 6 mm polyps in the transverse colon                            and in the ascending colon, removed with a cold                            snare. Resected and retrieved.                           - Diverticulosis in the sigmoid colon.                           - Three 3 to 5 mm  polyps in the rectum and in the                            sigmoid colon, removed with a cold snare. Resected                            and retrieved.                           - Non-bleeding internal hemorrhoids. Recommendation:           - Discharge patient to home (with escort).                           - Await pathology results.                           - The findings and recommendations were discussed  with the patient. Dr [name] Co "[name]" [name],  06/23/2022 2:33:50 PM

## 2022-06-23 NOTE — Progress Notes (Signed)
GASTROENTEROLOGY PROCEDURE H&P NOTE   Primary Care Physician: Patient, No Pcp Per    Reason for Procedure:   GERD, history of colon polyps  Plan:    EGD/colonoscopy  Patient is appropriate for endoscopic procedure(s) in the ambulatory ([facility]) setting.  The nature of the procedure, as well as the risks, benefits, and alternatives were carefully and thoroughly reviewed with the patient. Ample time for discussion and questions allowed. The patient understood, was satisfied, and agreed to proceed.     HPI: Bonnie Ward is a 60 y.o. female who presents for EGD/colonoscopy for evaluation of GERD and history of colon polyps .  Patient was most recently seen in the Gastroenterology Clinic on 06/16/22.  No interval change in medical history since that appointment. Please refer to that note for full details regarding GI history and clinical presentation.   Past Medical History:  Diagnosis Date   ACID REFLUX DISEASE 07/12/2010   Anxiety    Arachnoid cyst    left frontal lobe   Arthritis    hands   Breast cancer ([place]) 09/8313   right   Complication of anesthesia 2012   states woke up during colonoscopy with Propofol   Dental crowns present    Difficulty swallowing pills    Glaucoma    Hypothyroidism    Migraines    Nervous stomach    Personal history of chemotherapy 2016   Personal history of radiation therapy 2016   Rt breast   Seasonal allergies    Stress fracture of foot 12/2014   right    Past Surgical History:  Procedure Laterality Date   BREAST LUMPECTOMY Right    2016   COLONOSCOPY WITH PROPOFOL  10/14/2013   ESOPHAGOGASTRODUODENOSCOPY ENDOSCOPY     LASIK     PORTACATH PLACEMENT Left 01/23/2015   Procedure: INSERTION PORT-A-CATH ;  Surgeon: Fanny Skates, MD;  Location: [facility];  Service: General;  Laterality: Left;   RADIOACTIVE SEED GUIDED PARTIAL MASTECTOMY WITH AXILLARY SENTINEL LYMPH NODE BIOPSY Right 01/23/2015   Procedure: RIGHT PARTIAL  MASTECTOMY WITH RADIOACTIVE SEED LOCALIZATION  WITH RIGHT AXILLARY SENTINEL LYMPH NODE BIOPSY;  Surgeon: Fanny Skates, MD;  Location: [facility];  Service: General;  Laterality: Right;   RE-EXCISION OF BREAST LUMPECTOMY Right 02/16/2015   Procedure: RE-EXCISION OF RIGHT  BREAST LUMPECTOMY MARGINS;  Surgeon: Fanny Skates, MD;  Location: [facility];  Service: General;  Laterality: Right;   TONSILLECTOMY  age 60   TOOTH EXTRACTION      Prior to Admission medications   Medication Sig Start Date End Date Taking? Authorizing Provider  citalopram (CELEXA) 10 MG tablet Take 1.5 tablets (15 mg total) by mouth daily. 10/22/21  Yes Salvadore Dom, MD  gabapentin (NEURONTIN) 300 MG capsule TAKE 1 CAPSULE(300 MG) BY MOUTH AT BEDTIME 10/22/21  Yes Salvadore Dom, MD  latanoprost (XALATAN) 0.005 % ophthalmic solution 1 drop at bedtime.   Yes [provider]  omeprazole (PRILOSEC) 20 MG capsule Take 20 mg by mouth daily.   Yes [provider]  CALCIUM PO Take 1,200 mg by mouth. Patient not taking: Reported on 06/23/2022    [provider]  famotidine (PEPCID) 20 MG tablet Take 1 tablet (20 mg total) by mouth at bedtime. Patient not taking: Reported on 06/16/2022 10/01/19   Milus Banister, MD  Omega-3 Fatty Acids (FISH OIL) 1000 MG CPDR Take 1 capsule by mouth daily.  Patient not taking: Reported on 06/23/2022  [provider]    Current Outpatient Medications  Medication Sig Dispense Refill   citalopram (CELEXA) 10 MG tablet Take 1.5 tablets (15 mg total) by mouth daily. 135 tablet 3   gabapentin (NEURONTIN) 300 MG capsule TAKE 1 CAPSULE(300 MG) BY MOUTH AT BEDTIME 90 capsule 3   latanoprost (XALATAN) 0.005 % ophthalmic solution 1 drop at bedtime.     omeprazole (PRILOSEC) 20 MG capsule Take 20 mg by mouth daily.     CALCIUM PO Take 1,200 mg by mouth. (Patient not taking: Reported on 06/23/2022)     famotidine (PEPCID) 20 MG  tablet Take 1 tablet (20 mg total) by mouth at bedtime. (Patient not taking: Reported on 06/16/2022) 30 tablet 11   Omega-3 Fatty Acids (FISH OIL) 1000 MG CPDR Take 1 capsule by mouth daily.  (Patient not taking: Reported on 06/23/2022)     Current Facility-Administered Medications  Medication Dose Route Frequency Provider Last Rate Last Admin   0.9 %  sodium chloride infusion  500 mL Intravenous Once Sharyn Creamer, MD        Allergies as of 06/23/2022 - Review Complete 06/23/2022  Allergen Reaction Noted   Lortab [hydrocodone-acetaminophen] Anxiety 02/12/2015   Milk-related compounds Diarrhea 01/22/2015   Coreg [carvedilol] Other (See Comments) 11/25/2015    Family History  Problem Relation Age of Onset   Osteoporosis Mother    Irritable bowel syndrome Mother    Hypertension Mother    Hypertension Father    Hyperlipidemia Father    Diabetes Father    Dementia Father    Breast cancer Paternal Aunt    Colon cancer Maternal Grandfather 21   Esophageal cancer Neg Hx    Stomach cancer Neg Hx    Rectal cancer Neg Hx     Social History   Socioeconomic History   Marital status: Divorced    Spouse name: Not on file   Number of children: Not on file   Years of education: Not on file   Highest education level: Not on file  Occupational History   Not on file  Tobacco Use   Smoking status: Former    Packs/day: 0.00    Years: 0.00    Total pack years: 0.00    Types: Cigarettes    Quit date: 02/19/2005    Years since quitting: 17.3   Smokeless tobacco: Never  Vaping Use   Vaping Use: Never used  Substance and Sexual Activity   Alcohol use: Yes    Comment: occasionally   Drug use: No   Sexual activity: Not Currently    Partners: Male    Birth control/protection: Post-menopausal  Other Topics Concern   Not on file  Social History Narrative   Hairstylist    Hh of 2    Former smoker   Husband smokes outside   Social Determinants of Radio broadcast assistant Strain:  Not on file  Food Insecurity: Not on file  Transportation Needs: Not on file  Physical Activity: Not on file  Stress: Not on file  Social Connections: Not on file  Intimate Partner Violence: Not on file    Physical Exam: Vital signs in last 24 hours: BP 112/68   Pulse 74   Temp (!) 96.9 F (36.1 C) (Temporal)   Ht '5\' 6"'$  (1.676 m)   Wt 156 lb (70.8 kg)   LMP 01/25/2011   SpO2 100%   BMI 25.18 kg/m  GEN: NAD EYE: Sclerae anicteric ENT: MMM CV: Non-tachycardic Pulm: No increased  WOB GI: Soft NEURO:  Alert & Oriented   Christia Reading, MD [facility]   06/23/2022 1:45 PM

## 2022-06-23 NOTE — Patient Instructions (Signed)
Resume previous medications.  6 polyps removed and sent to pathology.  Biopsies taken of stomach and esophagus. Await results for final recommendations.    Handouts on findings given to patient.  (Gastritis, polyps, hemorrhoids and diverticulosis)  YOU HAD AN ENDOSCOPIC PROCEDURE TODAY AT [facility]:   Refer to the procedure report that was given to you for any specific questions about what was found during the examination.  If the procedure report does not answer your questions, please call your gastroenterologist to clarify.  If you requested that your care partner not be given the details of your procedure findings, then the procedure report has been included in a sealed envelope for you to review at your convenience later.  YOU SHOULD EXPECT: Some feelings of bloating in the abdomen. Passage of more gas than usual.  Walking can help get rid of the air that was put into your GI tract during the procedure and reduce the bloating. If you had a lower endoscopy (such as a colonoscopy or flexible sigmoidoscopy) you may notice spotting of blood in your stool or on the toilet paper. If you underwent a bowel prep for your procedure, you may not have a normal bowel movement for a few days.  Please Note:  You might notice some irritation and congestion in your nose or some drainage.  This is from the oxygen used during your procedure.  There is no need for concern and it should clear up in a day or so.  SYMPTOMS TO REPORT IMMEDIATELY:  Following lower endoscopy (colonoscopy or flexible sigmoidoscopy):  Excessive amounts of blood in the stool  Significant tenderness or worsening of abdominal pains  Swelling of the abdomen that is new, acute  Fever of 100F or higher  Following upper endoscopy (EGD)  Vomiting of blood or coffee ground material  New chest pain or pain under the shoulder blades  Painful or persistently difficult swallowing  New shortness of breath  Fever of 100F or  higher  Black, tarry-looking stools  For urgent or emergent issues, a gastroenterologist can be reached at any hour by calling [phone]. Do not use MyChart messaging for urgent concerns.    DIET:  We do recommend a small meal at first, but then you may proceed to your regular diet.  Drink plenty of fluids but you should avoid alcoholic beverages for 24 hours.  ACTIVITY:  You should plan to take it easy for the rest of today and you should NOT DRIVE or use heavy machinery until tomorrow (because of the sedation medicines used during the test).    FOLLOW UP: Our staff will call the number listed on your records the next business day following your procedure.  We will call around 7:15- 8:00 am to check on you and address any questions or concerns that you may have regarding the information given to you following your procedure. If we do not reach you, we will leave a message.     If any biopsies were taken you will be contacted by phone or by letter within the next 1-3 weeks.  Please call us at [phone] if you have not heard about the biopsies in 3 weeks.    SIGNATURES/CONFIDENTIALITY: You and/or your care partner have signed paperwork which will be entered into your electronic medical record.  These signatures attest to the fact that that the information above on your After Visit Summary has been reviewed and is understood.  Full responsibility of the confidentiality of this  discharge information lies with you and/or your care-partner. 

## 2022-06-23 NOTE — Progress Notes (Signed)
Pt's states no medical or surgical changes since previsit or office visit. 

## 2022-06-23 NOTE — Progress Notes (Signed)
Report to pacu RN. VSS. Care resumed by RN.

## 2022-06-23 NOTE — Progress Notes (Signed)
Called to room to assist during endoscopic procedure.  Patient ID and intended procedure confirmed with present staff. Received instructions for my participation in the procedure from the performing physician.  

## 2022-06-24 ENCOUNTER — Telehealth: Payer: Self-pay | Admitting: *Deleted

## 2022-06-24 NOTE — Telephone Encounter (Signed)
Attempted f/u phone call. No answer. Mailbox full, unable to leave message.  

## 2022-06-28 ENCOUNTER — Ambulatory Visit: Payer: Self-pay | Admitting: Internal Medicine

## 2022-06-28 ENCOUNTER — Encounter: Payer: Self-pay | Admitting: Internal Medicine

## 2022-07-05 ENCOUNTER — Encounter: Payer: Self-pay | Admitting: Family Medicine

## 2022-07-05 DIAGNOSIS — E559 Vitamin D deficiency, unspecified: Secondary | ICD-10-CM | POA: Diagnosis not present

## 2022-07-05 DIAGNOSIS — M810 Age-related osteoporosis without current pathological fracture: Secondary | ICD-10-CM | POA: Diagnosis not present

## 2022-07-05 DIAGNOSIS — Z853 Personal history of malignant neoplasm of breast: Secondary | ICD-10-CM | POA: Diagnosis not present

## 2022-07-05 DIAGNOSIS — I700 Atherosclerosis of aorta: Secondary | ICD-10-CM | POA: Diagnosis not present

## 2022-07-05 DIAGNOSIS — K219 Gastro-esophageal reflux disease without esophagitis: Secondary | ICD-10-CM | POA: Diagnosis not present

## 2022-07-06 ENCOUNTER — Other Ambulatory Visit: Payer: Self-pay | Admitting: Family Medicine

## 2022-07-06 DIAGNOSIS — Z8679 Personal history of other diseases of the circulatory system: Secondary | ICD-10-CM

## 2022-07-06 DIAGNOSIS — Z853 Personal history of malignant neoplasm of breast: Secondary | ICD-10-CM

## 2022-07-18 ENCOUNTER — Ambulatory Visit
Admission: RE | Admit: 2022-07-18 | Discharge: 2022-07-18 | Disposition: A | Discharge status: Home / Self Care | Payer: No Typology Code available for payment source | Source: Ambulatory Visit | Attending: Family Medicine | Admitting: Family Medicine

## 2022-07-18 DIAGNOSIS — Z8679 Personal history of other diseases of the circulatory system: Secondary | ICD-10-CM

## 2022-08-12 ENCOUNTER — Other Ambulatory Visit: Payer: Self-pay | Admitting: Obstetrics and Gynecology

## 2022-08-12 DIAGNOSIS — F419 Anxiety disorder, unspecified: Secondary | ICD-10-CM

## 2022-08-12 NOTE — Telephone Encounter (Signed)
Pt states she wanted to wait till she has her appointment  with cardiology. Patient will give [place] a call when she would like to proceed with reclast

## 2022-08-17 DIAGNOSIS — R69 Illness, unspecified: Secondary | ICD-10-CM | POA: Diagnosis not present

## 2022-08-17 DIAGNOSIS — T451X5A Adverse effect of antineoplastic and immunosuppressive drugs, initial encounter: Secondary | ICD-10-CM | POA: Diagnosis not present

## 2022-08-17 DIAGNOSIS — G4700 Insomnia, unspecified: Secondary | ICD-10-CM | POA: Diagnosis not present

## 2022-08-17 DIAGNOSIS — M810 Age-related osteoporosis without current pathological fracture: Secondary | ICD-10-CM | POA: Diagnosis not present

## 2022-08-17 DIAGNOSIS — K219 Gastro-esophageal reflux disease without esophagitis: Secondary | ICD-10-CM | POA: Diagnosis not present

## 2022-08-17 DIAGNOSIS — G620 Drug-induced polyneuropathy: Secondary | ICD-10-CM | POA: Diagnosis not present

## 2022-08-23 ENCOUNTER — Ambulatory Visit (HOSPITAL_COMMUNITY)
Admission: RE | Admit: 2022-08-23 | Discharge: 2022-08-23 | Disposition: A | Discharge status: Home / Self Care | Payer: 59 | Source: Ambulatory Visit | Attending: Cardiology | Admitting: Cardiology

## 2022-08-23 ENCOUNTER — Encounter (HOSPITAL_COMMUNITY): Payer: Self-pay | Admitting: Cardiology

## 2022-08-23 VITALS — BP 102/60 | HR 80 | Wt 153.0 lb

## 2022-08-23 DIAGNOSIS — T451X5A Adverse effect of antineoplastic and immunosuppressive drugs, initial encounter: Secondary | ICD-10-CM | POA: Diagnosis not present

## 2022-08-23 DIAGNOSIS — Z9011 Acquired absence of right breast and nipple: Secondary | ICD-10-CM | POA: Diagnosis not present

## 2022-08-23 DIAGNOSIS — Z9221 Personal history of antineoplastic chemotherapy: Secondary | ICD-10-CM | POA: Diagnosis not present

## 2022-08-23 DIAGNOSIS — Z170 Estrogen receptor positive status [ER+]: Secondary | ICD-10-CM | POA: Diagnosis not present

## 2022-08-23 DIAGNOSIS — Z853 Personal history of malignant neoplasm of breast: Secondary | ICD-10-CM | POA: Insufficient documentation

## 2022-08-23 DIAGNOSIS — I427 Cardiomyopathy due to drug and external agent: Secondary | ICD-10-CM | POA: Insufficient documentation

## 2022-08-23 DIAGNOSIS — Z8249 Family history of ischemic heart disease and other diseases of the circulatory system: Secondary | ICD-10-CM | POA: Insufficient documentation

## 2022-08-23 DIAGNOSIS — C50411 Malignant neoplasm of upper-outer quadrant of right female breast: Secondary | ICD-10-CM | POA: Diagnosis not present

## 2022-08-23 DIAGNOSIS — R9431 Abnormal electrocardiogram [ECG] [EKG]: Secondary | ICD-10-CM | POA: Insufficient documentation

## 2022-08-23 DIAGNOSIS — I700 Atherosclerosis of aorta: Secondary | ICD-10-CM | POA: Insufficient documentation

## 2022-08-23 DIAGNOSIS — Z87891 Personal history of nicotine dependence: Secondary | ICD-10-CM | POA: Insufficient documentation

## 2022-08-23 LAB — LIPID PANEL
Cholesterol: 222 mg/dL — ABNORMAL HIGH (ref 0–200)
HDL: 62 mg/dL (ref >40)
LDL Cholesterol: 129 mg/dL — ABNORMAL HIGH (ref 0–99)
Total CHOL/HDL Ratio: 3.6 RATIO
Triglycerides: 153 mg/dL — ABNORMAL HIGH (ref <150)
VLDL: 31 mg/dL (ref 0–40)

## 2022-08-23 NOTE — Patient Instructions (Signed)
There has been no changes to your medications.  Labs done today, your results will be available in MyChart, we will contact you for abnormal readings.  Your physician recommends that you schedule a follow-up appointment in: As needed.  If you have any questions or concerns before your next appointment please send [place] a message through [place] or call our office at [phone].    TO LEAVE A MESSAGE FOR THE NURSE SELECT OPTION 2, PLEASE LEAVE A MESSAGE INCLUDING: YOUR NAME DATE OF BIRTH CALL BACK NUMBER REASON FOR CALL**this is important as we prioritize the call backs  YOU WILL RECEIVE A CALL BACK THE SAME DAY AS LONG AS YOU CALL BEFORE 4:00 PM  At the [facility], you and your health needs are our priority. As part of our continuing mission to provide you with exceptional heart care, we have created designated Provider Care Teams. These Care Teams include your primary Cardiologist (physician) and Advanced Practice Providers (APPs- Physician Assistants and Nurse Practitioners) who all work together to provide you with the care you need, when you need it.   You may see any of the following providers on your designated Care Team at your next follow up: Dr Glori Bickers Dr Loralie Champagne Dr. Roxana Hires, NP Lyda Jester, [place] [facility] [place], [place] Forestine Na, NP Audry Riles, PharmD   Please be sure to bring in all your medications bottles to every appointment.

## 2022-08-23 NOTE — Progress Notes (Signed)
Patient ID: Bonnie Ward, female   DOB: 1961-06-09, 62 y.o.   MRN: [id number] PCP: Dr. Ernie Hew  62 y.o. who I saw in the past (last visit in 2017) for cardio-oncology evaluation with triple receptor positive breast cancer returns was referred by Dr. Ernie Hew for evaluation of aortic atherosclerosis.    She has had no recurrence of her breast cancer.  Cancer was diagnosed in 5/16.  She is s/p right partial mastectomy.  Cancer was ER+/PR+/HER2+.  She had chemotherapy with doxorubicin and cyclophosphamide completed 04/16/2015, followed by paclitaxel weekly starting 04/30/2015.  Paclitaxel was stopped due to peripheral neuropathy.  Herceptin held after 12/16 echo then restarted to complete her course.   She had atypical chest pain in 7/17.  ETT-Cardiolite showed a fixed septal defect, likely artifact (low risk).   She has done well from a cardiac standpoint in recent years.  She had a CT abdomen/pelvis done for abdominal complaints, this showed aortic atherosclerosis leading to concern for vascular risk.  She subsequently had a coronary calcium score scan done with calcium score 0 Agatston units.  She has no exertional chest pain or tightness.  She has occasional reflux after meals improved with omeprazole.  She is quite active with no exertional dyspnea.  She does spin classes and hot yoga, teaches Pilates.  No limitations. Occasional palpitations, no lightheadedness.   Labs (12/16): K 4.1, creatinine 0.8  Labs (9/23): K 4.8, creatinine 0.98  ECG: NSR, QTc mildly prolonged 491 msec  PMH: 1. GERD 2. IBS 3. Recurrent UTIs 4. Breast cancer: ER+/PR+/HER2+.  Diagnosed 5/16, had partial right mastectomy in 6/16.  - Echo (6/16) with EF 55-60%, normal RV, lateral s' 14 cm/sec, GLS -23.3%.  - Doxorubicin/cyclophosphamide until 8/16.  - Paclitaxel until 10/16.  - Herceptin ongoing.  - Echo (12/16) with EF 50%, mild LV dilation, GLS -19.9%.  - Echo (2/17) with EF 55%, lateral s' 9.6, GLS -91.6%, normal  diastolic function, normal RV size and systolic function.  - Echo (3/17) with EF 55%, lateral s' 11.9, GLS -38.4%, normal diastolic function, normal RV size and systolic function.  - Echo (7/17): I reviewed, think EF unchanged at 55%, GLS -19.3%.  - Echo (10/17): EF 66%, normal diastolic function, GLS -59.9%, normal RV size and systolic function.  5. Neuropathy from paclitaxel. 6. Chest pain: ETT-Cardiolite (7/17) with EF 54%, fixed septal defect, likely artifact.   7. Vascular risk: - Abdomina/pelvis CT 10/23: Aortic atherosclerosis.  - Coronary calcium score scan (11/23): Agatston units.   SH: Quit smoking in 2016, works as Haematologist, rare ETOH.   FH: Uncle with MI in his 38s. Grandfather with MI in his 58s.   ROS: All systems reviewed and negative except as per HPI.   Current Outpatient Medications  Medication Sig Dispense Refill   gabapentin (NEURONTIN) 300 MG capsule TAKE 1 CAPSULE(300 MG) BY MOUTH AT BEDTIME 90 capsule 3   latanoprost (XALATAN) 0.005 % ophthalmic solution 1 drop at bedtime.     Omega-3 Fatty Acids (FISH OIL) 1000 MG CPDR Take 1 capsule by mouth daily.     omeprazole (PRILOSEC) 20 MG capsule Take 20 mg by mouth daily.     sertraline (ZOLOFT) 25 MG tablet Take 12.5 mg by mouth daily.     No current facility-administered medications for this encounter.   BP 102/60   Pulse 80   Wt 69.4 kg (153 lb)   LMP 01/25/2011   SpO2 98%   BMI 24.69 kg/m  General: NAD Neck: No JVD, no  thyromegaly or thyroid nodule.  Lungs: Clear to auscultation bilaterally with normal respiratory effort. CV: Nondisplaced PMI.  Heart regular S1/S2, no S3/S4, no murmur.  No peripheral edema.  No carotid bruit.  Normal pedal pulses.  Abdomen: Soft, nontender, no hepatosplenomegaly, no distention.  Skin: Intact without lesions or rashes.  Neurologic: Alert and oriented x 3.  Psych: Normal affect. Extremities: No clubbing or cyanosis.  HEENT: Normal.   Assessment/Plan: 1. Chemotherapy  cardiotoxicity: Remote. She had doxorubicin and cyclophosphamide until 8/16 and then Herceptin until 9/17.  Baseline echo prior to treatment was normal. Herceptin was held after 12/16 echo with mild fall in EF but EF recovered on subsequent echoes and she completed Herceptin without further complications.  2. Chest pain: Low risk Cardiolite in 7/17.  No further chest pain. 3. Vascular risk: She was noted to have aortic atherosclerosis on 10/23 CT abd/pelvis.  However, coronary calcium score in 11/23 was 0 Agatston units, suggesting low risk.  No family history of premature CAD.  No cardiac symptoms.  - I will check lipids and lipoprotein(a) today to decide on necessity for statin therapy.  4. Mild QT prolongation: Noted on ECG today, K in past has been normal.  Will follow. Avoid QT prolonging agents.   Loralie Champagne 08/23/2022

## 2022-08-24 ENCOUNTER — Telehealth (HOSPITAL_COMMUNITY): Payer: Self-pay

## 2022-08-24 ENCOUNTER — Other Ambulatory Visit (HOSPITAL_COMMUNITY): Payer: Self-pay

## 2022-08-24 DIAGNOSIS — I700 Atherosclerosis of aorta: Secondary | ICD-10-CM

## 2022-08-24 LAB — LIPOPROTEIN A (LPA): Lipoprotein (a): 24.4 nmol/L (ref <75.0)

## 2022-08-24 NOTE — Telephone Encounter (Signed)
Patient aware of results. She does not want to start Crestor at this time, but work on diet and exercise. We set up lab appointment for March to repeat lipids.

## 2022-09-15 ENCOUNTER — Telehealth (HOSPITAL_COMMUNITY): Payer: Self-pay

## 2022-09-15 NOTE — Telephone Encounter (Signed)
Records sent to Dr. Ival Bible office per medical release form.

## 2022-10-03 DIAGNOSIS — E559 Vitamin D deficiency, unspecified: Secondary | ICD-10-CM | POA: Diagnosis not present

## 2022-10-10 DIAGNOSIS — G4700 Insomnia, unspecified: Secondary | ICD-10-CM | POA: Diagnosis not present

## 2022-10-10 DIAGNOSIS — T451X5A Adverse effect of antineoplastic and immunosuppressive drugs, initial encounter: Secondary | ICD-10-CM | POA: Diagnosis not present

## 2022-10-10 DIAGNOSIS — G620 Drug-induced polyneuropathy: Secondary | ICD-10-CM | POA: Diagnosis not present

## 2022-10-10 DIAGNOSIS — E559 Vitamin D deficiency, unspecified: Secondary | ICD-10-CM | POA: Diagnosis not present

## 2022-10-10 DIAGNOSIS — R69 Illness, unspecified: Secondary | ICD-10-CM | POA: Diagnosis not present

## 2022-10-10 DIAGNOSIS — K219 Gastro-esophageal reflux disease without esophagitis: Secondary | ICD-10-CM | POA: Diagnosis not present

## 2022-10-24 ENCOUNTER — Ambulatory Visit: Payer: Self-pay | Admitting: Obstetrics and Gynecology

## 2022-11-14 ENCOUNTER — Other Ambulatory Visit (HOSPITAL_COMMUNITY): Payer: 59

## 2022-11-14 DIAGNOSIS — N941 Unspecified dyspareunia: Secondary | ICD-10-CM | POA: Diagnosis not present

## 2022-11-16 NOTE — Progress Notes (Signed)
62 y.o. G0P0000 Divorced White or Caucasian Not Hispanic or Latino female here for annual exam.    She has a h/o estrogen/progesterone receptor positive breast cancer, diagnosed in 2016. S/p lumpectomy, SLN biopsy. S/P chemo, radiation, and tamoxifen.   Not currently sexually active, had to stop the estring when she went off of tamoxifen. The last time she tried being sexually active it was really painful.   04/18/22 osteoporosis T score -2.5. In 9/23 we drew some labs and discussed reclast. Labs were okay in the fall.    On Gabapentin for nerve pain from her chemo, needs a refill.   She would like to talk about the Reclast injection. She has a h/o reflux, currently controlled on medication. She doesn't want to take the Fosamax.   Patient's last menstrual period was 01/25/2011.          Sexually active: No.  The current method of family planning is coitus interruptus and status post menopausal  Exercising: Yes.    Gym/ health club routine includes light weights, low impact aerobics, and yoga. Smoker:  no  Health Maintenance: Pap:  10/22/21 WNL Hr HPV Neg,  05-01-17 normal History of abnormal Pap:  yes-many years ago, no surgery MMG:  01/05/22 Bi-rads 2 benign  BMD:   04/18/22 osteoporosis T score -2.5.  Colonoscopy: 06/23/22 polyps f/u 3 years  TDaP:  10/22/21 Gardasil: n/a   reports that she quit smoking about 17 years ago. Her smoking use included cigarettes. She has never used smokeless tobacco. She reports current alcohol use. She reports that she does not use drugs. Hairstylist, has 2 dogs, labradoodle and a maltipoo. Teaching palates   Past Medical History:  Diagnosis Date   ACID REFLUX DISEASE 07/12/2010   Anxiety    Arachnoid cyst    left frontal lobe   Arthritis    hands   Breast cancer 0000000   right   Complication of anesthesia 2012   states woke up during colonoscopy with Propofol   Dental crowns present    Difficulty swallowing pills    Glaucoma    Hypothyroidism     Migraines    Nervous stomach    Personal history of chemotherapy 2016   Personal history of radiation therapy 2016   Rt breast   Seasonal allergies    Stress fracture of foot 12/2014   right    Past Surgical History:  Procedure Laterality Date   BREAST LUMPECTOMY Right    2016   COLONOSCOPY WITH PROPOFOL  10/14/2013   ESOPHAGOGASTRODUODENOSCOPY ENDOSCOPY     LASIK     PORTACATH PLACEMENT Left 01/23/2015   Procedure: INSERTION PORT-A-CATH ;  Surgeon: Fanny Skates, MD;  Location: [facility];  Service: General;  Laterality: Left;   RADIOACTIVE SEED GUIDED PARTIAL MASTECTOMY WITH AXILLARY SENTINEL LYMPH NODE BIOPSY Right 01/23/2015   Procedure: RIGHT PARTIAL MASTECTOMY WITH RADIOACTIVE SEED LOCALIZATION  WITH RIGHT AXILLARY SENTINEL LYMPH NODE BIOPSY;  Surgeon: Fanny Skates, MD;  Location: [facility];  Service: General;  Laterality: Right;   RE-EXCISION OF BREAST LUMPECTOMY Right 02/16/2015   Procedure: RE-EXCISION OF RIGHT  BREAST LUMPECTOMY MARGINS;  Surgeon: Fanny Skates, MD;  Location: [facility];  Service: General;  Laterality: Right;   TONSILLECTOMY  age 43   TOOTH EXTRACTION      Current Outpatient Medications  Medication Sig Dispense Refill   gabapentin (NEURONTIN) 300 MG capsule TAKE 1 CAPSULE(300 MG) BY MOUTH AT BEDTIME 90 capsule 3   latanoprost (  XALATAN) 0.005 % ophthalmic solution 1 drop at bedtime.     Omega-3 Fatty Acids (FISH OIL) 1000 MG CPDR Take 1 capsule by mouth daily.     omeprazole (PRILOSEC) 20 MG capsule Take 20 mg by mouth daily.     sertraline (ZOLOFT) 25 MG tablet Take 12.5 mg by mouth daily.     No current facility-administered medications for this visit.    Family History  Problem Relation Age of Onset   Osteoporosis Mother    Irritable bowel syndrome Mother    Hypertension Mother    Hypertension Father    Hyperlipidemia Father    Diabetes Father    Dementia Father    Breast cancer Paternal Aunt     Colon cancer Maternal Grandfather 28   Esophageal cancer Neg Hx    Stomach cancer Neg Hx    Rectal cancer Neg Hx     Review of Systems  All other systems reviewed and are negative.   Exam:   BP 116/62   Pulse 78   Ht 5\' 7"  (1.702 m)   Wt 151 lb (68.5 kg)   LMP 01/25/2011   SpO2 100%   BMI 23.65 kg/m   Weight change: @WEIGHTCHANGE @ Height:   Height: 5\' 7"  (170.2 cm)  Ht Readings from Last 3 Encounters:  11/24/22 5\' 7"  (1.702 m)  06/23/22 5\' 6"  (1.676 m)  06/16/22 5\' 6"  (1.676 m)    General appearance: alert, cooperative and appears stated age Head: Normocephalic, without obvious abnormality, atraumatic Neck: no adenopathy, supple, symmetrical, trachea midline and thyroid normal to inspection and palpation Lungs: clear to auscultation bilaterally Cardiovascular: regular rate and rhythm Breasts: normal appearance, no masses or tenderness, evidence of right lumpectomy Abdomen: soft, non-tender; non distended,  no masses,  no organomegaly Extremities: extremities normal, atraumatic, no cyanosis or edema Skin: Skin color, texture, turgor normal. No rashes or lesions Lymph nodes: Cervical, supraclavicular, and axillary nodes normal. No abnormal inguinal nodes palpated Neurologic: Grossly normal   Pelvic: External genitalia:  no lesions              Urethra:  normal appearing urethra with no masses, tenderness or lesions              Bartholins and Skenes: normal                 Vagina: atrophic, narrow appearing vagina with normal color and discharge, no lesions              Cervix: no lesions               Bimanual Exam:  Uterus:  normal size, contour, position, consistency, mobility, non-tender              Adnexa: no mass, fullness, tenderness               Rectovaginal: Confirms               Anus:  normal sphincter tone, no lesions  Gae Dry, CMA chaperoned for the exam.      1. Well woman exam Discussed breast self exam Discussed calcium and vit D intake Labs  with primary  2. Age-related osteoporosis without current pathological fracture Declines fosamax, interested in reclast. In the fall she had normal lab work. We discussed the risks and side effects of relcast She has an appointment with Rheumatology tomorrow and will discuss this with her as well.  3. Malignant neoplasm of upper-outer quadrant of right breast in  female, estrogen receptor positive Treatment is completed, no longer followed in Oncology  4. Vaginal atrophy Previously helped with vaginal estrogen. She went off of vaginal estrogen when she stopped the tamoxifen. -she has not been able to be sexually active without vaginal estrogen -we discussed the risks and benefits of vaginal estrogen. She understands that a small amount of the estrogen could be absorbed systemically and that that could potentially increase her risk of recurrent breast cancer -She will reach out if she wants to use vaginal estrogen. Would recommend that she start with the 19mcg vaginal tablet   Addendum: On review of her records, she is over due for a HgbA1C, h/o prediabetes. She also will need a renal panel prior to getting reclast.

## 2022-11-19 NOTE — Progress Notes (Signed)
Office Visit Note  Patient: Bonnie HolmsDiane Storti             Date of Birth: March 23, 1961           MRN: [id number]             PCP: Lewis Moccasinewey, Elizabeth R, MD Referring: Romualdo BolkJertson, Jill Evelyn, MD Visit Date: 11/25/2022 Occupation: @GUAROCC @  Subjective:  Knots on hands and feet  History of Present Illness: Bonnie Ward is a 62 y.o. female seen in consultation per request of her GYN Dr. Oscar LaJertson for the evaluation of nodules in her hands.  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  She states for the last 4 years she has been having increased pain and discomfort in her both hands.  She has noticed that some of the fingers have twisted over time.  She also has discomfort in her left first toe.  She has off-and-on discomfort in her bilateral trochanteric region.  She states the pain gets worse when she runs.  She denies any nocturnal pain currently.  Then of the other joints are painful.  There is history of osteoarthritis in her mother.  Gravida 0, para 0.  No history of DVTs.    Activities of Daily Living:  Patient reports morning stiffness for 0 minute.   Patient Denies nocturnal pain.  Difficulty dressing/grooming: Denies Difficulty climbing stairs: Denies Difficulty getting out of chair: Denies Difficulty using hands for taps, buttons, cutlery, and/or writing: Reports  Review of Systems  Constitutional:  Positive for fatigue.  HENT:  Positive for mouth dryness. Negative for mouth sores.   Eyes:  Positive for dryness.  Respiratory:  Negative for shortness of breath.   Cardiovascular:  Negative for chest pain and palpitations.  Gastrointestinal:  Positive for constipation. Negative for blood in stool and diarrhea.  Endocrine: Negative for increased urination.  Genitourinary:  Negative for involuntary urination.  Musculoskeletal:  Positive for joint pain, joint pain, joint swelling and muscle tenderness. Negative for gait problem, myalgias, muscle weakness, morning stiffness and  myalgias.  Skin:  Positive for hair loss and sensitivity to sunlight. Negative for color change and rash.  Allergic/Immunologic: Negative for susceptible to infections.  Neurological:  Positive for dizziness. Negative for headaches.  Hematological:  Negative for swollen glands.  Psychiatric/Behavioral:  Negative for depressed mood and sleep disturbance. The patient is not nervous/anxious.     PMFS History:  Patient Active Problem List   Diagnosis Date Noted   Actinic keratosis 10/22/2021   History of neoplasm 10/22/2021   Chest pain 03/07/2016   CHF NYHA class I 09/10/2015   Chemotherapy adverse reaction 09/01/2015   Hot flashes 06/04/2015   Chemotherapy-induced neuropathy 05/28/2015   Fatigue 05/08/2015   Rash 04/23/2015   Antineoplastic chemotherapy induced anemia 04/23/2015   Insomnia 04/23/2015   Postmenopausal hormone replacement therapy 04/13/2015   Drug rash 04/10/2015   Mucositis due to chemotherapy 03/27/2015   Constipation 03/13/2015   Heartburn 03/13/2015   Malignant neoplasm of upper-outer quadrant of right breast in female, estrogen receptor positive 12/31/2014   Arachnoid cyst    ACID REFLUX DISEASE 07/12/2010   Stress fracture of metatarsal bone 04/22/2010   GLAUCOMA, LEFT EYE 01/13/2009   Allergic rhinitis 01/13/2009    Past Medical History:  Diagnosis Date   ACID REFLUX DISEASE 07/12/2010   Anxiety    Arachnoid cyst    left frontal lobe   Arthritis    hands   Breast cancer 12/2014   right  Complication of anesthesia 2012   states woke up during colonoscopy with Propofol   Dental crowns present    Difficulty swallowing pills    Glaucoma    Hypothyroidism    Migraines    Nervous stomach    Osteoporosis    Personal history of chemotherapy 2016   Personal history of radiation therapy 2016   Rt breast   Seasonal allergies    Stress fracture of foot 12/2014   right    Family History  Problem Relation Age of Onset   Osteoporosis Mother     Irritable bowel syndrome Mother    Hypertension Mother    Hypertension Father    Hyperlipidemia Father    Diabetes Father    Dementia Father    Breast cancer Paternal Aunt    Colon cancer Maternal Grandfather 44   Esophageal cancer Neg Hx    Stomach cancer Neg Hx    Rectal cancer Neg Hx    Past Surgical History:  Procedure Laterality Date   BREAST LUMPECTOMY Right    2016   COLONOSCOPY WITH PROPOFOL  10/14/2013   ESOPHAGOGASTRODUODENOSCOPY ENDOSCOPY     LASIK     PORTACATH PLACEMENT Left 01/23/2015   Procedure: INSERTION PORT-A-CATH ;  Surgeon: Claud Kelp, MD;  Location: [facility];  Service: General;  Laterality: Left;   RADIOACTIVE SEED GUIDED PARTIAL MASTECTOMY WITH AXILLARY SENTINEL LYMPH NODE BIOPSY Right 01/23/2015   Procedure: RIGHT PARTIAL MASTECTOMY WITH RADIOACTIVE SEED LOCALIZATION  WITH RIGHT AXILLARY SENTINEL LYMPH NODE BIOPSY;  Surgeon: Claud Kelp, MD;  Location: [facility];  Service: General;  Laterality: Right;   RE-EXCISION OF BREAST LUMPECTOMY Right 02/16/2015   Procedure: RE-EXCISION OF RIGHT  BREAST LUMPECTOMY MARGINS;  Surgeon: Claud Kelp, MD;  Location: [facility];  Service: General;  Laterality: Right;   TONSILLECTOMY  age 38   TOOTH EXTRACTION     Social History   Social History Narrative   Hairstylist    Hh of 2    Former smoker   Husband smokes outside   Immunization History  Administered Date(s) Administered   Influenza Whole 07/12/2010   Td 07/22/1993, 08/23/2003   Tdap 08/23/2011, 10/22/2021     Objective: Vital Signs: BP 104/70 (BP Location: Right Arm, Patient Position: Sitting, Cuff Size: Normal)   Pulse 74   Resp 14   Ht 5\' 6"  (1.676 m)   Wt 152 lb (68.9 kg)   LMP 01/25/2011   BMI 24.53 kg/m    Physical Exam Vitals and nursing note reviewed.  Constitutional:      Appearance: She is well-developed.  HENT:     Head: Normocephalic and atraumatic.  Eyes:     Conjunctiva/sclera:  Conjunctivae normal.  Cardiovascular:     Rate and Rhythm: Normal rate and regular rhythm.     Heart sounds: Normal heart sounds.  Pulmonary:     Effort: Pulmonary effort is normal.     Breath sounds: Normal breath sounds.  Abdominal:     General: Bowel sounds are normal.     Palpations: Abdomen is soft.  Musculoskeletal:     Cervical back: Normal range of motion.  Lymphadenopathy:     Cervical: No cervical adenopathy.  Skin:    General: Skin is warm and dry.     Capillary Refill: Capillary refill takes less than 2 seconds.  Neurological:     Mental Status: She is alert and oriented to person, place, and time.  Psychiatric:  Behavior: Behavior normal.      Musculoskeletal Exam: Cervical, thoracic and lumbar spine were in good range of motion.  Shoulder joints, elbow joints, wrist joints were in good range of motion.  She had a ganglion cyst over the volar aspect of her left wrist.  Bilateral CMC prominence and subluxation was noted.  There was no synovitis of her wrist joints or MCP joints.  Bilateral PIP and DIP thickening was noted.  Inflammatory changes were noted in the right third and fifth PIP joints.  Subluxation of bilateral first DIP joint was noted.  Hip joints and knee joints were in good range of motion without any warmth swelling or effusion.  There was no tenderness or swelling over ankle joints.  Bilateral PIP and DIP thickening and first MTP thickening was noted.  CDAI Exam: CDAI Score: -- Patient Global: --; Provider Global: -- Swollen: --; Tender: -- Joint Exam 11/25/2022   No joint exam has been documented for this visit   There is currently no information documented on the homunculus. Go to the Rheumatology activity and complete the homunculus joint exam.  Investigation: No additional findings.  Imaging: XR Foot 2 Views Left  Result Date: 11/25/2022 PIP and DIP narrowing was noted.  Most severe narrowing was noted in the first PIP joint.  No MTP,  intertarsal, tibiotalar or subtalar joint space narrowing was noted.  Inferior calcaneal spur was noted. Impression: These findings are consistent with osteoarthritis of the foot.  XR Foot 2 Views Right  Result Date: 11/25/2022 PIP and DIP narrowing was noted.  No MTP, intertarsal, tibiotalar or subtalar joint space narrowing was noted.  No erosive changes were noted. Impression: These findings are consistent with osteoarthritis of the foot.  XR Hand 2 View Left  Result Date: 11/25/2022 CMC, PIP and DIP narrowing was noted.  First PIP spurring and narrowing was noted.  First DIP narrowing and subluxation was noted.  No MCP or intercarpal or radiocarpal joint space narrowing was noted.  No erosive changes were noted. Impression: These findings are consistent with osteoarthritis of the hand.  XR Hand 2 View Right  Result Date: 11/25/2022 CMC, PIP and DIP narrowing was noted.  First PIP and third PIP severe narrowing and spurring was noted.  Severe narrowing of the first DIP joint and subluxation was noted.  No MCP or intercarpal joint space narrowing was noted.  No erosive changes were noted. Impression: These findings are consistent with severe osteoarthritis of the hand.   Recent Labs: Lab Results  Component Value Date   WBC 6.2 05/12/2022   HGB 13.0 05/12/2022   PLT 278 05/12/2022   NA 140 05/12/2022   K 4.8 05/12/2022   CL 101 05/12/2022   CO2 30 05/12/2022   GLUCOSE 90 05/12/2022   BUN 21 05/12/2022   CREATININE 0.98 05/12/2022   BILITOT 0.4 05/12/2022   ALKPHOS 48 03/31/2020   AST 18 05/12/2022   ALT 13 05/12/2022   PROT 6.8 05/12/2022   ALBUMIN 3.9 03/31/2020   CALCIUM 9.1 05/16/2022   GFRAA >60 03/31/2020   May 16, 2022 PTH normal, calcium normal, TSH normal, magnesium normal, phosphorus normal, vitamin D 42  Speciality Comments: No specialty comments available.  Procedures:  No procedures performed Allergies: Lortab [hydrocodone-acetaminophen], Milk-related  compounds, and Coreg [carvedilol]   Assessment / Plan:     Visit Diagnoses: Pain in both hands -complaints of bilateral hand pain for the last 4 years.  She had bilateral PIP and DIP thickening.  She  had inflammatory component in her PIP joints.  Bilateral CMC thickening and subluxation was noted.  Detailed counseling regarding osteoarthritis was provided.  Joint protection muscle strengthening was discussed.  A handout on hand exercises was given.  Use of topical Voltaren gel and arthritis gloves was also discussed.  Plan: XR Hand 2 View Right, XR Hand 2 View Left.  X-rays of the bilateral hands showed CMC, PIP and DIP narrowing.  X-ray findings were reviewed with the patient.  She had inflammatory component to her PIP joints.  I discussed possible use of cortisone injection in the future if the symptoms persist.  Patient will return for follow-up on a as needed basis.  Natural and deprecatory's were discussed.  Ganglion cyst of volar aspect of  left wrist-ganglion cyst was palpable over the volar aspect of her left wrist.  Observation was that suggested.  Pain in both feet -she had bilateral PIP and DIP thickening.  She had bilateral first MTP thickening.  She had prominent thickening of the left first PIP joint.  No synovitis was noted.  Clinical findings are consistent with osteoarthritis.  Proper fitting shoes were advised.  Feet exercises were discussed.  Plan: XR Foot 2 Views Right, XR Foot 2 Views Left.  X-rays were consistent with osteoarthritis.  X-ray findings were reviewed with the patient.  She is most severe narrowing in the left first PIP joint.  Proper fitting shoes were advised.  I advised patient to contact me if her symptoms get worse.  Otherwise she will be returning for follow-up on a as needed basis.  Age-related osteoporosis without current pathological fracture - 04/18/22 The BMD measured at Femur Neck Left is 0.692 g/cm2 with a T-score of -2.5.  According the patient she will be  starting Reclast IV through her GYN.  Stress fracture of metatarsal bone, unspecified laterality, sequela - x4 in the left foot  History of gastroesophageal reflux (GERD)-she takes omeprazole.  Malignant neoplasm of upper-outer quadrant of right breast in female, estrogen receptor positive - 2016, lumpectomyx2, CTX, RTX. Tamoxifen x 5 years.  Chemotherapy-induced neuropathy-she continues to have some paresthesias in her feet.  History of glaucoma  Seasonal allergic rhinitis due to other allergic trigger   Orders: Orders Placed This Encounter  Procedures   XR Hand 2 View Right   XR Hand 2 View Left   XR Foot 2 Views Right   XR Foot 2 Views Left   No orders of the defined types were placed in this encounter.    Follow-Up Instructions: Return if symptoms worsen or fail to improve.   Pollyann Savoy, MD  Note - This record has been created using Animal nutritionist.  Chart creation errors have been sought, but may not always  have been located. Such creation errors do not reflect on  the standard of medical care.

## 2022-11-24 ENCOUNTER — Encounter: Payer: Self-pay | Admitting: Obstetrics and Gynecology

## 2022-11-24 ENCOUNTER — Telehealth: Payer: Self-pay | Admitting: Obstetrics and Gynecology

## 2022-11-24 ENCOUNTER — Ambulatory Visit (INDEPENDENT_AMBULATORY_CARE_PROVIDER_SITE_OTHER): Payer: 59 | Admitting: Obstetrics and Gynecology

## 2022-11-24 VITALS — BP 116/62 | HR 78 | Ht 67.0 in | Wt 151.0 lb

## 2022-11-24 DIAGNOSIS — N952 Postmenopausal atrophic vaginitis: Secondary | ICD-10-CM

## 2022-11-24 DIAGNOSIS — M810 Age-related osteoporosis without current pathological fracture: Secondary | ICD-10-CM

## 2022-11-24 DIAGNOSIS — Z170 Estrogen receptor positive status [ER+]: Secondary | ICD-10-CM | POA: Diagnosis not present

## 2022-11-24 DIAGNOSIS — C50411 Malignant neoplasm of upper-outer quadrant of right female breast: Secondary | ICD-10-CM

## 2022-11-24 DIAGNOSIS — Z0000 Encounter for general adult medical examination without abnormal findings: Secondary | ICD-10-CM

## 2022-11-24 DIAGNOSIS — Z131 Encounter for screening for diabetes mellitus: Secondary | ICD-10-CM

## 2022-11-24 DIAGNOSIS — Z01419 Encounter for gynecological examination (general) (routine) without abnormal findings: Secondary | ICD-10-CM

## 2022-11-24 NOTE — Telephone Encounter (Signed)
Please let the patient know that after she left the office today I further reviewed of her records. She is over due for a HgbA1C, h/o prediabetes. She also will need a renal panel prior to getting reclast.  If she is agreeable please set up a lab appointment and place orders for a HgbA1C and a renal panel. Thank you!

## 2022-11-24 NOTE — Patient Instructions (Signed)

## 2022-11-25 ENCOUNTER — Ambulatory Visit: Payer: 59

## 2022-11-25 ENCOUNTER — Encounter: Payer: Self-pay | Admitting: Rheumatology

## 2022-11-25 ENCOUNTER — Ambulatory Visit (INDEPENDENT_AMBULATORY_CARE_PROVIDER_SITE_OTHER): Payer: 59

## 2022-11-25 ENCOUNTER — Ambulatory Visit: Payer: 59 | Attending: Rheumatology | Admitting: Rheumatology

## 2022-11-25 VITALS — BP 104/70 | HR 74 | Resp 14 | Ht 66.0 in | Wt 152.0 lb

## 2022-11-25 DIAGNOSIS — M67432 Ganglion, left wrist: Secondary | ICD-10-CM | POA: Diagnosis not present

## 2022-11-25 DIAGNOSIS — G620 Drug-induced polyneuropathy: Secondary | ICD-10-CM | POA: Diagnosis not present

## 2022-11-25 DIAGNOSIS — M79671 Pain in right foot: Secondary | ICD-10-CM

## 2022-11-25 DIAGNOSIS — M79672 Pain in left foot: Secondary | ICD-10-CM

## 2022-11-25 DIAGNOSIS — M84376S Stress fracture, unspecified foot, sequela: Secondary | ICD-10-CM

## 2022-11-25 DIAGNOSIS — M810 Age-related osteoporosis without current pathological fracture: Secondary | ICD-10-CM

## 2022-11-25 DIAGNOSIS — T451X5A Adverse effect of antineoplastic and immunosuppressive drugs, initial encounter: Secondary | ICD-10-CM

## 2022-11-25 DIAGNOSIS — Z8719 Personal history of other diseases of the digestive system: Secondary | ICD-10-CM | POA: Diagnosis not present

## 2022-11-25 DIAGNOSIS — Z170 Estrogen receptor positive status [ER+]: Secondary | ICD-10-CM

## 2022-11-25 DIAGNOSIS — Z8669 Personal history of other diseases of the nervous system and sense organs: Secondary | ICD-10-CM | POA: Diagnosis not present

## 2022-11-25 DIAGNOSIS — M79641 Pain in right hand: Secondary | ICD-10-CM

## 2022-11-25 DIAGNOSIS — C50411 Malignant neoplasm of upper-outer quadrant of right female breast: Secondary | ICD-10-CM | POA: Diagnosis not present

## 2022-11-25 DIAGNOSIS — J3089 Other allergic rhinitis: Secondary | ICD-10-CM

## 2022-11-25 DIAGNOSIS — M79642 Pain in left hand: Secondary | ICD-10-CM | POA: Diagnosis not present

## 2022-11-25 DIAGNOSIS — D6481 Anemia due to antineoplastic chemotherapy: Secondary | ICD-10-CM

## 2022-11-25 NOTE — Telephone Encounter (Signed)
Pt agreeable to have labs done. Dr. Oscar La did not indicate whether pt should fast or not. However, since renal panel does include a glucose, will advise pt to fast for labs.   Pt couldn't schedule at time of call but was agreeable to having our appt desk contact her at a later time. Will send msg to appt desk to reach out and will place orders.

## 2022-11-25 NOTE — Patient Instructions (Addendum)
Osteoarthritis  Osteoarthritis is a type of arthritis. It refers to joint pain or joint disease. Osteoarthritis affects tissue that covers the ends of bones in joints (cartilage). Cartilage acts as a cushion between the bones and helps them move smoothly. Osteoarthritis occurs when cartilage in the joints gets worn down. Osteoarthritis is sometimes called "wear and tear" arthritis. Osteoarthritis is the most common form of arthritis. It often occurs in older people. It is a condition that gets worse over time. The joints most often affected by this condition are in the fingers, toes, hips, knees, and spine, including the neck and lower back. What are the causes? This condition is caused by the wearing down of cartilage that covers the ends of bones. What increases the risk? The following factors may make you more likely to develop this condition: Being age 50 or older. Obesity. Overuse of joints. Past injury of a joint. Past surgery on a joint. Family history of osteoarthritis. What are the signs or symptoms? The main symptoms of this condition are pain, swelling, and stiffness in the joint. Other symptoms may include: An enlarged joint. More pain and further damage caused by small pieces of bone or cartilage that break off and float inside of the joint. Small deposits of bone (osteophytes) that grow on the edges of the joint. A grating or scraping feeling inside the joint when you move it. Popping or creaking sounds when you move. Difficulty walking or exercising. An inability to grip items, twist your hand, or control the movements of your hands and fingers. How is this diagnosed? This condition may be diagnosed based on: Your medical history. A physical exam. Your symptoms. X-rays of the affected joints. Blood tests to rule out other types of arthritis. How is this treated? There is no cure for this condition, but treatment can help control pain and improve joint function. Treatment  may include a combination of therapies, such as: Pain relief techniques, such as: Applying heat and cold to the joint. Massage. A form of talk therapy called cognitive behavioral therapy (CBT). This therapy helps you set goals and follow up on the changes that you make. Medicines for pain and inflammation. The medicines can be taken by mouth or applied to the skin. They include: NSAIDs, such as ibuprofen. Prescription medicines. Strong anti-inflammatory medicines (corticosteroids). Certain nutritional supplements. A prescribed exercise program. You may work with a physical therapist. Assistive devices, such as a brace, wrap, splint, specialized glove, or cane. A weight control plan. Surgery, such as: An osteotomy. This is done to reposition the bones and relieve pain or to remove loose pieces of bone and cartilage. Joint replacement surgery. You may need this surgery if you have advanced osteoarthritis. Follow these instructions at home: Activity Rest your affected joints as told by your health care provider. Exercise as told by your provider. The provider may recommend specific types of exercise, such as: Strengthening exercises. These are done to strengthen the muscles that support joints affected by arthritis. Aerobic activities. These are exercises, such as brisk walking or water aerobics, that increase your heart rate. Range-of-motion activities. These help your joints move more easily. Balance and agility exercises. Managing pain, stiffness, and swelling     If told, apply heat to the affected area as often as told by your provider. Use the heat source that your provider recommends, such as a moist heat pack or a heating pad. If you have a removable assistive device, remove it as told by your provider. Place a   towel between your skin and the heat source. If your provider tells you to keep the assistive device on while you apply heat, place a towel between the assistive device and  the heat source. Leave the heat on for 20-30 minutes. If told, put ice on the affected area. If you have a removable assistive device, remove it as told by your provider. Put ice in a plastic bag. Place a towel between your skin and the bag. If your provider tells you to keep the assistive device on during icing, place a towel between the assistive device and the bag. Leave the ice on for 20 minutes, 2-3 times a day. If your skin turns bright red, remove the ice or heat right away to prevent skin damage. The risk of damage is higher if you cannot feel pain, heat, or cold. Move your fingers or toes often to reduce stiffness and swelling. Raise (elevate) the affected area above the level of your heart while you are sitting or lying down. General instructions Take over-the-counter and prescription medicines only as told by your provider. Maintain a healthy weight. Follow instructions from your provider for weight control. Do not use any products that contain nicotine or tobacco. These products include cigarettes, chewing tobacco, and vaping devices, such as e-cigarettes. If you need help quitting, ask your provider. Use assistive devices as told by your provider. Where to find more information National Institute of Arthritis and Musculoskeletal and Skin Diseases: niams.nih.gov National Institute on Aging: nia.nih.gov American College of Rheumatology: rheumatology.org Contact a health care provider if: You have redness, swelling, or a feeling of warmth in a joint that gets worse. You have a fever along with joint or muscle aches. You develop a rash. You have trouble doing your normal activities. You have pain that gets worse and is not relieved by pain medicine. This information is not intended to replace advice given to you by your health care provider. Make sure you discuss any questions you have with your health care provider. Document Revised: 04/07/2022 Document Reviewed:  04/07/2022 Elsevier Patient Education  2023 [employer] Hand Exercises Hand exercises can be helpful for almost anyone. These exercises can strengthen the hands, improve flexibility and movement, and increase blood flow to the hands. These results can make work and daily tasks easier. Hand exercises can be especially helpful for people who have joint pain from arthritis or have nerve damage from overuse (carpal tunnel syndrome). These exercises can also help people who have injured a hand. Exercises Most of these hand exercises are gentle stretching and motion exercises. It is usually safe to do them often throughout the day. Warming up your hands before exercise may help to reduce stiffness. You can do this with gentle massage or by placing your hands in warm water for 10-15 minutes. It is normal to feel some stretching, pulling, tightness, or mild discomfort as you begin new exercises. This will gradually improve. Stop an exercise right away if you feel sudden, severe pain or your pain gets worse. Ask your health care provider which exercises are best for you. Knuckle bend or "claw" fist  Stand or sit with your arm, hand, and all five fingers pointed straight up. Make sure to keep your wrist straight during the exercise. Gently bend your fingers down toward your palm until the tips of your fingers are touching the top of your palm. Keep your big knuckle straight and just bend the small knuckles in your fingers. Hold this position for __________ seconds.   Straighten (extend) your fingers back to the starting position. Repeat this exercise 5-10 times with each hand. Full finger fist  Stand or sit with your arm, hand, and all five fingers pointed straight up. Make sure to keep your wrist straight during the exercise. Gently bend your fingers into your palm until the tips of your fingers are touching the middle of your palm. Hold this position for __________ seconds. Extend your fingers back to  the starting position, stretching every joint fully. Repeat this exercise 5-10 times with each hand. Straight fist Stand or sit with your arm, hand, and all five fingers pointed straight up. Make sure to keep your wrist straight during the exercise. Gently bend your fingers at the big knuckle, where your fingers meet your hand, and the middle knuckle. Keep the knuckle at the tips of your fingers straight and try to touch the bottom of your palm. Hold this position for __________ seconds. Extend your fingers back to the starting position, stretching every joint fully. Repeat this exercise 5-10 times with each hand. Tabletop  Stand or sit with your arm, hand, and all five fingers pointed straight up. Make sure to keep your wrist straight during the exercise. Gently bend your fingers at the big knuckle, where your fingers meet your hand, as far down as you can while keeping the small knuckles in your fingers straight. Think of forming a tabletop with your fingers. Hold this position for __________ seconds. Extend your fingers back to the starting position, stretching every joint fully. Repeat this exercise 5-10 times with each hand. Finger spread  Place your hand flat on a table with your palm facing down. Make sure your wrist stays straight as you do this exercise. Spread your fingers and thumb apart from each other as far as you can until you feel a gentle stretch. Hold this position for __________ seconds. Bring your fingers and thumb tight together again. Hold this position for __________ seconds. Repeat this exercise 5-10 times with each hand. Making circles  Stand or sit with your arm, hand, and all five fingers pointed straight up. Make sure to keep your wrist straight during the exercise. Make a circle by touching the tip of your thumb to the tip of your index finger. Hold for __________ seconds. Then open your hand wide. Repeat this motion with your thumb and each finger on your  hand. Repeat this exercise 5-10 times with each hand. Thumb motion  Sit with your forearm resting on a table and your wrist straight. Your thumb should be facing up toward the ceiling. Keep your fingers relaxed as you move your thumb. Lift your thumb up as high as you can toward the ceiling. Hold for __________ seconds. Bend your thumb across your palm as far as you can, reaching the tip of your thumb for the small finger (pinkie) side of your palm. Hold for __________ seconds. Repeat this exercise 5-10 times with each hand. Grip strengthening  Hold a stress ball or other soft ball in the middle of your hand. Slowly increase the pressure, squeezing the ball as much as you can without causing pain. Think of bringing the tips of your fingers into the middle of your palm. All of your finger joints should bend when doing this exercise. Hold your squeeze for __________ seconds, then relax. Repeat this exercise 5-10 times with each hand. Contact a health care provider if: Your hand pain or discomfort gets much worse when you do an exercise. Your hand pain   or discomfort does not improve within 2 hours after you exercise. If you have any of these problems, stop doing these exercises right away. Do not do them again unless your health care provider says that you can. Get help right away if: You develop sudden, severe hand pain or swelling. If this happens, stop doing these exercises right away. Do not do them again unless your health care provider says that you can. This information is not intended to replace advice given to you by your health care provider. Make sure you discuss any questions you have with your health care provider. Document Revised: 11/19/2020 Document Reviewed: 11/26/2020 Elsevier Patient Education  2023 Elsevier Inc.  

## 2022-11-29 NOTE — Telephone Encounter (Signed)
FYI. Pt scheduled for 12/05/2022. Will route to provider for final review and close encounter.

## 2022-12-05 ENCOUNTER — Other Ambulatory Visit: Payer: 59

## 2022-12-05 DIAGNOSIS — Z131 Encounter for screening for diabetes mellitus: Secondary | ICD-10-CM

## 2022-12-05 DIAGNOSIS — Z0000 Encounter for general adult medical examination without abnormal findings: Secondary | ICD-10-CM

## 2022-12-06 LAB — COMPLETE METABOLIC PANEL WITH GFR
AG Ratio: 1.9 (calc) (ref 1.0–2.5)
ALT: 12 U/L (ref 6–29)
AST: 16 U/L (ref 10–35)
Albumin: 4.3 g/dL (ref 3.6–5.1)
Alkaline phosphatase (APISO): 83 U/L (ref 37–153)
BUN: 13 mg/dL (ref 7–25)
CO2: 28 mmol/L (ref 20–32)
Calcium: 9.3 mg/dL (ref 8.6–10.4)
Chloride: 103 mmol/L (ref 98–110)
Creat: 0.89 mg/dL (ref 0.50–1.05)
Globulin: 2.3 g/dL (calc) (ref 1.9–3.7)
Glucose, Bld: 100 mg/dL — ABNORMAL HIGH (ref 65–99)
Potassium: 5.5 mmol/L — ABNORMAL HIGH (ref 3.5–5.3)
Sodium: 139 mmol/L (ref 135–146)
Total Bilirubin: 0.6 mg/dL (ref 0.2–1.2)
Total Protein: 6.6 g/dL (ref 6.1–8.1)
eGFR: 74 mL/min/{1.73_m2} (ref >=60)

## 2022-12-06 LAB — HEMOGLOBIN A1C W/OUT EAG: Hgb A1c MFr Bld: 5.9 % of total Hgb — ABNORMAL HIGH (ref <5.7)

## 2022-12-09 ENCOUNTER — Other Ambulatory Visit: Payer: Self-pay

## 2022-12-09 DIAGNOSIS — E875 Hyperkalemia: Secondary | ICD-10-CM

## 2022-12-12 ENCOUNTER — Other Ambulatory Visit: Payer: 59

## 2022-12-13 ENCOUNTER — Other Ambulatory Visit: Payer: 59

## 2022-12-19 ENCOUNTER — Ambulatory Visit: Payer: 59 | Admitting: Rheumatology

## 2022-12-26 DIAGNOSIS — Z0000 Encounter for general adult medical examination without abnormal findings: Secondary | ICD-10-CM | POA: Diagnosis not present

## 2022-12-26 DIAGNOSIS — Z13220 Encounter for screening for lipoid disorders: Secondary | ICD-10-CM | POA: Diagnosis not present

## 2022-12-26 DIAGNOSIS — Z114 Encounter for screening for human immunodeficiency virus [HIV]: Secondary | ICD-10-CM | POA: Diagnosis not present

## 2023-01-02 DIAGNOSIS — Z0000 Encounter for general adult medical examination without abnormal findings: Secondary | ICD-10-CM | POA: Diagnosis not present

## 2023-03-22 ENCOUNTER — Encounter (HOSPITAL_BASED_OUTPATIENT_CLINIC_OR_DEPARTMENT_OTHER): Payer: Self-pay

## 2023-03-22 ENCOUNTER — Other Ambulatory Visit: Payer: Self-pay

## 2023-03-22 ENCOUNTER — Emergency Department (HOSPITAL_BASED_OUTPATIENT_CLINIC_OR_DEPARTMENT_OTHER)
Admission: EM | Admit: 2023-03-22 | Discharge: 2023-03-23 | Disposition: A | Discharge status: Home / Self Care | Payer: 59 | Attending: Emergency Medicine | Admitting: Emergency Medicine

## 2023-03-22 DIAGNOSIS — S91012A Laceration without foreign body, left ankle, initial encounter: Secondary | ICD-10-CM | POA: Diagnosis not present

## 2023-03-22 DIAGNOSIS — S99912A Unspecified injury of left ankle, initial encounter: Secondary | ICD-10-CM | POA: Diagnosis not present

## 2023-03-22 DIAGNOSIS — Z23 Encounter for immunization: Secondary | ICD-10-CM | POA: Insufficient documentation

## 2023-03-22 DIAGNOSIS — W268XXA Contact with other sharp object(s), not elsewhere classified, initial encounter: Secondary | ICD-10-CM | POA: Diagnosis not present

## 2023-03-22 MED ORDER — TETANUS-DIPHTH-ACELL PERTUSSIS 5-2.5-18.5 LF-MCG/0.5 IM SUSY
0.5000 mL | PREFILLED_SYRINGE | Freq: Once | INTRAMUSCULAR | Status: AC
  Administered 2023-03-22: 0.5 mL via INTRAMUSCULAR
  Filled 2023-03-22: qty 0.5

## 2023-03-22 MED ORDER — LIDOCAINE-EPINEPHRINE (PF) 2 %-1:200000 IJ SOLN
10.0000 mL | Freq: Once | INTRAMUSCULAR | Status: AC
  Administered 2023-03-23: 10 mL via INTRADERMAL
  Filled 2023-03-22: qty 20

## 2023-03-22 NOTE — ED Triage Notes (Signed)
POV from home, A&O x 4, GCS 15, amb to triage  Left ankle laceration from storm door, needs TDAP. Bleeding controlled.

## 2023-03-23 NOTE — ED Notes (Signed)
Reviewed AVS/discharge instruction with patient. Time allotted for and all questions answered. Patient is agreeable for d/c and escorted to ed exit by staff.  

## 2023-03-23 NOTE — Discharge Instructions (Addendum)
You had 4 sutures placed in your ankle today. Wash gently with warm water and soap as needed. You may place antibiotic ointment over the laceration and wrap with gauze if desired. Do not submerge your foot in water until fully healed, but your may shower.  Return to your PCP or urgent care to have these removed in 10-14 days.  Your tetanus has been updated today.  Return to the ER should you develop fever, chills, pus drainage from your wound, redness around your wound.

## 2023-03-23 NOTE — ED Provider Notes (Signed)
  [place] [facility]PARTMENT AT [facility] Provider Note   CSN: [id number] Arrival date & time: 03/22/23  2129     History {Add pertinent medical, surgical, social history, OB history to HPI:1} Chief Complaint  Patient presents with   Laceration    Left ankle    Bonnie Ward is a 62 y.o. female.  4 5-0 prolene in left ankle   Laceration      Home Medications Prior to Admission medications   Medication Sig Start Date End Date Taking? Authorizing Provider  gabapentin (NEURONTIN) 100 MG capsule Take 100 mg by mouth at bedtime.    [provider]  latanoprost (XALATAN) 0.005 % ophthalmic solution 1 drop at bedtime.    [provider]  Omega-3 Fatty Acids (FISH OIL) 1000 MG CPDR Take 1 capsule by mouth daily.    [provider]  omeprazole (PRILOSEC) 20 MG capsule Take 20 mg by mouth daily.    [provider]  sertraline (ZOLOFT) 25 MG tablet Take 12.5 mg by mouth daily.    [provider]  TURMERIC PO Take by mouth.    [provider]      Allergies    Lortab [hydrocodone-acetaminophen], Milk-related compounds, and Coreg [carvedilol]    Review of Systems   Review of Systems  Physical Exam Updated Vital Signs BP 137/84 (BP Location: Left Arm)   Pulse 85   Temp 98.4 F (36.9 C)   Resp 18   Ht 5\' 6"  (1.676 m)   Wt 63 kg   LMP 01/25/2011   SpO2 98%   BMI 22.44 kg/m  Physical Exam  ED Results / Procedures / Treatments   Labs (all labs ordered are listed, but only abnormal results are displayed) Labs Reviewed - No data to display  EKG None  Radiology No results found.  Procedures Procedures  {Document cardiac monitor, telemetry assessment procedure when appropriate:1}  Medications Ordered in ED Medications  lidocaine-EPINEPHrine (XYLOCAINE W/EPI) 2 %-1:200000 (PF) injection 10 mL (has no administration in time range)  Tdap (BOOSTRIX) injection 0.5 mL (0.5 mLs Intramuscular Given  03/22/23 2314)    ED Course/ Medical Decision Making/ A&P   {   Click here for ABCD2, HEART and other calculatorsREFRESH Note before signing :1}                              Medical Decision Making Risk Prescription drug management.   ***  {Document critical care time when appropriate:1} {Document review of labs and clinical decision tools ie heart score, Chads2Vasc2 etc:1}  {Document your independent review of radiology images, and any outside records:1} {Document your discussion with family members, caretakers, and with consultants:1} {Document social determinants of health affecting pt's care:1} {Document your decision making why or why not admission, treatments were needed:1} Final Clinical Impression(s) / ED Diagnoses Final diagnoses:  Laceration of ankle with complication, left, initial encounter    Rx / DC Orders ED Discharge Orders     None

## 2023-04-06 DIAGNOSIS — M810 Age-related osteoporosis without current pathological fracture: Secondary | ICD-10-CM | POA: Diagnosis not present

## 2023-04-06 DIAGNOSIS — R7303 Prediabetes: Secondary | ICD-10-CM | POA: Diagnosis not present

## 2023-04-06 DIAGNOSIS — E782 Mixed hyperlipidemia: Secondary | ICD-10-CM | POA: Diagnosis not present

## 2023-04-10 DIAGNOSIS — Z789 Other specified health status: Secondary | ICD-10-CM | POA: Diagnosis not present

## 2023-04-10 DIAGNOSIS — E782 Mixed hyperlipidemia: Secondary | ICD-10-CM | POA: Diagnosis not present

## 2023-04-10 DIAGNOSIS — G4700 Insomnia, unspecified: Secondary | ICD-10-CM | POA: Diagnosis not present

## 2023-04-10 DIAGNOSIS — F411 Generalized anxiety disorder: Secondary | ICD-10-CM | POA: Diagnosis not present

## 2023-04-10 DIAGNOSIS — K219 Gastro-esophageal reflux disease without esophagitis: Secondary | ICD-10-CM | POA: Diagnosis not present

## 2023-04-10 DIAGNOSIS — F331 Major depressive disorder, recurrent, moderate: Secondary | ICD-10-CM | POA: Diagnosis not present

## 2023-04-10 DIAGNOSIS — R7303 Prediabetes: Secondary | ICD-10-CM | POA: Diagnosis not present

## 2023-04-10 DIAGNOSIS — Z7184 Encounter for health counseling related to travel: Secondary | ICD-10-CM | POA: Diagnosis not present

## 2023-06-26 DIAGNOSIS — H401134 Primary open-angle glaucoma, bilateral, indeterminate stage: Secondary | ICD-10-CM | POA: Diagnosis not present

## 2023-10-30 DIAGNOSIS — H401223 Low-tension glaucoma, left eye, severe stage: Secondary | ICD-10-CM | POA: Diagnosis not present

## 2023-10-30 DIAGNOSIS — H401211 Low-tension glaucoma, right eye, mild stage: Secondary | ICD-10-CM | POA: Diagnosis not present

## 2023-11-16 DIAGNOSIS — E559 Vitamin D deficiency, unspecified: Secondary | ICD-10-CM | POA: Diagnosis not present

## 2023-11-16 DIAGNOSIS — R5383 Other fatigue: Secondary | ICD-10-CM | POA: Diagnosis not present

## 2023-11-22 DIAGNOSIS — Z0000 Encounter for general adult medical examination without abnormal findings: Secondary | ICD-10-CM | POA: Diagnosis not present

## 2023-11-22 DIAGNOSIS — Z13220 Encounter for screening for lipoid disorders: Secondary | ICD-10-CM | POA: Diagnosis not present

## 2023-12-01 ENCOUNTER — Other Ambulatory Visit: Payer: Self-pay | Admitting: Radiology

## 2023-12-01 ENCOUNTER — Encounter: Payer: Self-pay | Admitting: Radiology

## 2023-12-01 ENCOUNTER — Ambulatory Visit (INDEPENDENT_AMBULATORY_CARE_PROVIDER_SITE_OTHER): Payer: 59 | Admitting: Radiology

## 2023-12-01 VITALS — BP 112/64 | HR 83 | Ht 65.5 in | Wt 152.0 lb

## 2023-12-01 DIAGNOSIS — Z1331 Encounter for screening for depression: Secondary | ICD-10-CM

## 2023-12-01 DIAGNOSIS — Z01419 Encounter for gynecological examination (general) (routine) without abnormal findings: Secondary | ICD-10-CM

## 2023-12-01 DIAGNOSIS — C50411 Malignant neoplasm of upper-outer quadrant of right female breast: Secondary | ICD-10-CM

## 2023-12-01 DIAGNOSIS — Z1231 Encounter for screening mammogram for malignant neoplasm of breast: Secondary | ICD-10-CM

## 2023-12-01 DIAGNOSIS — M810 Age-related osteoporosis without current pathological fracture: Secondary | ICD-10-CM

## 2023-12-01 DIAGNOSIS — Z170 Estrogen receptor positive status [ER+]: Secondary | ICD-10-CM | POA: Diagnosis not present

## 2023-12-01 NOTE — Patient Instructions (Signed)
 Preventive Care 16-63 Years Old, Female  Preventive care refers to lifestyle choices and visits with your health care provider that can promote health and wellness. Preventive care visits are also called wellness exams.  What can I expect for my preventive care visit?  Counseling  Your health care provider may ask you questions about your:  Medical history, including:  Past medical problems.  Family medical history.  Pregnancy history.  Current health, including:  Menstrual cycle.  Method of birth control.  Emotional well-being.  Home life and relationship well-being.  Sexual activity and sexual health.  Lifestyle, including:  Alcohol, nicotine or tobacco, and drug use.  Access to firearms.  Diet, exercise, and sleep habits.  Work and work Astronomer.  Sunscreen use.  Safety issues such as seatbelt and bike helmet use.  Physical exam  Your health care provider will check your:  Height and weight. These may be used to calculate your BMI (body mass index). BMI is a measurement that tells if you are at a healthy weight.  Waist circumference. This measures the distance around your waistline. This measurement also tells if you are at a healthy weight and may help predict your risk of certain diseases, such as type 2 diabetes and high blood pressure.  Heart rate and blood pressure.  Body temperature.  Skin for abnormal spots.  What immunizations do I need?    Vaccines are usually given at various ages, according to a schedule. Your health care provider will recommend vaccines for you based on your age, medical history, and lifestyle or other factors, such as travel or where you work.  What tests do I need?  Screening  Your health care provider may recommend screening tests for certain conditions. This may include:  Lipid and cholesterol levels.  Diabetes screening. This is done by checking your blood sugar (glucose) after you have not eaten for a while (fasting).  Pelvic exam and Pap test.  Hepatitis B test.  Hepatitis C  test.  HIV (human immunodeficiency virus) test.  STI (sexually transmitted infection) testing, if you are at risk.  Lung cancer screening.  Colorectal cancer screening.  Mammogram. Talk with your health care provider about when you should start having regular mammograms. This may depend on whether you have a family history of breast cancer.  BRCA-related cancer screening. This may be done if you have a family history of breast, ovarian, tubal, or peritoneal cancers.  Bone density scan. This is done to screen for osteoporosis.  Talk with your health care provider about your test results, treatment options, and if necessary, the need for more tests.  Follow these instructions at home:  Eating and drinking    Eat a diet that includes fresh fruits and vegetables, whole grains, lean protein, and low-fat dairy products.  Take vitamin and mineral supplements as recommended by your health care provider.  Do not drink alcohol if:  Your health care provider tells you not to drink.  You are pregnant, may be pregnant, or are planning to become pregnant.  If you drink alcohol:  Limit how much you have to 0-1 drink a day.  Know how much alcohol is in your drink. In the [place] one drink equals one 12 oz bottle of beer (355 mL), one 5 oz glass of wine (148 mL), or one 1 oz glass of hard liquor (44 mL).  Lifestyle  Brush your teeth every morning and night with fluoride toothpaste. Floss one time each day.  Exercise for at least  30 minutes 5 or more days each week.  Do not use any products that contain nicotine or tobacco. These products include cigarettes, chewing tobacco, and vaping devices, such as e-cigarettes. If you need help quitting, ask your health care provider.  Do not use drugs.  If you are sexually active, practice safe sex. Use a condom or other form of protection to prevent STIs.  If you do not wish to become pregnant, use a form of birth control. If you plan to become pregnant, see your health care provider for a  prepregnancy visit.  Take aspirin only as told by your health care provider. Make sure that you understand how much to take and what form to take. Work with your health care provider to find out whether it is safe and beneficial for you to take aspirin daily.  Find healthy ways to manage stress, such as:  Meditation, yoga, or listening to music.  Journaling.  Talking to a trusted person.  Spending time with friends and family.  Minimize exposure to UV radiation to reduce your risk of skin cancer.  Safety  Always wear your seat belt while driving or riding in a vehicle.  Do not drive:  If you have been drinking alcohol. Do not ride with someone who has been drinking.  When you are tired or distracted.  While texting.  If you have been using any mind-altering substances or drugs.  Wear a helmet and other protective equipment during sports activities.  If you have firearms in your house, make sure you follow all gun safety procedures.  Seek help if you have been physically or sexually abused.  What's next?  Visit your health care provider once a year for an annual wellness visit.  Ask your health care provider how often you should have your eyes and teeth checked.  Stay up to date on all vaccines.  This information is not intended to replace advice given to you by your health care provider. Make sure you discuss any questions you have with your health care provider.  Document Revised: 02/03/2021 Document Reviewed: 02/03/2021  Elsevier Patient Education  2024 [employer].

## 2023-12-01 NOTE — Progress Notes (Signed)
   Bonnie Ward 1961/03/02 [id number]   History: Postmenopausal 63 y.o. presents for annual exam.h/o estrogen/progesterone receptor positive breast cancer, diagnosed in 2016. S/p lumpectomy, SLN biopsy. S/P chemo, radiation, and tamoxifen. C/o worsening memory loss, has seen PCP, labs pending.    Gynecologic History Postmenopausal Last Pap: 2023. Results were: normal, HPV neg Last mammogram: 12/2021. Results were: normal Last colonoscopy: 04/18/22 osteoporosis T score -2.5  DEXA:2023   Obstetric History OB History  Gravida Para Term Preterm AB Living  0 0 0 0 0 0  SAB IAB Ectopic Multiple Live Births  0 0 0 0 0       12/01/2023   10:04 AM 08/18/2015    3:55 PM 08/04/2015    8:37 AM  Depression screen PHQ 2/9  Decreased Interest 0 0 0  Down, Depressed, Hopeless 1 0 0  PHQ - 2 Score 1 0 0     The following portions of the patient's history were reviewed and updated as appropriate: allergies, current medications, past family history, past medical history, past social history, past surgical history, and problem list.  Review of Systems Pertinent items noted in HPI and remainder of comprehensive ROS otherwise negative.  Past medical history, past surgical history, family history and social history were all reviewed and documented in the EPIC chart.  Exam:  Vitals:   12/01/23 1003  BP: 112/64  Pulse: 83  SpO2: 96%  Weight: 152 lb (68.9 kg)  Height: 5' 5.5" (1.664 m)   Body mass index is 24.91 kg/m.  General appearance:  Normal Thyroid:  Symmetrical, normal in size, without palpable masses or nodularity. Respiratory  Auscultation:  Clear without wheezing or rhonchi Cardiovascular  Auscultation:  Regular rate, without rubs, murmurs or gallops  Edema/varicosities:  Not grossly evident Abdominal  Soft,nontender, without masses, guarding or rebound.  Liver/spleen:  No organomegaly noted  Hernia:  None appreciated  Skin  Inspection:  Grossly normal Breasts: Examined  lying and sitting.   Right: Without masses, retractions, nipple discharge or axillary adenopathy.   Left: Without masses, retractions, nipple discharge or axillary adenopathy. Genitourinary   Inguinal/mons:  Normal without inguinal adenopathy  External genitalia:  Normal appearing vulva with no masses, tenderness, or lesions  BUS/Urethra/Skene's glands:  Normal  Vagina:  Normal appearing with normal color and discharge, no lesions. Atrophy: moderate   Cervix:  Normal appearing without discharge or lesions  Uterus:  Normal in size, shape and contour.  Midline and mobile, nontender  Adnexa/parametria:     Rt: Normal in size, without masses or tenderness.   Lt: Normal in size, without masses or tenderness.  Anus and perineum: Normal    Raynelle Fanning, CMA present for exam  Assessment/Plan:   1. Well woman exam with routine gynecological exam (Primary) Pap 2028  2. Age-related osteoporosis without current pathological fracture - DG Bone Density; Future - Did not schedule reclast, interested in Prolia, will precert  3. Malignant neoplasm of upper-outer quadrant of right breast in female, estrogen receptor positive (HCC) No longer followed by oncology Overdue for mammogram, will schedule   Discussed SBE, colonoscopy and DEXA screening as directed. Recommend of exercise weekly, including weight bearing exercise. Encouraged the use of seatbelts and sunscreen.  Return in 1 year for annual or sooner prn.  Tanda Rockers WHNP-BC, 10:25 AM 12/01/2023

## 2023-12-04 DIAGNOSIS — Z0000 Encounter for general adult medical examination without abnormal findings: Secondary | ICD-10-CM | POA: Diagnosis not present

## 2023-12-12 ENCOUNTER — Telehealth: Payer: Self-pay | Admitting: *Deleted

## 2023-12-12 NOTE — Telephone Encounter (Signed)
-----   Message from CHRZANOWSKI, [place] B sent at 12/01/2023 10:30 AM EDT ----- Regarding: Prolia  T score -2.5 2023, was planning reclast and changed her mind. Would like to try Prolia . Please precert. Planning for repeat DEXA 03/2024

## 2023-12-12 NOTE — Telephone Encounter (Signed)
Insurance information submitted to [employer] portal. Will await summary of benefits for prolia.    

## 2023-12-18 ENCOUNTER — Ambulatory Visit
Admission: RE | Admit: 2023-12-18 | Discharge: 2023-12-18 | Disposition: A | Discharge status: Home / Self Care | Source: Ambulatory Visit | Attending: Radiology | Admitting: Radiology

## 2023-12-18 DIAGNOSIS — Z1231 Encounter for screening mammogram for malignant neoplasm of breast: Secondary | ICD-10-CM

## 2023-12-19 NOTE — Telephone Encounter (Signed)
 PA for prolia  submitted to [employer] via [employer] portal. Authorization is pending. Authorization Number: [id number]. Will await response.

## 2023-12-20 DIAGNOSIS — F331 Major depressive disorder, recurrent, moderate: Secondary | ICD-10-CM | POA: Diagnosis not present

## 2023-12-20 DIAGNOSIS — F411 Generalized anxiety disorder: Secondary | ICD-10-CM | POA: Diagnosis not present

## 2023-12-20 DIAGNOSIS — T451X5A Adverse effect of antineoplastic and immunosuppressive drugs, initial encounter: Secondary | ICD-10-CM | POA: Diagnosis not present

## 2023-12-20 DIAGNOSIS — G620 Drug-induced polyneuropathy: Secondary | ICD-10-CM | POA: Diagnosis not present

## 2023-12-20 DIAGNOSIS — G4700 Insomnia, unspecified: Secondary | ICD-10-CM | POA: Diagnosis not present

## 2023-12-20 DIAGNOSIS — K5900 Constipation, unspecified: Secondary | ICD-10-CM | POA: Diagnosis not present

## 2023-12-27 ENCOUNTER — Other Ambulatory Visit: Payer: Self-pay | Admitting: *Deleted

## 2023-12-27 DIAGNOSIS — M810 Age-related osteoporosis without current pathological fracture: Secondary | ICD-10-CM

## 2023-12-27 MED ORDER — DENOSUMAB 60 MG/ML ~~LOC~~ SOSY: 60.0000 mg | PREFILLED_SYRINGE | SUBCUTANEOUS | Status: AC

## 2024-02-01 DIAGNOSIS — L814 Other melanin hyperpigmentation: Secondary | ICD-10-CM | POA: Diagnosis not present

## 2024-02-01 DIAGNOSIS — D225 Melanocytic nevi of trunk: Secondary | ICD-10-CM | POA: Diagnosis not present

## 2024-02-01 DIAGNOSIS — L570 Actinic keratosis: Secondary | ICD-10-CM | POA: Diagnosis not present

## 2024-02-01 DIAGNOSIS — L821 Other seborrheic keratosis: Secondary | ICD-10-CM | POA: Diagnosis not present

## 2024-02-05 DIAGNOSIS — T451X5A Adverse effect of antineoplastic and immunosuppressive drugs, initial encounter: Secondary | ICD-10-CM | POA: Diagnosis not present

## 2024-02-05 DIAGNOSIS — F411 Generalized anxiety disorder: Secondary | ICD-10-CM | POA: Diagnosis not present

## 2024-02-05 DIAGNOSIS — G4700 Insomnia, unspecified: Secondary | ICD-10-CM | POA: Diagnosis not present

## 2024-02-05 DIAGNOSIS — F331 Major depressive disorder, recurrent, moderate: Secondary | ICD-10-CM | POA: Diagnosis not present

## 2024-02-05 DIAGNOSIS — G620 Drug-induced polyneuropathy: Secondary | ICD-10-CM | POA: Diagnosis not present

## 2024-02-13 NOTE — Telephone Encounter (Signed)
 See Prolia  referral dated 12/27/23.   Encounter closed.

## 2024-03-15 ENCOUNTER — Telehealth: Payer: Self-pay | Admitting: *Deleted

## 2024-03-15 NOTE — Telephone Encounter (Signed)
 See prolia  referral dated 12/27/23. Spoke with patient today and she declines to proceed with scheduling prolia . States she is taking calcium, vitamin d  and exercising. RN advised would update provider. Patient agreeable.   Routing to provider as RICK.

## 2024-03-18 DIAGNOSIS — G4700 Insomnia, unspecified: Secondary | ICD-10-CM | POA: Diagnosis not present

## 2024-03-18 DIAGNOSIS — F331 Major depressive disorder, recurrent, moderate: Secondary | ICD-10-CM | POA: Diagnosis not present

## 2024-03-18 DIAGNOSIS — F411 Generalized anxiety disorder: Secondary | ICD-10-CM | POA: Diagnosis not present

## 2024-03-25 DIAGNOSIS — N3000 Acute cystitis without hematuria: Secondary | ICD-10-CM | POA: Diagnosis not present

## 2024-03-25 DIAGNOSIS — R8290 Unspecified abnormal findings in urine: Secondary | ICD-10-CM | POA: Diagnosis not present

## 2024-04-01 DIAGNOSIS — H401223 Low-tension glaucoma, left eye, severe stage: Secondary | ICD-10-CM | POA: Diagnosis not present

## 2024-04-01 DIAGNOSIS — H401211 Low-tension glaucoma, right eye, mild stage: Secondary | ICD-10-CM | POA: Diagnosis not present

## 2024-04-07 DIAGNOSIS — L03116 Cellulitis of left lower limb: Secondary | ICD-10-CM | POA: Diagnosis not present

## 2024-04-08 DIAGNOSIS — R600 Localized edema: Secondary | ICD-10-CM | POA: Diagnosis not present

## 2024-04-08 DIAGNOSIS — M79605 Pain in left leg: Secondary | ICD-10-CM | POA: Diagnosis not present

## 2024-04-09 DIAGNOSIS — R600 Localized edema: Secondary | ICD-10-CM | POA: Diagnosis not present

## 2024-04-09 DIAGNOSIS — M79605 Pain in left leg: Secondary | ICD-10-CM | POA: Diagnosis not present

## 2024-06-10 DIAGNOSIS — R5383 Other fatigue: Secondary | ICD-10-CM | POA: Diagnosis not present

## 2024-06-10 DIAGNOSIS — E559 Vitamin D deficiency, unspecified: Secondary | ICD-10-CM | POA: Diagnosis not present

## 2024-06-17 DIAGNOSIS — K219 Gastro-esophageal reflux disease without esophagitis: Secondary | ICD-10-CM | POA: Diagnosis not present

## 2024-06-17 DIAGNOSIS — F411 Generalized anxiety disorder: Secondary | ICD-10-CM | POA: Diagnosis not present

## 2024-06-17 DIAGNOSIS — F331 Major depressive disorder, recurrent, moderate: Secondary | ICD-10-CM | POA: Diagnosis not present

## 2024-06-17 DIAGNOSIS — E559 Vitamin D deficiency, unspecified: Secondary | ICD-10-CM | POA: Diagnosis not present

## 2024-06-17 DIAGNOSIS — G4700 Insomnia, unspecified: Secondary | ICD-10-CM | POA: Diagnosis not present

## 2024-08-05 DIAGNOSIS — L814 Other melanin hyperpigmentation: Secondary | ICD-10-CM | POA: Diagnosis not present

## 2024-08-05 DIAGNOSIS — D225 Melanocytic nevi of trunk: Secondary | ICD-10-CM | POA: Diagnosis not present

## 2024-08-05 DIAGNOSIS — L821 Other seborrheic keratosis: Secondary | ICD-10-CM | POA: Diagnosis not present

## 2024-08-05 DIAGNOSIS — L570 Actinic keratosis: Secondary | ICD-10-CM | POA: Diagnosis not present

## 2024-09-23 ENCOUNTER — Other Ambulatory Visit
